# Patient Record
Sex: Male | Born: 1950 | Race: White | Hispanic: No | Marital: Married | Smoking: Former smoker
Health system: Southern US, Community
[De-identification: ages and names within clinical notes are randomized; demographics above are authoritative.]

## PROBLEM LIST (undated history)

## (undated) DIAGNOSIS — I739 Peripheral vascular disease, unspecified: Secondary | ICD-10-CM

## (undated) DIAGNOSIS — C4491 Basal cell carcinoma of skin, unspecified: Secondary | ICD-10-CM

## (undated) DIAGNOSIS — G629 Polyneuropathy, unspecified: Secondary | ICD-10-CM

## (undated) DIAGNOSIS — F32A Depression, unspecified: Secondary | ICD-10-CM

## (undated) DIAGNOSIS — I10 Essential (primary) hypertension: Secondary | ICD-10-CM

## (undated) DIAGNOSIS — F329 Major depressive disorder, single episode, unspecified: Secondary | ICD-10-CM

## (undated) DIAGNOSIS — A419 Sepsis, unspecified organism: Secondary | ICD-10-CM

## (undated) DIAGNOSIS — Z85828 Personal history of other malignant neoplasm of skin: Secondary | ICD-10-CM

## (undated) DIAGNOSIS — C439 Malignant melanoma of skin, unspecified: Secondary | ICD-10-CM

## (undated) DIAGNOSIS — C61 Malignant neoplasm of prostate: Secondary | ICD-10-CM

## (undated) DIAGNOSIS — C4490 Unspecified malignant neoplasm of skin, unspecified: Secondary | ICD-10-CM

## (undated) DIAGNOSIS — R51 Headache: Secondary | ICD-10-CM

## (undated) DIAGNOSIS — G2581 Restless legs syndrome: Secondary | ICD-10-CM

## (undated) DIAGNOSIS — K819 Cholecystitis, unspecified: Secondary | ICD-10-CM

## (undated) DIAGNOSIS — F419 Anxiety disorder, unspecified: Secondary | ICD-10-CM

## (undated) DIAGNOSIS — E785 Hyperlipidemia, unspecified: Secondary | ICD-10-CM

## (undated) DIAGNOSIS — E1151 Type 2 diabetes mellitus with diabetic peripheral angiopathy without gangrene: Secondary | ICD-10-CM

## (undated) DIAGNOSIS — I2510 Atherosclerotic heart disease of native coronary artery without angina pectoris: Secondary | ICD-10-CM

## (undated) DIAGNOSIS — N200 Calculus of kidney: Secondary | ICD-10-CM

## (undated) DIAGNOSIS — E039 Hypothyroidism, unspecified: Secondary | ICD-10-CM

## (undated) DIAGNOSIS — C801 Malignant (primary) neoplasm, unspecified: Secondary | ICD-10-CM

## (undated) HISTORY — DX: Personal history of other malignant neoplasm of skin: Z85.828

## (undated) HISTORY — DX: Hypothyroidism, unspecified: E03.9

## (undated) HISTORY — DX: Essential (primary) hypertension: I10

## (undated) HISTORY — DX: Major depressive disorder, single episode, unspecified: F32.9

## (undated) HISTORY — DX: Peripheral vascular disease, unspecified: I73.9

## (undated) HISTORY — DX: Basal cell carcinoma of skin, unspecified: C44.91

## (undated) HISTORY — DX: Sepsis, unspecified organism: A41.9

## (undated) HISTORY — DX: Depression, unspecified: F32.A

## (undated) HISTORY — DX: Malignant melanoma of skin, unspecified: C43.9

## (undated) HISTORY — DX: Type 2 diabetes mellitus with diabetic peripheral angiopathy without gangrene: E11.51

## (undated) HISTORY — DX: Calculus of kidney: N20.0

## (undated) HISTORY — DX: Hyperlipidemia, unspecified: E78.5

---

## 1995-05-02 HISTORY — PX: EXCISIONAL HEMORRHOIDECTOMY: SHX1541

## 1998-05-01 HISTORY — PX: HEMORROIDECTOMY: SUR656

## 1998-05-01 HISTORY — PX: BASAL CELL CARCINOMA EXCISION: SHX1214

## 2000-06-29 ENCOUNTER — Ambulatory Visit (HOSPITAL_BASED_OUTPATIENT_CLINIC_OR_DEPARTMENT_OTHER): Admission: RE | Admit: 2000-06-29 | Discharge: 2000-06-29 | Payer: Self-pay | Admitting: General Surgery

## 2003-05-05 LAB — HM COLONOSCOPY

## 2006-06-08 ENCOUNTER — Encounter: Admission: RE | Admit: 2006-06-08 | Discharge: 2006-06-08 | Payer: Self-pay | Admitting: *Deleted

## 2011-07-06 ENCOUNTER — Ambulatory Visit (INDEPENDENT_AMBULATORY_CARE_PROVIDER_SITE_OTHER): Payer: Medicare Other | Admitting: Physician Assistant

## 2011-07-06 ENCOUNTER — Encounter: Payer: Self-pay | Admitting: Physician Assistant

## 2011-07-06 ENCOUNTER — Encounter: Payer: Self-pay | Admitting: *Deleted

## 2011-07-06 DIAGNOSIS — R079 Chest pain, unspecified: Secondary | ICD-10-CM

## 2011-07-06 DIAGNOSIS — R9439 Abnormal result of other cardiovascular function study: Secondary | ICD-10-CM

## 2011-07-06 DIAGNOSIS — I10 Essential (primary) hypertension: Secondary | ICD-10-CM

## 2011-07-06 LAB — PROTIME-INR: INR: 0.9 ratio (ref 0.8–1.0)

## 2011-07-06 LAB — BASIC METABOLIC PANEL
BUN: 18 mg/dL (ref 6–23)
Calcium: 9.1 mg/dL (ref 8.4–10.5)
Chloride: 101 mEq/L (ref 96–112)
Creatinine, Ser: 1.7 mg/dL — ABNORMAL HIGH (ref 0.4–1.5)
Glucose, Bld: 111 mg/dL — ABNORMAL HIGH (ref 70–99)

## 2011-07-06 MED ORDER — NITROGLYCERIN 0.4 MG SL SUBL: 0.4000 mg | SUBLINGUAL_TABLET | SUBLINGUAL | Status: DC | PRN

## 2011-07-06 MED ORDER — METOPROLOL TARTRATE 25 MG PO TABS: 25.0000 mg | ORAL_TABLET | Freq: Two times a day (BID) | ORAL | Status: DC

## 2011-07-06 NOTE — Progress Notes (Addendum)
[address] [place], [place]  [address] Phone: [phone] Fax:  [phone]  Date:  07/06/2011   Name:  Dean Cole       DOB:  05/27/1950 MRN:  [id number]  PCP:  Dr. Arthur Green Primary Cardiologist:  New to Dr.  Thomas Stuckey  Primary Electrophysiologist:  None    History of Present Illness: Dean Cole is a 60 y.o. male who is referred for ETT by his PCP.  He has a h/o HTN, hypothyroidism, borderline diabetes.  He is an ex-smoker.  FHx notable for CAD with father having "stents" put in in his 60s.  Notes chest pain for years.  Has had chest pain and dyspnea with exertion for the last couple of months.  No syncope.  No orthopnea, PND or edema.     Past Medical History  Diagnosis Date  . HTN (hypertension)   . Hypothyroidism   . Borderline diabetes     Current Outpatient Prescriptions  Medication Sig Dispense Refill  . aspirin 81 MG tablet Take 81 mg by mouth daily.      . diazepam (VALIUM) 5 MG tablet Take 5 mg by mouth every 6 (six) hours as needed.      . imipramine (TOFRANIL) 25 MG tablet Take 75 mg by mouth at bedtime.      . levothyroxine (SYNTHROID, LEVOTHROID) 150 MCG tablet Take 150 mcg by mouth daily.      . pentazocine-naloxone (TALWIN NX) 50-0.5 MG per tablet Take 1 tablet by mouth every 6 (six) hours as needed.      . telmisartan-hydrochlorothiazide (MICARDIS HCT) 80-12.5 MG per tablet Take 1 tablet by mouth daily.      . terazosin (HYTRIN) 5 MG capsule Take 5 mg by mouth daily.         Allergies: Allergies no known allergies   History  Substance Use Topics  . Smoking status: Former Smoker  . Smokeless tobacco: Not on file  . Alcohol Use: Yes     "mixed drink every now and then"     Family History  Problem Relation Age of Onset  . Coronary artery disease Father   . Coronary artery disease Paternal Grandfather      ROS:  Please see the history of present illness.    All other systems reviewed and negative.    PHYSICAL EXAM: VS:  BP 102/67  Pulse 110  Resp 12 Well nourished, well developed, in no acute distress HEENT: normal Neck: no JVD at 90 degrees Vascular: no carotid bruits Cardiac:  normal S1, S2; RRR; no murmur Lungs:  clear to auscultation bilaterally, no wheezing, rhonchi or rales Abd: soft, nontender  Ext: no edema Skin: warm and dry Neuro:  CNs 2-12 intact, no focal abnormalities noted Psych: normal affect  EKG:  Sinus tach, HR 110, norma axis, no acute changes   Exercise Treadmill Test  Pre-Exercise Testing Evaluation Rhythm: sinus tachycardia  Rate:110 PR:  .15 QRS:  .09 QT:  .33 QTc: .45   Test  Exercise Tolerance Test Ordering MD: Arthur Green, MD  Interpreting MD:  Samyrah Bruster PA-C Unique Test No: 1  Treadmill:  1 Indication for ETT: chest pain - rule out ischemia  Contraindication to ETT: No  Stress Modality: exercise - treadmill  Cardiac Imaging Performed: non  Protocol: standard Bruce - maximal  Max BP:  134/48 Max MPHR (bpm):  160 85% MPR (bpm):  136 MPHR obtained (bpm):  146 % MPHR obtained:  91%   Reached 85% MPHR (min:sec):  3:50 Total Exercise Time (min-sec):  7:00 Workload in METS:  8.5 Borg Scale: 15 Reason ETT Terminated:  desired heart rate attained   ST Segment Analysis At Rest: normal ST segments - no evidence of significant ST depression With Exercise: significant ischemic ST depression  Other Information Arrhythmia:  No Angina during ETT:  present (1) Quality of ETT:  diagnostic  ETT Interpretation:  abnormal - evidence of ST depression consistent with ischemia  Comments: Good exercise tolerance. He did complain of chest pain. Normal BP response to exercise. Significant ST depression (3mm) consistent with ischemia.   Recommendations: Recommend cardiac catheterization.   ASSESSMENT AND PLAN:  1. Chest pain  He has significant ST segment depression indicative of ischemia.  Likelihood of obstructive CAD is high.  Symptoms  stable.  Discussed with Dr.  Thomas Stuckey (DOD).  We recommend cardiac catheterization.  The procedure was explained in detail to the patient.  Risks and benefits of cardiac catheterization have been discussed with the patient.  These include bleeding, infection, kidney damage, stroke, heart attack, death.  The patient understands these risks and is willing to proceed.  He will continue ASA.  Stop Hytrin.  Start Metoprolol 25 mg bid.  I will give him a Rx for NTG prn.  The procedure will be arranged for tomorrow.  He knows to call 911 if he has unstable symptoms.   2. Abnormal stress test  Arrange cardiac catheterization as noted above.   3. HTN (hypertension)  Stop Hytrin and add metoprolol as noted.     Signed, Hani Patnode, PA-C  12:43 PM 07/06/2011    

## 2011-07-06 NOTE — Patient Instructions (Addendum)
Your physician has requested that you have a cardiac catheterization LEFT HEART CATH 07/07/11 WITH DR. Clifton James @ 10:30 AM. Cardiac catheterization is used to diagnose and/or treat various heart conditions. Doctors may recommend this procedure for a number of different reasons. The most common reason is to evaluate chest pain. Chest pain can be a symptom of coronary artery disease (CAD), and cardiac catheterization can show whether plaque is narrowing or blocking your heart's arteries. This procedure is also used to evaluate the valves, as well as measure the blood flow and oxygen levels in different parts of your heart. For further information please visit [email or web address] Please follow instruction sheet, as given.  Your physician recommends that you return for lab work in: STAT PRE CATH LABS BMET, CB W/DIFF, PT/INR 786.50  Your physician has recommended you make the following change in your medication: STOP HYTRIN;  START METOPROLOL 25 MG 1 TABLET TWICE DAILY A PRESCRIPTION HAS BEEN SENT TO THE PHARMACY TODAY FOR YOU

## 2011-07-06 NOTE — H&P (Signed)
History and Physical   Date: 07/06/2011   Name: Dean Cole  DOB: [birth year]  MRN: [id number]   PCP: Dr. Murray Hodgkins  Primary Cardiologist: New to Dr. Shawnie Pons  Primary Electrophysiologist: None   History of Present Illness:  Dean Cole is a 61 y.o. male who is referred for ETT by his PCP. He has a h/o HTN, hypothyroidism, borderline diabetes. He is an ex-smoker. FHx notable for CAD with father having "stents" put in in his 21s. Notes chest pain for years. Has had chest pain and dyspnea with exertion for the last couple of months. No syncope. No orthopnea, PND or edema.    Past Medical History   Diagnosis  Date   .  HTN (hypertension)    .  Hypothyroidism    .  Borderline diabetes      Current Outpatient Prescriptions   Medication  Sig  Dispense  Refill   .  aspirin 81 MG tablet  Take 81 mg by mouth daily.     .  diazepam (VALIUM) 5 MG tablet  Take 5 mg by mouth every 6 (six) hours as needed.     Marland Kitchen  imipramine (TOFRANIL) 25 MG tablet  Take 75 mg by mouth at bedtime.     Marland Kitchen  levothyroxine (SYNTHROID, LEVOTHROID) 150 MCG tablet  Take 150 mcg by mouth daily.     .  pentazocine-naloxone (TALWIN NX) 50-0.5 MG per tablet  Take 1 tablet by mouth every 6 (six) hours as needed.     Marland Kitchen  telmisartan-hydrochlorothiazide (MICARDIS HCT) 80-12.5 MG per tablet  Take 1 tablet by mouth daily.     Marland Kitchen  terazosin (HYTRIN) 5 MG capsule  Take 5 mg by mouth daily.       Allergies:  Allergies no known allergies    History   Substance Use Topics   .  Smoking status:  Former Games developer   .  Smokeless tobacco:  Not on file   .  Alcohol Use:  Yes      "mixed drink every now and then"     Family History   Problem  Relation  Age of Onset   .  Coronary artery disease  Father    .  Coronary artery disease  Paternal Grandfather      ROS: Please see the history of present illness. All other systems reviewed and negative.   PHYSICAL EXAM:  VS: BP 102/67  Pulse 110  Resp 12  Well  nourished, well developed, in no acute distress  HEENT: normal  Neck: no JVD at 90 degrees  Vascular: no carotid bruits  Cardiac: normal S1, S2; RRR; no murmur  Lungs: clear to auscultation bilaterally, no wheezing, rhonchi or rales  Abd: soft, nontender  Ext: no edema  Skin: warm and dry  Neuro: CNs 2-12 intact, no focal abnormalities noted  Psych: normal affect   EKG: Sinus tach, HR 110, norma axis, no acute changes   Exercise Treadmill Test  Pre-Exercise Testing Evaluation  Rhythm: sinus tachycardia Rate:110  PR: .15 QRS: .09  QT: .33 QTc: .45   Test   Exercise Tolerance Test  Ordering MD: Murray Hodgkins, MD Interpreting MD: Tereso Newcomer PA-C  Unique Test No: 1 Treadmill: 1  Indication for ETT: chest pain - rule out ischemia Contraindication to ETT: No  Stress Modality: exercise - treadmill Cardiac Imaging Performed: non  Protocol: standard Bruce - maximal Max BP: 134/48  Max MPHR (bpm): 160 85% MPR (bpm): 136  MPHR  obtained (bpm): 146 % MPHR obtained: 91%  Reached 85% MPHR (min:sec): 3:50 Total Exercise Time (min-sec): 7:00  Workload in METS: 8.5 Borg Scale: 15  Reason ETT Terminated: desired heart rate attained   ST Segment Analysis  At Rest: normal ST segments - no evidence of significant ST depression  With Exercise: significant ischemic ST depression   Other Information  Arrhythmia: No Angina during ETT: present (1)   Quality of ETT: diagnostic   ETT Interpretation: abnormal - evidence of ST depression consistent with ischemia   Comments:  Good exercise tolerance.  He did complain of chest pain.  Normal BP response to exercise.  Significant ST depression consistent with ischemia.   Recommendations:  Recommend cardiac catheterization.    ASSESSMENT AND PLAN:  1.  Chest pain  He has significant ST segment depression indicative of ischemia. Likelihood of obstructive CAD is high. Symptoms stable. Discussed with Dr. Shawnie Pons (DOD). We recommend cardiac  catheterization. The procedure was explained in detail to the patient. Risks and benefits of cardiac catheterization have been discussed with the patient. These include bleeding, infection, kidney damage, stroke, heart attack, death. The patient understands these risks and is willing to proceed. He will continue ASA. Stop Hytrin. Start Metoprolol 25 mg bid. I will give him a Rx for NTG prn. The procedure will be arranged for tomorrow. He knows to call 911 if he has unstable symptoms.    2.  Abnormal stress test  Arrange cardiac catheterization as noted above.   3.  HTN (hypertension)  Stop Hytrin and add metoprolol as noted.     Signed,  Tereso Newcomer, PA-C 12:43 PM 07/06/2011   I have reviewed this H and P today. I have personally examined the patient here in the cath lab.  I agree with plans as outlined.   Neema Barreira 12:36 PM 07/07/2011

## 2011-07-07 ENCOUNTER — Inpatient Hospital Stay (HOSPITAL_COMMUNITY): Payer: Medicare Other

## 2011-07-07 ENCOUNTER — Other Ambulatory Visit: Payer: Self-pay

## 2011-07-07 ENCOUNTER — Encounter (HOSPITAL_COMMUNITY): Payer: Self-pay | Admitting: General Practice

## 2011-07-07 ENCOUNTER — Encounter (HOSPITAL_COMMUNITY)
Admission: RE | Disposition: A | Discharge status: Home / Self Care | Payer: Self-pay | Source: Ambulatory Visit | Attending: Cardiothoracic Surgery

## 2011-07-07 ENCOUNTER — Inpatient Hospital Stay (HOSPITAL_COMMUNITY)
Admission: RE | Admit: 2011-07-07 | Discharge: 2011-07-15 | DRG: 234 | Disposition: A | Discharge status: Home / Self Care | Payer: Medicare Other | Source: Ambulatory Visit | Attending: Cardiothoracic Surgery | Admitting: Cardiothoracic Surgery

## 2011-07-07 DIAGNOSIS — E669 Obesity, unspecified: Secondary | ICD-10-CM | POA: Diagnosis present

## 2011-07-07 DIAGNOSIS — Z79899 Other long term (current) drug therapy: Secondary | ICD-10-CM

## 2011-07-07 DIAGNOSIS — I2510 Atherosclerotic heart disease of native coronary artery without angina pectoris: Secondary | ICD-10-CM

## 2011-07-07 DIAGNOSIS — E039 Hypothyroidism, unspecified: Secondary | ICD-10-CM | POA: Diagnosis present

## 2011-07-07 DIAGNOSIS — N289 Disorder of kidney and ureter, unspecified: Secondary | ICD-10-CM | POA: Diagnosis not present

## 2011-07-07 DIAGNOSIS — Z8249 Family history of ischemic heart disease and other diseases of the circulatory system: Secondary | ICD-10-CM

## 2011-07-07 DIAGNOSIS — I471 Supraventricular tachycardia, unspecified: Secondary | ICD-10-CM | POA: Diagnosis not present

## 2011-07-07 DIAGNOSIS — E785 Hyperlipidemia, unspecified: Secondary | ICD-10-CM | POA: Diagnosis present

## 2011-07-07 DIAGNOSIS — I200 Unstable angina: Secondary | ICD-10-CM | POA: Diagnosis present

## 2011-07-07 DIAGNOSIS — Y832 Surgical operation with anastomosis, bypass or graft as the cause of abnormal reaction of the patient, or of later complication, without mention of misadventure at the time of the procedure: Secondary | ICD-10-CM | POA: Diagnosis not present

## 2011-07-07 DIAGNOSIS — G2581 Restless legs syndrome: Secondary | ICD-10-CM | POA: Diagnosis present

## 2011-07-07 DIAGNOSIS — Z7982 Long term (current) use of aspirin: Secondary | ICD-10-CM

## 2011-07-07 DIAGNOSIS — E119 Type 2 diabetes mellitus without complications: Secondary | ICD-10-CM | POA: Diagnosis present

## 2011-07-07 DIAGNOSIS — K929 Disease of digestive system, unspecified: Secondary | ICD-10-CM | POA: Diagnosis not present

## 2011-07-07 DIAGNOSIS — E8779 Other fluid overload: Secondary | ICD-10-CM | POA: Diagnosis not present

## 2011-07-07 DIAGNOSIS — K560 Paralytic ileus: Secondary | ICD-10-CM | POA: Diagnosis not present

## 2011-07-07 DIAGNOSIS — K219 Gastro-esophageal reflux disease without esophagitis: Secondary | ICD-10-CM | POA: Diagnosis present

## 2011-07-07 DIAGNOSIS — Z87891 Personal history of nicotine dependence: Secondary | ICD-10-CM

## 2011-07-07 DIAGNOSIS — D696 Thrombocytopenia, unspecified: Secondary | ICD-10-CM | POA: Diagnosis not present

## 2011-07-07 DIAGNOSIS — D62 Acute posthemorrhagic anemia: Secondary | ICD-10-CM | POA: Diagnosis not present

## 2011-07-07 DIAGNOSIS — I10 Essential (primary) hypertension: Secondary | ICD-10-CM | POA: Diagnosis present

## 2011-07-07 DIAGNOSIS — Y9210 Unspecified residential institution as the place of occurrence of the external cause: Secondary | ICD-10-CM | POA: Diagnosis not present

## 2011-07-07 DIAGNOSIS — K5900 Constipation, unspecified: Secondary | ICD-10-CM | POA: Diagnosis not present

## 2011-07-07 DIAGNOSIS — I119 Hypertensive heart disease without heart failure: Secondary | ICD-10-CM | POA: Insufficient documentation

## 2011-07-07 HISTORY — PX: LEFT HEART CATHETERIZATION WITH CORONARY ANGIOGRAM: SHX5451

## 2011-07-07 HISTORY — DX: Atherosclerotic heart disease of native coronary artery without angina pectoris: I25.10

## 2011-07-07 HISTORY — DX: Headache: R51

## 2011-07-07 HISTORY — DX: Malignant (primary) neoplasm, unspecified: C80.1

## 2011-07-07 HISTORY — PX: CARDIAC CATHETERIZATION: SHX172

## 2011-07-07 HISTORY — DX: Restless legs syndrome: G25.81

## 2011-07-07 LAB — PULMONARY FUNCTION TEST

## 2011-07-07 LAB — BASIC METABOLIC PANEL
CO2: 27 mEq/L (ref 19–32)
Calcium: 8 mg/dL — ABNORMAL LOW (ref 8.4–10.5)
Chloride: 108 mEq/L (ref 96–112)
Glucose, Bld: 105 mg/dL — ABNORMAL HIGH (ref 70–99)
Sodium: 141 mEq/L (ref 135–145)

## 2011-07-07 LAB — CBC
HCT: 35.6 % — ABNORMAL LOW (ref 39.0–52.0)
Hemoglobin: 12.1 g/dL — ABNORMAL LOW (ref 13.0–17.0)
MCH: 30.7 pg (ref 26.0–34.0)
MCV: 90.4 fL (ref 78.0–100.0)
RBC: 3.94 MIL/uL — ABNORMAL LOW (ref 4.22–5.81)

## 2011-07-07 LAB — GLUCOSE, CAPILLARY: Glucose-Capillary: 106 mg/dL — ABNORMAL HIGH (ref 70–99)

## 2011-07-07 LAB — POCT ACTIVATED CLOTTING TIME: Activated Clotting Time: 144 seconds

## 2011-07-07 SURGERY — LEFT HEART CATHETERIZATION WITH CORONARY ANGIOGRAM
Anesthesia: LOCAL

## 2011-07-07 MED ORDER — ASPIRIN 81 MG PO TABS: 81.0000 mg | ORAL_TABLET | Freq: Every day | ORAL | Status: DC

## 2011-07-07 MED ORDER — HEPARIN SODIUM (PORCINE) 1000 UNIT/ML IJ SOLN
INTRAMUSCULAR | Status: AC
  Filled 2011-07-07: qty 1

## 2011-07-07 MED ORDER — ACETAMINOPHEN 325 MG PO TABS
650.0000 mg | ORAL_TABLET | ORAL | Status: DC | PRN
  Administered 2011-07-08: 650 mg via ORAL
  Filled 2011-07-07: qty 2

## 2011-07-07 MED ORDER — MORPHINE SULFATE 2 MG/ML IJ SOLN: 1.0000 mg | INTRAMUSCULAR | Status: DC | PRN

## 2011-07-07 MED ORDER — METOPROLOL TARTRATE 25 MG PO TABS
25.0000 mg | ORAL_TABLET | Freq: Two times a day (BID) | ORAL | Status: DC
  Administered 2011-07-07 – 2011-07-09 (×5): 25 mg via ORAL
  Filled 2011-07-07 (×8): qty 1

## 2011-07-07 MED ORDER — ASPIRIN 81 MG PO CHEW
CHEWABLE_TABLET | ORAL | Status: AC
  Filled 2011-07-07: qty 4

## 2011-07-07 MED ORDER — SODIUM CHLORIDE 0.9 % IJ SOLN: 3.0000 mL | Freq: Two times a day (BID) | INTRAMUSCULAR | Status: DC

## 2011-07-07 MED ORDER — OXYCODONE-ACETAMINOPHEN 5-325 MG PO TABS: 1.0000 | ORAL_TABLET | ORAL | Status: DC | PRN

## 2011-07-07 MED ORDER — MIDAZOLAM HCL 2 MG/2ML IJ SOLN
INTRAMUSCULAR | Status: AC
  Filled 2011-07-07: qty 2

## 2011-07-07 MED ORDER — ASPIRIN 81 MG PO CHEW
324.0000 mg | CHEWABLE_TABLET | ORAL | Status: DC
  Administered 2011-07-07: 324 mg via ORAL

## 2011-07-07 MED ORDER — LEVOTHYROXINE SODIUM 150 MCG PO TABS
150.0000 ug | ORAL_TABLET | Freq: Every day | ORAL | Status: DC
  Administered 2011-07-08 – 2011-07-15 (×7): 150 ug via ORAL
  Filled 2011-07-07 (×10): qty 1

## 2011-07-07 MED ORDER — NITROGLYCERIN 0.2 MG/ML ON CALL CATH LAB
INTRAVENOUS | Status: AC
  Filled 2011-07-07: qty 1

## 2011-07-07 MED ORDER — ONDANSETRON HCL 4 MG/2ML IJ SOLN: 4.0000 mg | Freq: Four times a day (QID) | INTRAMUSCULAR | Status: DC | PRN

## 2011-07-07 MED ORDER — SODIUM CHLORIDE 0.9 % IV SOLN
INTRAVENOUS | Status: DC
  Administered 2011-07-07: 09:00:00 via INTRAVENOUS

## 2011-07-07 MED ORDER — IRBESARTAN 300 MG PO TABS
300.0000 mg | ORAL_TABLET | Freq: Every day | ORAL | Status: DC
  Administered 2011-07-08 – 2011-07-09 (×2): 300 mg via ORAL
  Filled 2011-07-07 (×3): qty 1

## 2011-07-07 MED ORDER — TELMISARTAN-HCTZ 80-12.5 MG PO TABS: 1.0000 | ORAL_TABLET | Freq: Every day | ORAL | Status: DC

## 2011-07-07 MED ORDER — NITROGLYCERIN 0.4 MG SL SUBL: 0.4000 mg | SUBLINGUAL_TABLET | SUBLINGUAL | Status: DC | PRN

## 2011-07-07 MED ORDER — POLYETHYLENE GLYCOL 3350 17 G PO PACK
17.0000 g | PACK | Freq: Every day | ORAL | Status: DC | PRN
  Administered 2011-07-09: 17 g via ORAL
  Filled 2011-07-07 (×4): qty 1

## 2011-07-07 MED ORDER — ATORVASTATIN CALCIUM 80 MG PO TABS
80.0000 mg | ORAL_TABLET | Freq: Every day | ORAL | Status: DC
  Administered 2011-07-07 – 2011-07-11 (×5): 80 mg via ORAL
  Filled 2011-07-07 (×7): qty 1

## 2011-07-07 MED ORDER — IMIPRAMINE HCL 50 MG PO TABS
75.0000 mg | ORAL_TABLET | Freq: Every day | ORAL | Status: DC
  Filled 2011-07-07: qty 1

## 2011-07-07 MED ORDER — PENTAZOCINE-NALOXONE 50-0.5 MG PO TABS: 1.0000 | ORAL_TABLET | Freq: Two times a day (BID) | ORAL | Status: DC

## 2011-07-07 MED ORDER — DIAZEPAM 5 MG PO TABS
5.0000 mg | ORAL_TABLET | Freq: Four times a day (QID) | ORAL | Status: DC
  Administered 2011-07-07 – 2011-07-09 (×6): 5 mg via ORAL
  Filled 2011-07-07 (×6): qty 1

## 2011-07-07 MED ORDER — FENTANYL CITRATE 0.05 MG/ML IJ SOLN
INTRAMUSCULAR | Status: AC
  Filled 2011-07-07: qty 2

## 2011-07-07 MED ORDER — SODIUM CHLORIDE 0.9 % IJ SOLN: 3.0000 mL | INTRAMUSCULAR | Status: DC | PRN

## 2011-07-07 MED ORDER — ASPIRIN EC 81 MG PO TBEC
81.0000 mg | DELAYED_RELEASE_TABLET | Freq: Every day | ORAL | Status: DC
  Administered 2011-07-08 – 2011-07-09 (×2): 81 mg via ORAL
  Filled 2011-07-07 (×3): qty 1

## 2011-07-07 MED ORDER — HEPARIN (PORCINE) IN NACL 2-0.9 UNIT/ML-% IJ SOLN
INTRAMUSCULAR | Status: AC
  Filled 2011-07-07: qty 2000

## 2011-07-07 MED ORDER — SODIUM CHLORIDE 0.9 % IV SOLN: INTRAVENOUS | Status: AC

## 2011-07-07 MED ORDER — PENTAZOCINE-NALOXONE HCL 50-0.5 MG PO TABS
1.0000 | ORAL_TABLET | Freq: Two times a day (BID) | ORAL | Status: DC
  Administered 2011-07-07 – 2011-07-09 (×5): 1 via ORAL
  Filled 2011-07-07 (×5): qty 1

## 2011-07-07 MED ORDER — HYDROCHLOROTHIAZIDE 12.5 MG PO CAPS
12.5000 mg | ORAL_CAPSULE | Freq: Every day | ORAL | Status: DC
  Administered 2011-07-08 – 2011-07-09 (×2): 12.5 mg via ORAL
  Filled 2011-07-07 (×3): qty 1

## 2011-07-07 MED ORDER — LIDOCAINE HCL (PF) 1 % IJ SOLN
INTRAMUSCULAR | Status: AC
  Filled 2011-07-07: qty 30

## 2011-07-07 MED ORDER — SODIUM CHLORIDE 0.9 % IV SOLN: 250.0000 mL | INTRAVENOUS | Status: DC | PRN

## 2011-07-07 NOTE — H&P (View-Only) (Signed)
[address] [place], [place]  [address] Phone: [phone] Fax:  [phone]  Date:  07/06/2011   Name:  Dean Cole       DOB:  [birth year] MRN:  [id number]  PCP:  Dr. Murray Hodgkins Primary Cardiologist:  New to Dr.  Shawnie Pons  Primary Electrophysiologist:  None    History of Present Illness: Dean Cole is a 61 y.o. male who is referred for ETT by his PCP.  He has a h/o HTN, hypothyroidism, borderline diabetes.  He is an ex-smoker.  FHx notable for CAD with father having "stents" put in in his 61s.  Notes chest pain for years.  Has had chest pain and dyspnea with exertion for the last couple of months.  No syncope.  No orthopnea, PND or edema.     Past Medical History  Diagnosis Date  . HTN (hypertension)   . Hypothyroidism   . Borderline diabetes     Current Outpatient Prescriptions  Medication Sig Dispense Refill  . aspirin 81 MG tablet Take 81 mg by mouth daily.      . diazepam (VALIUM) 5 MG tablet Take 5 mg by mouth every 6 (six) hours as needed.      Marland Kitchen imipramine (TOFRANIL) 25 MG tablet Take 75 mg by mouth at bedtime.      Marland Kitchen levothyroxine (SYNTHROID, LEVOTHROID) 150 MCG tablet Take 150 mcg by mouth daily.      . pentazocine-naloxone (TALWIN NX) 50-0.5 MG per tablet Take 1 tablet by mouth every 6 (six) hours as needed.      Marland Kitchen telmisartan-hydrochlorothiazide (MICARDIS HCT) 80-12.5 MG per tablet Take 1 tablet by mouth daily.      Marland Kitchen terazosin (HYTRIN) 5 MG capsule Take 5 mg by mouth daily.         Allergies: Allergies no known allergies   History  Substance Use Topics  . Smoking status: Former Games developer  . Smokeless tobacco: Not on file  . Alcohol Use: Yes     "mixed drink every now and then"     Family History  Problem Relation Age of Onset  . Coronary artery disease Father   . Coronary artery disease Paternal Grandfather      ROS:  Please see the history of present illness.    All other systems reviewed and negative.    PHYSICAL EXAM: VS:  BP 102/67  Pulse 110  Resp 12 Well nourished, well developed, in no acute distress HEENT: normal Neck: no JVD at 90 degrees Vascular: no carotid bruits Cardiac:  normal S1, S2; RRR; no murmur Lungs:  clear to auscultation bilaterally, no wheezing, rhonchi or rales Abd: soft, nontender  Ext: no edema Skin: warm and dry Neuro:  CNs 2-12 intact, no focal abnormalities noted Psych: normal affect  EKG:  Sinus tach, HR 110, norma axis, no acute changes   Exercise Treadmill Test  Pre-Exercise Testing Evaluation Rhythm: sinus tachycardia  Rate:110 PR:  .15 QRS:  .09 QT:  .33 QTc: .45   Test  Exercise Tolerance Test Ordering MD: Murray Hodgkins, MD  Interpreting MD:  Tereso Newcomer PA-C Unique Test No: 1  Treadmill:  1 Indication for ETT: chest pain - rule out ischemia  Contraindication to ETT: No  Stress Modality: exercise - treadmill  Cardiac Imaging Performed: non  Protocol: standard Bruce - maximal  Max BP:  134/48 Max MPHR (bpm):  160 85% MPR (bpm):  136 MPHR obtained (bpm):  146 % MPHR obtained:  91%  Reached 85% MPHR (min:sec):  3:50 Total Exercise Time (min-sec):  7:00 Workload in METS:  8.5 Borg Scale: 15 Reason ETT Terminated:  desired heart rate attained   ST Segment Analysis At Rest: normal ST segments - no evidence of significant ST depression With Exercise: significant ischemic ST depression  Other Information Arrhythmia:  No Angina during ETT:  present (1) Quality of ETT:  diagnostic  ETT Interpretation:  abnormal - evidence of ST depression consistent with ischemia  Comments: Good exercise tolerance. He did complain of chest pain. Normal BP response to exercise. Significant ST depression (3mm) consistent with ischemia.   Recommendations: Recommend cardiac catheterization.   ASSESSMENT AND PLAN:  1. Chest pain  He has significant ST segment depression indicative of ischemia.  Likelihood of obstructive CAD is high.  Symptoms  stable.  Discussed with Dr.  Shawnie Pons (DOD).  We recommend cardiac catheterization.  The procedure was explained in detail to the patient.  Risks and benefits of cardiac catheterization have been discussed with the patient.  These include bleeding, infection, kidney damage, stroke, heart attack, death.  The patient understands these risks and is willing to proceed.  He will continue ASA.  Stop Hytrin.  Start Metoprolol 25 mg bid.  I will give him a Rx for NTG prn.  The procedure will be arranged for tomorrow.  He knows to call 911 if he has unstable symptoms.   2. Abnormal stress test  Arrange cardiac catheterization as noted above.   3. HTN (hypertension)  Stop Hytrin and add metoprolol as noted.     [name], PA-C  12:43 PM 07/06/2011

## 2011-07-07 NOTE — CV Procedure (Signed)
Cardiac Catheterization Operative Report  RECO SHONK [id number] 3/8/201312:17 PM GREEN, Lenon Curt, MD, MD  Procedure Performed:  1. Left Heart Catheterization 2. Selective Coronary Angiography 3. Left ventricular angiogram  Operator: Verne Carrow, MD  Arterial access site:  Right radial artery and right femoral artery. Could not engage left main from the radial access so we had to switch to femoral access.   Indication:  Unstable angina.                                      Procedure Details: The risks, benefits, complications, treatment options, and expected outcomes were discussed with the patient. The patient and/or family concurred with the proposed plan, giving informed consent. The patient was brought to the cath lab after IV hydration was begun and oral premedication was given. The patient was further sedated with Versed and Fentanyl. The right wrist was assessed with an Allens test which was positive. The right wrist was prepped and draped in a sterile fashion. 1% lidocaine was used for local anesthesia. Using the modified Seldinger access technique, a 5 French sheath was placed in the right radial artery. 3 mg Verapamil was given through the sheath. 4500 units IV heparin was given. A JR-4 catheter was used to engage the RCA. I was unable to engage the left main artery from the radial approach. I used a JL-3.5, TIG 4.0, EBU 3.5, Jacky, E rad left with no success. I then elected to gain access from the right femoral artery. The right groin had been prepped. 1% lidocaine was used for local anesthesia. A 5 French sheath was placed in the right femoral artery. I then used a JL-4 catheter to engage the left main artery.  A pigtail catheter was used to perform a left ventricular angiogram. The sheath was removed from the right radial artery and a Terumo hemostasis band was applied at the arteriotomy site on the right wrist.    There were no immediate complications. The  patient was taken to the recovery area in stable condition.   Hemodynamic Findings: Central aortic pressure: 122/74 Left ventricular pressure: 137/16/19  Angiographic Findings:  Left main:  Distal left main has a hazy, eccentric 90% stenosis.   Left Anterior Descending Artery: Large vessel that courses to the apex. Mild non-obstructive plaque throughout the proximal and mid vessel. The diagonal is moderate sized with mild plaque disease.   Circumflex Artery: Small to moderate sized vessel with ostial 70% stenosis. This supplies a moderate sized OM branch that has mild plaque disease.   Right Coronary Artery: Large dominant vessel with 99% mid stenosis. There is a long, tubular distal 90% stenosis. Just before a smaller caliber PDA branch there is a 70% stenosis. The PLA supplies a territory and has mild plaque disease.   Left Ventricular Angiogram: LVEF 50-55%. The inferior wall has mild hypokinesis.   Impression: 1.  Severe distal left main stenosis. 2. Severe disease mid and distal RCA 3. Preserved LV systolic function 4. Exertional angina  Recommendations: Will admit pt and consult CT surgery for possible CABG. I think bypass surgery will be the best revascularization strategy given his anatomy with distal left main stenosis, ostial Circumflex disease and severe RCA disease in several segments. Will continue ASA/beta blocker. Will start statin.        Complications:  None. The patient tolerated the procedure well.

## 2011-07-07 NOTE — Interval H&P Note (Signed)
History and Physical Interval Note:  07/07/2011 10:43 AM  Dean Cole  has presented today for cardiac cath with the diagnosis of Abnormal Stress Test  The various methods of treatment have been discussed with the patient and family. After consideration of risks, benefits and other options for treatment, the patient has consented to  Procedure(s) (LRB): LEFT HEART CATHETERIZATION WITH CORONARY ANGIOGRAM (N/A) as a surgical intervention .  The patients' history has been reviewed, patient examined, no change in status, stable for surgery.  I have reviewed the patients' chart and labs.  Questions were answered to the patient's satisfaction.     Brunella Wileman

## 2011-07-07 NOTE — Progress Notes (Signed)
Patient with class III angina,L main and 3 vessel CAD Full consult dictated # [phone] CABG Mon am Procedure risks and benefits d/w patient,wife  P. Zenaida Niece Trigt,MD

## 2011-07-07 NOTE — Progress Notes (Signed)
Contacted Lucile Crater, [place] regarding pt medications.  He told me he takes Tofranil 75mg  q HS and miralax q weekly.  Orders were received.

## 2011-07-08 ENCOUNTER — Inpatient Hospital Stay (HOSPITAL_COMMUNITY): Payer: Medicare Other

## 2011-07-08 ENCOUNTER — Encounter (HOSPITAL_COMMUNITY): Payer: Self-pay | Admitting: Nurse Practitioner

## 2011-07-08 DIAGNOSIS — I200 Unstable angina: Secondary | ICD-10-CM

## 2011-07-08 DIAGNOSIS — E039 Hypothyroidism, unspecified: Secondary | ICD-10-CM | POA: Insufficient documentation

## 2011-07-08 DIAGNOSIS — I319 Disease of pericardium, unspecified: Secondary | ICD-10-CM

## 2011-07-08 DIAGNOSIS — I2510 Atherosclerotic heart disease of native coronary artery without angina pectoris: Secondary | ICD-10-CM | POA: Insufficient documentation

## 2011-07-08 DIAGNOSIS — C4491 Basal cell carcinoma of skin, unspecified: Secondary | ICD-10-CM | POA: Insufficient documentation

## 2011-07-08 DIAGNOSIS — R079 Chest pain, unspecified: Secondary | ICD-10-CM

## 2011-07-08 DIAGNOSIS — I119 Hypertensive heart disease without heart failure: Secondary | ICD-10-CM | POA: Insufficient documentation

## 2011-07-08 DIAGNOSIS — G2581 Restless legs syndrome: Secondary | ICD-10-CM | POA: Insufficient documentation

## 2011-07-08 LAB — URINALYSIS, ROUTINE W REFLEX MICROSCOPIC
Bilirubin Urine: NEGATIVE
Glucose, UA: NEGATIVE mg/dL
Hgb urine dipstick: NEGATIVE
Ketones, ur: NEGATIVE mg/dL
Leukocytes, UA: NEGATIVE
Nitrite: NEGATIVE
Protein, ur: NEGATIVE mg/dL
Specific Gravity, Urine: 1.023 (ref 1.005–1.030)
Urobilinogen, UA: 1 mg/dL (ref 0.0–1.0)
pH: 6.5 (ref 5.0–8.0)

## 2011-07-08 LAB — SURGICAL PCR SCREEN
MRSA, PCR: NEGATIVE
Staphylococcus aureus: NEGATIVE

## 2011-07-08 LAB — CBC
MCH: 31.2 pg (ref 26.0–34.0)
Platelets: 143 10*3/uL — ABNORMAL LOW (ref 150–400)
RBC: 4.46 MIL/uL (ref 4.22–5.81)
RDW: 13 % (ref 11.5–15.5)

## 2011-07-08 LAB — BASIC METABOLIC PANEL
CO2: 29 mEq/L (ref 19–32)
Calcium: 9.2 mg/dL (ref 8.4–10.5)
GFR calc Af Amer: 59 mL/min — ABNORMAL LOW (ref >90)
GFR calc non Af Amer: 51 mL/min — ABNORMAL LOW (ref >90)
Sodium: 135 mEq/L (ref 135–145)

## 2011-07-08 LAB — HEMOGLOBIN A1C
Hgb A1c MFr Bld: 5.6 % (ref <5.7)
Mean Plasma Glucose: 114 mg/dL (ref <117)

## 2011-07-08 MED ORDER — CHLORHEXIDINE GLUCONATE 4 % EX LIQD: 60.0000 mL | Freq: Once | CUTANEOUS | Status: DC

## 2011-07-08 MED ORDER — BISACODYL 5 MG PO TBEC
5.0000 mg | DELAYED_RELEASE_TABLET | Freq: Once | ORAL | Status: AC
  Administered 2011-07-09: 5 mg via ORAL
  Filled 2011-07-08: qty 1

## 2011-07-08 MED ORDER — CHLORHEXIDINE GLUCONATE 4 % EX LIQD
60.0000 mL | Freq: Once | CUTANEOUS | Status: AC
  Administered 2011-07-10: 4 via TOPICAL
  Filled 2011-07-08: qty 60

## 2011-07-08 MED ORDER — CHLORHEXIDINE GLUCONATE 4 % EX LIQD
60.0000 mL | Freq: Once | CUTANEOUS | Status: AC
  Administered 2011-07-09: 4 via TOPICAL
  Filled 2011-07-08: qty 60

## 2011-07-08 MED ORDER — MOVING RIGHT ALONG BOOK
Freq: Once | Status: AC
  Administered 2011-07-08: 03:00:00
  Filled 2011-07-08: qty 1

## 2011-07-08 MED ORDER — METOPROLOL TARTRATE 12.5 MG HALF TABLET
12.5000 mg | ORAL_TABLET | Freq: Once | ORAL | Status: AC
  Administered 2011-07-10: 12.5 mg via ORAL
  Filled 2011-07-08: qty 1

## 2011-07-08 MED ORDER — ~~LOC~~ CARDIAC SURGERY, PATIENT & FAMILY EDUCATION
Freq: Once | Status: AC
  Administered 2011-07-08: 03:00:00
  Filled 2011-07-08: qty 1

## 2011-07-08 MED ORDER — GUAIFENESIN-DM 100-10 MG/5ML PO SYRP
10.0000 mL | ORAL_SOLUTION | ORAL | Status: DC | PRN
  Administered 2011-07-08: 10 mL via ORAL
  Filled 2011-07-08: qty 10

## 2011-07-08 MED ORDER — TEMAZEPAM 15 MG PO CAPS: 15.0000 mg | ORAL_CAPSULE | Freq: Once | ORAL | Status: AC | PRN

## 2011-07-08 NOTE — Consult Note (Signed)
NAMEMarland Kitchen  Dean Cole, Dean Cole NO.:  [id number]  MEDICAL RECORD NO.:  [id number]  LOCATION:  2507                         FACILITY:  [facility]  PHYSICIAN:  Kerin Perna, M.D.  DATE OF BIRTH:  [birth year]  DATE OF CONSULTATION:  07/07/2011 DATE OF DISCHARGE:                                CONSULTATION   PHYSICIAN REQUESTING CONSULTATION:  Verne Carrow, MD  PRIMARY CARE PHYSICIAN:  Erskine Speed, MD  REASON FOR CONSULTATION:  Left main and severe three-vessel coronary artery disease.  CHIEF COMPLAINT:  External chest pain, exertional shortness of breath.  HISTORY OF PRESENT ILLNESS:  I was asked to evaluate this 61 year old Caucasian male for possible multivessel bypass grafting after being recently diagnosed with severe left main and three-vessel coronary artery disease.  The patient has a long history of exertional chest pain, which has worsened significantly in the last few weeks in the cold weather, and is usually when he is exertional especially outside, and the pain is relieved by rest.  There has been no nocturnal or resting symptoms.  He has had dyspnea on exertion as well.  He denies any history of orthopnea, PND, edema, or syncope.  A stress test was performed, which showed ST-segment depression and a cardiac catheterization today showed a 90% left main stenosis with high-grade stenosis of the right coronary as well and moderate disease of the circumflex.  EF was normal and there was no evidence of mitral valve or aortic valve disease.  LVEDP was 12 mmHg.  The patient was felt to be a candidate for surgical coronary revascularization.  PAST MEDICAL HISTORY: 1. Hypertension. 2. Borderline diabetes. 3. Hypothyroidism.  ALLERGIES:  None.  HOME MEDICATIONS:  Synthroid, aspirin, Valium, Tofranil, Micardis, Hytrin.  SOCIAL HISTORY:  The patient is a retired Personnel officer.  He is married. He enjoys working in his garden.  He has not smoked in 20 years.   He drinks alcohol occasionally.  FAMILY HISTORY:  Strongly positive for coronary artery disease, coronary artery stents, and bypass surgery.  Family history also positive for diabetes.  REVIEW OF SYSTEMS:  Constitutional review is negative for fever or weight loss.  HEENT review is negative for change in vision or difficulty swallowing.  He has positive upper and lower dental plates. Thorax is negative for history of trauma or recent symptoms of pneumonia.  Cardiac review is positive for angina and negative for history of murmur or arrhythmia.  GI review is negative for pain, blood per rectum, or jaundice.  Endocrine review is positive for thyroid disease and negative for significant diabetes and he only watches his diet.  Vascular review is negative for DVT, claudication, or TIA. Musculoskeletal review is negative for history of injury to his extremities or thorax.  Neurologic review is negative for stroke or seizure.  PHYSICAL EXAMINATION:  VITAL SIGNS:  The patient is 5 foot and 10 inches and weight is 208 pounds, blood pressure 110/70, pulse 94, sinus. GENERAL APPEARANCE:  That of a pleasant, middle-aged Caucasian male in his room accompanied by his wife, in no acute distress. HEENT:  Normocephalic.  Pupils are equal.  Dentition is good with full upper and lower plates. NECK:  Without  JVD, mass or carotid bruit. THORAX:  Without deformity, tenderness with clear breath sounds. CARDIAC:  Regular rhythm without S3, gallop, or murmur. ABDOMEN:  Soft, obese without pulsatile mass. EXTREMITIES:  Reveal no clubbing, cyanosis, or edema.  Peripheral pulses are intact in all extremities without venous insufficiency of the legs. NEUROLOGIC:  Alert and oriented without focal motor deficit.  LABORATORY DATA:  I reviewed the coronary arteriograms and agree with Dr. Gibson Ramp impression of severe multivessel disease with preserved LV function.  His PFT showed normal FEV1 and FVC, and  carotid Dopplers are pending.  Creatinine is 1.3 and his hematocrit is 35%.  PLAN:  The patient will be prepared for multivessel bypass grafting on Monday, July 10, 2010.  I have discussed the procedure, indications, benefits, and risks, and he understands and agrees to proceed.  Thank you for the consultation.     Kerin Perna, M.D.     PV/MEDQ  D:  07/07/2011  T:  07/08/2011  Job:  [id number]

## 2011-07-08 NOTE — Progress Notes (Signed)
Patient Name: Dean Cole Date of Encounter: 07/08/2011     Principal Problem:  *Unstable angina Active Problems:  HTN (hypertension)  Hypothyroidism  Coronary artery disease  Borderline diabetes  Restless leg syndrome    SUBJECTIVE  No chest pain, sob, r wrist, or r groin pain/complaints.  CURRENT MEDS    . aspirin      . aspirin EC  81 mg Oral Daily  . atorvastatin  80 mg Oral q1800  . diazepam  5 mg Oral QID  . fentaNYL      . going for heart surgery book   Does not apply Once  . heparin      . heparin      . hydrochlorothiazide  12.5 mg Oral Daily  . irbesartan  300 mg Oral Daily  . levothyroxine  150 mcg Oral Q breakfast  . lidocaine      . metoprolol tartrate  25 mg Oral BID  . midazolam      . midazolam      . moving right along book   Does not apply Once  . nitroGLYCERIN      . pentazocine-naloxone  1 tablet Oral BID    OBJECTIVE  Filed Vitals:   07/07/11 1950 07/07/11 2358 07/08/11 0436 07/08/11 0733  BP: 129/71 122/71 118/61 104/53  Pulse: 85 70 61 68  Temp: 98.1 F (36.7 C) 98.6 F (37 C) 97.5 F (36.4 C) 97.7 F (36.5 C)  TempSrc: Oral Oral Oral Oral  Resp: 20 16 18 18   Height:      Weight:   209 lb 7 oz (95 kg)   SpO2: 99% 99% 97% 95%    Intake/Output Summary (Last 24 hours) at 07/08/11 1610 Last data filed at 07/07/11 2100  Gross per 24 hour  Intake    960 ml  Output      0 ml  Net    960 ml   Filed Weights   07/07/11 0846 07/08/11 0436  Weight: 209 lb (94.802 kg) 209 lb 7 oz (95 kg)    PHYSICAL EXAM  General: Pleasant, NAD.  Multiple piercings and tattoos.  Neuro: Alert and oriented X 3. Moves all extremities spontaneously. Psych: Normal affect. HEENT:  Normal  Neck: Supple without bruits or JVD. Lungs:  Resp regular and unlabored, CTA. Heart: RRR no s3, s4, or murmurs. Abdomen: Soft, non-tender, non-distended, BS + x 4.  Extremities: No clubbing, cyanosis or edema. DP/PT/Radials 2+ and equal bilaterally.  Right  groin and Right radial cath sites are w/o bleeding, bruit, or hematoma.  Accessory Clinical Findings  CBC  Basename 07/07/11 0947  WBC 5.6  NEUTROABS --  HGB 12.1*  HCT 35.6*  MCV 90.4  PLT 128*   Basic Metabolic Panel  Basename 07/07/11 0942 07/06/11 1312  NA 141 138  K 4.2 4.5  CL 108 101  CO2 27 29  GLUCOSE 105* 111*  BUN 16 18  CREATININE 1.36* 1.7*  CALCIUM 8.0* 9.1  MG -- --  PHOS -- --    TELE  RSR, occas pvc's.  ASSESSMENT AND PLAN  1.  USA/CAD:  S/p cath yesterday revealing severe multivessel dzs.  Pending CABG on Monday 3/11.  No c/p overnight.  Cont asa, statin, bb, arb.  F/U labs this am.  2.  HTN: stable.  3.  Lipids: f/u fasting lipids if not already ordered.  Cont statin.  4.  Renal insuff:  F/u labs this am.  5.  Restless Leg Syndrome:  Takes imipramine @ home - d/c'd by Dr. Clifton James - caution advised in CAD.  Signed, Nicolasa Ducking NP Patient seen and examined. I agree with the assessment and plan as detailed above. See also my additional thoughts below.   Patient is stable today. He is scheduled for CABG.  Willa Rough, MD, [facility] 07/08/2011 8:30 AM

## 2011-07-08 NOTE — Progress Notes (Signed)
  Echocardiogram 2D Echocardiogram has been performed.  Dean Cole, Real Cons 07/08/2011, 9:19 AM

## 2011-07-08 NOTE — Consult Note (Signed)
NAMEMarland Kitchen  Dean Cole, Dean NO.:  [id number]  MEDICAL RECORD NO.:  [id number]  LOCATION:  2507                         FACILITY:  [facility]  PHYSICIAN:  Kerin Perna, M.D.  DATE OF BIRTH:  10-Jul-1950  DATE OF CONSULTATION:  07/07/2011 DATE OF DISCHARGE:                                CONSULTATION   PHYSICIAN REQUESTING CONSULTATION:  Verne Carrow, MD  PRIMARY CARE PHYSICIAN:  Lenon Curt. Green, MD  REASON FOR CONSULTATION:  Left main and three-vessel coronary artery disease.  CHIEF COMPLAINT:  Chest pain, class III angina.  PRESENT ILLNESS:  I was asked to evaluate this 61 year old Caucasian male, ex-smoker, for possible multivessel bypass grafting after being recently diagnosed with severe left main and three-vessel coronary artery disease.  The patient has had history of exertional chest pain, but this has become much worse this winter in the cold air doing almost an any type of activity.  It happens 2-3 times a day.  It has not happened at night or at rest.  It is relieved by rest and has had associated dyspnea on exertion.  No syncope.  No PND.  No orthopnea.  A stress test was positive with ST-segment depression.  A cardiac catheterization today demonstrated a significant left main stenosis with three-vessel disease, preserved LV function, and LVEDP 12 mmHg.  No evidence of AS or MR.  A 2-D echo was pending.  The patient was felt to be a good candidate for surgical coronary revascularization.  PAST MEDICAL HISTORY: 1. Hypertension. 2. Hypothyroidism. 3. Borderline diabetes.  HOME MEDICATIONS: 1. Aspirin. 2. Valium. 3. Tofranil. 4. Synthroid 150 mcg. 5. Talwin p.r.n. pain. 6. Micardis HCT. 7. Hytrin.  DRUG ALLERGIES:  None.  SOCIAL HISTORY:  The patient is retired.  He enjoys working in his garden.  He was Personnel officer.  He is married.  He stopped smoking 20 years ago.  He drinks alcohol occasionally.  He lives with his wife.  FAMILY  HISTORY:  Strongly positive for coronary artery disease several aunts, uncles, and parents have had stents and/or heart bypass surgery. Diabetes is also runs in the family.  REVIEW OF SYSTEMS:  Constitutional review is negative for fever or change in weight.  HEENT review is negative for difficulty swallowing or change in vision.  Thoracic review is negative for history of thoracic trauma or recent symptoms of upper respiratory infection.  Cardiac review is positive for coronary artery disease and negative for valvular disease or arrhythmia or murmur.  GI review is negative for hepatitis, jaundice, or blood per rectum.  No prior abdominal operations. Endocrine review is positive for hypothyroidism.  Vascular review is negative for DVT, claudication, TIA, or varicose veins.  Neurologic review is negative for stroke or seizure.  Musculoskeletal review is negative for significant fracture or injury.  PHYSICAL EXAMINATION:  VITAL SIGNS:  The patient is 5 foot and 10 inches, weight is 208 pounds, blood pressure 110/70, pulse 94, respirations 18. GENERAL APPEARANCE:  That of a middle-aged Caucasian male, in no acute distress. HEENT:  Normocephalic.  Pupils are equal.  Dentition is full, upper and lower dental plates. NECK:  Without JVD, mass, or carotid bruit. LYMPHATICS:  No palpable  adenopathy in the neck or supraclavicular fossa. THORAX:  Without deformity.  Breath sounds are clear and equal. CARDIAC:  Regular rhythm without murmur, rub, or gallop. ABDOMEN:  Soft, nontender without pulsatile mass. EXTREMITIES:  Reveal no clubbing, cyanosis, or edema.  Peripheral pulses are 2+, strong in all extremities.  No venous insufficiency of his lower extremities. NEUROLOGIC:  Alert and oriented without focal motor deficit.  Dictation ended at this point.     Kerin Perna, M.D.     PV/MEDQ  D:  07/07/2011  T:  07/08/2011  Job:  [id number]

## 2011-07-08 NOTE — Progress Notes (Signed)
REPORT CALLED TO TINA, RN ON UNIT [id number], ON SECOND ATTEMPT.  REPORT GIVEN AND PT WILL TRANSFER VIA WHEELCHAIR WITH WIFE AND BELONGINGS.

## 2011-07-09 DIAGNOSIS — Z01810 Encounter for preprocedural cardiovascular examination: Secondary | ICD-10-CM

## 2011-07-09 LAB — CBC
HCT: 40.9 % (ref 39.0–52.0)
Hemoglobin: 14.5 g/dL (ref 13.0–17.0)
MCHC: 35.5 g/dL (ref 30.0–36.0)
MCV: 89.3 fL (ref 78.0–100.0)
RDW: 13.1 % (ref 11.5–15.5)

## 2011-07-09 LAB — BASIC METABOLIC PANEL
BUN: 23 mg/dL (ref 6–23)
CO2: 31 mEq/L (ref 19–32)
Calcium: 9.5 mg/dL (ref 8.4–10.5)
Chloride: 99 mEq/L (ref 96–112)
Creatinine, Ser: 1.53 mg/dL — ABNORMAL HIGH (ref 0.50–1.35)
Glucose, Bld: 113 mg/dL — ABNORMAL HIGH (ref 70–99)

## 2011-07-09 LAB — LIPID PANEL
HDL: 31 mg/dL — ABNORMAL LOW (ref >39)
LDL Cholesterol: 107 mg/dL — ABNORMAL HIGH (ref 0–99)
VLDL: 62 mg/dL — ABNORMAL HIGH (ref 0–40)

## 2011-07-09 LAB — TSH: TSH: 4.326 u[IU]/mL (ref 0.350–4.500)

## 2011-07-09 NOTE — Progress Notes (Signed)
TCTS BRIEF PROGRESS NOTE   Clinically stable Labs okay For CABG tomorrow  Purcell Nails 07/09/2011 1:35 PM

## 2011-07-09 NOTE — Progress Notes (Addendum)
13Pre-op Cardiac Surgery  Carotid Findings:  No ICA stenosis.   Vertebral artery flow is antegrade.  Upper Extremity Right Left  Brachial Pressures 138 Triphasic 135 Triphasic  Radial Waveforms Triphasic Triphasic  Ulnar Waveforms Biphasic Triphasic  Palmar Arch (Allen's Test) Abnormal Normal   Findings:  Palmer arch evaluation - Right doppler waveforms remained normal with radial compression and obliterated with ulnar compression. Doppler waveforms on the left remained normal with both radial and ulnar compressions.    Lower  Extremity Right Left  Dorsalis Pedis    Anterior Tibial    Posterior Tibial    Ankle/Brachial Indices      Findings:  Palpable pedal pulses bilaterally.

## 2011-07-09 NOTE — Progress Notes (Signed)
Patient ID: Dean Cole, male   DOB: [birth year], 61 y.o.   MRN: [id number] Subjective:  No chest pain or sob  Objective:  Vital Signs in the last 24 hours: Temp:  [97.2 F (36.2 C)-98.5 F (36.9 C)] 97.7 F (36.5 C) (03/10 0459) Pulse Rate:  [65-88] 65  (03/10 0459) Resp:  [18-19] 18  (03/10 0459) BP: (111-133)/(72-76) 114/73 mmHg (03/10 0459) SpO2:  [96 %-100 %] 97 % (03/10 0459) Weight:  [91.3 kg (201 lb 4.5 oz)] 91.3 kg (201 lb 4.5 oz) (03/10 0459)  Intake/Output from previous day: 03/09 0701 - 03/10 0700 In: 960 [P.O.:960] Out: -  Intake/Output from this shift:    Physical Exam: Well appearing NAD HEENT: Unremarkable Neck:  No JVD, no thyromegally Lungs:  Clear with no wheezes. HEART:  Regular rate rhythm, no murmurs, no rubs, no clicks Abd:  soft, positive bowel sounds, no organomegally, no rebound, no guarding Ext:  2 plus pulses, no edema, no cyanosis, no clubbing Skin:  No rashes no nodules Neuro:  CN II through XII intact, motor grossly intact  Lab Results:  Basename 07/08/11 1006 07/07/11 0947  WBC 7.4 5.6  HGB 13.9 12.1*  PLT 143* 128*    Basename 07/08/11 1006 07/07/11 0942  NA 135 141  K 4.1 4.2  CL 97 108  CO2 29 27  GLUCOSE 138* 105*  BUN 21 16  CREATININE 1.45* 1.36*   No results found for this basename: TROPONINI:2,CK,MB:2 in the last 72 hours Hepatic Function Panel No results found for this basename: PROT,ALBUMIN,AST,ALT,ALKPHOS,BILITOT,BILIDIR,IBILI in the last 72 hours No results found for this basename: CHOL in the last 72 hours No results found for this basename: PROTIME in the last 72 hours  Imaging: Dg Chest 2 View  07/08/2011  *RADIOLOGY REPORT*  Clinical Data: Pre CABG evaluation.  Ex-smoker.  CHEST - 2 VIEW  Comparison: None.  Findings: Normal sized heart.  Linear density in both lower lung zones.  Minimal central peribronchial thickening.  Bilateral nipple studs.  Mild scoliosis.  IMPRESSION: Bibasilar linear scarring and  minimal chronic bronchitic changes. No acute abnormality.  Original Report Authenticated By: Darrol Angel, M.D.    Cardiac Studies: Tele - NSR Assessment/Plan:  1. CAD with [place] - for CABG in a.m. 2. HTN - stable  LOS: 2 days    Lewayne Bunting 07/09/2011, 8:52 AM

## 2011-07-10 ENCOUNTER — Inpatient Hospital Stay (HOSPITAL_COMMUNITY): Payer: Medicare Other | Admitting: Anesthesiology

## 2011-07-10 ENCOUNTER — Encounter (HOSPITAL_COMMUNITY): Payer: Self-pay | Admitting: Anesthesiology

## 2011-07-10 ENCOUNTER — Other Ambulatory Visit: Payer: Self-pay

## 2011-07-10 ENCOUNTER — Encounter (HOSPITAL_COMMUNITY)
Admission: RE | Disposition: A | Discharge status: Home / Self Care | Payer: Self-pay | Source: Ambulatory Visit | Attending: Cardiothoracic Surgery

## 2011-07-10 ENCOUNTER — Inpatient Hospital Stay (HOSPITAL_COMMUNITY): Payer: Medicare Other

## 2011-07-10 DIAGNOSIS — I2510 Atherosclerotic heart disease of native coronary artery without angina pectoris: Secondary | ICD-10-CM

## 2011-07-10 HISTORY — PX: CORONARY ARTERY BYPASS GRAFT: SHX141

## 2011-07-10 LAB — CBC
HCT: 26.4 % — ABNORMAL LOW (ref 39.0–52.0)
Hemoglobin: 9.3 g/dL — ABNORMAL LOW (ref 13.0–17.0)
MCH: 30.8 pg (ref 26.0–34.0)
MCH: 30.7 pg (ref 26.0–34.0)
MCHC: 35.2 g/dL (ref 30.0–36.0)
MCV: 87.1 fL (ref 78.0–100.0)
Platelets: 131 10*3/uL — ABNORMAL LOW (ref 150–400)
Platelets: 135 10*3/uL — ABNORMAL LOW (ref 150–400)
RBC: 3.08 MIL/uL — ABNORMAL LOW (ref 4.22–5.81)
RBC: 3.03 MIL/uL — ABNORMAL LOW (ref 4.22–5.81)
RDW: 13.1 % (ref 11.5–15.5)
RDW: 13.1 % (ref 11.5–15.5)
WBC: 12.7 10*3/uL — ABNORMAL HIGH (ref 4.0–10.5)
WBC: 9.5 10*3/uL (ref 4.0–10.5)

## 2011-07-10 LAB — POCT I-STAT 4, (NA,K, GLUC, HGB,HCT)
Glucose, Bld: 121 mg/dL — ABNORMAL HIGH (ref 70–99)
Glucose, Bld: 147 mg/dL — ABNORMAL HIGH (ref 70–99)
Glucose, Bld: 157 mg/dL — ABNORMAL HIGH (ref 70–99)
Glucose, Bld: 136 mg/dL — ABNORMAL HIGH (ref 70–99)
HCT: 23 % — ABNORMAL LOW (ref 39.0–52.0)
HCT: 21 % — ABNORMAL LOW (ref 39.0–52.0)
HCT: 25 % — ABNORMAL LOW (ref 39.0–52.0)
HCT: 26 % — ABNORMAL LOW (ref 39.0–52.0)
Hemoglobin: 12.6 g/dL — ABNORMAL LOW (ref 13.0–17.0)
Hemoglobin: 8.2 g/dL — ABNORMAL LOW (ref 13.0–17.0)
Hemoglobin: 7.1 g/dL — ABNORMAL LOW (ref 13.0–17.0)
Hemoglobin: 8.8 g/dL — ABNORMAL LOW (ref 13.0–17.0)
Potassium: 4.1 mEq/L (ref 3.5–5.1)
Potassium: 4 mEq/L (ref 3.5–5.1)
Potassium: 4.4 mEq/L (ref 3.5–5.1)
Potassium: 3.7 mEq/L (ref 3.5–5.1)
Potassium: 3.7 mEq/L (ref 3.5–5.1)
Sodium: 137 mEq/L (ref 135–145)
Sodium: 140 mEq/L (ref 135–145)
Sodium: 135 mEq/L (ref 135–145)

## 2011-07-10 LAB — POCT I-STAT 3, ART BLOOD GAS (G3+)
Acid-Base Excess: 2 mmol/L (ref 0.0–2.0)
Acid-base deficit: 3 mmol/L — ABNORMAL HIGH (ref 0.0–2.0)
Bicarbonate: 27.2 mEq/L — ABNORMAL HIGH (ref 20.0–24.0)
Bicarbonate: 26.4 mEq/L — ABNORMAL HIGH (ref 20.0–24.0)
O2 Saturation: 100 %
O2 Saturation: 100 %
O2 Saturation: 96 %
O2 Saturation: 98 %
Patient temperature: 36.1
TCO2: 29 mmol/L (ref 0–100)
TCO2: 24 mmol/L (ref 0–100)
TCO2: 25 mmol/L (ref 0–100)
pCO2 arterial: 48.4 mmHg — ABNORMAL HIGH (ref 35.0–45.0)
pCO2 arterial: 29.5 mmHg — ABNORMAL LOW (ref 35.0–45.0)
pCO2 arterial: 35.7 mmHg (ref 35.0–45.0)
pCO2 arterial: 44.3 mmHg (ref 35.0–45.0)
pCO2 arterial: 48.5 mmHg — ABNORMAL HIGH (ref 35.0–45.0)
pH, Arterial: 7.325 — ABNORMAL LOW (ref 7.350–7.450)
pH, Arterial: 7.318 — ABNORMAL LOW (ref 7.350–7.450)
pO2, Arterial: 274 mmHg — ABNORMAL HIGH (ref 80.0–100.0)
pO2, Arterial: 151 mmHg — ABNORMAL HIGH (ref 80.0–100.0)
pO2, Arterial: 74 mmHg — ABNORMAL LOW (ref 80.0–100.0)

## 2011-07-10 LAB — BLOOD GAS, ARTERIAL
Bicarbonate: 25.6 mEq/L — ABNORMAL HIGH (ref 20.0–24.0)
Patient temperature: 98.6
TCO2: 26.9 mmol/L (ref 0–100)
pH, Arterial: 7.407 (ref 7.350–7.450)

## 2011-07-10 LAB — CREATININE, SERUM
Creatinine, Ser: 1.33 mg/dL (ref 0.50–1.35)
GFR calc Af Amer: 66 mL/min — ABNORMAL LOW (ref >90)
GFR calc non Af Amer: 57 mL/min — ABNORMAL LOW (ref >90)

## 2011-07-10 LAB — PLATELET COUNT: Platelets: 137 10*3/uL — ABNORMAL LOW (ref 150–400)

## 2011-07-10 LAB — POCT I-STAT, CHEM 8
BUN: 25 mg/dL — ABNORMAL HIGH (ref 6–23)
Hemoglobin: 8.5 g/dL — ABNORMAL LOW (ref 13.0–17.0)
Sodium: 139 mEq/L (ref 135–145)
TCO2: 25 mmol/L (ref 0–100)

## 2011-07-10 LAB — POCT I-STAT GLUCOSE: Glucose, Bld: 129 mg/dL — ABNORMAL HIGH (ref 70–99)

## 2011-07-10 LAB — GLUCOSE, CAPILLARY
Glucose-Capillary: 124 mg/dL — ABNORMAL HIGH (ref 70–99)
Glucose-Capillary: 90 mg/dL (ref 70–99)
Glucose-Capillary: 100 mg/dL — ABNORMAL HIGH (ref 70–99)

## 2011-07-10 LAB — HEMOGLOBIN AND HEMATOCRIT, BLOOD
HCT: 20.3 % — ABNORMAL LOW (ref 39.0–52.0)
Hemoglobin: 7.2 g/dL — ABNORMAL LOW (ref 13.0–17.0)

## 2011-07-10 LAB — PROTIME-INR: Prothrombin Time: 20.2 seconds — ABNORMAL HIGH (ref 11.6–15.2)

## 2011-07-10 LAB — MAGNESIUM: Magnesium: 3.4 mg/dL — ABNORMAL HIGH (ref 1.5–2.5)

## 2011-07-10 SURGERY — CORONARY ARTERY BYPASS GRAFTING (CABG)
Anesthesia: General | Site: Chest | Wound class: Clean

## 2011-07-10 MED ORDER — VANCOMYCIN HCL 1000 MG IV SOLR
1000.0000 mg | Freq: Once | INTRAVENOUS | Status: AC
  Administered 2011-07-10: 1000 mg via INTRAVENOUS
  Filled 2011-07-10: qty 1000

## 2011-07-10 MED ORDER — LACTATED RINGERS IV SOLN: 500.0000 mL | Freq: Once | INTRAVENOUS | Status: AC | PRN

## 2011-07-10 MED ORDER — DOPAMINE-DEXTROSE 3.2-5 MG/ML-% IV SOLN
INTRAVENOUS | Status: DC | PRN
  Administered 2011-07-10: 3 ug/kg/min via INTRAVENOUS

## 2011-07-10 MED ORDER — DEXTROSE 5 % IV SOLN
1.5000 g | INTRAVENOUS | Status: DC
  Administered 2011-07-10: 1.5 g via INTRAVENOUS
  Administered 2011-07-10: .75 g via INTRAVENOUS
  Filled 2011-07-10: qty 1.5

## 2011-07-10 MED ORDER — PHENYLEPHRINE HCL 10 MG/ML IJ SOLN
30.0000 ug/min | INTRAVENOUS | Status: DC
  Filled 2011-07-10: qty 2

## 2011-07-10 MED ORDER — SODIUM CHLORIDE 0.9 % IV SOLN
1.0000 g/h | INTRAVENOUS | Status: DC
  Filled 2011-07-10: qty 20

## 2011-07-10 MED ORDER — DEXTROSE 5 % IV SOLN: 30.0000 ug/min | INTRAVENOUS | Status: DC

## 2011-07-10 MED ORDER — MAGNESIUM SULFATE 50 % IJ SOLN
40.0000 meq | INTRAMUSCULAR | Status: DC
  Filled 2011-07-10: qty 10

## 2011-07-10 MED ORDER — PHENYLEPHRINE HCL 10 MG/ML IJ SOLN
10.0000 mg | INTRAVENOUS | Status: DC | PRN
  Administered 2011-07-10: 10 ug/min via INTRAVENOUS

## 2011-07-10 MED ORDER — ACETAMINOPHEN 500 MG PO TABS
1000.0000 mg | ORAL_TABLET | Freq: Four times a day (QID) | ORAL | Status: DC
  Administered 2011-07-10 – 2011-07-15 (×18): 1000 mg via ORAL
  Filled 2011-07-10 (×23): qty 2

## 2011-07-10 MED ORDER — ASPIRIN EC 325 MG PO TBEC
325.0000 mg | DELAYED_RELEASE_TABLET | Freq: Every day | ORAL | Status: DC
  Administered 2011-07-11 – 2011-07-15 (×5): 325 mg via ORAL
  Filled 2011-07-10 (×5): qty 1

## 2011-07-10 MED ORDER — NITROGLYCERIN IN D5W 200-5 MCG/ML-% IV SOLN
2.0000 ug/min | INTRAVENOUS | Status: DC
  Administered 2011-07-10: 5 ug/min via INTRAVENOUS
  Filled 2011-07-10: qty 250

## 2011-07-10 MED ORDER — SODIUM CHLORIDE 0.9 % IV SOLN
0.1000 ug/kg/h | INTRAVENOUS | Status: DC
  Administered 2011-07-10: .2 ug/kg/h via INTRAVENOUS
  Filled 2011-07-10: qty 4

## 2011-07-10 MED ORDER — POTASSIUM CHLORIDE 2 MEQ/ML IV SOLN: 80.0000 meq | INTRAVENOUS | Status: DC

## 2011-07-10 MED ORDER — VECURONIUM BROMIDE 10 MG IV SOLR
INTRAVENOUS | Status: DC | PRN
  Administered 2011-07-10: 10 mg via INTRAVENOUS
  Administered 2011-07-10: 6 mg via INTRAVENOUS
  Administered 2011-07-10: 7 mg via INTRAVENOUS
  Administered 2011-07-10: 3 mg via INTRAVENOUS
  Administered 2011-07-10: 4 mg via INTRAVENOUS

## 2011-07-10 MED ORDER — LACTATED RINGERS IV SOLN
INTRAVENOUS | Status: DC | PRN
  Administered 2011-07-10 (×2): via INTRAVENOUS

## 2011-07-10 MED ORDER — HEPARIN SODIUM (PORCINE) 1000 UNIT/ML IJ SOLN
INTRAMUSCULAR | Status: DC | PRN
  Administered 2011-07-10: 7000 [IU] via INTRAVENOUS
  Administered 2011-07-10: 5000 [IU] via INTRAVENOUS
  Administered 2011-07-10: 2000 [IU] via INTRAVENOUS

## 2011-07-10 MED ORDER — BISACODYL 10 MG RE SUPP: 10.0000 mg | Freq: Every day | RECTAL | Status: DC

## 2011-07-10 MED ORDER — HEMOSTATIC AGENTS (NO CHARGE) OPTIME
TOPICAL | Status: DC | PRN
  Administered 2011-07-10: 3 via TOPICAL

## 2011-07-10 MED ORDER — SODIUM CHLORIDE 0.45 % IV SOLN
INTRAVENOUS | Status: DC
  Administered 2011-07-10: 20 mL/h via INTRAVENOUS

## 2011-07-10 MED ORDER — EPINEPHRINE HCL 1 MG/ML IJ SOLN
0.5000 ug/min | INTRAVENOUS | Status: DC
  Filled 2011-07-10: qty 4

## 2011-07-10 MED ORDER — PANTOPRAZOLE SODIUM 40 MG PO TBEC
40.0000 mg | DELAYED_RELEASE_TABLET | Freq: Every day | ORAL | Status: DC
  Administered 2011-07-12 – 2011-07-15 (×4): 40 mg via ORAL
  Filled 2011-07-10 (×3): qty 1

## 2011-07-10 MED ORDER — ONDANSETRON HCL 4 MG/2ML IJ SOLN: 4.0000 mg | Freq: Four times a day (QID) | INTRAMUSCULAR | Status: DC | PRN

## 2011-07-10 MED ORDER — INSULIN REGULAR BOLUS VIA INFUSION
0.0000 [IU] | Freq: Three times a day (TID) | INTRAVENOUS | Status: DC
  Filled 2011-07-10: qty 10

## 2011-07-10 MED ORDER — METOPROLOL TARTRATE 1 MG/ML IV SOLN: 2.5000 mg | INTRAVENOUS | Status: DC | PRN

## 2011-07-10 MED ORDER — DEXTROSE 5 % IV SOLN: 1.5000 g | INTRAVENOUS | Status: DC

## 2011-07-10 MED ORDER — SODIUM CHLORIDE 0.9 % IV SOLN
INTRAVENOUS | Status: DC
  Administered 2011-07-10: 14 mL/h via INTRAVENOUS
  Administered 2011-07-10: 69.8 mL/h via INTRAVENOUS
  Filled 2011-07-10: qty 40

## 2011-07-10 MED ORDER — POTASSIUM CHLORIDE 10 MEQ/50ML IV SOLN
10.0000 meq | INTRAVENOUS | Status: AC
  Administered 2011-07-10 (×3): 10 meq via INTRAVENOUS

## 2011-07-10 MED ORDER — DOPAMINE-DEXTROSE 3.2-5 MG/ML-% IV SOLN: 0.0000 ug/kg/min | INTRAVENOUS | Status: DC

## 2011-07-10 MED ORDER — ROCURONIUM BROMIDE 100 MG/10ML IV SOLN
INTRAVENOUS | Status: DC | PRN
  Administered 2011-07-10: 50 mg via INTRAVENOUS

## 2011-07-10 MED ORDER — SODIUM CHLORIDE 0.9 % IV SOLN
0.1000 ug/kg/h | INTRAVENOUS | Status: DC
  Filled 2011-07-10 (×2): qty 2

## 2011-07-10 MED ORDER — MIDAZOLAM HCL 2 MG/2ML IJ SOLN
INTRAMUSCULAR | Status: AC
  Filled 2011-07-10: qty 2

## 2011-07-10 MED ORDER — INSULIN ASPART 100 UNIT/ML ~~LOC~~ SOLN
0.0000 [IU] | SUBCUTANEOUS | Status: DC
  Administered 2011-07-11 (×3): 2 [IU] via SUBCUTANEOUS
  Administered 2011-07-11: 4 [IU] via SUBCUTANEOUS

## 2011-07-10 MED ORDER — LACTATED RINGERS IV SOLN: INTRAVENOUS | Status: DC

## 2011-07-10 MED ORDER — PROTAMINE SULFATE 10 MG/ML IV SOLN
INTRAVENOUS | Status: DC | PRN
  Administered 2011-07-10: 50 mg via INTRAVENOUS
  Administered 2011-07-10: 330 mg via INTRAVENOUS

## 2011-07-10 MED ORDER — NITROGLYCERIN IN D5W 200-5 MCG/ML-% IV SOLN: 2.0000 ug/min | INTRAVENOUS | Status: DC

## 2011-07-10 MED ORDER — VERAPAMIL HCL 2.5 MG/ML IV SOLN: INTRAVENOUS | Status: DC

## 2011-07-10 MED ORDER — LACTATED RINGERS IV SOLN
INTRAVENOUS | Status: DC | PRN
  Administered 2011-07-10: 06:00:00 via INTRAVENOUS

## 2011-07-10 MED ORDER — HEMOSTATIC AGENTS (NO CHARGE) OPTIME
TOPICAL | Status: DC | PRN
  Administered 2011-07-10: 1 via TOPICAL

## 2011-07-10 MED ORDER — VANCOMYCIN HCL 1000 MG IV SOLR
1500.0000 mg | INTRAVENOUS | Status: DC
  Administered 2011-07-10: 1500 mg via INTRAVENOUS
  Filled 2011-07-10: qty 1500

## 2011-07-10 MED ORDER — FENTANYL CITRATE 0.05 MG/ML IJ SOLN
INTRAMUSCULAR | Status: DC | PRN
  Administered 2011-07-10 (×2): 50 ug via INTRAVENOUS
  Administered 2011-07-10: 150 ug via INTRAVENOUS
  Administered 2011-07-10: 250 ug via INTRAVENOUS
  Administered 2011-07-10: 100 ug via INTRAVENOUS
  Administered 2011-07-10: 350 ug via INTRAVENOUS
  Administered 2011-07-10: 250 ug via INTRAVENOUS
  Administered 2011-07-10: 50 ug via INTRAVENOUS
  Administered 2011-07-10: 150 ug via INTRAVENOUS
  Administered 2011-07-10: 100 ug via INTRAVENOUS

## 2011-07-10 MED ORDER — BISACODYL 5 MG PO TBEC
10.0000 mg | DELAYED_RELEASE_TABLET | Freq: Every day | ORAL | Status: DC
  Administered 2011-07-11 – 2011-07-15 (×4): 10 mg via ORAL
  Filled 2011-07-10 (×4): qty 2

## 2011-07-10 MED ORDER — MAGNESIUM SULFATE 40 MG/ML IJ SOLN
4.0000 g | Freq: Once | INTRAMUSCULAR | Status: AC
  Administered 2011-07-10: 4 g via INTRAVENOUS
  Filled 2011-07-10: qty 100

## 2011-07-10 MED ORDER — DEXTROSE 5 % IV SOLN
1.5000 g | Freq: Two times a day (BID) | INTRAVENOUS | Status: DC
  Administered 2011-07-10 – 2011-07-11 (×3): 1.5 g via INTRAVENOUS
  Filled 2011-07-10 (×4): qty 1.5

## 2011-07-10 MED ORDER — PHENYLEPHRINE HCL 10 MG/ML IJ SOLN
0.0000 ug/min | INTRAVENOUS | Status: DC
  Filled 2011-07-10: qty 2

## 2011-07-10 MED ORDER — DOPAMINE-DEXTROSE 3.2-5 MG/ML-% IV SOLN
2.0000 ug/kg/min | INTRAVENOUS | Status: DC
  Filled 2011-07-10: qty 250

## 2011-07-10 MED ORDER — MAGNESIUM SULFATE 50 % IJ SOLN: 40.0000 meq | INTRAMUSCULAR | Status: DC

## 2011-07-10 MED ORDER — MIDAZOLAM HCL 5 MG/5ML IJ SOLN
INTRAMUSCULAR | Status: DC | PRN
  Administered 2011-07-10 (×2): 2 mg via INTRAVENOUS
  Administered 2011-07-10: 1 mg via INTRAVENOUS
  Administered 2011-07-10: 4 mg via INTRAVENOUS
  Administered 2011-07-10 (×3): 2 mg via INTRAVENOUS
  Administered 2011-07-10: 1 mg via INTRAVENOUS
  Administered 2011-07-10 (×2): 2 mg via INTRAVENOUS

## 2011-07-10 MED ORDER — ASPIRIN 81 MG PO CHEW: 324.0000 mg | CHEWABLE_TABLET | Freq: Every day | ORAL | Status: DC

## 2011-07-10 MED ORDER — ACETAMINOPHEN 160 MG/5ML PO SOLN
975.0000 mg | Freq: Four times a day (QID) | ORAL | Status: DC
  Filled 2011-07-10: qty 40.6

## 2011-07-10 MED ORDER — NITROGLYCERIN IN D5W 200-5 MCG/ML-% IV SOLN: 0.0000 ug/min | INTRAVENOUS | Status: DC

## 2011-07-10 MED ORDER — SODIUM CHLORIDE 0.9 % IJ SOLN
3.0000 mL | Freq: Two times a day (BID) | INTRAMUSCULAR | Status: DC
  Administered 2011-07-11 – 2011-07-12 (×3): 3 mL via INTRAVENOUS

## 2011-07-10 MED ORDER — DEXTROSE 5 % IV SOLN
750.0000 mg | INTRAVENOUS | Status: DC
  Filled 2011-07-10: qty 750

## 2011-07-10 MED ORDER — DOCUSATE SODIUM 100 MG PO CAPS
200.0000 mg | ORAL_CAPSULE | Freq: Every day | ORAL | Status: DC
  Administered 2011-07-11 – 2011-07-15 (×5): 200 mg via ORAL
  Filled 2011-07-10 (×5): qty 2

## 2011-07-10 MED ORDER — ACETAMINOPHEN 160 MG/5ML PO SOLN: 650.0000 mg | ORAL | Status: AC

## 2011-07-10 MED ORDER — METOPROLOL TARTRATE 25 MG/10 ML ORAL SUSPENSION
12.5000 mg | Freq: Two times a day (BID) | ORAL | Status: DC
  Filled 2011-07-10 (×5): qty 5

## 2011-07-10 MED ORDER — PROPOFOL 10 MG/ML IV EMUL
INTRAVENOUS | Status: DC | PRN
  Administered 2011-07-10: 200 mg via INTRAVENOUS

## 2011-07-10 MED ORDER — MORPHINE SULFATE 2 MG/ML IJ SOLN
1.0000 mg | INTRAMUSCULAR | Status: AC | PRN
  Administered 2011-07-10: 4 mg via INTRAVENOUS
  Administered 2011-07-10: 2 mg via INTRAVENOUS
  Filled 2011-07-10: qty 1
  Filled 2011-07-10: qty 2

## 2011-07-10 MED ORDER — DEXTROSE 5 % IV SOLN: 750.0000 mg | INTRAVENOUS | Status: DC

## 2011-07-10 MED ORDER — SODIUM CHLORIDE 0.9 % IV SOLN
250.0000 mL | INTRAVENOUS | Status: DC
  Administered 2011-07-11: 250 mL via INTRAVENOUS

## 2011-07-10 MED ORDER — 0.9 % SODIUM CHLORIDE (POUR BTL) OPTIME
TOPICAL | Status: DC | PRN
  Administered 2011-07-10: 6000 mL

## 2011-07-10 MED ORDER — SODIUM CHLORIDE 0.9 % IV SOLN
INTRAVENOUS | Status: DC
  Filled 2011-07-10: qty 1

## 2011-07-10 MED ORDER — SODIUM CHLORIDE 0.9 % IV SOLN: 0.1000 ug/kg/h | INTRAVENOUS | Status: DC

## 2011-07-10 MED ORDER — ACETAMINOPHEN 650 MG RE SUPP
650.0000 mg | RECTAL | Status: AC
  Administered 2011-07-10: 650 mg via RECTAL

## 2011-07-10 MED ORDER — DOPAMINE-DEXTROSE 3.2-5 MG/ML-% IV SOLN: 2.0000 ug/kg/min | INTRAVENOUS | Status: DC

## 2011-07-10 MED ORDER — SODIUM CHLORIDE 0.9 % IV SOLN
INTRAVENOUS | Status: DC
  Administered 2011-07-10: 1 [IU]/h via INTRAVENOUS
  Filled 2011-07-10: qty 1

## 2011-07-10 MED ORDER — ALBUMIN HUMAN 5 % IV SOLN
250.0000 mL | INTRAVENOUS | Status: AC | PRN
  Administered 2011-07-10 (×2): 250 mL via INTRAVENOUS

## 2011-07-10 MED ORDER — SODIUM CHLORIDE 0.9 % IV SOLN: INTRAVENOUS | Status: DC

## 2011-07-10 MED ORDER — MIDAZOLAM HCL 2 MG/2ML IJ SOLN
2.0000 mg | INTRAMUSCULAR | Status: DC | PRN
  Administered 2011-07-10: 2 mg via INTRAVENOUS

## 2011-07-10 MED ORDER — SODIUM CHLORIDE 0.9 % IJ SOLN: 3.0000 mL | INTRAMUSCULAR | Status: DC | PRN

## 2011-07-10 MED ORDER — VERAPAMIL HCL 2.5 MG/ML IV SOLN
INTRAVENOUS | Status: DC | PRN
  Administered 2011-07-10: 09:00:00

## 2011-07-10 MED ORDER — ACETAMINOPHEN 500 MG PO TABS: 1000.0000 mg | ORAL_TABLET | Freq: Four times a day (QID) | ORAL | Status: DC

## 2011-07-10 MED ORDER — FAMOTIDINE IN NACL 20-0.9 MG/50ML-% IV SOLN
20.0000 mg | Freq: Two times a day (BID) | INTRAVENOUS | Status: DC
  Administered 2011-07-10: 20 mg via INTRAVENOUS

## 2011-07-10 MED ORDER — ALBUMIN HUMAN 5 % IV SOLN
INTRAVENOUS | Status: DC | PRN
  Administered 2011-07-10 (×3): via INTRAVENOUS

## 2011-07-10 MED ORDER — VERAPAMIL HCL 2.5 MG/ML IV SOLN
INTRAVENOUS | Status: DC
  Filled 2011-07-10 (×2): qty 2.5

## 2011-07-10 MED ORDER — EPINEPHRINE HCL 1 MG/ML IJ SOLN: 0.5000 ug/min | INTRAVENOUS | Status: DC

## 2011-07-10 MED ORDER — POTASSIUM CHLORIDE 2 MEQ/ML IV SOLN
80.0000 meq | INTRAVENOUS | Status: DC
  Filled 2011-07-10: qty 40

## 2011-07-10 MED ORDER — ACETAMINOPHEN 160 MG/5ML PO SOLN: 975.0000 mg | Freq: Four times a day (QID) | ORAL | Status: DC

## 2011-07-10 MED ORDER — VANCOMYCIN HCL 1000 MG IV SOLR: 1500.0000 mg | INTRAVENOUS | Status: DC

## 2011-07-10 MED ORDER — MORPHINE SULFATE 4 MG/ML IJ SOLN
2.0000 mg | INTRAMUSCULAR | Status: DC | PRN
  Administered 2011-07-10 – 2011-07-11 (×11): 4 mg via INTRAVENOUS
  Filled 2011-07-10 (×11): qty 1

## 2011-07-10 MED ORDER — OXYCODONE HCL 5 MG PO TABS
5.0000 mg | ORAL_TABLET | ORAL | Status: DC | PRN
  Administered 2011-07-10 – 2011-07-12 (×10): 10 mg via ORAL
  Filled 2011-07-10 (×10): qty 2

## 2011-07-10 MED ORDER — METOPROLOL TARTRATE 12.5 MG HALF TABLET
12.5000 mg | ORAL_TABLET | Freq: Two times a day (BID) | ORAL | Status: DC
  Administered 2011-07-10 – 2011-07-14 (×8): 12.5 mg via ORAL
  Filled 2011-07-10 (×9): qty 1

## 2011-07-10 SURGICAL SUPPLY — 135 items
ADAPTER CARDIO PERF ANTE/RETRO (ADAPTER) ×3 IMPLANT
ADH SKN CLS APL DERMABOND .7 (GAUZE/BANDAGES/DRESSINGS) ×3
ADPR PRFSN 84XANTGRD RTRGD (ADAPTER) ×1
APPLIER CLIP 9.375 MED OPEN (MISCELLANEOUS)
APPLIER CLIP 9.375 SM OPEN (CLIP)
APR CLP MED 9.3 20 MLT OPN (MISCELLANEOUS)
APR CLP SM 9.3 20 MLT OPN (CLIP)
ATTRACTOMAT 16X20 MAGNETIC DRP (DRAPES) ×3 IMPLANT
BAG DECANTER FOR FLEXI CONT (MISCELLANEOUS) ×3 IMPLANT
BANDAGE ACE 4 STERILE (GAUZE/BANDAGES/DRESSINGS) ×2 IMPLANT
BANDAGE ELASTIC 4 VELCRO ST LF (GAUZE/BANDAGES/DRESSINGS) ×3 IMPLANT
BANDAGE ELASTIC 6 VELCRO ST LF (GAUZE/BANDAGES/DRESSINGS) ×3 IMPLANT
BANDAGE GAUZE ELAST BULKY 4 IN (GAUZE/BANDAGES/DRESSINGS) ×3 IMPLANT
BASKET HEART  (ORDER IN 25'S) (MISCELLANEOUS) ×1
BASKET HEART (ORDER IN 25'S) (MISCELLANEOUS) ×1
BASKET HEART (ORDER IN 25S) (MISCELLANEOUS) ×1 IMPLANT
BLADE SAW STERNAL (BLADE) ×3 IMPLANT
BLADE SURG 11 STRL SS (BLADE) ×2 IMPLANT
BLADE SURG 12 STRL SS (BLADE) ×3 IMPLANT
BLADE SURG ROTATE 9660 (MISCELLANEOUS) IMPLANT
CANISTER SUCTION 2500CC (MISCELLANEOUS) ×3 IMPLANT
CANNULA AORTIC HI-FLOW 6.5M20F (CANNULA) ×2 IMPLANT
CANNULA GUNDRY RCSP 15FR (MISCELLANEOUS) ×3 IMPLANT
CATH CPB KIT VANTRIGT (MISCELLANEOUS) ×3 IMPLANT
CATH ROBINSON RED A/P 18FR (CATHETERS) ×9 IMPLANT
CATH THORACIC 28FR (CATHETERS) IMPLANT
CATH THORACIC 28FR RT ANG (CATHETERS) IMPLANT
CATH THORACIC 36FR (CATHETERS) IMPLANT
CATH THORACIC 36FR RT ANG (CATHETERS) ×6 IMPLANT
CLIP APPLIE 9.375 MED OPEN (MISCELLANEOUS) IMPLANT
CLIP APPLIE 9.375 SM OPEN (CLIP) IMPLANT
CLIP FOGARTY SPRING 6M (CLIP) ×2 IMPLANT
CLIP TI MEDIUM 24 (CLIP) IMPLANT
CLIP TI WIDE RED SMALL 24 (CLIP) ×2 IMPLANT
CLOTH BEACON ORANGE TIMEOUT ST (SAFETY) ×3 IMPLANT
CONN Y 3/8X3/8X3/8  BEN (MISCELLANEOUS)
CONN Y 3/8X3/8X3/8 BEN (MISCELLANEOUS) IMPLANT
COVER SURGICAL LIGHT HANDLE (MISCELLANEOUS) ×6 IMPLANT
CRADLE DONUT ADULT HEAD (MISCELLANEOUS) ×3 IMPLANT
DERMABOND ADVANCED (GAUZE/BANDAGES/DRESSINGS) ×6
DERMABOND ADVANCED .7 DNX12 (GAUZE/BANDAGES/DRESSINGS) IMPLANT
DRAIN CHANNEL 32F RND 10.7 FF (WOUND CARE) ×3 IMPLANT
DRAPE CARDIOVASCULAR INCISE (DRAPES) ×3
DRAPE SLUSH MACHINE 52X66 (DRAPES) IMPLANT
DRAPE SLUSH/WARMER DISC (DRAPES) IMPLANT
DRAPE SRG 135X102X78XABS (DRAPES) ×1 IMPLANT
DRSG COVADERM 4X14 (GAUZE/BANDAGES/DRESSINGS) ×3 IMPLANT
ELECT BLADE 4.0 EZ CLEAN MEGAD (MISCELLANEOUS) ×3
ELECT BLADE 6.5 EXT (BLADE) ×3 IMPLANT
ELECT CAUTERY BLADE 6.4 (BLADE) ×3 IMPLANT
ELECT REM PT RETURN 9FT ADLT (ELECTROSURGICAL) ×6
ELECTRODE BLDE 4.0 EZ CLN MEGD (MISCELLANEOUS) ×1 IMPLANT
ELECTRODE REM PT RTRN 9FT ADLT (ELECTROSURGICAL) ×2 IMPLANT
GLOVE BIO SURGEON STRL SZ 6 (GLOVE) IMPLANT
GLOVE BIO SURGEON STRL SZ 6.5 (GLOVE) ×8 IMPLANT
GLOVE BIO SURGEON STRL SZ7 (GLOVE) IMPLANT
GLOVE BIO SURGEON STRL SZ7.5 (GLOVE) ×10 IMPLANT
GLOVE BIO SURGEONS STRL SZ 6.5 (GLOVE) ×8
GLOVE BIOGEL PI IND STRL 6 (GLOVE) IMPLANT
GLOVE BIOGEL PI IND STRL 6.5 (GLOVE) IMPLANT
GLOVE BIOGEL PI IND STRL 7.0 (GLOVE) IMPLANT
GLOVE BIOGEL PI INDICATOR 6 (GLOVE)
GLOVE BIOGEL PI INDICATOR 6.5 (GLOVE)
GLOVE BIOGEL PI INDICATOR 7.0 (GLOVE) ×12
GLOVE EUDERMIC 7 POWDERFREE (GLOVE) IMPLANT
GLOVE ORTHO TXT STRL SZ7.5 (GLOVE) IMPLANT
GOWN PREVENTION PLUS XLARGE (GOWN DISPOSABLE) ×6 IMPLANT
GOWN STRL NON-REIN LRG LVL3 (GOWN DISPOSABLE) ×14 IMPLANT
HEMOSTAT POWDER SURGIFOAM 1G (HEMOSTASIS) ×9 IMPLANT
HEMOSTAT SURGICEL 2X14 (HEMOSTASIS) ×3 IMPLANT
INSERT FOGARTY 61MM (MISCELLANEOUS) IMPLANT
INSERT FOGARTY XLG (MISCELLANEOUS) IMPLANT
KIT BASIN OR (CUSTOM PROCEDURE TRAY) ×3 IMPLANT
KIT ROOM TURNOVER OR (KITS) ×3 IMPLANT
KIT SUCTION CATH 14FR (SUCTIONS) ×3 IMPLANT
KIT VASOVIEW W/TROCAR VH 2000 (KITS) ×3 IMPLANT
LEAD PACING MYOCARDI (MISCELLANEOUS) ×3 IMPLANT
MARKER GRAFT CORONARY BYPASS (MISCELLANEOUS) ×9 IMPLANT
MARKER SKIN DUAL TIP RULER LAB (MISCELLANEOUS) ×4 IMPLANT
NS IRRIG 1000ML POUR BTL (IV SOLUTION) ×17 IMPLANT
PACK OPEN HEART (CUSTOM PROCEDURE TRAY) ×3 IMPLANT
PAD ARMBOARD 7.5X6 YLW CONV (MISCELLANEOUS) ×6 IMPLANT
PENCIL BUTTON HOLSTER BLD 10FT (ELECTRODE) ×3 IMPLANT
PUNCH AORTIC ROTATE 4.0MM (MISCELLANEOUS) IMPLANT
PUNCH AORTIC ROTATE 4.5MM 8IN (MISCELLANEOUS) ×2 IMPLANT
PUNCH AORTIC ROTATE 5MM 8IN (MISCELLANEOUS) IMPLANT
SET CARDIOPLEGIA MPS 5001102 (MISCELLANEOUS) ×2 IMPLANT
SOLUTION ANTI FOG 6CC (MISCELLANEOUS) IMPLANT
SPONGE GAUZE 4X4 12PLY (GAUZE/BANDAGES/DRESSINGS) ×6 IMPLANT
SPONGE INTESTINAL PEANUT (DISPOSABLE) IMPLANT
SPONGE LAP 18X18 X RAY DECT (DISPOSABLE) IMPLANT
SPONGE LAP 4X18 X RAY DECT (DISPOSABLE) ×2 IMPLANT
SUT BONE WAX W31G (SUTURE) ×3 IMPLANT
SUT MNCRL AB 4-0 PS2 18 (SUTURE) ×2 IMPLANT
SUT PROLENE 3 0 SH DA (SUTURE) ×4 IMPLANT
SUT PROLENE 3 0 SH1 36 (SUTURE) ×2 IMPLANT
SUT PROLENE 4 0 RB 1 (SUTURE) ×6
SUT PROLENE 4 0 SH DA (SUTURE) ×3 IMPLANT
SUT PROLENE 4-0 RB1 .5 CRCL 36 (SUTURE) ×1 IMPLANT
SUT PROLENE 5 0 C 1 36 (SUTURE) IMPLANT
SUT PROLENE 6 0 C 1 30 (SUTURE) ×4 IMPLANT
SUT PROLENE 6 0 CC (SUTURE) IMPLANT
SUT PROLENE 7 0 BV 1 (SUTURE) IMPLANT
SUT PROLENE 7 0 BV1 MDA (SUTURE) IMPLANT
SUT PROLENE 7 0 DA (SUTURE) IMPLANT
SUT PROLENE 7.0 RB 3 (SUTURE) ×15 IMPLANT
SUT PROLENE 8 0 BV175 6 (SUTURE) ×4 IMPLANT
SUT PROLENE BLUE 7 0 (SUTURE) ×9 IMPLANT
SUT PROLENE POLY MONO (SUTURE) IMPLANT
SUT SILK  1 MH (SUTURE)
SUT SILK 1 MH (SUTURE) IMPLANT
SUT SILK 2 0 SH CR/8 (SUTURE) ×2 IMPLANT
SUT SILK 3 0 SH CR/8 (SUTURE) ×2 IMPLANT
SUT STEEL 6MS V (SUTURE) ×6 IMPLANT
SUT STEEL STERNAL CCS#1 18IN (SUTURE) IMPLANT
SUT STEEL SZ 6 DBL 3X14 BALL (SUTURE) ×3 IMPLANT
SUT VIC AB 1 CTX 18 (SUTURE) IMPLANT
SUT VIC AB 1 CTX 36 (SUTURE) ×6
SUT VIC AB 1 CTX36XBRD ANBCTR (SUTURE) ×2 IMPLANT
SUT VIC AB 2-0 CT1 27 (SUTURE) ×3
SUT VIC AB 2-0 CT1 TAPERPNT 27 (SUTURE) IMPLANT
SUT VIC AB 2-0 CTX 27 (SUTURE) IMPLANT
SUT VIC AB 3-0 SH 27 (SUTURE)
SUT VIC AB 3-0 SH 27X BRD (SUTURE) IMPLANT
SUT VIC AB 3-0 X1 27 (SUTURE) IMPLANT
SUT VICRYL 4-0 PS2 18IN ABS (SUTURE) IMPLANT
SUTURE E-PAK OPEN HEART (SUTURE) ×3 IMPLANT
SYSTEM SAHARA CHEST DRAIN ATS (WOUND CARE) ×3 IMPLANT
TAPE CLOTH SURG 4X10 WHT LF (GAUZE/BANDAGES/DRESSINGS) ×2 IMPLANT
TOWEL OR 17X24 6PK STRL BLUE (TOWEL DISPOSABLE) ×6 IMPLANT
TOWEL OR 17X26 10 PK STRL BLUE (TOWEL DISPOSABLE) ×6 IMPLANT
TRAY FOLEY IC TEMP SENS 16FR (CATHETERS) ×3 IMPLANT
TUBING INSUFFLATION 10FT LAP (TUBING) ×3 IMPLANT
UNDERPAD 30X30 INCONTINENT (UNDERPADS AND DIAPERS) ×3 IMPLANT
WATER STERILE IRR 1000ML POUR (IV SOLUTION) ×6 IMPLANT

## 2011-07-10 NOTE — OR Nursing (Signed)
Made call to volunteer desk to inform family patient is off pump at 1258. Made first call to 2300 SICU at 1258. Made second call to 2300 SICU at 1328.

## 2011-07-10 NOTE — Anesthesia Preprocedure Evaluation (Signed)
Anesthesia Evaluation  Patient identified by MRN, date of birth, ID band Patient awake    Reviewed: Allergy & Precautions, H&P , NPO status , Patient's Chart, lab work & pertinent test results  Airway Mallampati: II  Neck ROM: full    Dental   Pulmonary shortness of breath, former smoker         Cardiovascular hypertension, + angina + CAD     Neuro/Psych  Headaches,    GI/Hepatic   Endo/Other  Hypothyroidism   Renal/GU      Musculoskeletal   Abdominal   Peds  Hematology   Anesthesia Other Findings   Reproductive/Obstetrics                           Anesthesia Physical Anesthesia Plan  ASA: III  Anesthesia Plan: General   Post-op Pain Management:    Induction: Intravenous  Airway Management Planned: Oral ETT  Additional Equipment: Arterial line, PA Cath and CVP  Intra-op Plan:   Post-operative Plan: Post-operative intubation/ventilation  Informed Consent: I have reviewed the patients History and Physical, chart, labs and discussed the procedure including the risks, benefits and alternatives for the proposed anesthesia with the patient or authorized representative who has indicated his/her understanding and acceptance.     Plan Discussed with: CRNA and Surgeon  Anesthesia Plan Comments:         Anesthesia Quick Evaluation

## 2011-07-10 NOTE — Progress Notes (Signed)
Patient ID: Dean Cole, male   DOB: 1950/09/22, 61 y.o.   MRN: [id number] S/p CABG x 4 Extubated, alert BP 100/56  Pulse 90  Temp(Src) 99.1 F (37.3 C) (Oral)  Resp 23  Ht 5\' 10"  (1.778 m)  Wt 198 lb 3.1 oz (89.9 kg)  BMI 28.44 kg/m2  SpO2 100%  Intake/Output Summary (Last 24 hours) at 07/10/11 2014 Last data filed at 07/10/11 1900  Gross per 24 hour  Intake 5220.7 ml  Output   3095 ml  Net 2125.7 ml   Hct 25  Doing well early postop

## 2011-07-10 NOTE — Progress Notes (Signed)
The patient was examined and preop studies reviewed. There has been no change from the prior exam and the patient is ready for surgery.  aPlan mutivessel CABG

## 2011-07-10 NOTE — Anesthesia Postprocedure Evaluation (Signed)
  Anesthesia Post-op Note  Patient: Dean Cole  Procedure(s) Performed: Procedure(s) (LRB): CORONARY ARTERY BYPASS GRAFTING (CABG) (N/A)  Patient Location: ICU  Anesthesia Type: General  Level of Consciousness: sedated  Airway and Oxygen Therapy: Patient remains intubated per anesthesia plan  Post-op Pain: none  Post-op Assessment: Post-op Vital signs reviewed, Patient's Cardiovascular Status Stable, Respiratory Function Stable and Patent Airway  Post-op Vital Signs: Reviewed and stable  Complications: No apparent anesthesia complications

## 2011-07-10 NOTE — Brief Op Note (Signed)
07/07/2011 - 07/10/2011  11:59 AM  PATIENT:  Dean Cole  61 y.o. male  PRE-OPERATIVE DIAGNOSIS:  1.S/p NSTEMI 2.Coronary Artery Disease  POST-OPERATIVE DIAGNOSIS:  1.S/p NSTEMI 2.Coronary Artery Disease  PROCEDURE:   CORONARY ARTERY BYPASS GRAFTING (CABG) x 4 (LIMA to LAD, SVG to Diagonal, SVG to Circumflex, SVG to PLB) with EVH from the right thigh and lower leg.  SURGEON:  Surgeon(s) and Role:    * Kerin Perna, MD - Primary  PHYSICIAN ASSISTANT: Doree Fudge PA-C  ANESTHESIA:   general  EBL:  Total I/O In: -  Out: 210 [Urine:210]  BLOOD ADMINISTERED:1 pack of  PLTS  DRAINS:  Chest Tube(s) in the mediastinal and pleural spaces   COUNTS CORRECT:  YES  DICTATION: .Dragon Dictation  PLAN OF CARE: Admit to inpatient   PATIENT DISPOSITION:  ICU - intubated and hemodynamically stable.   Delay start of Pharmacological VTE agent (>24hrs) due to surgical blood loss or risk of bleeding: yes  PRE OP WEIGHT: 90 kg

## 2011-07-10 NOTE — Procedures (Signed)
Extubation Procedure Note  Patient Details:   Name: ANDRIEL OMALLEY DOB: 1950/06/24 MRN: [id number]   Airway Documentation:     Evaluation  O2 sats: stable throughout and currently acceptable Complications: No apparent complications Patient did tolerate procedure well. Bilateral Breath Sounds: Clear   Yes Pt awake and alert, extubated per protocol, placed on 3L [place] sats 96%. Positive cuff leak, BBS cl, NIF -32, VC 900. Pt able to vocalize.  Arloa Koh 07/10/2011, 4:10 PM

## 2011-07-10 NOTE — Addendum Note (Signed)
Addendum  created 07/10/11 1435 by Masha Orbach R Demeco Ducksworth, CRNA   Modules edited:Anesthesia Flowsheet    

## 2011-07-10 NOTE — Transfer of Care (Signed)
Immediate Anesthesia Transfer of Care Note  Patient: Dean Cole  Procedure(s) Performed: Procedure(s) (LRB): CORONARY ARTERY BYPASS GRAFTING (CABG) (N/A)  Patient Location: ICU  Anesthesia Type: General  Level of Consciousness: awake  Airway & Oxygen Therapy: Patient Spontanous Breathing and Patient placed on Ventilator (see vital sign flow sheet for setting)  Post-op Assessment: Report given to PACU RN, Post -op Vital signs reviewed and stable and Patient moving all extremities  Post vital signs: Reviewed and stable  Complications: No apparent anesthesia complications

## 2011-07-10 NOTE — OR Nursing (Signed)
Patient had bilateral nipple piercing's, bilateral eyebrow piercing's, and a tongue ring that were removed by Elizbeth Squires, CRNA prior to surgery. The body piercing jewelry was placed in a bag with patient label on the bag, will attempt to place nipple rings postop and will send rest of jewelry with patient to SICU. Patient still has one upper lobe piercing in each ear that was kept in during surgery.

## 2011-07-10 NOTE — OR Nursing (Signed)
Performed first timeout with PA at 0836 prior to leg incision at 0837. Mechelle Gengler performed second timeout with surgeon at [phone] prior to chest incision at [phone].

## 2011-07-10 NOTE — Addendum Note (Signed)
Addendum  created 07/10/11 1435 by Luster Landsberg, CRNA   Modules edited:Anesthesia Flowsheet

## 2011-07-11 ENCOUNTER — Inpatient Hospital Stay (HOSPITAL_COMMUNITY): Payer: Medicare Other

## 2011-07-11 LAB — GLUCOSE, CAPILLARY
Glucose-Capillary: 100 mg/dL — ABNORMAL HIGH (ref 70–99)
Glucose-Capillary: 110 mg/dL — ABNORMAL HIGH (ref 70–99)
Glucose-Capillary: 189 mg/dL — ABNORMAL HIGH (ref 70–99)

## 2011-07-11 LAB — CBC
HCT: 24.6 % — ABNORMAL LOW (ref 39.0–52.0)
HCT: 27.2 % — ABNORMAL LOW (ref 39.0–52.0)
Hemoglobin: 8.7 g/dL — ABNORMAL LOW (ref 13.0–17.0)
Hemoglobin: 9.3 g/dL — ABNORMAL LOW (ref 13.0–17.0)
MCHC: 35.4 g/dL (ref 30.0–36.0)
RBC: 2.79 MIL/uL — ABNORMAL LOW (ref 4.22–5.81)
WBC: 9.1 10*3/uL (ref 4.0–10.5)
WBC: 17 10*3/uL — ABNORMAL HIGH (ref 4.0–10.5)

## 2011-07-11 LAB — MAGNESIUM
Magnesium: 2.6 mg/dL — ABNORMAL HIGH (ref 1.5–2.5)
Magnesium: 2.7 mg/dL — ABNORMAL HIGH (ref 1.5–2.5)

## 2011-07-11 LAB — POCT I-STAT, CHEM 8
BUN: 27 mg/dL — ABNORMAL HIGH (ref 6–23)
Creatinine, Ser: 1.6 mg/dL — ABNORMAL HIGH (ref 0.50–1.35)
Glucose, Bld: 134 mg/dL — ABNORMAL HIGH (ref 70–99)
Hemoglobin: 9.2 g/dL — ABNORMAL LOW (ref 13.0–17.0)
Potassium: 4.5 mEq/L (ref 3.5–5.1)

## 2011-07-11 LAB — BASIC METABOLIC PANEL
CO2: 23 mEq/L (ref 19–32)
Chloride: 101 mEq/L (ref 96–112)
Potassium: 4.3 mEq/L (ref 3.5–5.1)
Sodium: 133 mEq/L — ABNORMAL LOW (ref 135–145)

## 2011-07-11 LAB — PREPARE PLATELET PHERESIS

## 2011-07-11 LAB — CREATININE, SERUM: GFR calc Af Amer: 53 mL/min — ABNORMAL LOW (ref >90)

## 2011-07-11 MED ORDER — TRAMADOL HCL 50 MG PO TABS
50.0000 mg | ORAL_TABLET | Freq: Four times a day (QID) | ORAL | Status: DC | PRN
  Administered 2011-07-11 – 2011-07-12 (×4): 50 mg via ORAL
  Filled 2011-07-11 (×4): qty 1

## 2011-07-11 MED ORDER — LACTULOSE 10 GM/15ML PO SOLN
30.0000 g | Freq: Every day | ORAL | Status: DC | PRN
  Filled 2011-07-11: qty 45

## 2011-07-11 MED ORDER — POLYETHYLENE GLYCOL 3350 17 G PO PACK
17.0000 g | PACK | Freq: Every day | ORAL | Status: DC
  Administered 2011-07-12: 17 g via ORAL
  Filled 2011-07-11 (×2): qty 1

## 2011-07-11 MED ORDER — DIAZEPAM 5 MG PO TABS
5.0000 mg | ORAL_TABLET | Freq: Two times a day (BID) | ORAL | Status: DC | PRN
  Administered 2011-07-11 – 2011-07-14 (×3): 5 mg via ORAL
  Filled 2011-07-11 (×2): qty 1

## 2011-07-11 MED ORDER — DIAZEPAM 5 MG PO TABS
ORAL_TABLET | ORAL | Status: AC
  Administered 2011-07-11: 5 mg via ORAL
  Filled 2011-07-11: qty 1

## 2011-07-11 MED ORDER — FUROSEMIDE 10 MG/ML IJ SOLN
40.0000 mg | Freq: Two times a day (BID) | INTRAMUSCULAR | Status: DC
  Administered 2011-07-11 – 2011-07-12 (×3): 40 mg via INTRAVENOUS
  Filled 2011-07-11 (×5): qty 4

## 2011-07-11 MED ORDER — INSULIN ASPART 100 UNIT/ML ~~LOC~~ SOLN: 0.0000 [IU] | SUBCUTANEOUS | Status: DC

## 2011-07-11 MED FILL — Magnesium Sulfate Inj 50%: INTRAMUSCULAR | Qty: 10 | Status: AC

## 2011-07-11 MED FILL — Lactated Ringer's Solution: INTRAVENOUS | Qty: 500 | Status: AC

## 2011-07-11 MED FILL — Verapamil HCl IV Soln 2.5 MG/ML: INTRAVENOUS | Qty: 4 | Status: AC

## 2011-07-11 MED FILL — Potassium Chloride Inj 2 mEq/ML: INTRAVENOUS | Qty: 40 | Status: AC

## 2011-07-11 MED FILL — Nitroglycerin IV Soln 5 MG/ML: INTRAVENOUS | Qty: 10 | Status: AC

## 2011-07-11 MED FILL — Heparin Sodium (Porcine) Inj 1000 Unit/ML: INTRAMUSCULAR | Qty: 10 | Status: AC

## 2011-07-11 NOTE — Progress Notes (Signed)
Patient Name: Dean Cole Date of Encounter: 07/11/2011     Principal Problem:  *Unstable angina Active Problems:  HTN (hypertension)  Hypothyroidism  Coronary artery disease  Borderline diabetes  Restless leg syndrome    SUBJECTIVE: Chest soreness and tenderness at sternotomy and chest tube sites. States breathing is short due to pain. No lightheadedness.   OBJECTIVE  Filed Vitals:   07/11/11 0500 07/11/11 0600 07/11/11 0700 07/11/11 0822  BP: 109/54 99/60 109/59   Pulse: 84 86 86   Temp: 100 F (37.8 C) 100.2 F (37.9 C) 100 F (37.8 C) 100 F (37.8 C)  TempSrc:    Core (Comment)  Resp: 27 33 29   Height:      Weight: 100.2 kg (220 lb 14.4 oz)     SpO2: 99% 95% 95%     Intake/Output Summary (Last 24 hours) at 07/11/11 0915 Last data filed at 07/11/11 0800  Gross per 24 hour  Intake 6682.74 ml  Output   3935 ml  Net 2747.74 ml   Weight change: 0 kg (0 lb)  PHYSICAL EXAM  General: Well developed, well nourished, in no acute distress. Head: Normocephalic, atraumatic, sclera non-icteric, no xanthomas, bilateral eye brow piercings. Neck: Supple without bruits or JVD. Lungs:  Short, mildly tachypneic. Trace bilateral rales, no wheezing or rhonchi noted.  Heart: RRR no s3, s4, or murmurs, rubs, heaves, gallops or thrills Abdomen: Soft, non-tender, non-distended, BS + x 4.  Msk:  Strength and tone appears normal for age. Extremities: RLE incisions without erythema, edema or drainage. No clubbing, cyanosis or edema. DP/PT/Radials 2+ and equal bilaterally. Neuro: Alert and oriented X 3. Moves all extremities spontaneously. Psych: Normal affect.  LABS:  Recent Labs  [facility] 07/11/11 0350 07/10/11 2000   WBC 9.1 9.5   HGB 8.7* 9.3*   HCT 24.6* 26.4*   MCV 88.2 87.1   PLT 128* 135*    Lab 07/11/11 0350 07/10/11 2000 07/10/11 1950 07/10/11 1428 07/09/11 0900 07/08/11 1006  NA 133* -- 139 139 -- --  K 4.3 -- 4.7 3.7 -- --  CL 101 -- 107 -- 99 --    CO2 23 -- -- -- 31 29  BUN 22 -- 25* -- 23 --  CREATININE 1.19 1.33 1.60* -- -- --  CALCIUM 7.1* -- -- -- 9.5 9.2  PROT -- -- -- -- -- --  BILITOT -- -- -- -- -- --  ALKPHOS -- -- -- -- -- --  ALT -- -- -- -- -- --  AST -- -- -- -- -- --  AMYLASE -- -- -- -- -- --  LIPASE -- -- -- -- -- --  GLUCOSE 114* -- 116* 136* -- --   Recent Labs  Basename 07/09/11 0900   CHOL 200   HDL 31*   LDLCALC 107*   TRIG 310*   CHOLHDL 6.5   LDLDIRECT --    Basename 07/08/11 1742  TSH 4.326  T4TOTAL --  T3FREE --  THYROIDAB --   TELE: NSR 80-100 bpm, no ST-T wave changes; A-paced yesterday with ventricular triplets noted (during CABG)  ECG: NSR, 86 bpm, no ST-T wave changes  Radiology/Studies:  Dg Chest 2 View  07/08/2011  *RADIOLOGY REPORT*  Clinical Data: Pre CABG evaluation.  Ex-smoker.  CHEST - 2 VIEW  Comparison: None.  Findings: Normal sized heart.  Linear density in both lower lung zones.  Minimal central peribronchial thickening.  Bilateral nipple studs.  Mild scoliosis.  IMPRESSION: Bibasilar linear scarring  and minimal chronic bronchitic changes. No acute abnormality.  Original Report Authenticated By: Darrol Angel, M.D.   Dg Chest Portable 1 View In Am  07/11/2011  *RADIOLOGY REPORT*  Clinical Data: Postop for median sternotomy.  Hypertension. Coronary artery disease.  PORTABLE CHEST - 1 VIEW  Comparison: 07/10/2011  Findings: Extubation.  Removal of nasogastric tube.  Right-sided Swan-Ganz catheter with tip at proximal right pulmonary artery.  Prior median sternotomy.  A mediastinal drain and left chest tube remain in place.  Severe mediastinal soft tissue fullness persists.  This could be due to low lung volumes and AP portable technique.  Small left greater right pleural effusions are increased. No pneumothorax. Low lung volumes.  Persistent bibasilar airspace disease.  Mild pulmonary venous congestion.  IMPRESSION:  1.  Removal of endotracheal and nasogastric tubes. 2.  Left chest  tube remaining in place, without pneumothorax. 3.  Similar apparent soft tissue fullness of the superior mediastinum.  Possibly technique related. Recommend attention on follow-up. 4.  Persistent bibasilar atelectasis.  Original Report Authenticated By: Consuello Bossier, M.D.   Dg Chest Portable 1 View  07/10/2011  *RADIOLOGY REPORT*  Clinical Data: Postop from open heart surgery.  PORTABLE CHEST - 1 VIEW  Comparison: 07/08/2011  Findings: 1421 hours.  Endotracheal tube tip is 5.8 cm above the base of the carina.  NG tube tip overlies the mid stomach.  Left chest tube noted without evidence for pneumothorax.  Right IJ pulmonary artery catheter tip is in the right main pulmonary artery.  Cardiopericardial silhouette is enlarged and mediastinal contours are increased compared the preoperative film.  Attention on follow- up imaging recommended.  There is bibasilar atelectasis with vascular congestion.  Midline pericardial/mediastinal drain is poorly visualized.  IMPRESSION: Cardiomegaly with prominence of the mediastinum on this immediate postoperative study.  Attention on follow-up films recommended.  Bilateral atelectasis with vascular congestion.  Original Report Authenticated By: ERIC A. MANSELL, M.D.   Current Medications:     . acetaminophen (TYLENOL) oral liquid 160 mg/5 mL  650 mg Per Tube NOW   Or  . acetaminophen  650 mg Rectal NOW  . acetaminophen  1,000 mg Oral Q6H   Or  . acetaminophen (TYLENOL) oral liquid 160 mg/5 mL  975 mg Per Tube Q6H  . aspirin EC  325 mg Oral Daily   Or  . aspirin  324 mg Per Tube Daily  . atorvastatin  80 mg Oral q1800  . bisacodyl  10 mg Oral Daily   Or  . bisacodyl  10 mg Rectal Daily  . cefUROXime (ZINACEF)  IV  1.5 g Intravenous Q12H  . docusate sodium  200 mg Oral Daily  . furosemide  40 mg Intravenous BID  . insulin aspart  0-24 Units Subcutaneous Q4H  . insulin regular  0-10 Units Intravenous TID WC  . levothyroxine  150 mcg Oral Q breakfast  .  magnesium sulfate  4 g Intravenous Once  . metoprolol tartrate  12.5 mg Oral BID   Or  . metoprolol tartrate  12.5 mg Per Tube BID  . midazolam      . pantoprazole  40 mg Oral Q1200  . potassium chloride  10 mEq Intravenous Q1 Hr x 3  . sodium chloride  3 mL Intravenous Q12H  . vancomycin (VANCOCIN) IVPB 1000 mg/100 mL central line  1,000 mg Intravenous Once  . DISCONTD: acetaminophen (TYLENOL) oral liquid 160 mg/5 mL  975 mg Per Tube Q6H  . DISCONTD: acetaminophen  1,000  mg Oral Q6H  . DISCONTD: aminocaproic acid (AMICAR) for OHS   Intravenous To OR  . DISCONTD: aminocaproic acid (AMICAR) IVPB  1 g/hr Intravenous To OR  . DISCONTD: aspirin EC  81 mg Oral Daily  . DISCONTD: cefUROXime (ZINACEF)  IV  1.5 g Intravenous To OR  . DISCONTD: diazepam  5 mg Oral QID  . DISCONTD: famotidine (PEPCID) IV  20 mg Intravenous Q12H  . DISCONTD: hydrochlorothiazide  12.5 mg Oral Daily  . DISCONTD: insulin aspart  0-24 Units Subcutaneous Q4H  . DISCONTD: irbesartan  300 mg Oral Daily  . DISCONTD: metoprolol tartrate  25 mg Oral BID  . DISCONTD: nitroGLYCERIN  2-200 mcg/min Intravenous To OR  . DISCONTD: pentazocine-naloxone  1 tablet Oral BID    ASSESSMENT AND PLAN:  1. CAD/unstable angina- s/p CABG x 4 yesterday. He c/o expected chest tenderness. Mediastinal, pleural drains in place. Sternotomy and RLE vein harvesting incisions without evidence of infection. Bibasilar rales on exam, CXR this AM revealed pulmonary vascular congestion with bilateral pleural effusions. Pt has preserved LVEF at 55-60% without diastolic dysfunction on echo this admission. EKG this AM reveals NSR without evidence of ischemia. Stable on POD 1.   - Continue ASA/BB/statin  - Continue Lasix 40mg  IV BID, supplement KCl   - Will continue to monitor I/Os, daily weights  2. HTN- well-controlled  - Continue BB  3. Hyperlipidemia- LDL 107  - Continue Lipitor  4. GERD  - Continue PPI  5. Hypothyroidism  - Continue  Synthroid  Signed, R. Hurman Horn, PA-C 07/11/2011, 9:15 AM

## 2011-07-11 NOTE — Progress Notes (Signed)
The patient was examined on evening rounds. The patient remains hemostatically stable postop day 1 following CABG x4. The creatinine this evening is slightly increased to 1.6. His urine output is adequate. Stable day.

## 2011-07-11 NOTE — Progress Notes (Signed)
Patient seen with PA, agree with the above note.   Marca Ancona 07/11/2011 12:39 PM

## 2011-07-11 NOTE — Progress Notes (Signed)
UR Completed.  Athea Haley Jane [phone] 07/11/2011  

## 2011-07-11 NOTE — Progress Notes (Signed)
1 Day Post-Op Procedure(s) (LRB): CORONARY ARTERY BYPASS GRAFTING (CABG) (N/A) Subjective:                       [address]            [place] [address]          [phone]     POD ! CABGx4 Stable night  Objective: Vital signs in last 24 hours: Temp:  [95.5 F (35.3 C)-100.2 F (37.9 C)] 100 F (37.8 C) (03/12 0700) Pulse Rate:  [82-94] 86  (03/12 0700) Cardiac Rhythm:  [-] Atrial paced (03/12 0400) Resp:  [15-33] 29  (03/12 0700) BP: (94-125)/(48-71) 109/59 mmHg (03/12 0700) SpO2:  [71 %-100 %] 95 % (03/12 0700) Arterial Line BP: (83-145)/(42-63) 137/54 mmHg (03/12 0700) FiO2 (%):  [39.7 %-50.3 %] 40.2 % (03/11 1600) Weight:  [198 lb 3.1 oz (89.9 kg)-220 lb 14.4 oz (100.2 kg)] 220 lb 14.4 oz (100.2 kg) (03/12 0500)  Hemodynamic parameters for last 24 hours: PAP: (30-49)/(7-29) 47/19 mmHg CO:  [5.3 L/min-7.2 L/min] 5.3 L/min CI:  [2.6 L/min/m2-3.4 L/min/m2] 2.6 L/min/m2  Intake/Output from previous day: 03/11 0701 - 03/12 0700 In: 6542.7 [P.O.:180; I.V.:4218.7; Blood:944; IV Piggyback:1200] Out: 3840 [Urine:2615; Emesis/NG output:20; Blood:500; Chest Tube:705] Intake/Output this shift:    EXAM Neuro intact Patient comfortable Lungs clear Strong pulses  Lab Results:  Basename 07/11/11 0350 07/10/11 2000  WBC 9.1 9.5  HGB 8.7* 9.3*  HCT 24.6* 26.4*  PLT 128* 135*   BMET:  Basename 07/11/11 0350 07/10/11 2000 07/10/11 1950 07/09/11 0900  NA 133* -- 139 --  K 4.3 -- 4.7 --  CL 101 -- 107 --  CO2 23 -- -- 31  GLUCOSE 114* -- 116* --  BUN 22 -- 25* --  CREATININE 1.19 1.33 -- --  CALCIUM 7.1* -- -- 9.5    PT/INR:  Basename 07/10/11 1418  LABPROT 20.2*  INR 1.69*   ABG    Component Value Date/Time   PHART 7.318* 07/10/2011 1854   HCO3 24.8* 07/10/2011 1854   TCO2 25 07/10/2011 1950   ACIDBASEDEF 1.0 07/10/2011 1854   O2SAT 98.0 07/10/2011 1854   CBG (last 3)   Basename 07/10/11 2353 07/10/11 2300 07/10/11 2207  GLUCAP 100* 112*  96    Assessment/Plan: S/P Procedure(s) (LRB): CORONARY ARTERY BYPASS GRAFTING (CABG) (N/A) See progression orders IV lasix,mobilize OOB  LOS: 4 days    Dean Cole,Dean Cole 07/11/2011

## 2011-07-11 NOTE — Op Note (Signed)
NAMEMarland Cole  EARLEY, GROBE NO.:  [id number]  MEDICAL RECORD NO.:  [id number]  LOCATION:  2307                         FACILITY:  [facility]  PHYSICIAN:  Kerin Perna, M.D.  DATE OF BIRTH:  November 28, 1950  DATE OF PROCEDURE:  07/10/2011 DATE OF DISCHARGE:                              OPERATIVE REPORT   OPERATION: 1. Coronary artery bypass grafting x4 (left internal mammary artery to     left anterior descending artery, saphenous vein graft to diagonal,     saphenous vein graft to obtuse marginal, saphenous vein graft to     posterolateral branch of the right coronary artery). 2. Endoscopic harvest of right leg greater saphenous vein.  PREOPERATIVE DIAGNOSIS:  Severe left main and three-vessel coronary artery disease.  POSTOPERATIVE DIAGNOSIS:  Severe left main and three-vessel coronary artery disease.  SURGEON:  Kerin Perna, MD  ASSISTANT:  Doree Fudge, PA-C  ANESTHESIA:  General.  INDICATIONS:  The patient is a 61 year old gentleman who presents with unstable angina and was found by cardiac catheterization to have severe left main and three-vessel disease.  A 2D echo showed preserved LV systolic function without significant valvular disease.  He was recommended for surgical coronary revascularization, which I discussed with the patient and his wife in their hospital room following cardiac catheterization.  I discussed the major aspects of surgery including the use of general anesthesia, the use of cardiopulmonary bypass, the location of the surgical incisions, and the expected postoperative hospital recovery.  I discussed with the patient the risks to him of the operation including risks of MI, stroke, bleeding, blood transfusion requirement, infection, and death.  After reviewing these issues, he demonstrated his understanding and agreed to proceed with the surgery under what I felt was an informed consent.  FINDINGS: 1. Preserved LV systolic  function. 2. Adequate conduit. 3. Adequate targets, some diffuse disease of the LAD. 4. Intraoperative anemia with a hemoglobin of 6.8, requiring 1 unit of     packed cells.  PROCEDURE:  The patient was brought to the operating room and placed supine on the operating room table where general anesthesia was induced under invasive hemodynamic monitoring.  A transesophageal 2-D echo probe was placed by the anesthesiologist.  The patient was prepped and draped as a sterile field and a proper time-out was performed.  A sternal incision was made as the saphenous vein was harvested endoscopically from the right leg.  The left internal mammary artery was harvested as a pedicle graft from its origin at the subclavian vessels.  The sternal retractor was placed and the pericardium opened and suspended.  When the vein was harvested, the heparin was administered and the ACT was documented as being therapeutic.  Pursestrings were placed in the ascending aorta and right atrium and the patient was cannulated and placed on cardiopulmonary bypass.  The coronary arteries were identified for grafting and cardioplegia cannulas were placed for antegrade and retrograde cold blood cardioplegia.  The patient was cooled to 32 degrees and aortic crossclamp was applied.  An 850 mL of cold blood cardioplegia was delivered in split doses between the antegrade aortic and retrograde coronary sinus catheters.  There was good cardioplegic arrest  and septal temperature dropped less than 14 degrees. Cardioplegia was delivered every 20 minutes or less. The distal coronary anastomoses were then performed.  The first distal anastomosis was to the posterolateral branch of the right coronary artery.  This is a 1.5-mm vessel with proximal 70% stenosis.  A reverse saphenous vein was sewn end-to-side with running 7-0 Prolene with good flow through the graft.  The second distal anastomosis was the obtuse marginal branch of the  circumflex.  This had a proximal 80% left main stenosis.  A reverse saphenous vein was sewn end-to-side with running 7- 0 Prolene with excellent flow through the graft.  Cardioplegia was redosed.  The third distal anastomosis was to the first diagonal branch of the LAD.  This had a proximal 60% stenosis at its origin and a reverse saphenous vein was sewn end-to-side with running 7-0 Prolene with good flow through the graft.  Cardioplegia was redosed.  The fourth distal anastomosis was to the distal third to the LAD.  It had a proximal 80-90% left main stenosis.  The left IMA pedicle was brought through an opening in the left lateral pericardium, was brought down onto the LAD and sewn end-to-side with running 8-0 Prolene.  There was excellent flow through the anastomosis after briefly releasing the pedicle bulldog and the mammary artery, and the bulldog was replaced and the pericardial secured to the epicardium with 6-0 Prolene. Cardioplegia was redosed.  While the crossclamp was in place, three proximal vein anastomoses were performed using a running 7-0 Prolene and a 4.5-mm punch.  Air was vented from the coronary arteries with the dose of retrograde warm blood cardioplegia and the crossclamp was removed.  The heart resumed a spontaneous rhythm.  The grafts were de-aired and opened and each had good flow.  Hemostasis was documented at the proximal and distal anastomoses, and the patient was then rewarmed.  Temporary pacing wires were applied.  The lungs were expanded and the ventilator was resumed. The patient was weaned off bypass without difficulty with good hemodynamics and good LV function on TEE.  Protamine was administered without adverse reaction.  The cannula was removed.  The mediastinum was irrigated with warm saline.  The superior pericardial fat was closed. The anterior mediastinal and the left pleural chest tube were placed and brought out through separate incisions.  The  sternum was closed with interrupted steel wire.  The pectoralis fascia was closed in running Vicryl.  Subcutaneous and skin were closed in running Vicryl and sterile dressings were applied.  Total cardiopulmonary bypass time was 130 minutes.     Kerin Perna, M.D.     PV/MEDQ  D:  07/10/2011  T:  07/11/2011  Job:  [id number]  cc:   Verne Carrow, MD

## 2011-07-12 ENCOUNTER — Inpatient Hospital Stay (HOSPITAL_COMMUNITY): Payer: Medicare Other

## 2011-07-12 ENCOUNTER — Encounter (HOSPITAL_COMMUNITY): Payer: Self-pay | Admitting: Cardiothoracic Surgery

## 2011-07-12 LAB — BASIC METABOLIC PANEL
BUN: 28 mg/dL — ABNORMAL HIGH (ref 6–23)
CO2: 28 mEq/L (ref 19–32)
Calcium: 7.7 mg/dL — ABNORMAL LOW (ref 8.4–10.5)
Chloride: 96 mEq/L (ref 96–112)
Creatinine, Ser: 1.44 mg/dL — ABNORMAL HIGH (ref 0.50–1.35)
GFR calc Af Amer: 60 mL/min — ABNORMAL LOW (ref >90)
GFR calc non Af Amer: 51 mL/min — ABNORMAL LOW (ref >90)
Glucose, Bld: 126 mg/dL — ABNORMAL HIGH (ref 70–99)
Potassium: 3.8 mEq/L (ref 3.5–5.1)
Sodium: 131 mEq/L — ABNORMAL LOW (ref 135–145)

## 2011-07-12 LAB — TYPE AND SCREEN
ABO/RH(D): A POS
Antibody Screen: NEGATIVE
Unit division: 0
Unit division: 0

## 2011-07-12 LAB — GLUCOSE, CAPILLARY
Glucose-Capillary: 112 mg/dL — ABNORMAL HIGH (ref 70–99)
Glucose-Capillary: 134 mg/dL — ABNORMAL HIGH (ref 70–99)

## 2011-07-12 LAB — CBC
HCT: 23.7 % — ABNORMAL LOW (ref 39.0–52.0)
Hemoglobin: 8.4 g/dL — ABNORMAL LOW (ref 13.0–17.0)
MCH: 31.3 pg (ref 26.0–34.0)
MCHC: 35.4 g/dL (ref 30.0–36.0)
MCV: 88.4 fL (ref 78.0–100.0)
Platelets: 133 10*3/uL — ABNORMAL LOW (ref 150–400)
RBC: 2.68 MIL/uL — ABNORMAL LOW (ref 4.22–5.81)
RDW: 13.7 % (ref 11.5–15.5)
WBC: 13.3 10*3/uL — ABNORMAL HIGH (ref 4.0–10.5)

## 2011-07-12 MED ORDER — GUAIFENESIN ER 600 MG PO TB12
600.0000 mg | ORAL_TABLET | Freq: Two times a day (BID) | ORAL | Status: DC | PRN
  Administered 2011-07-13: 600 mg via ORAL
  Filled 2011-07-12: qty 1

## 2011-07-12 MED ORDER — POTASSIUM CHLORIDE CRYS ER 20 MEQ PO TBCR
20.0000 meq | EXTENDED_RELEASE_TABLET | Freq: Every day | ORAL | Status: DC
Start: 2011-07-12 — End: 2011-07-15
  Administered 2011-07-12 – 2011-07-15 (×4): 20 meq via ORAL
  Filled 2011-07-12 (×3): qty 1

## 2011-07-12 MED ORDER — LACTULOSE 10 GM/15ML PO SOLN
30.0000 g | Freq: Every day | ORAL | Status: DC
  Administered 2011-07-12: 30 g via ORAL
  Filled 2011-07-12 (×2): qty 45

## 2011-07-12 MED ORDER — PENTAZOCINE-NALOXONE HCL 50-0.5 MG PO TABS
1.0000 | ORAL_TABLET | ORAL | Status: DC | PRN
  Administered 2011-07-12 (×2): 1 via ORAL
  Filled 2011-07-12 (×2): qty 1

## 2011-07-12 MED ORDER — PHENOL 1.4 % MT LIQD
1.0000 | OROMUCOSAL | Status: DC | PRN
  Administered 2011-07-12: 1 via OROMUCOSAL
  Filled 2011-07-12 (×2): qty 177

## 2011-07-12 MED ORDER — INSULIN ASPART 100 UNIT/ML ~~LOC~~ SOLN
0.0000 [IU] | Freq: Three times a day (TID) | SUBCUTANEOUS | Status: DC
Start: 2011-07-12 — End: 2011-07-13
  Administered 2011-07-13: 2 [IU] via SUBCUTANEOUS

## 2011-07-12 MED ORDER — SIMVASTATIN 20 MG PO TABS
20.0000 mg | ORAL_TABLET | Freq: Every day | ORAL | Status: DC
  Administered 2011-07-12 – 2011-07-14 (×3): 20 mg via ORAL
  Filled 2011-07-12 (×7): qty 1

## 2011-07-12 MED ORDER — FUROSEMIDE 40 MG PO TABS
40.0000 mg | ORAL_TABLET | Freq: Every day | ORAL | Status: DC
  Administered 2011-07-13 – 2011-07-15 (×3): 40 mg via ORAL
  Filled 2011-07-12 (×3): qty 1

## 2011-07-12 MED ORDER — POLYETHYLENE GLYCOL 3350 17 G PO PACK
34.0000 g | PACK | Freq: Every day | ORAL | Status: DC
  Administered 2011-07-12: 17 g via ORAL
  Administered 2011-07-13 – 2011-07-14 (×2): 34 g via ORAL
  Filled 2011-07-12 (×4): qty 2

## 2011-07-12 MED ORDER — FUROSEMIDE 10 MG/ML IJ SOLN
40.0000 mg | Freq: Once | INTRAMUSCULAR | Status: AC
  Filled 2011-07-12: qty 4

## 2011-07-12 MED ORDER — MOVING RIGHT ALONG BOOK
Freq: Once | Status: AC
  Administered 2011-07-12: 18:00:00
  Filled 2011-07-12: qty 1

## 2011-07-12 MED ORDER — SODIUM CHLORIDE 0.9 % IJ SOLN
3.0000 mL | Freq: Two times a day (BID) | INTRAMUSCULAR | Status: DC
  Administered 2011-07-12 – 2011-07-15 (×6): 3 mL via INTRAVENOUS

## 2011-07-12 NOTE — Progress Notes (Signed)
2 Days Post-Op Procedure(s) (LRB): CORONARY ARTERY BYPASS GRAFTING (CABG) (N/A) Subjective: Out of bed to chair sinus rhythm-stable vital signs  Complains of constipation and gas Ambulating well in the hallway Complains that pain medication is not working and requesting TALWIN and which he takes at home  Objective: Vital signs in last 24 hours: Temp:  [98 F (36.7 C)-98.9 F (37.2 C)] 98 F (36.7 C) (03/13 0800) Pulse Rate:  [77-91] 91  (03/13 0900) Cardiac Rhythm:  [-] Normal sinus rhythm (03/13 0800) Resp:  [15-29] 24  (03/13 0900) BP: (88-129)/(40-72) 129/72 mmHg (03/13 0900) SpO2:  [90 %-99 %] 95 % (03/13 0900) Arterial Line BP: (104)/(58) 104/58 mmHg (03/12 1100) Weight:  [221 lb 9 oz (100.5 kg)] 221 lb 9 oz (100.5 kg) (03/13 0600)  Hemodynamic parameters for last 24 hours:  normal sinus rhythm, O2 sat 95 on 4 L Intake/Output from previous day: 03/12 0701 - 03/13 0700 In: 1198 [P.O.:580; I.V.:510; IV Piggyback:108] Out: 3060 [Urine:2650; Chest Tube:410] Intake/Output this shift: Total I/O In: 40 [I.V.:40] Out: 345 [Urine:345]  Exam   alert and comfortable Breath sounds clear Surgical incisions dry and clean Abdomen slightly distended, bowel sounds active Minimal pedal edema  Lab Results:  Basename 07/12/11 0400 07/11/11 1801 07/11/11 1745  WBC 13.3* -- 17.0*  HGB 8.4* 9.2* --  HCT 23.7* 27.0* --  PLT 133* -- 171   BMET:  Basename 07/12/11 0400 07/11/11 1801 07/11/11 0350  NA 131* 134* --  K 3.8 4.5 --  CL 96 98 --  CO2 28 -- 23  GLUCOSE 126* 134* --  BUN 28* 27* --  CREATININE 1.44* 1.60* --  CALCIUM 7.7* -- 7.1*    PT/INR:  Basename 07/10/11 1418  LABPROT 20.2*  INR 1.69*   ABG    Component Value Date/Time   PHART 7.318* 07/10/2011 1854   HCO3 24.8* 07/10/2011 1854   TCO2 26 07/11/2011 1801   ACIDBASEDEF 1.0 07/10/2011 1854   O2SAT 98.0 07/10/2011 1854   CBG (last 3)   Basename 07/12/11 0729 07/11/11 2355 07/11/11 1944  GLUCAP 112* 134*  145*    Assessment/Plan: S/P Procedure(s) (LRB): CORONARY ARTERY BYPASS GRAFTING (CABG) (N/A) Plan for transfer to step-down: see transfer orders  anemia postop- patient constipated we'll hold oral iron for now  LOS: 5 days  VAN TRIGT III,Kiylah Loyer 07/12/2011

## 2011-07-12 NOTE — Progress Notes (Signed)
Pt walked from 2300 with nursing around 1200. Declined walking now. Sts he is in pain and tired. Encouraged IS and sitting up in recliner and 3 walks qd. Will f/u am. Ethelda Chick CES, ACSM

## 2011-07-13 ENCOUNTER — Encounter: Payer: Self-pay | Admitting: Cardiothoracic Surgery

## 2011-07-13 ENCOUNTER — Inpatient Hospital Stay (HOSPITAL_COMMUNITY): Payer: Medicare Other

## 2011-07-13 LAB — URINALYSIS, ROUTINE W REFLEX MICROSCOPIC
Bilirubin Urine: NEGATIVE
Glucose, UA: NEGATIVE mg/dL
Hgb urine dipstick: NEGATIVE
Ketones, ur: NEGATIVE mg/dL
Leukocytes, UA: NEGATIVE
Nitrite: NEGATIVE
Protein, ur: NEGATIVE mg/dL
Specific Gravity, Urine: 1.017 (ref 1.005–1.030)
Urobilinogen, UA: 1 mg/dL (ref 0.0–1.0)
pH: 5.5 (ref 5.0–8.0)

## 2011-07-13 LAB — CBC
HCT: 26.7 % — ABNORMAL LOW (ref 39.0–52.0)
Hemoglobin: 9.3 g/dL — ABNORMAL LOW (ref 13.0–17.0)
MCH: 30.8 pg (ref 26.0–34.0)
MCHC: 34.8 g/dL (ref 30.0–36.0)
MCV: 88.4 fL (ref 78.0–100.0)
Platelets: 204 10*3/uL (ref 150–400)
RBC: 3.02 MIL/uL — ABNORMAL LOW (ref 4.22–5.81)
RDW: 13.6 % (ref 11.5–15.5)
WBC: 18.3 10*3/uL — ABNORMAL HIGH (ref 4.0–10.5)

## 2011-07-13 LAB — BASIC METABOLIC PANEL
BUN: 27 mg/dL — ABNORMAL HIGH (ref 6–23)
CO2: 29 mEq/L (ref 19–32)
Calcium: 8.4 mg/dL (ref 8.4–10.5)
Chloride: 96 mEq/L (ref 96–112)
Creatinine, Ser: 1.36 mg/dL — ABNORMAL HIGH (ref 0.50–1.35)
GFR calc Af Amer: 64 mL/min — ABNORMAL LOW (ref >90)
GFR calc non Af Amer: 55 mL/min — ABNORMAL LOW (ref >90)
Glucose, Bld: 131 mg/dL — ABNORMAL HIGH (ref 70–99)
Potassium: 3.8 mEq/L (ref 3.5–5.1)
Sodium: 134 mEq/L — ABNORMAL LOW (ref 135–145)

## 2011-07-13 LAB — GLUCOSE, CAPILLARY: Glucose-Capillary: 115 mg/dL — ABNORMAL HIGH (ref 70–99)

## 2011-07-13 MED ORDER — OXYCODONE HCL 5 MG PO TABS
10.0000 mg | ORAL_TABLET | ORAL | Status: DC | PRN
  Administered 2011-07-13 – 2011-07-15 (×12): 10 mg via ORAL
  Filled 2011-07-13 (×12): qty 2

## 2011-07-13 MED FILL — Sodium Chloride Irrigation Soln 0.9%: Qty: 3000 | Status: AC

## 2011-07-13 MED FILL — Mannitol IV Soln 20%: INTRAVENOUS | Qty: 500 | Status: AC

## 2011-07-13 MED FILL — Albumin, Human Inj 5%: INTRAVENOUS | Qty: 250 | Status: AC

## 2011-07-13 MED FILL — Electrolyte-R (PH 7.4) Solution: INTRAVENOUS | Qty: 8000 | Status: AC

## 2011-07-13 MED FILL — Heparin Sodium (Porcine) Inj 1000 Unit/ML: INTRAMUSCULAR | Qty: 30 | Status: AC

## 2011-07-13 MED FILL — Lidocaine HCl IV Inj 20 MG/ML: INTRAVENOUS | Qty: 5 | Status: AC

## 2011-07-13 MED FILL — Heparin Sodium (Porcine) Inj 1000 Unit/ML: INTRAMUSCULAR | Qty: 20 | Status: AC

## 2011-07-13 MED FILL — Sodium Bicarbonate IV Soln 8.4%: INTRAVENOUS | Qty: 50 | Status: AC

## 2011-07-13 MED FILL — Sodium Chloride IV Soln 0.9%: INTRAVENOUS | Qty: 1000 | Status: AC

## 2011-07-13 NOTE — Progress Notes (Addendum)
3 Days Post-Op Procedure(s) (LRB): CORONARY ARTERY BYPASS GRAFTING (CABG) (N/A)  Subjective: Patient with several small bowel movements last evening. He has some diffuse abdominal discomfort but nausea or emesis.  Objective: Vital signs in last 24 hours: Patient Vitals for the past 24 hrs:  BP Temp Temp src Pulse Resp SpO2 Weight  07/13/11 0510 118/70 mmHg 98.8 F (37.1 C) Oral 93  17  98 % -  07/13/11 0500 - - - - - - 215 lb 9.8 oz (97.8 kg)  07/12/11 2206 - 98.8 F (37.1 C) - - - - -  07/12/11 2003 117/71 mmHg 101 F (38.3 C) Oral 95  18  92 % -  07/12/11 1700 - - - - - 93 % -  07/12/11 1450 110/61 mmHg 99.1 F (37.3 C) Oral 89  18  92 % -  07/12/11 1145 127/71 mmHg 97.8 F (36.6 C) Oral 89  16  91 % -  07/12/11 1100 115/55 mmHg - - 87  23  95 % -  07/12/11 1000 129/58 mmHg - - 91  23  95 % -  07/12/11 0900 129/72 mmHg - - 91  24  95 % -  07/12/11 0800 121/50 mmHg 98 F (36.7 C) Oral 82  25  94 % -   Pre op weight  90 kg Current Weight  07/13/11 215 lb 9.8 oz (97.8 kg)      Intake/Output from previous day: 03/13 0701 - 03/14 0700 In: 280 [P.O.:240; I.V.:40] Out: 795 [Urine:795]   Physical Exam:  Cardiovascular: RRR, no murmurs, gallops, or rubs. Pulmonary: Diminished at bases; no rales, wheezes, or rhonchi. Abdomen: Soft,some diffuse tenderness, tympanic, hyperactive bowel sounds present. Extremities: Bilateral lower extremity edema. Wounds: Clean and dry.  No erythema or signs of infection.  Lab Results: CBC: Basename 07/13/11 0650 07/12/11 0400  WBC 18.3* 13.3*  HGB 9.3* 8.4*  HCT 26.7* 23.7*  PLT 204 133*   BMET:  Basename 07/12/11 0400 07/11/11 1801 07/11/11 0350  NA 131* 134* --  K 3.8 4.5 --  CL 96 98 --  CO2 28 -- 23  GLUCOSE 126* 134* --  BUN 28* 27* --  CREATININE 1.44* 1.60* --  CALCIUM 7.7* -- 7.1*    PT/INR:  Basename 07/10/11 1418  LABPROT 20.2*  INR 1.69*   ABG:  INR: Will add last result for INR, ABG once components are  confirmed Will add last 4 CBG results once components are confirmed  Assessment/Plan:  1. CV - SR.Continue Lopressor 12.5 bid. 2.  Pulmonary - Encourage incentive spirometer.CXR this am shows bilateral pleural effusions R>L, no pneumothorax, some pulmonary vascular congestion, and improving aeration. 3. Volume Overload - Continue with diuresis. 4.  Acute blood loss anemia - H/H slightly increased this am to 9.3/26.7. 5.Thrombocytopenia resolved as platelets increased to 204,000. 6.Leukocytosis-WBC increased from 13.3 to 18.3. Remains afebrile.Will check a UA. 7.GI-Probable post op ileus.KUB shows marked dilatation of bowel.Continue clear liquids for now.  8.Pre op HGA1C 5.6.CBGs 128/112/125.Will stop SS and glucose checks.  9.Mild ARI-Cr decreased to 1.36.  ZIMMERMAN,DONIELLE MPA-C 07/13/2011   patient examined and medical record reviewed,agree with above note. VAN TRIGT III,Lelon Ikard 07/13/2011

## 2011-07-13 NOTE — Progress Notes (Signed)
CARDIAC REHAB PHASE I   PRE:  Rate/Rhythm: 92 Sr    BP: sitting 16109    SaO2: 85-92 RA  MODE:  Ambulation: 300 ft   POST:  Rate/Rhythm: 112    BP: sitting 140/64     SaO2: 98 RA  Pt used RW assist x1. No O2 needed, SaO2 95-98 RA walking. Might need O2 in bed, did not check lying down. C/o weakness but overall doing well. To recliner.  [id number]  Dean Cole CES, ACSM

## 2011-07-14 ENCOUNTER — Other Ambulatory Visit: Payer: Self-pay

## 2011-07-14 DIAGNOSIS — I471 Supraventricular tachycardia, unspecified: Secondary | ICD-10-CM

## 2011-07-14 LAB — BASIC METABOLIC PANEL
Chloride: 93 mEq/L — ABNORMAL LOW (ref 96–112)
Creatinine, Ser: 1.29 mg/dL (ref 0.50–1.35)
GFR calc Af Amer: 68 mL/min — ABNORMAL LOW (ref >90)
Potassium: 3.7 mEq/L (ref 3.5–5.1)

## 2011-07-14 MED ORDER — ASPIRIN 325 MG PO TBEC: 325.0000 mg | DELAYED_RELEASE_TABLET | Freq: Every day | ORAL | Status: DC

## 2011-07-14 MED ORDER — DIAZEPAM 5 MG PO TABS
5.0000 mg | ORAL_TABLET | Freq: Four times a day (QID) | ORAL | Status: DC
  Administered 2011-07-14 – 2011-07-15 (×5): 5 mg via ORAL
  Filled 2011-07-14 (×5): qty 1

## 2011-07-14 MED ORDER — GUAIFENESIN ER 600 MG PO TB12: 600.0000 mg | ORAL_TABLET | Freq: Two times a day (BID) | ORAL | Status: DC | PRN

## 2011-07-14 MED ORDER — OXYCODONE HCL 10 MG PO TABS: 10.0000 mg | ORAL_TABLET | ORAL | Status: AC | PRN

## 2011-07-14 MED ORDER — POTASSIUM CHLORIDE CRYS ER 20 MEQ PO TBCR: 20.0000 meq | EXTENDED_RELEASE_TABLET | Freq: Every day | ORAL | Status: DC

## 2011-07-14 MED ORDER — FUROSEMIDE 40 MG PO TABS: 40.0000 mg | ORAL_TABLET | Freq: Every day | ORAL | Status: DC

## 2011-07-14 MED ORDER — SIMVASTATIN 20 MG PO TABS: 20.0000 mg | ORAL_TABLET | Freq: Every day | ORAL | Status: DC

## 2011-07-14 MED ORDER — DIAZEPAM 5 MG PO TABS: 5.0000 mg | ORAL_TABLET | Freq: Four times a day (QID) | ORAL | Status: DC | PRN

## 2011-07-14 MED ORDER — METOPROLOL TARTRATE 25 MG PO TABS
25.0000 mg | ORAL_TABLET | Freq: Two times a day (BID) | ORAL | Status: DC
  Administered 2011-07-14 – 2011-07-15 (×2): 25 mg via ORAL
  Filled 2011-07-14 (×3): qty 1

## 2011-07-14 NOTE — Progress Notes (Addendum)
4 Days Post-Op Procedure(s) (LRB): CORONARY ARTERY BYPASS GRAFTING (CABG) (N/A)  Subjective: Patient without nausea, abdominal pain, or emesis.  Objective: Vital signs in last 24 hours: Patient Vitals for the past 24 hrs:  BP Temp Temp src Pulse Resp SpO2 Weight  07/14/11 0543 130/78 mmHg - - 179  18  - -  07/14/11 0528 100/63 mmHg 98.5 F (36.9 C) Oral 111  18  94 % 213 lb (96.616 kg)  07/13/11 1900 110/61 mmHg 99.3 F (37.4 C) Oral 96  18  91 % -  07/13/11 1402 112/66 mmHg 98.3 F (36.8 C) Oral 87  18  90 % -   Pre op weight  90 kg Current Weight  07/14/11 213 lb (96.616 kg)      Intake/Output from previous day: 03/14 0701 - 03/15 0700 In: 1773 [P.O.:1770; I.V.:3] Out: 200 [Urine:200]   Physical Exam:  Cardiovascular: RRR, no murmurs, gallops, or rubs. Pulmonary: Diminished at bases; no rales, wheezes, or rhonchi. Abdomen: Soft,non tender, tympanic, hyperactive bowel sounds present. Extremities: Bilateral lower extremity edema. Wounds: Clean and dry.  No erythema or signs of infection.  Lab Results: CBC:  Basename 07/13/11 0650 07/12/11 0400  WBC 18.3* 13.3*  HGB 9.3* 8.4*  HCT 26.7* 23.7*  PLT 204 133*   BMET:   Basename 07/14/11 0630 07/13/11 0650  NA 132* 134*  K 3.7 3.8  CL 93* 96  CO2 29 29  GLUCOSE 128* 131*  BUN 27* 27*  CREATININE 1.29 1.36*  CALCIUM 8.6 8.4    PT/INR: No results found for this basename: LABPROT,INR in the last 72 hours ABG:  INR: Will add last result for INR, ABG once components are confirmed Will add last 4 CBG results once components are confirmed  Assessment/Plan:  1. CV - HR increased to 170's earlier and appeared to be SVT.SR this am.Will increase Lopressor to 25 bid. 2.  Pulmonary - Encourage incentive spirometer.CXR this am shows bilateral pleural effusions R>L, no pneumothorax, some pulmonary vascular congestion, and improving aeration. 3. Volume Overload - Continue with diuresis. 4.  Acute blood loss anemia  -Last  H/H  to 9.3/26.7. 5.Thrombocytopenia resolved as platelets increased to 204,000. 6.Leukocytosis-WBC increased from 13.3 to 18.3. Remains afebrile.UA is negative. 7.GI-Probable post op ileus.KUB showed marked dilatation of bowel.Advance diet as toelrates.  9.Mild ARI now resolved.Creatnine down to 1.29. 7.Patient has taken valium 5 mg po qid for years.Will resume. 8.Miralax for constipation.  ZIMMERMAN,DONIELLE MPA-C 07/14/2011    I have seen and examined Dean Cole and agree with the above assessment  and plan.  Delight Ovens MD Beeper [phone] Office [phone] 07/14/2011 1:27 PM

## 2011-07-14 NOTE — Discharge Summary (Signed)
Physician Discharge Summary  Patient ID: Dean Cole MRN: [id number] DOB/AGE: 1950-08-03 61 y.o.  Admit date: 07/07/2011 Discharge date: 07/15/2011  Admission Diagnoses: 1.Multivessel CAD 2.History of hypertension 3.History of pre diabetes 4.History of hypothyroidism 5.History of remote tobacco abuse  Discharge Diagnoses:  1.Multivessel CAD 2.History of hypertension 3.History of pre diabetes 4.History of hypothyroidism 5.History of remote tobacco abuse 6.Thrombocytopenia (resolved prior to discharge) 7.Post op ileus 8.Mild acute renal insufficiency (resolved prior to discharge) 9.ABL anemia  Procedure (s): 1.Cardiac Catheterization done by Dr. Sanjuana Kava on 07/07/2011. Angiographic Findings:  Left main: Distal left main has a hazy, eccentric 90% stenosis.  Left Anterior Descending Artery: Large vessel that courses to the apex. Mild non-obstructive plaque throughout the proximal and mid vessel. The diagonal is moderate sized with mild plaque disease.  Circumflex Artery: Small to moderate sized vessel with ostial 70% stenosis. This supplies a moderate sized OM branch that has mild plaque disease.  Right Coronary Artery: Large dominant vessel with 99% mid stenosis. There is a long, tubular distal 90% stenosis. Just before a smaller caliber PDA branch there is a 70% stenosis. The PLA supplies a territory and has mild plaque disease.  Left Ventricular Angiogram: LVEF 50-55%. The inferior wall has mild hypokinesis.  Impression:  1. Severe distal left main stenosis.  2. Severe disease mid and distal RCA  3. Preserved LV systolic function  4. Exertional angina  2.Coronary artery bypass grafting x4 (left internal mammary artery to  left anterior descending artery, saphenous vein graft to diagonal,  saphenous vein graft to obtuse marginal, saphenous vein graft to  posterolateral branch of the right coronary artery) with endoscopic harvest of right leg greater saphenous vein by Dr. Donata Clay on 07/10/2011.   History of Presenting Illness: This is a 61 year old Caucasian male who has a long history of exertional chest  pain, which has worsened significantly in the last few weeks in the cold  weather and the pain is relieved by rest. There has been no nocturnal or resting  symptoms. He has had dyspnea on exertion as well. He denies any  history of orthopnea, PND, edema, or syncope. A stress test was  performed, which showed ST-segment depression. A cardiac  catheterization done on 07/07/2011 showed a 90% left main stenosis ,with high-grade  stenosis of the right coronary, as well and moderate disease of the  circumflex. EF was normal and there was no evidence of mitral valve or  aortic valve disease. LVEDP was 12 mmHg. The patient was felt to be a  candidate for surgical coronary revascularization. A cardiothoracic consultation was obtained with Dr. Donata Clay for consideration of coronary bypass grafting surgery. Potential risks, complications, benefits of the surgery discussed with the patient and he agreed to proceed. He underwent CABG x4 on 07/10/2011.   Brief Hospital Course:  He was extubated successfully the afternoon of surgery. He remained afebrile and was hemodynamically  stable. His Swan-Ganz, A-line, Foley, chest tubes were all removed early in his postoperative course. He was started on low-dose Lopressor. He was found to be volume overloaded and diuresed accordingly. He was found have acute blood loss anemia. His H&H was low 8.5/25. He did not require postoperative transfusion. His last H&H was up to 9.3 and 26.4. He was also found to have thrombocytopenia. His platelets went as low as  120,000; however,this resolved and his last platelet count was up to 204,000. He was felt surgically stable for transfer from the intensive care unit to [facility]  for further convalescence on 07/12/2011. He did have some complaints of nausea but no emesis. He did have some abdominal distention.  KUB showed a probable ileus. He was kept on clear liquids for couple of days his diet was then advanced as tolerated. He did have several bowel movements. He then had a brief run of SVT with heart rates to 180. He returned to sinus rhythm and remained in sinus rhythm until his discharge. Epicardial pacing wires and chest tube sutures will be removed prior discharge. Provided he remains afebrile, hemodynamically stable, and pending morning round evaluation, he will be surgically stable for discharge on 07/15/2011.  Filed Vitals:   07/14/11 0543  BP: 130/78  Pulse: 179  Temp:   Resp: 18     Latest Vital Signs: Blood pressure 130/78, pulse 179, temperature 98.5 F (36.9 C), temperature source Oral, resp. rate 18, height 5\' 10"  (1.778 m), weight 213 lb (96.616 kg), SpO2 94.00%.  Physical Exam: Cardiovascular: RRR, no murmurs, gallops, or rubs.  Pulmonary: Diminished at bases; no rales, wheezes, or rhonchi.  Abdomen: Soft,non tender, tympanic, hyperactive bowel sounds present.  Extremities: Bilateral lower extremity edema.  Wounds: Clean and dry. No erythema or signs of infection.  Discharge Condition:Stable  Recent laboratory studies:  Lab Results  Component Value Date   WBC 18.3* 07/13/2011   HGB 9.3* 07/13/2011   HCT 26.7* 07/13/2011   MCV 88.4 07/13/2011   PLT 204 07/13/2011   Lab Results  Component Value Date   NA 132* 07/14/2011   K 3.7 07/14/2011   CL 93* 07/14/2011   CO2 29 07/14/2011   CREATININE 1.29 07/14/2011   GLUCOSE 128* 07/14/2011      Diagnostic Studies: Dg Chest 2 View  07/13/2011  *RADIOLOGY REPORT*  Clinical Data: History of coronary artery bypass grafting.  CHEST - 2 VIEW  Comparison: 07/12/2011  Findings: The right jugular central venous catheter and left chest tube have been removed.  No evidence for a pneumothorax.  Stable appearance of the heart and mediastinum.  Epicardial wires are still present.  There are persistent basilar densities most compatible with  atelectasis and probably pleural fluid.  No evidence for pulmonary edema.  IMPRESSION: Removal of support apparatuses as described.  Negative for a pneumothorax.  Bibasilar densities are suggestive for atelectasis and probably a small amount of pleural fluid.  Original Report Authenticated By: Richarda Overlie, M.D.   Discharge Orders    Future Appointments: Provider: Department: Dept Phone: Center:   08/09/2011 9:30 AM Kerin Perna, MD [facility] [phone] [facility]      Discharge Medications: Medication List  As of 07/14/2011 12:51 PM   STOP taking these medications         aspirin 81 MG tablet      nitroGLYCERIN 0.4 MG SL tablet      pentazocine-naloxone 50-0.5 MG per tablet      telmisartan-hydrochlorothiazide 80-12.5 MG per tablet         TAKE these medications         aspirin 325 MG EC tablet   Take 1 tablet (325 mg total) by mouth daily.      diazepam 5 MG tablet   Commonly known as: VALIUM   Take 5 mg by mouth 4 (four) times daily. For nerves      furosemide 40 MG tablet   Commonly known as: LASIX   Take 1 tablet (40 mg total) by mouth daily.      guaiFENesin 600 MG 12 hr tablet  Commonly known as: MUCINEX   Take 1 tablet (600 mg total) by mouth every 12 (twelve) hours as needed.      imipramine 25 MG tablet   Commonly known as: TOFRANIL   Take 75 mg by mouth at bedtime.      levothyroxine 150 MCG tablet   Commonly known as: SYNTHROID, LEVOTHROID   Take 150 mcg by mouth daily.      metoprolol tartrate 25 MG tablet   Commonly known as: LOPRESSOR   Take 25 mg by mouth 2 (two) times daily.      Oxycodone HCl 10 MG Tabs   Take 1 tablet (10 mg total) by mouth every 4 (four) hours as needed.      potassium chloride SA 20 MEQ tablet   Commonly known as: K-DUR,KLOR-CON   Take 1 tablet (20 mEq total) by mouth daily.      simvastatin 20 MG tablet   Commonly known as: ZOCOR   Take 1 tablet (20 mg total) by mouth daily at 6 PM.            Follow Up  Appointments: Follow-up Information    Follow up with VAN Dinah Beers, MD. (08/09/2011 at 9:30 AM; please obtain a chest x-ray at [facility] imaging at 8:30 AM in the same office complex)    Contact information:   [address] [place] [address] [phone]       Follow up with Verne Carrow, MD. (2 weeks-please call office to arrange)    Contact information:   [facility] [address] [place] [address] [phone]          Signed: Doree Fudge MPA-C 07/14/2011, 12:51 PM

## 2011-07-14 NOTE — Progress Notes (Signed)
CARDIAC REHAB PHASE I   PRE:  Rate/Rhythm: 93SR  BP:  Supine:   Sitting: 110/70  Standing:    SaO2: 97%RA  MODE:  Ambulation: 460 ft   POST:  Rate/Rhythem: 105  BP:  Supine:   Sitting: 140/80  Standing:    SaO2: 96-98%RA hall 1425-1450 Pt walked 460 ft on RA with rolling walker and asst x 1. Tolerated well on RA. Left off of oxygen and notified RN. To chair with call bell.  Duanne Limerick

## 2011-07-14 NOTE — Progress Notes (Signed)
Patient sleeping lying on back had a short run at.fib. Rate 160 then back down to s.r. 90 r.n. Aware cont to monitor patient and rhythm.Marland Kitchen

## 2011-07-14 NOTE — Progress Notes (Signed)
Patient Name: Dean Cole Date of Encounter: 07/14/2011     Principal Problem:  *Unstable angina Active Problems:  HTN (hypertension)  Hypothyroidism  Coronary artery disease  Borderline diabetes  Restless leg syndrome    SUBJECTIVE: Pt reports experiencing palpitations upon voiding this morning. He reports improved chest soreness at sternotomy site. Ambulating well. He denies shortness of breath, diaphoresis, lightheadedness, n/v or abdominal pain.    OBJECTIVE  Filed Vitals:   07/13/11 1402 07/13/11 1900 07/14/11 0528 07/14/11 0543  BP: 112/66 110/61 100/63 130/78  Pulse: 87 96 111 179  Temp: 98.3 F (36.8 C) 99.3 F (37.4 C) 98.5 F (36.9 C)   TempSrc: Oral Oral Oral   Resp: 18 18 18 18   Height:      Weight:   96.616 kg (213 lb)   SpO2: 90% 91% 94%     Intake/Output Summary (Last 24 hours) at 07/14/11 0815 Last data filed at 07/14/11 0545  Gross per 24 hour  Intake   1773 ml  Output    200 ml  Net   1573 ml   Weight change: -1.184 kg (-2 lb 9.8 oz)  PHYSICAL EXAM  General: Well developed, well nourished, in no acute distress. Head: Normocephalic, atraumatic, sclera non-icteric, no xanthomas  Neck: Supple without bruits or JVD. Lungs:  Resp regular and unlabored, CTAB without wheezes, rales or rhonchi Heart: RRR no s3, s4, or murmurs, heaves, rubs, gallops or thrills Abdomen: Distended, distant bowel sounds, non-tender  Msk:  Strength and tone appears normal for age. Extremities: Trace pedal edema noted. No clubbing or cyanosis. DP/PT/Radials 2+ and equal bilaterally. Neuro: Alert and oriented X 3. Moves all extremities spontaneously. Psych: Normal affect.  LABS:  Recent Labs  Basename 07/13/11 0650 07/12/11 0400   WBC 18.3* 13.3*   HGB 9.3* 8.4*   HCT 26.7* 23.7*   MCV 88.4 88.4   PLT 204 133*    Lab 07/14/11 0630 07/13/11 0650 07/12/11 0400  NA 132* 134* 131*  K 3.7 3.8 3.8  CL 93* 96 96  CO2 29 29 28   BUN 27* 27* 28*  CREATININE  1.29 1.36* 1.44*  CALCIUM 8.6 8.4 7.7*  PROT -- -- --  BILITOT -- -- --  ALKPHOS -- -- --  ALT -- -- --  AST -- -- --  AMYLASE -- -- --  LIPASE -- -- --  GLUCOSE 128* 131* 126*   TELE: 0540-0600 03/15: sinus tachycardia to PSVT at 110-180 bpm; spontaneously converted to NSR with frequent PACs, trace PVCs  ECG: no new tracings  Radiology/Studies:  Dg Chest 2 View  07/13/2011  *RADIOLOGY REPORT*  Clinical Data: History of coronary artery bypass grafting.  CHEST - 2 VIEW  Comparison: 07/12/2011  Findings: The right jugular central venous catheter and left chest tube have been removed.  No evidence for a pneumothorax.  Stable appearance of the heart and mediastinum.  Epicardial wires are still present.  There are persistent basilar densities most compatible with atelectasis and probably pleural fluid.  No evidence for pulmonary edema.  IMPRESSION: Removal of support apparatuses as described.  Negative for a pneumothorax.  Bibasilar densities are suggestive for atelectasis and probably a small amount of pleural fluid.  Original Report Authenticated By: Richarda Overlie, M.D.   Dg Chest 2 View  07/08/2011  *RADIOLOGY REPORT*  Clinical Data: Pre CABG evaluation.  Ex-smoker.  CHEST - 2 VIEW  Comparison: None.  Findings: Normal sized heart.  Linear density in both lower lung zones.  Minimal central peribronchial thickening.  Bilateral nipple studs.  Mild scoliosis.  IMPRESSION: Bibasilar linear scarring and minimal chronic bronchitic changes. No acute abnormality.  Original Report Authenticated By: Darrol Angel, M.D.   Dg Abd 1 View - Kub  07/13/2011  *RADIOLOGY REPORT*  Clinical Data: Diffuse abdominal pain, evaluate for ileus  ABDOMEN - 1 VIEW  Comparison: PA and lateral chest radiograph - 07/08/2011  Findings: There is marked dilatation of the colon, and particular, the likely high-riding cecum and ascending colon, measuring approximately 12 cm in greatest diameter.  There is nonspecific mild dilatation  of a solitary loop of small bowel within the left mid/lower hemiabdomen measuring 3.2 cm in diameter.  Gas is seen within the descending colon. Evaluation for pneumoperitoneum is limited secondary to exclusion of the lower thorax.  Epicardial pacer wires overlie the upper abdomen.  No acute osseous abnormalities.  IMPRESSION: Gaseous distension of predominantly the colon most suggestive of a developing ileus.  Continued attention on follow-up is recommended.  Original Report Authenticated By: Waynard Reeds, M.D.   Dg Chest Portable 1 View In Am  07/12/2011  *RADIOLOGY REPORT*  Clinical Data: Postop CABG.  PORTABLE CHEST - 1 VIEW 13/2013 0637 hours:  Comparison: Portable chest x-rays yesterday and 07/10/2011.  Findings: Interval Swan-Ganz catheter removal.  Right jugular introducer sheath tip remains in the upper SVC.  Left chest tube remains in place with no pneumothorax.  Cardiac silhouette enlarged but stable.  Bilateral pleural effusions, right greater than left, and atelectasis in the lung bases, unchanged.  No new pulmonary parenchymal abnormalities.  IMPRESSION: No pneumothorax.  Stable bilateral pleural effusions, right greater than left, and associated bibasilar atelectasis.  No new abnormalities.  Original Report Authenticated By: Arnell Sieving, M.D.   Dg Chest Portable 1 View In Am  07/11/2011  *RADIOLOGY REPORT*  Clinical Data: Postop for median sternotomy.  Hypertension. Coronary artery disease.  PORTABLE CHEST - 1 VIEW  Comparison: 07/10/2011  Findings: Extubation.  Removal of nasogastric tube.  Right-sided Swan-Ganz catheter with tip at proximal right pulmonary artery.  Prior median sternotomy.  A mediastinal drain and left chest tube remain in place.  Severe mediastinal soft tissue fullness persists.  This could be due to low lung volumes and AP portable technique.  Small left greater right pleural effusions are increased. No pneumothorax. Low lung volumes.  Persistent bibasilar airspace  disease.  Mild pulmonary venous congestion.  IMPRESSION:  1.  Removal of endotracheal and nasogastric tubes. 2.  Left chest tube remaining in place, without pneumothorax. 3.  Similar apparent soft tissue fullness of the superior mediastinum.  Possibly technique related. Recommend attention on follow-up. 4.  Persistent bibasilar atelectasis.  Original Report Authenticated By: Consuello Bossier, M.D.   Dg Chest Portable 1 View  07/10/2011  *RADIOLOGY REPORT*  Clinical Data: Postop from open heart surgery.  PORTABLE CHEST - 1 VIEW  Comparison: 07/08/2011  Findings: 1421 hours.  Endotracheal tube tip is 5.8 cm above the base of the carina.  NG tube tip overlies the mid stomach.  Left chest tube noted without evidence for pneumothorax.  Right IJ pulmonary artery catheter tip is in the right main pulmonary artery.  Cardiopericardial silhouette is enlarged and mediastinal contours are increased compared the preoperative film.  Attention on follow- up imaging recommended.  There is bibasilar atelectasis with vascular congestion.  Midline pericardial/mediastinal drain is poorly visualized.  IMPRESSION: Cardiomegaly with prominence of the mediastinum on this immediate postoperative study.  Attention on follow-up  films recommended.  Bilateral atelectasis with vascular congestion.  Original Report Authenticated By: ERIC A. MANSELL, M.D.    Current Medications:     . acetaminophen  1,000 mg Oral Q6H  . aspirin EC  325 mg Oral Daily  . bisacodyl  10 mg Oral Daily   Or  . bisacodyl  10 mg Rectal Daily  . diazepam  5 mg Oral QID  . docusate sodium  200 mg Oral Daily  . furosemide  40 mg Oral Daily  . levothyroxine  150 mcg Oral Q breakfast  . metoprolol tartrate  12.5 mg Oral BID  . pantoprazole  40 mg Oral Q1200  . polyethylene glycol  34 g Oral Daily  . potassium chloride  20 mEq Oral Daily  . simvastatin  20 mg Oral q1800  . sodium chloride  3 mL Intravenous Q12H    ASSESSMENT AND PLAN:  1. PSVT-  0540-0600 today with rate as high as 179 bpm, spontaneous conversion to NSR noted on telemetry. Patient endorsed palpitations. He denies history of prior arrhythmias, but does note occasional palpitations prior to this admission. This is likely in the setting of a developing ileus noted on KUB, resultant leukocytosis noted on CBC yesterday (u/a clean, CXR without evidence of PNA) and his post-op status. Patient also has anxiety and has been taking Valium at an increased frequency outpatient for >20 years. Spoke with [facility] PA.  - Agree with Lopressor up-titration to 25mg  BID, also consistent with listed home regimen  - Will continue to monitor  - Agree with re-initiation of home Valium dosing to avoid withdrawal symptoms  - Ileus monitoring/management per primary team  2. CAD/unstable angina- POD 4 s/p CABG x 4; ambulating well, notes improved sternotomy site tenderness. Mediastinal/pleural drains removed. Incision sites healing well. Euvolemic on exam, no rales, rhonchi, wheezes, edema or JVD appreciated. Cr from 1.36 --> 1.29 this AM.   - Continue ASA/BB/statin  - Continue Lasix 40mg  PO daily with KCl supplementation  3. HTN- very well-controlled  - Continue antihypertensives  4. Hyperlipidemia  - Continue statin  5. Hypothyroidism  - Continue Synthroid  Signed, R. Hurman Horn, PA-C 07/14/2011, 8:15 AM  Stable and in good spirits.  Bowels now moving  No recurrent arrthymia during the day.  Agree with increase of metoprolol to 25mg  twice daily, his home dose.  I agree with problem list, recommendations, and plans.    07/14/2011 Shawnie Pons 3:17 PM

## 2011-07-14 NOTE — Discharge Instructions (Signed)
Endoscopic Saphenous Vein Harvesting A procedure called saphenous vein harvesting is done as part of some heart surgeries. Blood vessels that carry blood to the heart can become blocked. When that happens, a surgeon can create a detour (bypass) around the blocked area. To do this, the surgeon uses a healthy blood vessel from someplace else in your body. The vein that runs along the inside of your leg (greater saphenous vein) is often used. It goes from your ankle to your groin. Saphenous vein harvesting is the procedure that is done to take part of this vein from your leg. For an endoscopic procedure, only very small cuts (incisions) are needed. The surgeon uses a tiny tool (endoscope) to find and take out part of the vein.  LET YOUR CAREGIVER KNOW ABOUT:  Allergies to food or medicine.   Medicines taken, including vitamins, herbs, eyedrops, over-the-counter medicines, and creams.   Use of steroids (by mouth or creams).   Previous problems with anesthetics or numbing medicines.   History of bleeding problems or blood clots.   Previous surgery.   Problems with your leg veins, such as painful or enlarged (varicose) veins.   Other health problems, including diabetes, history of wound problems, and kidney problems.   Possibility of pregnancy, if this applies.  RISKS AND COMPLICATIONS Saphenous vein harvesting is done as a part of heart surgery.The heart surgery may have risks. However, there are usually few problems with the vein harvesting part of surgery. This is especially true with an endoscopic procedure. However, problems can occur, such as:  Bleeding in the leg.   Bleeding under the skin.   Infection.   Leg pain.   Leg swelling.   Nerve damage.  Some people are more likely than others to have problems with saphenous vein harvesting. They include:  Women.   People with diabetes.   People who are overweight.   People who smoke.   People who have had a blood vessel  disease in their legs.   People who have varicose leg veins.  BEFORE THE PROCEDURE  A medical evaluation will be done. This may include an ultrasound exam to make sure your saphenous vein is healthy.   If you smoke, quit a few weeks before your surgery.   A week before the procedure, stop taking drugs that can cause bleeding during and after your surgery. This includes aspirin, nonsteroidal anti-inflammatory drugs, ibuprofen, and naproxen. Also stop taking vitamin E.   If you take blood thinners, ask your surgeon when you should stop taking them before surgery.   The day before the surgery, eat only a light dinner. Do not eat or drink anything after midnight. Ask your caregiver if it is okay to take any needed medicines with a sip of water.   Arrive at least 1 hour before the surgery, or as directed by your surgeon.  PROCEDURE Saphenous vein harvesting will be done at the very beginning of your heart surgery. It is usually done while your heart surgery is being started.  Small monitors will be put on your body. They are used to check your heart, blood pressure, and oxygen level.   You will be given an intravenous line (IV). A needle will be put in your arm or hand. It is hooked to a plastic tube. Medicine will flow directly into your body through the IV.   You will be given medicine that makes you sleep (general anesthetic).   A tube will be put in your throat to help   you breathe during the surgery. It will also be used to give you anesthetic medicine during the surgery.   A soft tube (catheter) may be put in your bladder to drain urine during and after the surgery.   Your leg will be cleaned with a germ-killing (antiseptic) solution.   When you are asleep, 1 to 3 incisions will be made on the inside of your leg. The endoscope will be put into these incisions. The endoscope is a long, thin tube with a tiny camera on the end. The surgeon will use this tool to find a segment of vein that  will work for the bypass. This segment is cut out.   Clips, ties, or electric current (cautery) may be used to seal off the vein. These methods stop the bleeding. Other veins will take over for the one that is removed to keep your leg healthy.   The surgeon will close the incisions. Clips or small stitches (sutures) may be used.   A drain may be placed under the skin of your leg. It looks like a long, thin tube, and it allows fluids to drain out of your leg after the surgery.   Your leg will probably be wrapped in an elastic bandage or stocking.   The vein that was harvested will be used in your heart surgery.  AFTER THE PROCEDURE  When your heart surgery is over, you will probably be taken directly to the intensive care unit (ICU). You will continue to use a breathing machine (ventilator) and get fluids through the IV for a while.   When no more fluid drains from your leg, the drain in your leg will be taken out.   You will be able to go home once you are recovered from your heart surgery. Most people stay in the hospital for at least 4 days. You may spend 1 to several days in an intensive care area. Then you may spend several days in a regular hospital room.  Document Released: 12/28/2010 Document Revised: 04/06/2011 Document Reviewed: 12/28/2010 [employer] Patient Information 2012 [employer].Coronary Artery Bypass Grafting Care After Refer to this sheet in the next few weeks. These instructions provide you with information on caring for yourself after your procedure. Your caregiver may also give you more specific instructions. Your treatment has been planned according to current medical practices, but problems sometimes occur. Call your caregiver if you have any problems or questions after your procedure.  Recovery from open heart surgery will be different for everyone. Some people feel well after 3 or 4 weeks, while for others it takes longer. After heart surgery, it may be normal  to:  Not have an appetite, feel nauseated by the smell of food, or only want to eat a small amount.   Be constipated because of changes in your diet, activity, and medicines. Eat foods high in fiber. Add fresh fruits and vegetables to your diet. Stool softeners may be helpful.   Feel sad or unhappy. You may be frustrated or cranky. You may have good days and bad days. Do not give up. Talk to your caregiver if you do not feel better.   Feel weakness and fatigue. You many need physical therapy or cardiac rehabilitation to get your strength back.   Develop an irregular heartbeat called atrial fibrillation. Symptoms of atrial fibrillation are a fast, irregular heartbeat or feelings of fluttery heartbeats, shortness of breath, low blood pressure, and dizziness. If these symptoms develop, see your caregiver right away.  MEDICATION    Have a list of all the medicines you will be taking when you leave the hospital. For every medicine, know the following:   Name.   Exact dose.   Time of day to be taken.   How often it should be taken.   Why you are taking it.   Ask which medicines should or should not be taken together. If you take more than one heart medicine, ask if it is okay to take them together. Some heart medicines should not be taken at the same time because they may lower your blood pressure too much.   Narcotic pain medicine can cause constipation. Eat fresh fruits and vegetables. Add fiber to your diet. Stool softener medicine may help relieve constipation.   Keep a copy of your medicines with you at all times.   Do not add or stop taking any medicine until you check with your caregiver.   Medicines can have side effects. Call your caregiver who prescribed the medicine if you:   Start throwing up, have diarrhea, or have stomach pain.   Feel dizzy or lightheaded when you stand up.   Feel your heart is skipping beats or is beating too fast or too slow.   Develop a rash.    Notice unusual bruising or bleeding.  HOME CARE INSTRUCTIONS  After heart surgery, it is important to learn how to take your pulse. Have your caregiver show you how to take your pulse.   Use your incentive spirometer. Ask your caregiver how long after surgery you need to use it.  Care of your chest incision  Tell your caregiver right away if you notice clicking in your chest (sternum).   Support your chest with a pillow or your arms when you take deep breaths and cough.   Follow your caregiver's instructions about when you can bathe or swim.   Protect your incision from sunlight during the first year to keep the scar from getting dark.   Tell your caregiver if you notice:   Increased tenderness of your incision.   Increased redness or swelling around your incision.   Drainage or pus from your incision.  Care of your leg incision(s)  Avoid crossing your legs.   Avoid sitting for long periods of time. Change positions every half hour.   Elevate your leg(s) when you are sitting.   Check your leg(s) daily for swelling. Check the incisions for redness or drainage.   Wear your elastic stockings as told by your caregiver. Take them off at bedtime.  Diet  Diet is very important to heart health.   Eat plenty of fresh fruits and vegetables. Meats should be lean cut. Avoid canned, processed, and fried foods.   Talk to a dietician. They can teach you how to make healthy food and drink choices.  Weight  Weigh yourself every day. This is important because it helps to know if you are retaining fluid that may make your heart and lungs work harder.   Use the same scale each time.   Weigh yourself every morning at the same time. You should do this after you go to the bathroom, but before you eat breakfast.   Your weight will be more accurate if you do not wear any clothes.   Record your weight.   Tell your caregiver if you have gained 2 pounds or more overnight.   Activity Stop any activity at once if you have chest pain, shortness of breath, irregular heartbeats, or dizziness. Get help right   away if you have any of these symptoms.  Bathing.  Avoid soaking in a bath or hot tub until your incisions are healed.   Rest. You need a balance of rest and activity.   Exercise. Exercise per your caregiver's advice. You may need physical therapy or cardiac rehabilitation to help strengthen your muscles and build your endurance.   Climbing stairs. Unless your caregiver tells you not to climb stairs, go up stairs slowly and rest if you tire. Do not pull yourself up by the handrail.   Driving a car. Follow your caregiver's advice on when you may drive. You may ride as a passenger at any time. When traveling for long periods of time in a car, get out of the car and walk around for a few minutes every 2 hours.   Lifting. Avoid lifting, pushing, or pulling anything heavier than 10 pounds for 6 weeks after surgery or as told by your caregiver.   Returning to work. Check with your caregiver. People heal at different rates. Most people will be able to go back to work 6 to 12 weeks after surgery.   Sexual activity. You may resume sexual relations as told by your caregiver.  SEEK MEDICAL CARE IF:  Any of your incisions are red, painful, or have any type of drainage coming from them.   You have an oral temperature above 102 F (38.9 C).   You have ankle or leg swelling.   You have pain in your legs.   You have weight gain of 2 or more pounds a day.   You feel dizzy or lightheaded when you stand up.  SEEK IMMEDIATE MEDICAL CARE IF:  You have angina or chest pain that goes to your jaw or arms. Call your local emergency services right away.   You have shortness of breath at rest or with activity.   You have a fast or irregular heartbeat (arrhythmia).   There is a "clicking" in your sternum when you move.   You have numbness or weakness in your arms or legs.   MAKE SURE YOU:  Understand these instructions.   Will watch your condition.   Will get help right away if you are not doing well or get worse.  Document Released: 11/04/2004 Document Revised: 04/06/2011 Document Reviewed: 06/22/2010 [employer] Patient Information 2012 [employer]. 

## 2011-07-14 NOTE — Progress Notes (Signed)
PATIENT AMB. IN HALL WITH WIFE AND O2 150 FEET TOL WELL.

## 2011-07-14 NOTE — Progress Notes (Signed)
PATIENT IN BATHROOM TRYING TO VOID . H.R. UP 140-190 . PATIENT FELT SOME PALPATIONS . ASSIST.BACK TO BED SKIN WARM. DRY HS-RAPID NOT IRREG.  V.S. 130/78 T 98.5 02 2L/M N.C. 93% R.20 R. N. AWARE EKG DONE SHOWING S.T. W/ SHORT PR INTERVALS. RAPID RSP. KIM R.N. CALLED TO COME LOOK AT EKG. H.R. SLOWING COMING DOWN TO S.R. 90'S. PATIENT WIFE STATES PT. TAKES VALIUM 5 MG PO 4 X DAY AT HOME TO HELP WITH HIS H.R. AND ANX. VALIUM 5 MG PO GIVEN DR. Tyrone Sage PAGED AT 0600 AND 06:15 AND 06:20 CALL RETURNED AND HE IS AWARE OF PATIENT RHYTHM AND HX. NO NEW ORDERS GIVEN. WIFE TO DISCUSS MEDS WITH DR VANTRIGT TODAY.

## 2011-07-15 NOTE — Progress Notes (Signed)
5 Days Post-Op Procedure(s) (LRB): CORONARY ARTERY BYPASS GRAFTING (CABG) (N/A)  Subjective: Patient with bowel movement. Less anxious as was give his valium as taken prior to surgery.  Objective: Vital signs in last 24 hours: Patient Vitals for the past 24 hrs:  BP Temp Temp src Pulse Resp SpO2 Weight  07/15/11 0445 121/82 mmHg 97.7 F (36.5 C) Oral 87  18  98 % 213 lb 1.6 oz (96.662 kg)  07/14/11 1633 - 99.2 F (37.3 C) - - - - -  07/14/11 1339 126/63 mmHg 100.2 F (37.9 C) - 91  18  95 % -   Pre op weight  90 kg Current Weight  07/15/11 213 lb 1.6 oz (96.662 kg)      Intake/Output from previous day: 03/15 0701 - 03/16 0700 In: 720 [P.O.:720] Out: 501 [Urine:500; Stool:1]   Physical Exam:  Cardiovascular: RRR, no murmurs, gallops, or rubs. Pulmonary: Slightly diminished at bases; no rales, wheezes, or rhonchi. Abdomen: Soft,non tender, tympanic, hyperactive bowel sounds present. Extremities: Mild bilateral lower extremity edema. Wounds: Clean and dry.  No erythema or signs of infection.  Lab Results: CBC:  Basename 07/13/11 0650  WBC 18.3*  HGB 9.3*  HCT 26.7*  PLT 204   BMET:   Basename 07/14/11 0630 07/13/11 0650  NA 132* 134*  K 3.7 3.8  CL 93* 96  CO2 29 29  GLUCOSE 128* 131*  BUN 27* 27*  CREATININE 1.29 1.36*  CALCIUM 8.6 8.4    PT/INR: No results found for this basename: LABPROT,INR in the last 72 hours ABG:  INR: Will add last result for INR, ABG once components are confirmed Will add last 4 CBG results once components are confirmed  Assessment/Plan:  1. CV - HR increased to 170's earlier and appeared to be SVT on 07/14/2011.SR since then.Continue Lopressor  25 bid. 2.  Pulmonary - Encourage incentive spirometer. 3. Volume Overload - Continue with diuresis. 4.  Acute blood loss anemia -Last  H/H  to 9.3/26.7. 5.Thrombocytopenia resolved as platelets increased to 204,000. 6.Leukocytosis-WBC increased from 13.3 to 18.3. Remains afebrile.UA  is negative. 7.GI-Probable post op ileus, now resolved. 9.Mild ARI now resolved.Creatnine down to 1.29. 10.Remove EPW and CT sutures. 11.DIscharge later today.   Rmoni Keplinger MPA-C 07/15/2011    07/15/2011 9:03 AM

## 2011-07-15 NOTE — Progress Notes (Signed)
DC'd pt home with girlfriend. RN reviewed dc instructions and med rec with patient and girlfriend.  RN removed IV with catheter intact.  Pt and g/f verbalized understanding of follow up appointments with MD.

## 2011-07-15 NOTE — Progress Notes (Signed)
Dc'd EPW's and CT sutures per hospital policy.  Wires intact and pt tolerated well.  Instructed patient to stay in bed for 1 hour or until 1200. Will continue to monitor patient.

## 2011-07-15 NOTE — Progress Notes (Signed)
CARDIAC REHAB PHASE I   PRE:  Rate/Rhythm:94 sinus rhythm   BP:  Supine:   Sitting: 120/64  Standing:    SaO2: 97% RA   MODE:  Ambulation: 550 ft   POST:  Rate/Rhythem: 109 sinus tach  BP:  Supine:   Sitting: 140/60  Standing:    SaO2: 99% RA  1000-1030  Pt ambulated in hallway without difficulty, denies cp, dyspnea or dizziness.  Pt c/o incisional discomfort.  RN aware.   Pt education completed.  Pt has watched CABG video.  Pt oriented to CRPII, at pt request referral will be sent to [facility].  Understanding verbalized  Dan Europe

## 2011-07-15 NOTE — Progress Notes (Signed)
PATIENT AMB. IN HALL WITH WIFE 150 FT TOL WELL.

## 2011-07-24 NOTE — Discharge Summary (Signed)
patient examined and medical record reviewed,agree with above note. VAN TRIGT III,Reita Shindler 07/24/2011    

## 2011-07-26 ENCOUNTER — Ambulatory Visit (INDEPENDENT_AMBULATORY_CARE_PROVIDER_SITE_OTHER): Payer: Medicare Other | Admitting: Cardiology

## 2011-07-26 ENCOUNTER — Encounter: Payer: Self-pay | Admitting: Cardiology

## 2011-07-26 VITALS — BP 110/70 | HR 94 | Ht 70.0 in | Wt 186.0 lb

## 2011-07-26 DIAGNOSIS — I2510 Atherosclerotic heart disease of native coronary artery without angina pectoris: Secondary | ICD-10-CM

## 2011-07-26 DIAGNOSIS — Z951 Presence of aortocoronary bypass graft: Secondary | ICD-10-CM

## 2011-07-26 DIAGNOSIS — E039 Hypothyroidism, unspecified: Secondary | ICD-10-CM

## 2011-07-26 DIAGNOSIS — R7309 Other abnormal glucose: Secondary | ICD-10-CM

## 2011-07-26 DIAGNOSIS — R7303 Prediabetes: Secondary | ICD-10-CM

## 2011-07-26 MED ORDER — SIMVASTATIN 20 MG PO TABS: 20.0000 mg | ORAL_TABLET | Freq: Every day | ORAL | Status: DC

## 2011-07-26 NOTE — Assessment & Plan Note (Signed)
The patient is clinically euthyroid on Synthroid

## 2011-07-26 NOTE — Assessment & Plan Note (Signed)
The patient has a history of hypercholesterolemia and borderline diabetes mellitus and is followed for these by his medical doctor Dr. Chilton Si.

## 2011-07-26 NOTE — Patient Instructions (Signed)
Your physician recommends that you continue on your current medications as directed. Please refer to the Current Medication list given to you today.  Your physician recommends that you schedule a follow-up appointment in: 3 months with Dr. Brackbill.   

## 2011-07-26 NOTE — Assessment & Plan Note (Signed)
The patient has been feeling well since surgery.  He has had only minimal post operative chest wall pain.  He has not been aware of any arrhythmias.  Appetite has been good.

## 2011-07-26 NOTE — Progress Notes (Signed)
Dean Cole Date of Birth:  1951/01/22 [facility] [address] [place], [place]  [address] [phone]         Fax   [phone]  History of Present Illness: This pleasant 61 year old gentleman is seen for the first time by me today.  His wife is my patient.  Dean Cole was recently discharged from [facility] after undergoing coronary artery bypass grafting x4 on 07/10/11 by Dr. Donata Clay for severe multivessel coronary artery disease.  The patient also has a history of high blood pressure, prediabetes, hypothyroidism, and has a history of mild renal insufficiency which had resolved prior to discharge.  He also had acute blood loss anemia and he had thrombocytopenia which resolved prior to discharge.  The patient quit smoking 20 years ago.  He does have a history of elevated cholesterol and is on simvastatin.  Current Outpatient Prescriptions  Medication Sig Dispense Refill  . aspirin EC 325 MG EC tablet Take 1 tablet (325 mg total) by mouth daily.  30 tablet    . diazepam (VALIUM) 5 MG tablet Take 5 mg by mouth 4 (four) times daily. For nerves      . guaiFENesin (MUCINEX) 600 MG 12 hr tablet Take 1 tablet (600 mg total) by mouth every 12 (twelve) hours as needed.  30 tablet  0  . imipramine (TOFRANIL) 25 MG tablet Take 75 mg by mouth at bedtime.      Marland Kitchen levothyroxine (SYNTHROID, LEVOTHROID) 150 MCG tablet Take 150 mcg by mouth daily.      . metoprolol tartrate (LOPRESSOR) 25 MG tablet Take 25 mg by mouth 2 (two) times daily.      . polyethylene glycol (MIRALAX / GLYCOLAX) packet Take 17 g by mouth daily. As needed      . simvastatin (ZOCOR) 20 MG tablet Take 1 tablet (20 mg total) by mouth daily at 6 PM.  30 tablet  5    No Known Allergies  Patient Active Problem List  Diagnoses  . HTN (hypertension)  . Hypothyroidism  . Coronary artery disease  . Borderline diabetes  . Cancer  . Headache  . Unstable angina  . Restless leg syndrome  . PSVT  (paroxysmal supraventricular tachycardia)    History  Smoking status  . Former Smoker  Smokeless tobacco  . Former Neurosurgeon    History  Alcohol Use  . Yes    "mixed drink every now and then"    Family History  Problem Relation Age of Onset  . Coronary artery disease Father   . Coronary artery disease Paternal Grandfather     Review of Systems: Constitutional: no fever chills diaphoresis or fatigue or change in weight.  Head and neck: no hearing loss, no epistaxis, no photophobia or visual disturbance. Respiratory: No cough, shortness of breath or wheezing. Cardiovascular: No chest pain peripheral edema, palpitations. Gastrointestinal: No abdominal distention, no abdominal pain, no change in bowel habits hematochezia or melena. Genitourinary: No dysuria, no frequency, no urgency, no nocturia. Musculoskeletal:No arthralgias, no back pain, no gait disturbance or myalgias. Neurological: No dizziness, no headaches, no numbness, no seizures, no syncope, no weakness, no tremors. Hematologic: No lymphadenopathy, no easy bruising. Psychiatric: No confusion, no hallucinations, no sleep disturbance.    Physical Exam: Filed Vitals:   07/26/11 1502  BP: 110/70  Pulse: 94   the general appearance reveals a well-developed well-nourished gentleman in no distress.Pupils equal and reactive.   Extraocular Movements are full.  There is no scleral  icterus.  The mouth and pharynx are normal.  The neck is supple.  The carotids reveal no bruits.  The jugular venous pressure is normal.  The thyroid is not enlarged.  There is no lymphadenopathy.  The chest is clear to percussion and auscultation. There are no rales or rhonchi. Expansion of the chest is symmetrical.  Incision is well-healedThe abdomen is soft and nontender. Bowel sounds are normal. The liver and spleen are not enlarged. There Are no abdominal masses. There are no bruits.  The precordium is quiet.  The first heart sound is normal.   The second heart sound is physiologically split.  There is no murmur gallop rub or click.  There is no abnormal lift or heave.  The pedal pulses are good.  There is no phlebitis or edema.  There is no cyanosis or clubbing. Neurologic is unremarkable.  EKG shows normal sinus rhythm and nonspecific T-wave changes   Assessment / Plan: The patient is doing well following recent coronary artery bypass graft surgery.  He has not had any fluid retention since stopping the Lasix and potassium last week.  He is requesting to be able to use salt substitute and this will be okay.  The patient is awaiting word from the cardiac rehabilitation program to start rehabilitation after he sees his surgeon next week.  He plans to go to either [place] or [place] for the cardiac rehabilitation program.  He anticipates starting to drive his car after he sees Careers adviser next week. The patient will continue present cardiac medication and be rechecked here in 3 months for followup office visit and EKG.  He will see Dr. Chilton Si in 2 months for followup on his cholesterol and diabetes.

## 2011-08-03 ENCOUNTER — Other Ambulatory Visit: Payer: Self-pay | Admitting: Cardiothoracic Surgery

## 2011-08-03 DIAGNOSIS — I2510 Atherosclerotic heart disease of native coronary artery without angina pectoris: Secondary | ICD-10-CM

## 2011-08-09 ENCOUNTER — Ambulatory Visit: Payer: Medicare Other | Admitting: Cardiothoracic Surgery

## 2011-08-09 ENCOUNTER — Ambulatory Visit
Admission: RE | Admit: 2011-08-09 | Discharge: 2011-08-09 | Disposition: A | Discharge status: Home / Self Care | Payer: Medicare Other | Source: Ambulatory Visit | Attending: Cardiothoracic Surgery | Admitting: Cardiothoracic Surgery

## 2011-08-09 ENCOUNTER — Encounter: Payer: Self-pay | Admitting: Cardiothoracic Surgery

## 2011-08-09 ENCOUNTER — Ambulatory Visit (INDEPENDENT_AMBULATORY_CARE_PROVIDER_SITE_OTHER): Payer: Self-pay | Admitting: Cardiothoracic Surgery

## 2011-08-09 VITALS — BP 116/81 | HR 92 | Resp 16 | Ht 70.0 in | Wt 187.0 lb

## 2011-08-09 DIAGNOSIS — Z09 Encounter for follow-up examination after completed treatment for conditions other than malignant neoplasm: Secondary | ICD-10-CM

## 2011-08-09 DIAGNOSIS — I2510 Atherosclerotic heart disease of native coronary artery without angina pectoris: Secondary | ICD-10-CM

## 2011-08-09 NOTE — Progress Notes (Signed)
PCP is GREEN, Lenon Curt, MD, MD Referring Provider is Verne Carrow D*                       [address] [facility].[address]            [place] [address]          [phone]       Chief Complaint  Patient presents with  . Routine Post Op    3 wk s/p CABG with cxr...has seen Dr. Patty Sermons    HPI: The patient is a 61 year old Caucasian male nondiabetic returns for his first postoperative visit after undergoing 4 vessel coronary artery bypass grafting one month ago. He had four-vessel bypass grafts and presented with unstable angina The patient is done well at home without recurrent angina and is walking 20 minutes daily. The surgical incisions are healing well. He has lost 10-12 pounds and has no signs or symptoms of CHF.  Past Medical History  Diagnosis Date  . HTN (hypertension)   . Hypothyroidism   . Borderline diabetes   . Coronary artery disease   . Angina   . Shortness of breath   . Cancer     skin cancer  basel cell carcinoma  . Headache   . Restless leg syndrome     Past Surgical History  Procedure Date  . Cardiac catheterization 07/07/2011  . Coronary artery bypass graft 07/10/2011    Procedure: CORONARY ARTERY BYPASS GRAFTING (CABG);  Surgeon: Kerin Perna, MD;  Location: [facility] OR;  Service: Open Heart Surgery;  Laterality: N/A;    Family History  Problem Relation Age of Onset  . Coronary artery disease Father   . Coronary artery disease Paternal Grandfather     Social History History  Substance Use Topics  . Smoking status: Former Smoker    Quit date: 08/08/1981  . Smokeless tobacco: Former Neurosurgeon  . Alcohol Use: No     "mixed drink every now and then"    Current Outpatient Prescriptions  Medication Sig Dispense Refill  . aspirin EC 325 MG EC tablet Take 1 tablet (325 mg total) by mouth daily.  30 tablet    . diazepam (VALIUM) 5 MG tablet Take 5 mg by mouth 4 (four) times daily. For nerves      . imipramine (TOFRANIL) 25 MG tablet Take 75 mg by  mouth at bedtime.      Marland Kitchen levothyroxine (SYNTHROID, LEVOTHROID) 150 MCG tablet Take 150 mcg by mouth daily.      . metoprolol tartrate (LOPRESSOR) 25 MG tablet Take 25 mg by mouth 2 (two) times daily.      . polyethylene glycol (MIRALAX / GLYCOLAX) packet Take 17 g by mouth daily. As needed      . simvastatin (ZOCOR) 20 MG tablet Take 1 tablet (20 mg total) by mouth daily at 6 PM.  30 tablet  5    No Known Allergies  Review of Systems appetite, sleeping habits, general energy all improving. No fever.  BP 116/81  Pulse 92  Resp 16  Ht 5\' 10"  (1.778 m)  Wt 187 lb (84.823 kg)  BMI 26.83 kg/m2  SpO2 96% Physical Exam Gen. alert oriented and comfortable Lungs clear Sternal incision well-healed Cardiac rhythm regular without murmur or gallop abdomen soft Extremities without edema vein harvest site healing adequately   Diagnostic Tests: Chest x-ray clear sternal wires intact no pleural effusion  Impression: Stable course one month after multivessel bypass grafting for  unstable angina.  Plan:The patient did resume driving ready to start outpatient rehabilitation at [facility]. He knows he should not lift more than 20 pounds for 3 months after surgery. He'll continue current medications.

## 2011-08-10 ENCOUNTER — Telehealth: Payer: Self-pay | Admitting: Cardiology

## 2011-08-10 NOTE — Telephone Encounter (Signed)
Spoke with Dr Zenaida Niece Tright's office and they will take care of getting cardiac rehab for patient

## 2011-08-10 NOTE — Telephone Encounter (Signed)
Advised wife  

## 2011-08-10 NOTE — Telephone Encounter (Signed)
New Problem:     Patient's wife called in wondering how or when her husband was supposed to begin his cardiac rehab.  Please call back.

## 2011-08-28 ENCOUNTER — Telehealth: Payer: Self-pay | Admitting: Cardiology

## 2011-08-28 NOTE — Telephone Encounter (Signed)
Please return call to patient wife Phyliss [phone]  Patient assigned to cardiac rehab, but unable to pay the $40 per visit co-pay.  Please return call to advise of possible alternate options.  She can be reached at [phone].

## 2011-08-28 NOTE — Telephone Encounter (Signed)
Advised wife and they will try to go some and then to the [facility]

## 2011-08-28 NOTE — Telephone Encounter (Signed)
The patient will just need to exercise on his own.

## 2011-08-28 NOTE — Telephone Encounter (Signed)
Will forward to  Dr. Brackbill for review 

## 2011-09-11 ENCOUNTER — Inpatient Hospital Stay (HOSPITAL_COMMUNITY)
Admission: EM | Admit: 2011-09-11 | Discharge: 2011-09-17 | DRG: 419 | Disposition: A | Discharge status: Home / Self Care | Payer: Medicare Other | Attending: General Surgery | Admitting: General Surgery

## 2011-09-11 ENCOUNTER — Encounter (HOSPITAL_COMMUNITY): Payer: Self-pay

## 2011-09-11 ENCOUNTER — Emergency Department (HOSPITAL_COMMUNITY): Payer: Medicare Other

## 2011-09-11 DIAGNOSIS — Z01810 Encounter for preprocedural cardiovascular examination: Secondary | ICD-10-CM

## 2011-09-11 DIAGNOSIS — E119 Type 2 diabetes mellitus without complications: Secondary | ICD-10-CM | POA: Diagnosis present

## 2011-09-11 DIAGNOSIS — K8000 Calculus of gallbladder with acute cholecystitis without obstruction: Secondary | ICD-10-CM

## 2011-09-11 DIAGNOSIS — I10 Essential (primary) hypertension: Secondary | ICD-10-CM | POA: Diagnosis present

## 2011-09-11 DIAGNOSIS — K810 Acute cholecystitis: Secondary | ICD-10-CM

## 2011-09-11 DIAGNOSIS — Z951 Presence of aortocoronary bypass graft: Secondary | ICD-10-CM

## 2011-09-11 DIAGNOSIS — Z87891 Personal history of nicotine dependence: Secondary | ICD-10-CM

## 2011-09-11 DIAGNOSIS — G2581 Restless legs syndrome: Secondary | ICD-10-CM | POA: Diagnosis present

## 2011-09-11 DIAGNOSIS — E039 Hypothyroidism, unspecified: Secondary | ICD-10-CM | POA: Diagnosis present

## 2011-09-11 DIAGNOSIS — I2510 Atherosclerotic heart disease of native coronary artery without angina pectoris: Secondary | ICD-10-CM

## 2011-09-11 HISTORY — DX: Anxiety disorder, unspecified: F41.9

## 2011-09-11 HISTORY — DX: Cholecystitis, unspecified: K81.9

## 2011-09-11 LAB — DIFFERENTIAL
Eosinophils Absolute: 0 10*3/uL (ref 0.0–0.7)
Lymphs Abs: 1 10*3/uL (ref 0.7–4.0)
Monocytes Relative: 8 % (ref 3–12)
Neutrophils Relative %: 85 % — ABNORMAL HIGH (ref 43–77)

## 2011-09-11 LAB — LIPASE, BLOOD: Lipase: 19 U/L (ref 11–59)

## 2011-09-11 LAB — COMPREHENSIVE METABOLIC PANEL
Alkaline Phosphatase: 143 U/L — ABNORMAL HIGH (ref 39–117)
BUN: 14 mg/dL (ref 6–23)
CO2: 26 mEq/L (ref 19–32)
GFR calc Af Amer: >90 mL/min (ref >90)
GFR calc non Af Amer: 89 mL/min — ABNORMAL LOW (ref >90)
Glucose, Bld: 162 mg/dL — ABNORMAL HIGH (ref 70–99)
Potassium: 3.5 mEq/L (ref 3.5–5.1)
Total Bilirubin: 0.6 mg/dL (ref 0.3–1.2)
Total Protein: 8.1 g/dL (ref 6.0–8.3)

## 2011-09-11 LAB — URINALYSIS, ROUTINE W REFLEX MICROSCOPIC
Glucose, UA: 100 mg/dL — AB
Hgb urine dipstick: NEGATIVE
Specific Gravity, Urine: 1.028 (ref 1.005–1.030)
pH: 6 (ref 5.0–8.0)

## 2011-09-11 LAB — CARDIAC PANEL(CRET KIN+CKTOT+MB+TROPI): Troponin I: <0.3 ng/mL (ref <0.30)

## 2011-09-11 LAB — URINE MICROSCOPIC-ADD ON

## 2011-09-11 LAB — CBC
Hemoglobin: 14 g/dL (ref 13.0–17.0)
MCH: 28.1 pg (ref 26.0–34.0)
MCV: 84.2 fL (ref 78.0–100.0)
RBC: 4.99 MIL/uL (ref 4.22–5.81)

## 2011-09-11 MED ORDER — DIPHENHYDRAMINE HCL 12.5 MG/5ML PO ELIX
12.5000 mg | ORAL_SOLUTION | Freq: Four times a day (QID) | ORAL | Status: DC | PRN
  Filled 2011-09-11: qty 10

## 2011-09-11 MED ORDER — ONDANSETRON HCL 4 MG/2ML IJ SOLN: 4.0000 mg | Freq: Four times a day (QID) | INTRAMUSCULAR | Status: DC | PRN

## 2011-09-11 MED ORDER — METOPROLOL TARTRATE 25 MG PO TABS
25.0000 mg | ORAL_TABLET | Freq: Two times a day (BID) | ORAL | Status: DC
  Administered 2011-09-11: 25 mg via ORAL
  Filled 2011-09-11 (×3): qty 1

## 2011-09-11 MED ORDER — SODIUM CHLORIDE 0.9 % IV BOLUS (SEPSIS)
500.0000 mL | Freq: Once | INTRAVENOUS | Status: AC
  Administered 2011-09-11: 500 mL via INTRAVENOUS

## 2011-09-11 MED ORDER — ACETAMINOPHEN 650 MG RE SUPP: 650.0000 mg | Freq: Four times a day (QID) | RECTAL | Status: DC | PRN

## 2011-09-11 MED ORDER — MORPHINE SULFATE 4 MG/ML IJ SOLN
4.0000 mg | Freq: Once | INTRAMUSCULAR | Status: AC
  Administered 2011-09-11: 4 mg via INTRAVENOUS
  Filled 2011-09-11: qty 2

## 2011-09-11 MED ORDER — ACETAMINOPHEN 325 MG PO TABS: 650.0000 mg | ORAL_TABLET | Freq: Four times a day (QID) | ORAL | Status: DC | PRN

## 2011-09-11 MED ORDER — IOHEXOL 300 MG/ML  SOLN
20.0000 mL | INTRAMUSCULAR | Status: AC
  Administered 2011-09-11 (×2): 20 mL via ORAL

## 2011-09-11 MED ORDER — PIPERACILLIN-TAZOBACTAM 3.375 G IVPB
3.3750 g | Freq: Three times a day (TID) | INTRAVENOUS | Status: DC
  Administered 2011-09-11 – 2011-09-17 (×17): 3.375 g via INTRAVENOUS
  Filled 2011-09-11 (×20): qty 50

## 2011-09-11 MED ORDER — LEVOTHYROXINE SODIUM 150 MCG PO TABS
150.0000 ug | ORAL_TABLET | Freq: Every day | ORAL | Status: DC
  Administered 2011-09-12 – 2011-09-17 (×6): 150 ug via ORAL
  Filled 2011-09-11 (×6): qty 1

## 2011-09-11 MED ORDER — DIPHENHYDRAMINE HCL 50 MG/ML IJ SOLN: 12.5000 mg | Freq: Four times a day (QID) | INTRAMUSCULAR | Status: DC | PRN

## 2011-09-11 MED ORDER — ASPIRIN EC 325 MG PO TBEC: 325.0000 mg | DELAYED_RELEASE_TABLET | Freq: Every day | ORAL | Status: DC

## 2011-09-11 MED ORDER — IMIPRAMINE HCL 50 MG PO TABS
75.0000 mg | ORAL_TABLET | Freq: Every day | ORAL | Status: DC
  Administered 2011-09-11 – 2011-09-16 (×6): 75 mg via ORAL
  Filled 2011-09-11 (×8): qty 1

## 2011-09-11 MED ORDER — MORPHINE SULFATE 4 MG/ML IJ SOLN
4.0000 mg | Freq: Once | INTRAMUSCULAR | Status: AC
  Administered 2011-09-11: 4 mg via INTRAVENOUS
  Filled 2011-09-11: qty 1

## 2011-09-11 MED ORDER — GI COCKTAIL ~~LOC~~
30.0000 mL | Freq: Once | ORAL | Status: AC
  Administered 2011-09-11: 30 mL via ORAL
  Filled 2011-09-11: qty 30

## 2011-09-11 MED ORDER — CHLORHEXIDINE GLUCONATE CLOTH 2 % EX PADS
6.0000 | MEDICATED_PAD | Freq: Once | CUTANEOUS | Status: AC
  Administered 2011-09-12: 6 via TOPICAL

## 2011-09-11 MED ORDER — KCL IN DEXTROSE-NACL 20-5-0.45 MEQ/L-%-% IV SOLN
INTRAVENOUS | Status: DC
  Administered 2011-09-11 – 2011-09-14 (×4): via INTRAVENOUS
  Filled 2011-09-11 (×12): qty 1000

## 2011-09-11 MED ORDER — IOHEXOL 300 MG/ML  SOLN
100.0000 mL | Freq: Once | INTRAMUSCULAR | Status: AC | PRN
  Administered 2011-09-11: 100 mL via INTRAVENOUS

## 2011-09-11 MED ORDER — PANTOPRAZOLE SODIUM 40 MG IV SOLR
40.0000 mg | Freq: Once | INTRAVENOUS | Status: AC
  Administered 2011-09-11: 40 mg via INTRAVENOUS
  Filled 2011-09-11: qty 40

## 2011-09-11 MED ORDER — DIAZEPAM 5 MG PO TABS
5.0000 mg | ORAL_TABLET | Freq: Four times a day (QID) | ORAL | Status: DC
  Administered 2011-09-11 – 2011-09-17 (×17): 5 mg via ORAL
  Filled 2011-09-11 (×21): qty 1

## 2011-09-11 MED ORDER — PIPERACILLIN-TAZOBACTAM 3.375 G IVPB 30 MIN: 3.3750 g | INTRAVENOUS | Status: DC

## 2011-09-11 MED ORDER — PANTOPRAZOLE SODIUM 40 MG IV SOLR
40.0000 mg | Freq: Every day | INTRAVENOUS | Status: DC
  Administered 2011-09-11 – 2011-09-14 (×4): 40 mg via INTRAVENOUS
  Filled 2011-09-11 (×5): qty 40

## 2011-09-11 MED ORDER — HYDROMORPHONE HCL PF 1 MG/ML IJ SOLN
0.5000 mg | INTRAMUSCULAR | Status: DC | PRN
  Administered 2011-09-11 (×3): 2 mg via INTRAVENOUS
  Administered 2011-09-11: 1 mg via INTRAVENOUS
  Administered 2011-09-12: 2 mg via INTRAVENOUS
  Administered 2011-09-12 (×2): 1 mg via INTRAVENOUS
  Administered 2011-09-12 (×2): 2 mg via INTRAVENOUS
  Administered 2011-09-12 – 2011-09-13 (×3): 1 mg via INTRAVENOUS
  Administered 2011-09-13 (×2): 2 mg via INTRAVENOUS
  Administered 2011-09-13 (×2): 1 mg via INTRAVENOUS
  Administered 2011-09-13: 2 mg via INTRAVENOUS
  Administered 2011-09-13: 1 mg via INTRAVENOUS
  Administered 2011-09-13: 2 mg via INTRAVENOUS
  Administered 2011-09-13 – 2011-09-14 (×4): 1 mg via INTRAVENOUS
  Administered 2011-09-14: 2 mg via INTRAVENOUS
  Administered 2011-09-15: 1 mg via INTRAVENOUS
  Filled 2011-09-11 (×2): qty 2
  Filled 2011-09-11: qty 1
  Filled 2011-09-11: qty 2
  Filled 2011-09-11: qty 1
  Filled 2011-09-11 (×2): qty 2
  Filled 2011-09-11 (×3): qty 1
  Filled 2011-09-11: qty 2
  Filled 2011-09-11 (×3): qty 1
  Filled 2011-09-11: qty 2
  Filled 2011-09-11 (×3): qty 1
  Filled 2011-09-11: qty 2
  Filled 2011-09-11 (×2): qty 1
  Filled 2011-09-11 (×3): qty 2
  Filled 2011-09-11 (×2): qty 1

## 2011-09-11 NOTE — ED Notes (Signed)
General surgery consulting phy at bedside.

## 2011-09-11 NOTE — ED Notes (Signed)
Patient returned from CT. Patient remains on monitor and stats of 98%  RA. Patient c/o abdominal pain. Family at bedside.

## 2011-09-11 NOTE — ED Notes (Signed)
Patient remain on monitor and sats of 97% RA. Patient states his chest soreness is gone bu still has abdominal pain.

## 2011-09-11 NOTE — H&P (Addendum)
Not having much pain currently.  Will probably be a very difficult gallbladder.  The patient has an ileus.  Dean Cole. Gae Bon, MD, FACS [phone] [facility]

## 2011-09-11 NOTE — ED Notes (Signed)
[id number] Ready 

## 2011-09-11 NOTE — ED Notes (Signed)
First meeting with patient. 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 Patient denies chest pain and denies SOB. Patient states he had open heart surgery in March 2013. Patient denies  N/V/D but states she has been having chills x 2 days. Patient placed on monitor and sats 96% on RA. EKG captured on arrival to [facility].

## 2011-09-11 NOTE — ED Notes (Signed)
First attempt to call report to [phone]. Nurse did not pick up phone.

## 2011-09-11 NOTE — Consult Note (Signed)
Referring Physician: Lindie Spruce Primary Cardiologist: Patty Sermons Reason for Consultation: Pre-op evaluation for cholecystectomy  HPI:  Dean Cole is a 61 yo male with h/o HTN, HL and DM2 and CAD. Recently found to have severe CAD with critical LM disease and normal EF and underwent a 4 vessel CABG on July 10, 2011. Now admitted with acute cholecystitis.   He has done well since CABG. Going to [facility] cardiac rehab on a weekly basis. He denies chest pain or dyspnea. No HF.   Two days ago he developed some RUQ abdominal pain. He denies any nausea or vomiting. He comes in today to the [facility] where he had a CT scan which reveals an adynamic ileus with findings of acute cholecystitis. The patient admits to SOB secondary to pain. It makes his abdomen hurt to take in deep breaths. His LFTs are essentially normal and his WBC are around 15K. He is pending cholecystectomy tomorrow. We have been asked to see him for pre-op eval.   Review of Systems:     Cardiac Review of Systems: {Y] = yes [ ]  = no  Chest Pain [    ]  Resting SOB [   ] Exertional SOB  [  ]  Orthopnea [  ]   Pedal Edema [   ]    Palpitations [  ] Syncope  [  ]   Presyncope [   ]  General Review of Systems: [Y] = yes [  ]=no Constitional: recent weight change [  ]; anorexia [  ]; fatigue [  ]; nausea [  ]; night sweats Cove.Etienne  ]; fever Cove.Etienne  ]; or chills [ y ];                                                                                                                                          Dental: poor dentition[  ];   Eye : blurred vision [  ]; diplopia [   ]; vision changes [  ];  Amaurosis fugax[  ]; Resp: cough [  ];  wheezing[  ];  hemoptysis[  ]; shortness of breath[  ]; paroxysmal nocturnal dyspnea[  ]; dyspnea on exertion[  ]; or orthopnea[  ];  GI:  gallstones[  ], vomiting[  ];  dysphagia[  ]; melena[  ];  hematochezia [  ]; heartburn[  ];  Ab pain [y] GU: kidney stones [  ]; hematuria[  ];   dysuria [  ];  nocturia[  ];  history  of     obstruction [  ];                 Skin: rash, swelling[  ];, hair loss[  ];  peripheral edema[  ];  or itching[  ]; Musculosketetal: myalgias[  ];  joint swelling[  ];  joint erythema[  ];  joint pain[  ];  back pain[  ];  Heme/Lymph: bruising[  ];  bleeding[  ];  anemia[  ];  Neuro: TIA[  ];  headaches[  ];  stroke[  ];  vertigo[  ];  seizures[  ];   paresthesias[  ];  difficulty walking[  ];  Psych:depression[  ]; anxiety[  ];  Endocrine: diabetes[  y];  thyroid dysfunction[  ];  Other:  Past Medical History  Diagnosis Date  . HTN (hypertension)   . Hypothyroidism   . Borderline diabetes   . Coronary artery disease   . Angina   . Shortness of breath   . Cancer     skin cancer  basel cell carcinoma  . Headache   . Restless leg syndrome     Medications Prior to Admission  Medication Sig Dispense Refill  . aspirin EC 325 MG EC tablet Take 1 tablet (325 mg total) by mouth daily.  30 tablet    . diazepam (VALIUM) 5 MG tablet Take 5 mg by mouth 4 (four) times daily. For nerves      . imipramine (TOFRANIL) 25 MG tablet Take 75 mg by mouth at bedtime.      Marland Kitchen levothyroxine (SYNTHROID, LEVOTHROID) 150 MCG tablet Take 150 mcg by mouth daily.      . metoprolol tartrate (LOPRESSOR) 25 MG tablet Take 25 mg by mouth 2 (two) times daily.      . polyethylene glycol (MIRALAX / GLYCOLAX) packet Take 17 g by mouth daily. As needed      . simvastatin (ZOCOR) 20 MG tablet Take 1 tablet (20 mg total) by mouth daily at 6 PM.  30 tablet  5        . aspirin EC  325 mg Oral Daily  . diazepam  5 mg Oral QID  . gi cocktail  30 mL Oral Once  . imipramine  75 mg Oral QHS  . iohexol  20 mL Oral Q1 Hr x 2  . levothyroxine  150 mcg Oral Daily  . metoprolol tartrate  25 mg Oral BID  .  morphine injection  4 mg Intravenous Once  .  morphine injection  4 mg Intravenous Once  . pantoprazole (PROTONIX) IV  40 mg Intravenous Once  . pantoprazole (PROTONIX) IV  40 mg Intravenous QHS  .  piperacillin-tazobactam (ZOSYN)  IV  3.375 g Intravenous Q8H  . sodium chloride  500 mL Intravenous Once  . DISCONTD: piperacillin-tazobactam  3.375 g Intravenous To Major    Infusions:    . dextrose 5 % and 0.45 % NaCl with KCl 20 mEq/L 125 mL/hr at 09/11/11 1754    No Known Allergies  History   Social History  . Marital Status: Married    Spouse Name: N/A    Number of Children: N/A  . Years of Education: N/A   Occupational History  . Not on file.   Social History Main Topics  . Smoking status: Former Smoker    Quit date: 08/08/1981  . Smokeless tobacco: Former Neurosurgeon  . Alcohol Use: No     "mixed drink every now and then"  . Drug Use: No  . Sexually Active: Yes   Other Topics Concern  . Not on file   Social History Narrative  . No narrative on file    Family History  Problem Relation Age of Onset  . Coronary artery disease Father   . Coronary artery disease Paternal Grandfather     PHYSICAL EXAM: Filed Vitals:   09/11/11 1500  BP: 122/80  Pulse: 118  Temp:   Resp:     No intake or output data in the 24 hours ending 09/11/11 1801  General:  Diaphoretic No respiratory difficulty HEENT: normal  Multiple tattoos and piercings  Neck: supple. no JVD. Carotids 2+ bilat; no bruits. No lymphadenopathy or thryomegaly appreciated. Cor: PMI nondisplaced. Tachy. regular rate & rhythm. No rubs, gallops or murmurs. Lungs: clear Abdomen: soft, tender to palpation in RUQ, nondistended.No bruits or masses. Hypoactive bowel sounds. + guarding Extremities: no cyanosis, clubbing, rash, edema. Multiple tattos Neuro: alert & oriented x 3, cranial nerves grossly intact. moves all 4 extremities w/o difficulty. Affect pleasant.  ECG: Sinus tach 101 Non-specific ST-T wave abnormalities.    Results for orders placed during the hospital encounter of 09/11/11 (from the past 24 hour(s))  CBC     Status: Abnormal   Collection Time   09/11/11  8:36 AM      Component Value Range    WBC 15.3 (*) 4.0 - 10.5 (K/uL)   RBC 4.99  4.22 - 5.81 (MIL/uL)   Hemoglobin 14.0  13.0 - 17.0 (g/dL)   HCT 11.9  14.7 - 82.9 (%)   MCV 84.2  78.0 - 100.0 (fL)   MCH 28.1  26.0 - 34.0 (pg)   MCHC 33.3  30.0 - 36.0 (g/dL)   RDW 56.2  13.0 - 86.5 (%)   Platelets 163  150 - 400 (K/uL)  DIFFERENTIAL     Status: Abnormal   Collection Time   09/11/11  8:36 AM      Component Value Range   Neutrophils Relative 85 (*) 43 - 77 (%)   Neutro Abs 13.0 (*) 1.7 - 7.7 (K/uL)   Lymphocytes Relative 7 (*) 12 - 46 (%)   Lymphs Abs 1.0  0.7 - 4.0 (K/uL)   Monocytes Relative 8  3 - 12 (%)   Monocytes Absolute 1.2 (*) 0.1 - 1.0 (K/uL)   Eosinophils Relative 0  0 - 5 (%)   Eosinophils Absolute 0.0  0.0 - 0.7 (K/uL)   Basophils Relative 0  0 - 1 (%)   Basophils Absolute 0.0  0.0 - 0.1 (K/uL)  COMPREHENSIVE METABOLIC PANEL     Status: Abnormal   Collection Time   09/11/11  8:36 AM      Component Value Range   Sodium 131 (*) 135 - 145 (mEq/L)   Potassium 3.5  3.5 - 5.1 (mEq/L)   Chloride 92 (*) 96 - 112 (mEq/L)   CO2 26  19 - 32 (mEq/L)   Glucose, Bld 162 (*) 70 - 99 (mg/dL)   BUN 14  6 - 23 (mg/dL)   Creatinine, Ser 7.84  0.50 - 1.35 (mg/dL)   Calcium 9.9  8.4 - 69.6 (mg/dL)   Total Protein 8.1  6.0 - 8.3 (g/dL)   Albumin 3.9  3.5 - 5.2 (g/dL)   AST 21  0 - 37 (U/L)   ALT 14  0 - 53 (U/L)   Alkaline Phosphatase 143 (*) 39 - 117 (U/L)   Total Bilirubin 0.6  0.3 - 1.2 (mg/dL)   GFR calc non Af Amer 89 (*) >90 (mL/min)   GFR calc Af Amer >90  >90 (mL/min)  LIPASE, BLOOD     Status: Normal   Collection Time   09/11/11  8:36 AM      Component Value Range   Lipase 19  11 - 59 (U/L)  CARDIAC PANEL(CRET KIN+CKTOT+MB+TROPI)     Status: Normal   Collection Time   09/11/11  8:36 AM      Component Value Range   Total CK 106  7 - 232 (U/L)   CK, MB 1.6  0.3 - 4.0 (ng/mL)   Troponin I <0.30  <0.30 (ng/mL)   Relative Index 1.5  0.0 - 2.5   URINALYSIS, ROUTINE W REFLEX MICROSCOPIC     Status: Abnormal     Collection Time   09/11/11 11:06 AM      Component Value Range   Color, Urine AMBER (*) YELLOW    APPearance CLOUDY (*) CLEAR    Specific Gravity, Urine 1.028  1.005 - 1.030    pH 6.0  5.0 - 8.0    Glucose, UA 100 (*) NEGATIVE (mg/dL)   Hgb urine dipstick NEGATIVE  NEGATIVE    Bilirubin Urine SMALL (*) NEGATIVE    Ketones, ur 15 (*) NEGATIVE (mg/dL)   Protein, ur NEGATIVE  NEGATIVE (mg/dL)   Urobilinogen, UA 1.0  0.0 - 1.0 (mg/dL)   Nitrite NEGATIVE  NEGATIVE    Leukocytes, UA TRACE (*) NEGATIVE   URINE MICROSCOPIC-ADD ON     Status: Abnormal   Collection Time   09/11/11 11:06 AM      Component Value Range   Squamous Epithelial / LPF RARE  RARE    WBC, UA 0-2  <3 (WBC/hpf)   RBC / HPF 0-2  <3 (RBC/hpf)   Bacteria, UA RARE  RARE    Casts HYALINE CASTS (*) NEGATIVE    Urine-Other AMORPHOUS URATES/PHOSPHATES     Ct Abdomen Pelvis W Contrast  09/11/2011  *RADIOLOGY REPORT*  Clinical Data: Abdominal pain.  CT ABDOMEN AND PELVIS WITH CONTRAST  Technique:  Multidetector CT imaging of the abdomen and pelvis was performed following the standard protocol during bolus administration of intravenous contrast.  Contrast: OMNIPAQUE IOHEXOL 300 MG/ML  SOLN  Comparison: Plain films 09/11/2011  Findings: There is a small pleural effusion and pericardial effusion.  Atelectasis noted in both lower lungs, left greater than right.  Changes of prior CABG.  Heart is borderline in size.  There is significant inflammation around the gallbladder with distention of the gallbladder.  Findings compatible with acute cholecystitis.  The fluid noted adjacent to the descending duodenum and right side of the colon, secondary from cholecystitis.  No visible stones by CT.  Small low-density areas within the liver, likely small cysts. Spleen, pancreas, adrenals and kidneys are unremarkable.  Mild gaseous distention throughout the colon, likely mild ileus. Small bowel is decompressed.  Incidentally noted is a short  segment small bowel intussusception in the left upper quadrant, likely a transit and and incidental finding.  Doubt of clinical significance.  IMPRESSION: Marked inflammatory change around the gallbladder which is distended.  Findings compatible with acute cholecystitis.  Suspect mild associated adynamic ileus of the colon.  Original Report Authenticated By: Cyndie Chime, M.D.   Dg Abd Acute W/chest  09/11/2011  *RADIOLOGY REPORT*  Clinical Data: Abdominal pain and distention.  ACUTE ABDOMEN SERIES (ABDOMEN 2 VIEW & CHEST 1 VIEW)  Comparison: 03/14/2013abdominal radiography.  08/09/2011 chest radiography.  Findings: There is a moderate amount of intestinal gas, primarily colonic.  There are air-fluid levels in the right colon.  Maximal diameter of the right colon is approximately 14 cm.  No dilated small bowel is seen.  There is less gaseous distention than was seen previously.  No free air.  No abnormal calcifications or bony findings.  One-view chest shows chronic linear scars in the lung bases.  There is slightly  more atelectasis at the lung bases than was seen on 08/09/2011.  IMPRESSION: Gaseous distention of the intestine, primarily the colon, though less pronounced than was seen in 08/08/22.  The pattern could relate to chronic pseudo-obstruction or ileus.  No free air.  Linear pulmonary scarring with slightly more atelectasis than seen previously.  Original Report Authenticated By: Thomasenia Sales, M.D.     ASSESSMENT: 1. Acute cholecystitis 2. CAD s/p CABG 3/13    --normal EF 3. HTN 4. HL 5. DM2  PLAN/DISCUSSION:  He has done well since CABG. I feel he is relatively low-risk for surgery from a cardiac perspective and can got the OR without further cardiac testing. He is tachycardiac and diaphoretic so would hydrate him judiciously. Would continue metoprolol through the peri-operative window as BP tolerates. We will follow as needed.  Dean Cole 6:19 PM

## 2011-09-11 NOTE — ED Provider Notes (Signed)
History     CSN: [id number]  Arrival date & time 09/11/11  4098   First MD Initiated Contact with Patient 09/11/11 [phone]      CC: Abdominal pain  (Consider location/radiation/quality/duration/timing/severity/associated sxs/prior treatment) HPI Pt p/w LUQ/epigastirc pain radiating to back x 2 days. No worse with palpation or movement. Pt specifically denies chest pain, nausea, vomiting. + constipation. Pt had 4 vessel CABG 3/13 and has been in rehab since. No fever, SOB, cough, leg swelling/pain Past Medical History  Diagnosis Date  . HTN (hypertension)   . Hypothyroidism   . Borderline diabetes   . Coronary artery disease   . Angina   . Shortness of breath   . Cancer     skin cancer  basel cell carcinoma  . Headache   . Restless leg syndrome   . Cholecystitis   . Blood transfusion     "no reaction to transfusions"  . Anxiety     Past Surgical History  Procedure Date  . Cardiac catheterization 07/07/2011  . Coronary artery bypass graft 07/10/2011    Procedure: CORONARY ARTERY BYPASS GRAFTING (CABG);  Surgeon: Kerin Perna, MD;  Location: [facility] OR;  Service: Open Heart Surgery;  Laterality: N/A;  . Excisional hemorrhoidectomy     Family History  Problem Relation Age of Onset  . Coronary artery disease Father   . Coronary artery disease Paternal Grandfather     History  Substance Use Topics  . Smoking status: Former Smoker    Quit date: 08/08/1981  . Smokeless tobacco: Former Neurosurgeon  . Alcohol Use: No     "mixed drink every now and then"      Review of Systems  Constitutional: Positive for chills. Negative for fever.  HENT: Negative for neck pain.   Respiratory: Negative for cough and shortness of breath.   Cardiovascular: Negative for chest pain, palpitations and leg swelling.  Gastrointestinal: Positive for abdominal pain and constipation. Negative for nausea, vomiting, diarrhea and blood in stool.  Genitourinary: Negative for dysuria, hematuria and flank  pain.  Musculoskeletal: Positive for back pain.  Skin: Negative for rash and wound.  Neurological: Negative for dizziness, weakness and numbness.    Allergies  Review of patient's allergies indicates no known allergies.  Home Medications   No current outpatient prescriptions on file.  BP 118/74  Pulse 90  Temp(Src) 97.9 F (36.6 C) (Oral)  Resp 19  Ht 5\' 10"  (1.778 m)  Wt 195 lb 4.8 oz (88.587 kg)  BMI 28.02 kg/m2  SpO2 98%  Physical Exam  Nursing note and vitals reviewed. Constitutional: He is oriented to person, place, and time. He appears well-developed and well-nourished. No distress.  HENT:  Head: Normocephalic and atraumatic.  Mouth/Throat: Oropharynx is clear and moist.  Eyes: EOM are normal. Pupils are equal, round, and reactive to light.  Neck: Normal range of motion. Neck supple.  Cardiovascular: Normal rate and regular rhythm.   Pulmonary/Chest: Effort normal and breath sounds normal. No respiratory distress. He has no wheezes. He has no rales. He exhibits no tenderness.       Well healing surgical chest scar  Abdominal: Soft. Bowel sounds are normal. He exhibits no mass. There is tenderness (mild LUQ/epigastric TTP, No rebound or guarding). There is no rebound and no guarding.  Musculoskeletal: Normal range of motion. He exhibits no edema and no tenderness.  Neurological: He is alert and oriented to person, place, and time.  Skin: Skin is warm and dry. No rash noted. No erythema.  Psychiatric: He has a normal mood and affect. His behavior is normal.    ED Course  Procedures (including critical care time)  Labs Reviewed  CBC - Abnormal; Notable for the following:    WBC 15.3 (*)    All other components within normal limits  DIFFERENTIAL - Abnormal; Notable for the following:    Neutrophils Relative 85 (*)    Neutro Abs 13.0 (*)    Lymphocytes Relative 7 (*)    Monocytes Absolute 1.2 (*)    All other components within normal limits  COMPREHENSIVE  METABOLIC PANEL - Abnormal; Notable for the following:    Sodium 131 (*)    Chloride 92 (*)    Glucose, Bld 162 (*)    Alkaline Phosphatase 143 (*)    GFR calc non Af Amer 89 (*)    All other components within normal limits  URINALYSIS, ROUTINE W REFLEX MICROSCOPIC - Abnormal; Notable for the following:    Color, Urine AMBER (*) BIOCHEMICALS MAY BE AFFECTED BY COLOR   APPearance CLOUDY (*)    Glucose, UA 100 (*)    Bilirubin Urine SMALL (*)    Ketones, ur 15 (*)    Leukocytes, UA TRACE (*)    All other components within normal limits  URINE MICROSCOPIC-ADD ON - Abnormal; Notable for the following:    Casts HYALINE CASTS (*)    All other components within normal limits  COMPREHENSIVE METABOLIC PANEL - Abnormal; Notable for the following:    Chloride 95 (*)    Glucose, Bld 144 (*)    BUN 28 (*)    Creatinine, Ser 2.09 (*) DELTA CHECK NOTED   Albumin 3.4 (*)    Alkaline Phosphatase 171 (*)    GFR calc non Af Amer 33 (*)    GFR calc Af Amer 38 (*)    All other components within normal limits  CBC - Abnormal; Notable for the following:    WBC 22.6 (*)    All other components within normal limits  PROTIME-INR - Abnormal; Notable for the following:    Prothrombin Time 18.0 (*)    All other components within normal limits  URINALYSIS, ROUTINE W REFLEX MICROSCOPIC - Abnormal; Notable for the following:    Color, Urine AMBER (*) BIOCHEMICALS MAY BE AFFECTED BY COLOR   Bilirubin Urine MODERATE (*)    Ketones, ur 15 (*)    Protein, ur 100 (*)    Nitrite POSITIVE (*)    Leukocytes, UA SMALL (*)    All other components within normal limits  URINE MICROSCOPIC-ADD ON - Abnormal; Notable for the following:    Bacteria, UA MANY (*)    All other components within normal limits  CBC - Abnormal; Notable for the following:    WBC 14.2 (*)    RBC 3.86 (*)    Hemoglobin 10.7 (*)    HCT 32.9 (*)    All other components within normal limits  BASIC METABOLIC PANEL - Abnormal; Notable for the  following:    Sodium 132 (*)    Chloride 95 (*)    BUN 36 (*)    Creatinine, Ser 1.53 (*)    GFR calc non Af Amer 48 (*)    GFR calc Af Amer 55 (*)    All other components within normal limits  COMPREHENSIVE METABOLIC PANEL - Abnormal; Notable for the following:    Glucose, Bld 121 (*)    Albumin 2.7 (*)    Alkaline Phosphatase 133 (*)  GFR calc non Af Amer 70 (*)    GFR calc Af Amer 82 (*)    All other components within normal limits  CBC - Abnormal; Notable for the following:    RBC 3.79 (*)    Hemoglobin 10.2 (*)    HCT 32.5 (*)    All other components within normal limits  LIPASE, BLOOD  CARDIAC PANEL(CRET KIN+CKTOT+MB+TROPI)  SURGICAL PCR SCREEN  BODY FLUID CULTURE  DIFFERENTIAL  SURGICAL PCR SCREEN  CBC  DIFFERENTIAL  COMPREHENSIVE METABOLIC PANEL   No results found.   1. Acute cholecystitis   2. Coronary artery disease       Date: 09/11/2011  Rate: 101  Rhythm: sinus tachycardia  QRS Axis: normal  Intervals: normal  ST/T Wave abnormalities: nonspecific ST changes  Conduction Disutrbances:none  Narrative Interpretation:   Old EKG Reviewed: unchanged   MDM          Loren Racer, MD 09/14/11 2349

## 2011-09-11 NOTE — H&P (Signed)
[name] 09-16-1950  [id number].   Primary Care MD: Dr. Patty Sermons Requesting MD: Dr. Ranae Palms Chief Complaint/Reason for Consult: abdominal pain HPI: This is a 61 yo male who just underwent a 4 vessel CABG on July 10, 2011.  He has done well since then.  He denies chest pain.  2 days ago he developed some RUQ abdominal pain.  He denies any nausea or vomiting.  He admits to constipation, but no diarrhea.  He has drank anything in about 2 days, and has eaten minimal as this causes worsening pain.  He denies any fevers at home.  He comes in today to the [facility] where he had a CT scan which reveals an adynamic ileus with findings of acute cholecystitis.  The patient admits to SOB secondary to pain.  It makes his abdomen hurt to take in deep breaths.    His LFTs are essentially normal and his WBC are around 15K.  We have been asked to see him for admission.  Review of Systems: Please see HPI, otherwise all other systems have been reviewed and are negative.  Family History  Problem Relation Age of Onset  . Coronary artery disease Father   . Coronary artery disease Paternal Grandfather     Past Medical History  Diagnosis Date  . HTN (hypertension)   . Hypothyroidism   . Borderline diabetes   . Coronary artery disease   . Angina   . Shortness of breath   . Cancer     skin cancer  basel cell carcinoma  . Headache   . Restless leg syndrome     Past Surgical History  Procedure Date  . Cardiac catheterization 07/07/2011  . Coronary artery bypass graft 07/10/2011    Procedure: CORONARY ARTERY BYPASS GRAFTING (CABG);  Surgeon: Kerin Perna, MD;  Location: [facility] OR;  Service: Open Heart Surgery;  Laterality: N/A;  . Excisional hemorrhoidectomy     Social History:  reports that he quit smoking about 30 years ago. He has quit using smokeless tobacco. He reports that he does not drink alcohol or use illicit drugs.  Allergies: No Known Allergies   (Not in a hospital admission)  Blood  pressure 146/81, pulse 118, temperature 98 F (36.7 C), temperature source Oral, resp. rate 20, SpO2 98.00%. Physical Exam: General: pleasant, WD, WN white male who is sitting up but in some acute distress secondary to pain. HEENT: head is normocephalic, atraumatic.  Sclera are noninjected.  PERRL.  Ears and nose without any masses or lesions.  Mouth is pink and dry. Heart: regular rhythm, but tachycardic.  Normal s1,s2. No obvious murmurs, gallops, or rubs noted.  Palpable radial and pedal pulses bilaterally Lungs: CTAB, no wheezes, rhonchi, or rales noted.  Respiratory effort is mildly labored secondary to pain with secondary tachypnea.  His O2 sats decrease to high 80s when he lays down and becomes more tachypnic secondary to pain. Abd: diffuse voluntary guarding.  Tender in the RUQ with a + Murphy's sign.  The patient is somewhat difficult to examine as he will not relax well enough to fully palpate his abdomen.  Absent BS, minimal bloating.  No masses, hernias, or organomegaly are able to be palpated. MS: all 4 extremities are symmetrical with no cyanosis, clubbing, or edema. Skin: warm and dry with no masses, lesions, or rashes.  He has multiple tattoos with multiple piercings including his nipples, eyebrow ridges, and ears. Psych: A&Ox3 with some flight of ideas occasionally.      Results  for orders placed during the hospital encounter of 09/11/11 (from the past 48 hour(s))  CBC     Status: Abnormal   Collection Time   09/11/11  8:36 AM      Component Value Range Comment   WBC 15.3 (*) 4.0 - 10.5 (K/uL)    RBC 4.99  4.22 - 5.81 (MIL/uL)    Hemoglobin 14.0  13.0 - 17.0 (g/dL)    HCT 40.9  81.1 - 91.4 (%)    MCV 84.2  78.0 - 100.0 (fL)    MCH 28.1  26.0 - 34.0 (pg)    MCHC 33.3  30.0 - 36.0 (g/dL)    RDW 78.2  95.6 - 21.3 (%)    Platelets 163  150 - 400 (K/uL)   DIFFERENTIAL     Status: Abnormal   Collection Time   09/11/11  8:36 AM      Component Value Range Comment   Neutrophils  Relative 85 (*) 43 - 77 (%)    Neutro Abs 13.0 (*) 1.7 - 7.7 (K/uL)    Lymphocytes Relative 7 (*) 12 - 46 (%)    Lymphs Abs 1.0  0.7 - 4.0 (K/uL)    Monocytes Relative 8  3 - 12 (%)    Monocytes Absolute 1.2 (*) 0.1 - 1.0 (K/uL)    Eosinophils Relative 0  0 - 5 (%)    Eosinophils Absolute 0.0  0.0 - 0.7 (K/uL)    Basophils Relative 0  0 - 1 (%)    Basophils Absolute 0.0  0.0 - 0.1 (K/uL)   COMPREHENSIVE METABOLIC PANEL     Status: Abnormal   Collection Time   09/11/11  8:36 AM      Component Value Range Comment   Sodium 131 (*) 135 - 145 (mEq/L)    Potassium 3.5  3.5 - 5.1 (mEq/L)    Chloride 92 (*) 96 - 112 (mEq/L)    CO2 26  19 - 32 (mEq/L)    Glucose, Bld 162 (*) 70 - 99 (mg/dL)    BUN 14  6 - 23 (mg/dL)    Creatinine, Ser 0.86  0.50 - 1.35 (mg/dL)    Calcium 9.9  8.4 - 10.5 (mg/dL)    Total Protein 8.1  6.0 - 8.3 (g/dL)    Albumin 3.9  3.5 - 5.2 (g/dL)    AST 21  0 - 37 (U/L)    ALT 14  0 - 53 (U/L)    Alkaline Phosphatase 143 (*) 39 - 117 (U/L)    Total Bilirubin 0.6  0.3 - 1.2 (mg/dL)    GFR calc non Af Amer 89 (*) >90 (mL/min)    GFR calc Af Amer >90  >90 (mL/min)   LIPASE, BLOOD     Status: Normal   Collection Time   09/11/11  8:36 AM      Component Value Range Comment   Lipase 19  11 - 59 (U/L)   CARDIAC PANEL(CRET KIN+CKTOT+MB+TROPI)     Status: Normal   Collection Time   09/11/11  8:36 AM      Component Value Range Comment   Total CK 106  7 - 232 (U/L)    CK, MB 1.6  0.3 - 4.0 (ng/mL)    Troponin I <0.30  <0.30 (ng/mL)    Relative Index 1.5  0.0 - 2.5    URINALYSIS, ROUTINE W REFLEX MICROSCOPIC     Status: Abnormal   Collection Time   09/11/11 11:06 AM  Component Value Range Comment   Color, Urine AMBER (*) YELLOW  BIOCHEMICALS MAY BE AFFECTED BY COLOR   APPearance CLOUDY (*) CLEAR     Specific Gravity, Urine 1.028  1.005 - 1.030     pH 6.0  5.0 - 8.0     Glucose, UA 100 (*) NEGATIVE (mg/dL)    Hgb urine dipstick NEGATIVE  NEGATIVE     Bilirubin Urine  SMALL (*) NEGATIVE     Ketones, ur 15 (*) NEGATIVE (mg/dL)    Protein, ur NEGATIVE  NEGATIVE (mg/dL)    Urobilinogen, UA 1.0  0.0 - 1.0 (mg/dL)    Nitrite NEGATIVE  NEGATIVE     Leukocytes, UA TRACE (*) NEGATIVE    URINE MICROSCOPIC-ADD ON     Status: Abnormal   Collection Time   09/11/11 11:06 AM      Component Value Range Comment   Squamous Epithelial / LPF RARE  RARE     WBC, UA 0-2  <3 (WBC/hpf)    RBC / HPF 0-2  <3 (RBC/hpf)    Bacteria, UA RARE  RARE     Casts HYALINE CASTS (*) NEGATIVE     Urine-Other AMORPHOUS URATES/PHOSPHATES      Ct Abdomen Pelvis W Contrast  09/11/2011  *RADIOLOGY REPORT*  Clinical Data: Abdominal pain.  CT ABDOMEN AND PELVIS WITH CONTRAST  Technique:  Multidetector CT imaging of the abdomen and pelvis was performed following the standard protocol during bolus administration of intravenous contrast.  Contrast: OMNIPAQUE IOHEXOL 300 MG/ML  SOLN  Comparison: Plain films 09/11/2011  Findings: There is a small pleural effusion and pericardial effusion.  Atelectasis noted in both lower lungs, left greater than right.  Changes of prior CABG.  Heart is borderline in size.  There is significant inflammation around the gallbladder with distention of the gallbladder.  Findings compatible with acute cholecystitis.  The fluid noted adjacent to the descending duodenum and right side of the colon, secondary from cholecystitis.  No visible stones by CT.  Small low-density areas within the liver, likely small cysts. Spleen, pancreas, adrenals and kidneys are unremarkable.  Mild gaseous distention throughout the colon, likely mild ileus. Small bowel is decompressed.  Incidentally noted is a short segment small bowel intussusception in the left upper quadrant, likely a transit and and incidental finding.  Doubt of clinical significance.  IMPRESSION: Marked inflammatory change around the gallbladder which is distended.  Findings compatible with acute cholecystitis.  Suspect mild  associated adynamic ileus of the colon.  Original Report Authenticated By: Cyndie Chime, M.D.   Dg Abd Acute W/chest  09/11/2011  *RADIOLOGY REPORT*  Clinical Data: Abdominal pain and distention.  ACUTE ABDOMEN SERIES (ABDOMEN 2 VIEW & CHEST 1 VIEW)  Comparison: 03/14/2013abdominal radiography.  08/09/2011 chest radiography.  Findings: There is a moderate amount of intestinal gas, primarily colonic.  There are air-fluid levels in the right colon.  Maximal diameter of the right colon is approximately 14 cm.  No dilated small bowel is seen.  There is less gaseous distention than was seen previously.  No free air.  No abnormal calcifications or bony findings.  One-view chest shows chronic linear scars in the lung bases.  There is slightly more atelectasis at the lung bases than was seen on 08/09/2011.  IMPRESSION: Gaseous distention of the intestine, primarily the colon, though less pronounced than was seen in Jul 30, 2022.  The pattern could relate to chronic pseudo-obstruction or ileus.  No free air.  Linear pulmonary scarring with slightly more  atelectasis than seen previously.  Original Report Authenticated By: Thomasenia Sales, M.D.       Assessment/Plan 1. Acute cholecystitis 2. S/p recent CABG 3. CAD 4. Hypothyroidism 5. Borderline DM  Plan: 1. Will get the patient admitted to the surgical service.  I have already called Dr. Yevonne Pax office to see if he or one of his partners can evaluate the patient for surgical clearance given his recent heart surgery.  The patient will be placed on IVFs, IV, abx, and prn IV medications.  He definitely has signs of cholecystitis and will need a cholecystectomy.  I have d/w the patient.  His urine is very dark and I suspect the patient is quite dehydrated.  We will place him on quite a bit of IVFs initially as I suspect this will help his tachycardia as well.  He will remain NPO.  I have d/w Dr. Lindie Spruce. Syretta Kochel E 09/11/2011, 1:50 PM

## 2011-09-11 NOTE — Progress Notes (Signed)
ANTIBIOTIC CONSULT NOTE - INITIAL  Pharmacy Consult for Zosyn Indication: empiric coverage for gallbladder infection  No Known Allergies  Patient Measurements:    Vital Signs: Temp: 98 F (36.7 C) (05/13 0819) Temp src: Oral (05/13 0819) BP: 111/84 mmHg (05/13 1418) Pulse Rate: 107  (05/13 1418) Intake/Output from previous day:   Intake/Output from this shift:    Labs:  Basename 09/11/11 0836  WBC 15.3*  HGB 14.0  PLT 163  LABCREA --  CREATININE 0.94   Estimated CrCl ~ 95   Microbiology: No results found for this or any previous visit (from the past 720 hour(s)).  Medical History: Past Medical History  Diagnosis Date  . HTN (hypertension)   . Hypothyroidism   . Borderline diabetes   . Coronary artery disease   . Angina   . Shortness of breath   . Cancer     skin cancer  basel cell carcinoma  . Headache   . Restless leg syndrome     Medications:   (Not in a hospital admission)  Assessment: 61 yo M with 2 day hx of RUQ abdominal pain.  Pt has had little oral intake in last 2 days because it worsens his pain.  CT scan reveals adynamic ileus with acute cholecystitis.  PMH: CAD s/p CABG 07/10/11, HTN, Hypothyroidism, DM, hx skin cancer, restless leg syndrome  Goal of Therapy:  Renal dose adjustment of antibiotics  Plan:  Zosyn 3.375 gm IV q8h (4 hour infusion). Follow-up renal function, culture data, and clinical progress.  [employer], Pharm.D., BCPS Clinical Pharmacist Pager [phone] 09/11/2011 2:43 PM

## 2011-09-11 NOTE — ED Notes (Signed)
Second attempt to call report to 3700, nurse/charge nurse does not answer phone. Call placed at [phone] ended at 1536.

## 2011-09-11 NOTE — ED Notes (Signed)
Attempted to give the floor nurse report on this patient  At bedside before and was refused and refused hard copy of SBAR.

## 2011-09-11 NOTE — ED Notes (Signed)
Patient transported to X-ray 

## 2011-09-11 NOTE — ED Notes (Signed)
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 Hx of open heart surgery this year in march

## 2011-09-11 NOTE — ED Notes (Addendum)
Patient returned from xray. Patient c/o of soreness in chest and abdominal pain. Patient states soreness in his chest is from his surgery in March this year.

## 2011-09-11 NOTE — ED Notes (Addendum)
Patient transferred to 3700, room 3712. Patient placed on rooms monitor. Patient ssats at 100% on 3L.

## 2011-09-12 ENCOUNTER — Encounter (HOSPITAL_COMMUNITY): Admission: EM | Disposition: A | Discharge status: Home / Self Care | Payer: Self-pay | Source: Home / Self Care

## 2011-09-12 ENCOUNTER — Inpatient Hospital Stay (HOSPITAL_COMMUNITY): Payer: Medicare Other

## 2011-09-12 ENCOUNTER — Encounter (HOSPITAL_COMMUNITY): Payer: Self-pay | Admitting: General Practice

## 2011-09-12 LAB — COMPREHENSIVE METABOLIC PANEL
Albumin: 3.4 g/dL — ABNORMAL LOW (ref 3.5–5.2)
Alkaline Phosphatase: 171 U/L — ABNORMAL HIGH (ref 39–117)
BUN: 28 mg/dL — ABNORMAL HIGH (ref 6–23)
Calcium: 9.2 mg/dL (ref 8.4–10.5)
Creatinine, Ser: 2.09 mg/dL — ABNORMAL HIGH (ref 0.50–1.35)
GFR calc Af Amer: 38 mL/min — ABNORMAL LOW (ref >90)
Glucose, Bld: 144 mg/dL — ABNORMAL HIGH (ref 70–99)
Potassium: 4.7 mEq/L (ref 3.5–5.1)
Total Protein: 7.6 g/dL (ref 6.0–8.3)

## 2011-09-12 LAB — PROTIME-INR: INR: 1.46 (ref 0.00–1.49)

## 2011-09-12 LAB — CBC
HCT: 40.8 % (ref 39.0–52.0)
MCV: 85.2 fL (ref 78.0–100.0)
Platelets: 204 10*3/uL (ref 150–400)
RBC: 4.79 MIL/uL (ref 4.22–5.81)
RDW: 13.5 % (ref 11.5–15.5)
WBC: 22.6 10*3/uL — ABNORMAL HIGH (ref 4.0–10.5)

## 2011-09-12 LAB — URINE MICROSCOPIC-ADD ON

## 2011-09-12 LAB — URINALYSIS, ROUTINE W REFLEX MICROSCOPIC
Ketones, ur: 15 mg/dL — AB
Nitrite: POSITIVE — AB
Protein, ur: 100 mg/dL — AB
pH: 6 (ref 5.0–8.0)

## 2011-09-12 LAB — SURGICAL PCR SCREEN: MRSA, PCR: NEGATIVE

## 2011-09-12 SURGERY — LAPAROSCOPIC CHOLECYSTECTOMY WITH INTRAOPERATIVE CHOLANGIOGRAM
Anesthesia: General

## 2011-09-12 MED ORDER — MIDAZOLAM HCL 2 MG/2ML IJ SOLN
INTRAMUSCULAR | Status: AC
  Filled 2011-09-12: qty 6

## 2011-09-12 MED ORDER — FENTANYL CITRATE 0.05 MG/ML IJ SOLN
INTRAMUSCULAR | Status: AC
  Filled 2011-09-12: qty 6

## 2011-09-12 MED ORDER — IOHEXOL 300 MG/ML  SOLN: 50.0000 mL | Freq: Once | INTRAMUSCULAR | Status: AC | PRN

## 2011-09-12 MED ORDER — SODIUM CHLORIDE 0.9 % IV BOLUS (SEPSIS)
500.0000 mL | Freq: Once | INTRAVENOUS | Status: AC
  Administered 2011-09-12: 500 mL via INTRAVENOUS

## 2011-09-12 MED ORDER — METOPROLOL TARTRATE 1 MG/ML IV SOLN
5.0000 mg | Freq: Four times a day (QID) | INTRAVENOUS | Status: DC
  Administered 2011-09-12 – 2011-09-15 (×10): 5 mg via INTRAVENOUS
  Filled 2011-09-12 (×15): qty 5

## 2011-09-12 NOTE — Progress Notes (Signed)
Subjective:  Has rightsided chest pain with deep breathing related to his cholecystitis.  No angina  Objective:  Vital Signs in the last 24 hours: Temp:  [98 F (36.7 C)-100 F (37.8 C)] 98.6 F (37 C) (05/14 0746) Pulse Rate:  [90-132] 113  (05/14 0746) Resp:  [16-29] 18  (05/14 0746) BP: (100-172)/(68-98) 102/68 mmHg (05/14 0746) SpO2:  [83 %-100 %] 97 % (05/14 0746)  Intake/Output from previous day: 05/13 0701 - 05/14 0700 In: -  Out: 100 [Urine:100] Intake/Output from this shift:       . aspirin EC  325 mg Oral Daily  . Chlorhexidine Gluconate Cloth  6 each Topical Once  . diazepam  5 mg Oral QID  . gi cocktail  30 mL Oral Once  . imipramine  75 mg Oral QHS  . iohexol  20 mL Oral Q1 Hr x 2  . levothyroxine  150 mcg Oral Daily  . metoprolol  5 mg Intravenous Q6H  .  morphine injection  4 mg Intravenous Once  .  morphine injection  4 mg Intravenous Once  . pantoprazole (PROTONIX) IV  40 mg Intravenous Once  . pantoprazole (PROTONIX) IV  40 mg Intravenous QHS  . piperacillin-tazobactam (ZOSYN)  IV  3.375 g Intravenous Q8H  . sodium chloride  500 mL Intravenous Once  . DISCONTD: metoprolol tartrate  25 mg Oral BID  . DISCONTD: piperacillin-tazobactam  3.375 g Intravenous To Major      . dextrose 5 % and 0.45 % NaCl with KCl 20 mEq/L 125 mL/hr at 09/12/11 1610    Physical Exam: The patient appears to be in no distress.  Head and neck exam reveals that the pupils are equal and reactive.  The extraocular movements are full.  There is no scleral icterus.  Mouth and pharynx are benign.  No lymphadenopathy.  No carotid bruits.  The jugular venous pressure is normal.  Thyroid is not enlarged or tender.  Chest is clear to percussion and auscultation.  No rales or rhonchi.  Expansion of the chest is symmetrical.  Heart reveals no abnormal lift or heave.  First and second heart sounds are normal.  There is no murmur gallop rub or click.  The abdomen is tender in RUQ.   Bowel sounds are soft.  There is no hepatosplenomegaly or mass.  There are no abdominal bruits.  Extremities reveal no phlebitis or edema.  Pedal pulses are good.  There is no cyanosis or clubbing.  Neurologic exam is normal strength and no lateralizing weakness.  No sensory deficits.  Integument reveals no rash  Lab Results:  Basename 09/12/11 0500 09/11/11 0836  WBC 22.6* 15.3*  HGB 13.2 14.0  PLT 204 163    Basename 09/12/11 0500 09/11/11 0836  NA 135 131*  K 4.7 3.5  CL 95* 92*  CO2 26 26  GLUCOSE 144* 162*  BUN 28* 14  CREATININE 2.09* 0.94    Basename 09/11/11 0836  TROPONINI <0.30   Hepatic Function Panel  Basename 09/12/11 0500  PROT 7.6  ALBUMIN 3.4*  AST 31  ALT 23  ALKPHOS 171*  BILITOT 1.1  BILIDIR --  IBILI --   No results found for this basename: CHOL in the last 72 hours No results found for this basename: PROTIME in the last 72 hours  Imaging: Ct Abdomen Pelvis W Contrast  09/11/2011  *RADIOLOGY REPORT*  Clinical Data: Abdominal pain.  CT ABDOMEN AND PELVIS WITH CONTRAST  Technique:  Multidetector CT imaging of  the abdomen and pelvis was performed following the standard protocol during bolus administration of intravenous contrast.  Contrast: OMNIPAQUE IOHEXOL 300 MG/ML  SOLN  Comparison: Plain films 09/11/2011  Findings: There is a small pleural effusion and pericardial effusion.  Atelectasis noted in both lower lungs, left greater than right.  Changes of prior CABG.  Heart is borderline in size.  There is significant inflammation around the gallbladder with distention of the gallbladder.  Findings compatible with acute cholecystitis.  The fluid noted adjacent to the descending duodenum and right side of the colon, secondary from cholecystitis.  No visible stones by CT.  Small low-density areas within the liver, likely small cysts. Spleen, pancreas, adrenals and kidneys are unremarkable.  Mild gaseous distention throughout the colon, likely mild  ileus. Small bowel is decompressed.  Incidentally noted is a short segment small bowel intussusception in the left upper quadrant, likely a transit and and incidental finding.  Doubt of clinical significance.  IMPRESSION: Marked inflammatory change around the gallbladder which is distended.  Findings compatible with acute cholecystitis.  Suspect mild associated adynamic ileus of the colon.  Original Report Authenticated By: Cyndie Chime, M.D.   Dg Abd Acute W/chest  09/11/2011  *RADIOLOGY REPORT*  Clinical Data: Abdominal pain and distention.  ACUTE ABDOMEN SERIES (ABDOMEN 2 VIEW & CHEST 1 VIEW)  Comparison: 03/14/2013abdominal radiography.  08/09/2011 chest radiography.  Findings: There is a moderate amount of intestinal gas, primarily colonic.  There are air-fluid levels in the right colon.  Maximal diameter of the right colon is approximately 14 cm.  No dilated small bowel is seen.  There is less gaseous distention than was seen previously.  No free air.  No abnormal calcifications or bony findings.  One-view chest shows chronic linear scars in the lung bases.  There is slightly more atelectasis at the lung bases than was seen on 08/09/2011.  IMPRESSION: Gaseous distention of the intestine, primarily the colon, though less pronounced than was seen in Aug 05, 2022.  The pattern could relate to chronic pseudo-obstruction or ileus.  No free air.  Linear pulmonary scarring with slightly more atelectasis than seen previously.  Original Report Authenticated By: Thomasenia Sales, M.D.    Cardiac Studies:  Assessment/Plan:  Patient Active Hospital Problem List: Acute cholecystitis (09/11/2011)   Assessment: WBC higher this am   Plan: Surgery today CAD   Assesssment: Stable for surgery   Plan: have switched beta blocker to IV route.   LOS: 1 day    Cassell Clement 09/12/2011, 8:09 AM

## 2011-09-12 NOTE — Procedures (Signed)
Procedure:  Percutaneous cholecystostomy Findings:  Distended GB.  "Crank case oil" dark bile in lumen.  Sample sent for culture.  10 Fr pigtail drain placed to gravity bag drainage.

## 2011-09-12 NOTE — ED Notes (Signed)
MD informed of pt's BP.  Said we will attempt drain placement with local vs moderate sedation

## 2011-09-12 NOTE — H&P (Signed)
Dean Cole is an 61 y.o. male.   Chief Complaint: RLQ pain; cholecystitis per CT; was scheduled for poss OR today- unable to get in; all labs high; wbc 20K; request made for IR to place percutaneous cholecystostomy drain for now and plan OR later in week Scheduled for perc chole drain in IR Pt on Zosyn  HPI: abd pain; htn; CAD  Past Medical History  Diagnosis Date  . HTN (hypertension)   . Hypothyroidism   . Borderline diabetes   . Coronary artery disease   . Angina   . Shortness of breath   . Cancer     skin cancer  basel cell carcinoma  . Headache   . Restless leg syndrome     Past Surgical History  Procedure Date  . Cardiac catheterization 07/07/2011  . Coronary artery bypass graft 07/10/2011    Procedure: CORONARY ARTERY BYPASS GRAFTING (CABG);  Surgeon: Kerin Perna, MD;  Location: [facility] OR;  Service: Open Heart Surgery;  Laterality: N/A;  . Excisional hemorrhoidectomy     Family History  Problem Relation Age of Onset  . Coronary artery disease Father   . Coronary artery disease Paternal Grandfather    Social History:  reports that he quit smoking about 30 years ago. He has quit using smokeless tobacco. He reports that he does not drink alcohol or use illicit drugs.  Allergies: No Known Allergies  Medications Prior to Admission  Medication Sig Dispense Refill  . aspirin EC 325 MG EC tablet Take 1 tablet (325 mg total) by mouth daily.  30 tablet    . diazepam (VALIUM) 5 MG tablet Take 5 mg by mouth 4 (four) times daily. For nerves      . imipramine (TOFRANIL) 25 MG tablet Take 75 mg by mouth at bedtime.      Marland Kitchen levothyroxine (SYNTHROID, LEVOTHROID) 150 MCG tablet Take 150 mcg by mouth daily.      . metoprolol tartrate (LOPRESSOR) 25 MG tablet Take 25 mg by mouth 2 (two) times daily.      . polyethylene glycol (MIRALAX / GLYCOLAX) packet Take 17 g by mouth daily. As needed      . simvastatin (ZOCOR) 20 MG tablet Take 1 tablet (20 mg total) by mouth daily at 6 PM.   30 tablet  5    Results for orders placed during the hospital encounter of 09/11/11 (from the past 48 hour(s))  CBC     Status: Abnormal   Collection Time   09/11/11  8:36 AM      Component Value Range Comment   WBC 15.3 (*) 4.0 - 10.5 (K/uL)    RBC 4.99  4.22 - 5.81 (MIL/uL)    Hemoglobin 14.0  13.0 - 17.0 (g/dL)    HCT 40.9  81.1 - 91.4 (%)    MCV 84.2  78.0 - 100.0 (fL)    MCH 28.1  26.0 - 34.0 (pg)    MCHC 33.3  30.0 - 36.0 (g/dL)    RDW 78.2  95.6 - 21.3 (%)    Platelets 163  150 - 400 (K/uL)   DIFFERENTIAL     Status: Abnormal   Collection Time   09/11/11  8:36 AM      Component Value Range Comment   Neutrophils Relative 85 (*) 43 - 77 (%)    Neutro Abs 13.0 (*) 1.7 - 7.7 (K/uL)    Lymphocytes Relative 7 (*) 12 - 46 (%)    Lymphs Abs 1.0  0.7 -  4.0 (K/uL)    Monocytes Relative 8  3 - 12 (%)    Monocytes Absolute 1.2 (*) 0.1 - 1.0 (K/uL)    Eosinophils Relative 0  0 - 5 (%)    Eosinophils Absolute 0.0  0.0 - 0.7 (K/uL)    Basophils Relative 0  0 - 1 (%)    Basophils Absolute 0.0  0.0 - 0.1 (K/uL)   COMPREHENSIVE METABOLIC PANEL     Status: Abnormal   Collection Time   09/11/11  8:36 AM      Component Value Range Comment   Sodium 131 (*) 135 - 145 (mEq/L)    Potassium 3.5  3.5 - 5.1 (mEq/L)    Chloride 92 (*) 96 - 112 (mEq/L)    CO2 26  19 - 32 (mEq/L)    Glucose, Bld 162 (*) 70 - 99 (mg/dL)    BUN 14  6 - 23 (mg/dL)    Creatinine, Ser 4.78  0.50 - 1.35 (mg/dL)    Calcium 9.9  8.4 - 10.5 (mg/dL)    Total Protein 8.1  6.0 - 8.3 (g/dL)    Albumin 3.9  3.5 - 5.2 (g/dL)    AST 21  0 - 37 (U/L)    ALT 14  0 - 53 (U/L)    Alkaline Phosphatase 143 (*) 39 - 117 (U/L)    Total Bilirubin 0.6  0.3 - 1.2 (mg/dL)    GFR calc non Af Amer 89 (*) >90 (mL/min)    GFR calc Af Amer >90  >90 (mL/min)   LIPASE, BLOOD     Status: Normal   Collection Time   09/11/11  8:36 AM      Component Value Range Comment   Lipase 19  11 - 59 (U/L)   CARDIAC PANEL(CRET KIN+CKTOT+MB+TROPI)      Status: Normal   Collection Time   09/11/11  8:36 AM      Component Value Range Comment   Total CK 106  7 - 232 (U/L)    CK, MB 1.6  0.3 - 4.0 (ng/mL)    Troponin I <0.30  <0.30 (ng/mL)    Relative Index 1.5  0.0 - 2.5    URINALYSIS, ROUTINE W REFLEX MICROSCOPIC     Status: Abnormal   Collection Time   09/11/11 11:06 AM      Component Value Range Comment   Color, Urine AMBER (*) YELLOW  BIOCHEMICALS MAY BE AFFECTED BY COLOR   APPearance CLOUDY (*) CLEAR     Specific Gravity, Urine 1.028  1.005 - 1.030     pH 6.0  5.0 - 8.0     Glucose, UA 100 (*) NEGATIVE (mg/dL)    Hgb urine dipstick NEGATIVE  NEGATIVE     Bilirubin Urine SMALL (*) NEGATIVE     Ketones, ur 15 (*) NEGATIVE (mg/dL)    Protein, ur NEGATIVE  NEGATIVE (mg/dL)    Urobilinogen, UA 1.0  0.0 - 1.0 (mg/dL)    Nitrite NEGATIVE  NEGATIVE     Leukocytes, UA TRACE (*) NEGATIVE    URINE MICROSCOPIC-ADD ON     Status: Abnormal   Collection Time   09/11/11 11:06 AM      Component Value Range Comment   Squamous Epithelial / LPF RARE  RARE     WBC, UA 0-2  <3 (WBC/hpf)    RBC / HPF 0-2  <3 (RBC/hpf)    Bacteria, UA RARE  RARE     Casts HYALINE CASTS (*) NEGATIVE  Urine-Other AMORPHOUS URATES/PHOSPHATES     URINALYSIS, ROUTINE W REFLEX MICROSCOPIC     Status: Abnormal   Collection Time   09/11/11 11:35 PM      Component Value Range Comment   Color, Urine AMBER (*) YELLOW  BIOCHEMICALS MAY BE AFFECTED BY COLOR   APPearance CLEAR  CLEAR     Specific Gravity, Urine 1.010  1.005 - 1.030     pH 6.0  5.0 - 8.0     Glucose, UA NEGATIVE  NEGATIVE (mg/dL)    Hgb urine dipstick NEGATIVE  NEGATIVE     Bilirubin Urine MODERATE (*) NEGATIVE     Ketones, ur 15 (*) NEGATIVE (mg/dL)    Protein, ur 308 (*) NEGATIVE (mg/dL)    Urobilinogen, UA 1.0  0.0 - 1.0 (mg/dL)    Nitrite POSITIVE (*) NEGATIVE     Leukocytes, UA SMALL (*) NEGATIVE    URINE MICROSCOPIC-ADD ON     Status: Abnormal   Collection Time   09/11/11 11:35 PM      Component  Value Range Comment   Squamous Epithelial / LPF RARE  RARE     WBC, UA 0-2  <3 (WBC/hpf)    RBC / HPF 0-2  <3 (RBC/hpf)    Bacteria, UA MANY (*) RARE     Sperm, UA PRESENT     COMPREHENSIVE METABOLIC PANEL     Status: Abnormal   Collection Time   09/12/11  5:00 AM      Component Value Range Comment   Sodium 135  135 - 145 (mEq/L)    Potassium 4.7  3.5 - 5.1 (mEq/L)    Chloride 95 (*) 96 - 112 (mEq/L)    CO2 26  19 - 32 (mEq/L)    Glucose, Bld 144 (*) 70 - 99 (mg/dL)    BUN 28 (*) 6 - 23 (mg/dL)    Creatinine, Ser 6.57 (*) 0.50 - 1.35 (mg/dL) DELTA CHECK NOTED   Calcium 9.2  8.4 - 10.5 (mg/dL)    Total Protein 7.6  6.0 - 8.3 (g/dL)    Albumin 3.4 (*) 3.5 - 5.2 (g/dL)    AST 31  0 - 37 (U/L)    ALT 23  0 - 53 (U/L)    Alkaline Phosphatase 171 (*) 39 - 117 (U/L)    Total Bilirubin 1.1  0.3 - 1.2 (mg/dL)    GFR calc non Af Amer 33 (*) >90 (mL/min)    GFR calc Af Amer 38 (*) >90 (mL/min)   CBC     Status: Abnormal   Collection Time   09/12/11  5:00 AM      Component Value Range Comment   WBC 22.6 (*) 4.0 - 10.5 (K/uL)    RBC 4.79  4.22 - 5.81 (MIL/uL)    Hemoglobin 13.2  13.0 - 17.0 (g/dL)    HCT 84.6  96.2 - 95.2 (%)    MCV 85.2  78.0 - 100.0 (fL)    MCH 27.6  26.0 - 34.0 (pg)    MCHC 32.4  30.0 - 36.0 (g/dL)    RDW 84.1  32.4 - 40.1 (%)    Platelets 204  150 - 400 (K/uL)   PROTIME-INR     Status: Abnormal   Collection Time   09/12/11  5:00 AM      Component Value Range Comment   Prothrombin Time 18.0 (*) 11.6 - 15.2 (seconds)    INR 1.46  0.00 - 1.49     Ct Abdomen Pelvis W  Contrast  09/11/2011  *RADIOLOGY REPORT*  Clinical Data: Abdominal pain.  CT ABDOMEN AND PELVIS WITH CONTRAST  Technique:  Multidetector CT imaging of the abdomen and pelvis was performed following the standard protocol during bolus administration of intravenous contrast.  Contrast: OMNIPAQUE IOHEXOL 300 MG/ML  SOLN  Comparison: Plain films 09/11/2011  Findings: There is a small pleural effusion and  pericardial effusion.  Atelectasis noted in both lower lungs, left greater than right.  Changes of prior CABG.  Heart is borderline in size.  There is significant inflammation around the gallbladder with distention of the gallbladder.  Findings compatible with acute cholecystitis.  The fluid noted adjacent to the descending duodenum and right side of the colon, secondary from cholecystitis.  No visible stones by CT.  Small low-density areas within the liver, likely small cysts. Spleen, pancreas, adrenals and kidneys are unremarkable.  Mild gaseous distention throughout the colon, likely mild ileus. Small bowel is decompressed.  Incidentally noted is a short segment small bowel intussusception in the left upper quadrant, likely a transit and and incidental finding.  Doubt of clinical significance.  IMPRESSION: Marked inflammatory change around the gallbladder which is distended.  Findings compatible with acute cholecystitis.  Suspect mild associated adynamic ileus of the colon.  Original Report Authenticated By: Cyndie Chime, M.D.   Dg Abd Acute W/chest  09/11/2011  *RADIOLOGY REPORT*  Clinical Data: Abdominal pain and distention.  ACUTE ABDOMEN SERIES (ABDOMEN 2 VIEW & CHEST 1 VIEW)  Comparison: 03/14/2013abdominal radiography.  08/09/2011 chest radiography.  Findings: There is a moderate amount of intestinal gas, primarily colonic.  There are air-fluid levels in the right colon.  Maximal diameter of the right colon is approximately 14 cm.  No dilated small bowel is seen.  There is less gaseous distention than was seen previously.  No free air.  No abnormal calcifications or bony findings.  One-view chest shows chronic linear scars in the lung bases.  There is slightly more atelectasis at the lung bases than was seen on 08/09/2011.  IMPRESSION: Gaseous distention of the intestine, primarily the colon, though less pronounced than was seen in Aug 13, 2022.  The pattern could relate to chronic pseudo-obstruction or  ileus.  No free air.  Linear pulmonary scarring with slightly more atelectasis than seen previously.  Original Report Authenticated By: Thomasenia Sales, M.D.    Review of Systems  Constitutional: Negative for fever.  Respiratory: Positive for shortness of breath.   Cardiovascular: Negative for chest pain.  Gastrointestinal: Positive for nausea and abdominal pain. Negative for vomiting.  Neurological: Positive for headaches.    Blood pressure 102/68, pulse 113, temperature 98.6 F (37 C), temperature source Oral, resp. rate 18, SpO2 97.00%. Physical Exam  Constitutional: He is oriented to person, place, and time. He appears well-developed and well-nourished.  Cardiovascular: Normal rate, regular rhythm and normal heart sounds.   No murmur heard. Respiratory: Effort normal and breath sounds normal. He has no wheezes.  GI: Soft. Bowel sounds are normal. There is tenderness.       Deep palpation RLQ  Musculoskeletal: Normal range of motion.  Neurological: He is alert and oriented to person, place, and time.  Skin: Skin is warm.  Psychiatric: He has a normal mood and affect. His behavior is normal. Judgment and thought content normal.     Assessment/Plan Lower abd pain; RLQ CT shows cholecystitis Wbc 20K Scheduled now for percutaneous cholecystostomy in IR for prob OR later in week Pt on Zosyn Pt and family aware  of procedure benefits and risks and agreeable to proceed. Consent in chart  Yeudiel Mateo A 09/12/2011, 9:32 AM

## 2011-09-12 NOTE — Care Management Note (Unsigned)
    Page 1 of 1   09/12/2011     9:45:52 AM   CARE MANAGEMENT NOTE 09/12/2011  Patient:  Dean Cole, Dean Cole   Account Number:  [id number]  Date Initiated:  09/12/2011  Documentation initiated by:  GRAVES-BIGELOW,Avree Szczygiel  Subjective/Objective Assessment:   Pt admitted with abdomianal pain. CT revealed  an adynamic ileus with findings of acute cholecystitis. Plan for surgery today.     Action/Plan:   Anticipated DC Date:  09/17/2011   Anticipated DC Plan:  HOME/SELF CARE      DC Planning Services  CM consult      Choice offered to / List presented to:             Status of service:  In process, will continue to follow Medicare Important Message given?   (If response is "NO", the following Medicare IM given date fields will be blank) Date Medicare IM given:   Date Additional Medicare IM given:    Discharge Disposition:    Per UR Regulation:    If discussed at Long Length of Stay Meetings, dates discussed:    Comments:  09-12-11 0944 Tomi Bamberger, RN,BSN [phone] CM will continue to monitor for disposition needs.

## 2011-09-12 NOTE — Progress Notes (Signed)
UR Completed Jakelin Taussig Graves-Bigelow, RN,BSN [phone]  

## 2011-09-12 NOTE — H&P (Signed)
Agree.  For perc choly tube placement later today at request of General Surgery for acute cholecystitis.

## 2011-09-12 NOTE — Progress Notes (Signed)
Paged MD on-call Donell Beers) for pre-op orders. No order for consent yet. MD said they would be by this morning to obtain consent.  Harless Litten, RN 09/11/11

## 2011-09-12 NOTE — Progress Notes (Addendum)
Patient is to have surgery today.  BUN and creatinine have doubled, and WBC > 20K.  Will bolus with saline. Will possible need open cholecystectomy today.  Marta Lamas. Gae Bon, MD, FACS [phone] [facility]  ADDENDUM: Due to an emergency that will bump him from OR schedule today and worsening of his labs, we will see if IR can do a perc drain of the gallbladder today.  We will then plan on surgery for lap chole later this week.  Hopefully this will make the patient feel better and also allow his gb to cool down and make his surgery a little less difficult.  Sheli Dorin E 9:05 AM 09/12/2011

## 2011-09-12 NOTE — Clinical Documentation Improvement (Signed)
GENERIC DOCUMENTATION CLARIFICATION QUERY  THIS DOCUMENT IS NOT A PERMANENT PART OF THE MEDICAL RECORD  TO RESPOND TO THE THIS QUERY, FOLLOW THE INSTRUCTIONS BELOW:  1. If needed, update documentation for the patient's encounter via the notes activity.  2. Access this query again and click edit on the In [employer].  3. After updating, or not, click F2 to complete all highlighted (required) fields concerning your review. Select "additional documentation in the medical record" OR "no additional documentation provided".  4. Click Sign note button.  5. The deficiency will fall out of your In Basket *Please let us know if you are not able to complete this workflow by phone or e-mail (listed below).  Please update your documentation within the medical record to reflect your response to this query.                                                                                        09/12/11   Dear [facility],  In a better effort to capture your patient's severity of illness, reflect appropriate length of stay and utilization of resources, a review of the patient medical record has revealed the following indicators.  PLEASE CLARIFY IN NOTES AND DC SUMMARY: ABNORMAL XRAY FINDINGS. THANK YOU FOR YOUR RESPONSE.  Possible Clinical Conditions? - Ileus - colonic ilues - Other Condition (please specify) - Cannot Clinically Determine  Supporting Information: - Risk Factors: Acute cholecystitis - Signs & Symptoms: - Diagnostics: XR: "Mild gaseous distention throughout the colon, likely mild ileus. Suspect mild associated adynamic ileus of the colon."  - Treatment: NS IV bolus then D5 1/2NS w/KCL @ 127ml/hr,, NPO except chips and sips w/meds, 5/14 IR placed perc choly tube, Gen Surg consult    You may use possible, probable, or suspect with inpatient documentation. possible, probable, suspected diagnoses MUST be documented at the time of discharge  Reviewed:  additional documentation in the medical record  Thank You,  Beverley Fiedler RN Clinical Documentation Specialist: [phone] Health Information Management [place]

## 2011-09-13 LAB — CBC
HCT: 32.9 % — ABNORMAL LOW (ref 39.0–52.0)
Hemoglobin: 10.7 g/dL — ABNORMAL LOW (ref 13.0–17.0)
MCV: 85.2 fL (ref 78.0–100.0)
RDW: 13.6 % (ref 11.5–15.5)
WBC: 14.2 10*3/uL — ABNORMAL HIGH (ref 4.0–10.5)

## 2011-09-13 LAB — BASIC METABOLIC PANEL
BUN: 36 mg/dL — ABNORMAL HIGH (ref 6–23)
CO2: 26 mEq/L (ref 19–32)
Chloride: 95 mEq/L — ABNORMAL LOW (ref 96–112)
Creatinine, Ser: 1.53 mg/dL — ABNORMAL HIGH (ref 0.50–1.35)
GFR calc Af Amer: 55 mL/min — ABNORMAL LOW (ref >90)
Glucose, Bld: 95 mg/dL (ref 70–99)

## 2011-09-13 NOTE — Progress Notes (Signed)
Subjective:  Has rightsided chest pain with deep breathing related to his cholecystitis.  No angina.  RRhythm is sinus tachy.  Objective:  Vital Signs in the last 24 hours: Temp:  [97.9 F (36.6 C)-98.9 F (37.2 C)] 97.9 F (36.6 C) (05/15 0800) Pulse Rate:  [99-115] 102  (05/15 0800) Resp:  [15-26] 18  (05/15 0800) BP: (95-116)/(56-78) 116/78 mmHg (05/15 0800) SpO2:  [94 %-99 %] 98 % (05/15 0800)  Intake/Output from previous day: 05/14 0701 - 05/15 0700 In: 1600 [I.V.:1500; IV Piggyback:100] Out: 900 [Urine:450; Drains:450] Intake/Output from this shift: Total I/O In: -  Out: 250 [Urine:250]     . diazepam  5 mg Oral QID  . imipramine  75 mg Oral QHS  . levothyroxine  150 mcg Oral Daily  . metoprolol  5 mg Intravenous Q6H  . pantoprazole (PROTONIX) IV  40 mg Intravenous QHS  . piperacillin-tazobactam (ZOSYN)  IV  3.375 g Intravenous Q8H  . sodium chloride  500 mL Intravenous Once  . DISCONTD: aspirin EC  325 mg Oral Daily      . dextrose 5 % and 0.45 % NaCl with KCl 20 mEq/L 125 mL/hr at 09/12/11 0981    Physical Exam: The patient appears to be in no distress.  Head and neck exam reveals that the pupils are equal and reactive.  The extraocular movements are full.  There is no scleral icterus.  Mouth and pharynx are benign.  No lymphadenopathy.  No carotid bruits.  The jugular venous pressure is normal.  Thyroid is not enlarged or tender.  Chest is clear to percussion and auscultation. Decreased breath sounds right base.  No rales or rhonchi.  Expansion of the chest is symmetrical.  Heart reveals no abnormal lift or heave.  First and second heart sounds are normal.  There is no murmur gallop rub or click.  The abdomen is tender in RUQ.  Bowel sounds are soft.  There is no hepatosplenomegaly or mass.  There are no abdominal bruits.  Extremities reveal no phlebitis or edema.  Pedal pulses are good.  There is no cyanosis or clubbing.  Neurologic exam is normal  strength and no lateralizing weakness.  No sensory deficits.  Integument reveals no rash  Lab Results:  Basename 09/12/11 0500 09/11/11 0836  WBC 22.6* 15.3*  HGB 13.2 14.0  PLT 204 163    Basename 09/12/11 0500 09/11/11 0836  NA 135 131*  K 4.7 3.5  CL 95* 92*  CO2 26 26  GLUCOSE 144* 162*  BUN 28* 14  CREATININE 2.09* 0.94    Basename 09/11/11 0836  TROPONINI <0.30   Hepatic Function Panel  Basename 09/12/11 0500  PROT 7.6  ALBUMIN 3.4*  AST 31  ALT 23  ALKPHOS 171*  BILITOT 1.1  BILIDIR --  IBILI --   No results found for this basename: CHOL in the last 72 hours No results found for this basename: PROTIME in the last 72 hours  Imaging: Ct Abdomen Pelvis W Contrast  09/11/2011  *RADIOLOGY REPORT*  Clinical Data: Abdominal pain.  CT ABDOMEN AND PELVIS WITH CONTRAST  Technique:  Multidetector CT imaging of the abdomen and pelvis was performed following the standard protocol during bolus administration of intravenous contrast.  Contrast: OMNIPAQUE IOHEXOL 300 MG/ML  SOLN  Comparison: Plain films 09/11/2011  Findings: There is a small pleural effusion and pericardial effusion.  Atelectasis noted in both lower lungs, left greater than right.  Changes of prior CABG.  Heart is  borderline in size.  There is significant inflammation around the gallbladder with distention of the gallbladder.  Findings compatible with acute cholecystitis.  The fluid noted adjacent to the descending duodenum and right side of the colon, secondary from cholecystitis.  No visible stones by CT.  Small low-density areas within the liver, likely small cysts. Spleen, pancreas, adrenals and kidneys are unremarkable.  Mild gaseous distention throughout the colon, likely mild ileus. Small bowel is decompressed.  Incidentally noted is a short segment small bowel intussusception in the left upper quadrant, likely a transit and and incidental finding.  Doubt of clinical significance.  IMPRESSION: Marked  inflammatory change around the gallbladder which is distended.  Findings compatible with acute cholecystitis.  Suspect mild associated adynamic ileus of the colon.  Original Report Authenticated By: Cyndie Chime, M.D.   Ir Perc Cholecystostomy  09/12/2011  *RADIOLOGY REPORT*  Clinical Data:  Acute cholecystitis.  The patient requires percutaneous cholecystostomy prior to planned cholecystectomy.  PERCUTANEOUS CHOLECYSTOSTOMY  Comparison:  CT of the abdomen dated 09/11/2011  Contrast:  10 ml Omnipaque 300  Medications:  The patient received a scheduled dose of 3.375 grams of IV Zosyn as the procedure was begun and prior to skin puncture.  Fluoroscopy Time: 0.2 minutes.  Procedure:  The procedure, risks, benefits, and alternatives were explained to the patient.  Questions regarding the procedure were encouraged and answered.  The patient understands and consents to the procedure.  The right abdominal wall was prepped with Betadine in a sterile fashion, and a sterile drape was applied covering the operative field.  A sterile gown and sterile gloves were used for the procedure. Local anesthesia was provided with 1% Lidocaine. Ultrasound image documentation was performed.  Fluoroscopy during the procedure and fluoro spot radiograph confirms appropriate catheter position.  Ultrasound was utilized to localize the gallbladder.  Under direct ultrasound guidance, a 19 gauge needle was advanced via a transhepatic approach into the gallbladder lumen.  Aspiration was performed and a bile sample sent for culture studies.  A small amount of diluted contrast material was injected.  A guide wire was then advanced into the gallbladder.  Percutaneous tract dilatation was then performed over a guide wire to 10-French.  A 10-French pigtail drainage catheter was then advanced into the gallbladder lumen under fluoroscopy.  Catheter was formed and injected with contrast material to confirm position. The catheter was flushed and  connected to a gravity drainage bag. It was secured at the skin with a Prolene retention suture and Stat- Lock device.  Complications:  None  Findings:  After needle puncture of the gallbladder, a bile sample was aspirated and sent for culture.  Bile sample was very dark in color and grossly non purulent.  The cholecystostomy tube was advanced into the gallbladder lumen and formed.  It is now draining bile.  This tube will be left to gravity drainage.  IMPRESSION: Percutaneous cholecystostomy with placement of 10-French drainage catheter into the gallbladder lumen.  This was left to gravity drainage.  Original Report Authenticated By: Reola Calkins, M.D.   Dg Abd Acute W/chest  09/11/2011  *RADIOLOGY REPORT*  Clinical Data: Abdominal pain and distention.  ACUTE ABDOMEN SERIES (ABDOMEN 2 VIEW & CHEST 1 VIEW)  Comparison: 03/14/2013abdominal radiography.  08/09/2011 chest radiography.  Findings: There is a moderate amount of intestinal gas, primarily colonic.  There are air-fluid levels in the right colon.  Maximal diameter of the right colon is approximately 14 cm.  No dilated small bowel is seen.  There is less gaseous distention than was seen previously.  No free air.  No abnormal calcifications or bony findings.  One-view chest shows chronic linear scars in the lung bases.  There is slightly more atelectasis at the lung bases than was seen on 08/09/2011.  IMPRESSION: Gaseous distention of the intestine, primarily the colon, though less pronounced than was seen in 2022-08-09.  The pattern could relate to chronic pseudo-obstruction or ileus.  No free air.  Linear pulmonary scarring with slightly more atelectasis than seen previously.  Original Report Authenticated By: Thomasenia Sales, M.D.    Cardiac Studies:  Assessment/Plan:  Patient Active Hospital Problem List: Acute cholecystitis (09/11/2011)   Assessment: Cholecystostomy tube draining.   Plan: Surgery soon. CAD   Assesssment: Stable for surgery    Plan: have switched beta blocker to IV route. Renal insufficiency   Assessment: Creatinine higher yesterday.   Plan: Recheck BMET and CBC this am.   LOS: 2 days    Cassell Clement 09/13/2011, 8:21 AM

## 2011-09-13 NOTE — Progress Notes (Signed)
Subjective: Percutaneous cholecystostomy drain placed 5/14 Pt feels better; less painful Surgery soon?   Objective: Vital signs in last 24 hours: Temp:  [98.3 F (36.8 C)-98.9 F (37.2 C)] 98.3 F (36.8 C) (05/15 0500) Pulse Rate:  [99-115] 103  (05/15 0500) Resp:  [15-26] 20  (05/15 0500) BP: (95-115)/(56-72) 108/72 mmHg (05/15 0500) SpO2:  [94 %-99 %] 95 % (05/15 0500) Last BM Date: 09/13/11  Intake/Output from previous day: 05/14 0701 - 05/15 0700 In: 1600 [I.V.:1500; IV Piggyback:100] Out: 900 [Urine:450; Drains:450] Intake/Output this shift: Total I/O In: -  Out: 250 [Urine:250]  PE:  Afeb; vss Output 450cc 5/14; 50 cc in bag now- bile Site clean and dry NT No new labs today  Lab Results:   Basename 09/12/11 0500 09/11/11 0836  WBC 22.6* 15.3*  HGB 13.2 14.0  HCT 40.8 42.0  PLT 204 163   BMET  Basename 09/12/11 0500 09/11/11 0836  NA 135 131*  K 4.7 3.5  CL 95* 92*  CO2 26 26  GLUCOSE 144* 162*  BUN 28* 14  CREATININE 2.09* 0.94  CALCIUM 9.2 9.9   PT/INR  Basename 09/12/11 0500  LABPROT 18.0*  INR 1.46   ABG No results found for this basename: PHART:2,PCO2:2,PO2:2,HCO3:2 in the last 72 hours  Studies/Results: Ct Abdomen Pelvis W Contrast  09/11/2011  *RADIOLOGY REPORT*  Clinical Data: Abdominal pain.  CT ABDOMEN AND PELVIS WITH CONTRAST  Technique:  Multidetector CT imaging of the abdomen and pelvis was performed following the standard protocol during bolus administration of intravenous contrast.  Contrast: OMNIPAQUE IOHEXOL 300 MG/ML  SOLN  Comparison: Plain films 09/11/2011  Findings: There is a small pleural effusion and pericardial effusion.  Atelectasis noted in both lower lungs, left greater than right.  Changes of prior CABG.  Heart is borderline in size.  There is significant inflammation around the gallbladder with distention of the gallbladder.  Findings compatible with acute cholecystitis.  The fluid noted adjacent to the  descending duodenum and right side of the colon, secondary from cholecystitis.  No visible stones by CT.  Small low-density areas within the liver, likely small cysts. Spleen, pancreas, adrenals and kidneys are unremarkable.  Mild gaseous distention throughout the colon, likely mild ileus. Small bowel is decompressed.  Incidentally noted is a short segment small bowel intussusception in the left upper quadrant, likely a transit and and incidental finding.  Doubt of clinical significance.  IMPRESSION: Marked inflammatory change around the gallbladder which is distended.  Findings compatible with acute cholecystitis.  Suspect mild associated adynamic ileus of the colon.  Original Report Authenticated By: Cyndie Chime, M.D.   Ir Perc Cholecystostomy  09/12/2011  *RADIOLOGY REPORT*  Clinical Data:  Acute cholecystitis.  The patient requires percutaneous cholecystostomy prior to planned cholecystectomy.  PERCUTANEOUS CHOLECYSTOSTOMY  Comparison:  CT of the abdomen dated 09/11/2011  Contrast:  10 ml Omnipaque 300  Medications:  The patient received a scheduled dose of 3.375 grams of IV Zosyn as the procedure was begun and prior to skin puncture.  Fluoroscopy Time: 0.2 minutes.  Procedure:  The procedure, risks, benefits, and alternatives were explained to the patient.  Questions regarding the procedure were encouraged and answered.  The patient understands and consents to the procedure.  The right abdominal wall was prepped with Betadine in a sterile fashion, and a sterile drape was applied covering the operative field.  A sterile gown and sterile gloves were used for the procedure. Local anesthesia was provided with 1% Lidocaine. Ultrasound  image documentation was performed.  Fluoroscopy during the procedure and fluoro spot radiograph confirms appropriate catheter position.  Ultrasound was utilized to localize the gallbladder.  Under direct ultrasound guidance, a 19 gauge needle was advanced via a transhepatic  approach into the gallbladder lumen.  Aspiration was performed and a bile sample sent for culture studies.  A small amount of diluted contrast material was injected.  A guide wire was then advanced into the gallbladder.  Percutaneous tract dilatation was then performed over a guide wire to 10-French.  A 10-French pigtail drainage catheter was then advanced into the gallbladder lumen under fluoroscopy.  Catheter was formed and injected with contrast material to confirm position. The catheter was flushed and connected to a gravity drainage bag. It was secured at the skin with a Prolene retention suture and Stat- Lock device.  Complications:  None  Findings:  After needle puncture of the gallbladder, a bile sample was aspirated and sent for culture.  Bile sample was very dark in color and grossly non purulent.  The cholecystostomy tube was advanced into the gallbladder lumen and formed.  It is now draining bile.  This tube will be left to gravity drainage.  IMPRESSION: Percutaneous cholecystostomy with placement of 10-French drainage catheter into the gallbladder lumen.  This was left to gravity drainage.  Original Report Authenticated By: Reola Calkins, M.D.   Dg Abd Acute W/chest  09/11/2011  *RADIOLOGY REPORT*  Clinical Data: Abdominal pain and distention.  ACUTE ABDOMEN SERIES (ABDOMEN 2 VIEW & CHEST 1 VIEW)  Comparison: 03/14/2013abdominal radiography.  08/09/2011 chest radiography.  Findings: There is a moderate amount of intestinal gas, primarily colonic.  There are air-fluid levels in the right colon.  Maximal diameter of the right colon is approximately 14 cm.  No dilated small bowel is seen.  There is less gaseous distention than was seen previously.  No free air.  No abnormal calcifications or bony findings.  One-view chest shows chronic linear scars in the lung bases.  There is slightly more atelectasis at the lung bases than was seen on 08/09/2011.  IMPRESSION: Gaseous distention of the intestine,  primarily the colon, though less pronounced than was seen in 08-13-22.  The pattern could relate to chronic pseudo-obstruction or ileus.  No free air.  Linear pulmonary scarring with slightly more atelectasis than seen previously.  Original Report Authenticated By: Thomasenia Sales, M.D.    Anti-infectives: Anti-infectives     Start     Dose/Rate Route Frequency Ordered Stop   09/11/11 1800  piperacillin-tazobactam (ZOSYN) IVPB 3.375 g       3.375 g 12.5 mL/hr over 240 Minutes Intravenous 3 times per day 09/11/11 1629     09/11/11 1500   piperacillin-tazobactam (ZOSYN) IVPB 3.375 g  Status:  Discontinued        3.375 g 100 mL/hr over 30 Minutes Intra[facility]ous [facility] 09/11/11 1445 09/11/11 1639          Assessment/Plan: s/p Procedure(s) (LRB): LAPAROSCOPIC CHOLECYSTECTOMY WITH INTRAOPERATIVE CHOLANGIOGRAM (N/A)  Percutaneous chole drain placed 5/14 Draining well Feels better OR soon; plan per [facility]   Luken Shadowens A 09/13/2011

## 2011-09-13 NOTE — Progress Notes (Signed)
[facility] Progress Note    Subjective: Patient is doing better, but still has a lot of pain  Objective: Vital signs in last 24 hours: Temp:  [97.9 F (36.6 C)-98.9 F (37.2 C)] 97.9 F (36.6 C) (05/15 0800) Pulse Rate:  [99-115] 102  (05/15 0800) Resp:  [15-26] 18  (05/15 0800) BP: (95-116)/(56-78) 116/78 mmHg (05/15 0800) SpO2:  [94 %-99 %] 98 % (05/15 0800) Last BM Date: 09/13/11  Intake/Output from previous day: 05/14 0701 - 05/15 0700 In: 1600 [I.V.:1500; IV Piggyback:100] Out: 900 [Urine:450; Drains:450] Intake/Output this shift: Total I/O In: -  Out: 250 [Urine:250]  General: No acute distress.  Blood being drawn  Lungs: Clear to auscultation  Abd: Distended, good bowel sounds  Extremities: No DVT signs or symptoms  Neuro: Intact  Lab Results:   BMET  Basename 09/12/11 0500 09/11/11 0836  NA 135 131*  K 4.7 3.5  CL 95* 92*  CO2 26 26  GLUCOSE 144* 162*  BUN 28* 14  CREATININE 2.09* 0.94  CALCIUM 9.2 9.9   PT/INR  Basename 09/12/11 0500  LABPROT 18.0*  INR 1.46   ABG No results found for this basename: PHART:2,PCO2:2,PO2:2,HCO3:2 in the last 72 hours  Studies/Results: Ct Abdomen Pelvis W Contrast  09/11/2011  *RADIOLOGY REPORT*  Clinical Data: Abdominal pain.  CT ABDOMEN AND PELVIS WITH CONTRAST  Technique:  Multidetector CT imaging of the abdomen and pelvis was performed following the standard protocol during bolus administration of intravenous contrast.  Contrast: OMNIPAQUE IOHEXOL 300 MG/ML  SOLN  Comparison: Plain films 09/11/2011  Findings: There is a small pleural effusion and pericardial effusion.  Atelectasis noted in both lower lungs, left greater than right.  Changes of prior CABG.  Heart is borderline in size.  There is significant inflammation around the gallbladder with distention of the gallbladder.  Findings compatible with acute cholecystitis.  The fluid noted adjacent to the descending duodenum and right side of the colon,  secondary from cholecystitis.  No visible stones by CT.  Small low-density areas within the liver, likely small cysts. Spleen, pancreas, adrenals and kidneys are unremarkable.  Mild gaseous distention throughout the colon, likely mild ileus. Small bowel is decompressed.  Incidentally noted is a short segment small bowel intussusception in the left upper quadrant, likely a transit and and incidental finding.  Doubt of clinical significance.  IMPRESSION: Marked inflammatory change around the gallbladder which is distended.  Findings compatible with acute cholecystitis.  Suspect mild associated adynamic ileus of the colon.  Original Report Authenticated By: Cyndie Chime, M.D.   Ir Perc Cholecystostomy  09/12/2011  *RADIOLOGY REPORT*  Clinical Data:  Acute cholecystitis.  The patient requires percutaneous cholecystostomy prior to planned cholecystectomy.  PERCUTANEOUS CHOLECYSTOSTOMY  Comparison:  CT of the abdomen dated 09/11/2011  Contrast:  10 ml Omnipaque 300  Medications:  The patient received a scheduled dose of 3.375 grams of IV Zosyn as the procedure was begun and prior to skin puncture.  Fluoroscopy Time: 0.2 minutes.  Procedure:  The procedure, risks, benefits, and alternatives were explained to the patient.  Questions regarding the procedure were encouraged and answered.  The patient understands and consents to the procedure.  The right abdominal wall was prepped with Betadine in a sterile fashion, and a sterile drape was applied covering the operative field.  A sterile gown and sterile gloves were used for the procedure. Local anesthesia was provided with 1% Lidocaine. Ultrasound image documentation was performed.  Fluoroscopy during the procedure and fluoro spot  radiograph confirms appropriate catheter position.  Ultrasound was utilized to localize the gallbladder.  Under direct ultrasound guidance, a 19 gauge needle was advanced via a transhepatic approach into the gallbladder lumen.  Aspiration was  performed and a bile sample sent for culture studies.  A small amount of diluted contrast material was injected.  A guide wire was then advanced into the gallbladder.  Percutaneous tract dilatation was then performed over a guide wire to 10-French.  A 10-French pigtail drainage catheter was then advanced into the gallbladder lumen under fluoroscopy.  Catheter was formed and injected with contrast material to confirm position. The catheter was flushed and connected to a gravity drainage bag. It was secured at the skin with a Prolene retention suture and Stat- Lock device.  Complications:  None  Findings:  After needle puncture of the gallbladder, a bile sample was aspirated and sent for culture.  Bile sample was very dark in color and grossly non purulent.  The cholecystostomy tube was advanced into the gallbladder lumen and formed.  It is now draining bile.  This tube will be left to gravity drainage.  IMPRESSION: Percutaneous cholecystostomy with placement of 10-French drainage catheter into the gallbladder lumen.  This was left to gravity drainage.  Original Report Authenticated By: Reola Calkins, M.D.   Dg Abd Acute W/chest  09/11/2011  *RADIOLOGY REPORT*  Clinical Data: Abdominal pain and distention.  ACUTE ABDOMEN SERIES (ABDOMEN 2 VIEW & CHEST 1 VIEW)  Comparison: 03/14/2013abdominal radiography.  08/09/2011 chest radiography.  Findings: There is a moderate amount of intestinal gas, primarily colonic.  There are air-fluid levels in the right colon.  Maximal diameter of the right colon is approximately 14 cm.  No dilated small bowel is seen.  There is less gaseous distention than was seen previously.  No free air.  No abnormal calcifications or bony findings.  One-view chest shows chronic linear scars in the lung bases.  There is slightly more atelectasis at the lung bases than was seen on 08/09/2011.  IMPRESSION: Gaseous distention of the intestine, primarily the colon, though less pronounced than was  seen in 08-17-2022.  The pattern could relate to chronic pseudo-obstruction or ileus.  No free air.  Linear pulmonary scarring with slightly more atelectasis than seen previously.  Original Report Authenticated By: Thomasenia Sales, M.D.    Anti-infectives: Anti-infectives     Start     Dose/Rate Route Frequency Ordered Stop   09/11/11 1800  piperacillin-tazobactam (ZOSYN) IVPB 3.375 g       3.375 g 12.5 mL/hr over 240 Minutes Intravenous 3 times per day 09/11/11 1629     09/11/11 1500   piperacillin-tazobactam (ZOSYN) IVPB 3.375 g  Status:  Discontinued        3.375 g 100 mL/hr over 30 Minutes [facility] 09/11/11 1445 09/11/11 1639          Assessment/Plan: s/p Procedure(s): LAPAROSCOPIC CHOLECYSTECTOMY WITH INTRAOPERATIVE CHOLANGIOGRAM planned for tomorrow, possible open procedure  Will allow clear liquids until MN.  LOS: 2 days   Marta Lamas. Gae Bon, MD, FACS [phone] [facility] Surgery 09/13/2011

## 2011-09-14 ENCOUNTER — Encounter (HOSPITAL_COMMUNITY): Payer: Self-pay | Admitting: Anesthesiology

## 2011-09-14 ENCOUNTER — Inpatient Hospital Stay (HOSPITAL_COMMUNITY): Payer: Medicare Other | Admitting: Anesthesiology

## 2011-09-14 ENCOUNTER — Encounter (HOSPITAL_COMMUNITY): Admission: EM | Disposition: A | Discharge status: Home / Self Care | Payer: Self-pay | Source: Home / Self Care

## 2011-09-14 HISTORY — PX: CHOLECYSTECTOMY: SHX55

## 2011-09-14 LAB — COMPREHENSIVE METABOLIC PANEL
ALT: 13 U/L (ref 0–53)
Alkaline Phosphatase: 133 U/L — ABNORMAL HIGH (ref 39–117)
BUN: 18 mg/dL (ref 6–23)
CO2: 29 mEq/L (ref 19–32)
Chloride: 96 mEq/L (ref 96–112)
GFR calc Af Amer: 82 mL/min — ABNORMAL LOW (ref >90)
GFR calc non Af Amer: 70 mL/min — ABNORMAL LOW (ref >90)
Glucose, Bld: 121 mg/dL — ABNORMAL HIGH (ref 70–99)
Potassium: 4 mEq/L (ref 3.5–5.1)
Sodium: 135 mEq/L (ref 135–145)
Total Bilirubin: 0.4 mg/dL (ref 0.3–1.2)

## 2011-09-14 LAB — DIFFERENTIAL
Basophils Absolute: 0 10*3/uL (ref 0.0–0.1)
Basophils Relative: 0 % (ref 0–1)
Lymphocytes Relative: 17 % (ref 12–46)
Monocytes Absolute: 0.4 10*3/uL (ref 0.1–1.0)
Neutro Abs: 5.9 10*3/uL (ref 1.7–7.7)
Neutrophils Relative %: 73 % (ref 43–77)

## 2011-09-14 LAB — CBC
HCT: 32.5 % — ABNORMAL LOW (ref 39.0–52.0)
MCHC: 31.4 g/dL (ref 30.0–36.0)
Platelets: 179 10*3/uL (ref 150–400)
RDW: 13.5 % (ref 11.5–15.5)
WBC: 8 10*3/uL (ref 4.0–10.5)

## 2011-09-14 SURGERY — LAPAROSCOPIC CHOLECYSTECTOMY WITH INTRAOPERATIVE CHOLANGIOGRAM
Anesthesia: General | Site: Abdomen | Wound class: Dirty or Infected

## 2011-09-14 MED ORDER — HEMOSTATIC AGENTS (NO CHARGE) OPTIME
TOPICAL | Status: DC | PRN
  Administered 2011-09-14: 1 via TOPICAL

## 2011-09-14 MED ORDER — NEOSTIGMINE METHYLSULFATE 1 MG/ML IJ SOLN
INTRAMUSCULAR | Status: DC | PRN
  Administered 2011-09-14: 5 mg via INTRAVENOUS

## 2011-09-14 MED ORDER — LACTATED RINGERS IV SOLN
INTRAVENOUS | Status: DC
  Administered 2011-09-14: 11:00:00 via INTRAVENOUS

## 2011-09-14 MED ORDER — HYDROMORPHONE HCL PF 1 MG/ML IJ SOLN
0.2500 mg | INTRAMUSCULAR | Status: DC | PRN
  Administered 2011-09-14 (×2): 0.5 mg via INTRAVENOUS

## 2011-09-14 MED ORDER — LIDOCAINE HCL (CARDIAC) 20 MG/ML IV SOLN
INTRAVENOUS | Status: DC | PRN
  Administered 2011-09-14: 60 mg via INTRAVENOUS

## 2011-09-14 MED ORDER — MIDAZOLAM HCL 5 MG/5ML IJ SOLN
INTRAMUSCULAR | Status: DC | PRN
  Administered 2011-09-14: 2 mg via INTRAVENOUS

## 2011-09-14 MED ORDER — GLYCOPYRROLATE 0.2 MG/ML IJ SOLN
INTRAMUSCULAR | Status: DC | PRN
  Administered 2011-09-14: .8 mg via INTRAVENOUS

## 2011-09-14 MED ORDER — SODIUM CHLORIDE 0.9 % IV SOLN
INTRAVENOUS | Status: DC | PRN
  Administered 2011-09-14: 13:00:00

## 2011-09-14 MED ORDER — VECURONIUM BROMIDE 10 MG IV SOLR
INTRAVENOUS | Status: DC | PRN
  Administered 2011-09-14: 2 mg via INTRAVENOUS

## 2011-09-14 MED ORDER — PROMETHAZINE HCL 25 MG/ML IJ SOLN: 6.2500 mg | INTRAMUSCULAR | Status: DC | PRN

## 2011-09-14 MED ORDER — LIDOCAINE HCL 4 % MT SOLN
OROMUCOSAL | Status: DC | PRN
  Administered 2011-09-14: 4 mL via TOPICAL

## 2011-09-14 MED ORDER — KETOROLAC TROMETHAMINE 30 MG/ML IJ SOLN
15.0000 mg | Freq: Once | INTRAMUSCULAR | Status: AC | PRN
  Administered 2011-09-14: 15 mg via INTRAVENOUS

## 2011-09-14 MED ORDER — FENTANYL CITRATE 0.05 MG/ML IJ SOLN
INTRAMUSCULAR | Status: DC | PRN
  Administered 2011-09-14 (×3): 50 ug via INTRAVENOUS
  Administered 2011-09-14: 100 ug via INTRAVENOUS
  Administered 2011-09-14: 150 ug via INTRAVENOUS

## 2011-09-14 MED ORDER — METOPROLOL TARTRATE 1 MG/ML IV SOLN
INTRAVENOUS | Status: DC | PRN
  Administered 2011-09-14 (×2): 2.5 mg via INTRAVENOUS

## 2011-09-14 MED ORDER — BUPIVACAINE-EPINEPHRINE 0.25% -1:200000 IJ SOLN
INTRAMUSCULAR | Status: DC | PRN
  Administered 2011-09-14: 14 mL

## 2011-09-14 MED ORDER — LACTATED RINGERS IV SOLN
INTRAVENOUS | Status: DC | PRN
  Administered 2011-09-14 (×2): via INTRAVENOUS

## 2011-09-14 MED ORDER — SODIUM CHLORIDE 0.9 % IR SOLN
Status: DC | PRN
  Administered 2011-09-14 (×5): 1000 mL

## 2011-09-14 MED ORDER — 0.9 % SODIUM CHLORIDE (POUR BTL) OPTIME
TOPICAL | Status: DC | PRN
  Administered 2011-09-14: 1000 mL

## 2011-09-14 MED ORDER — PROPOFOL 10 MG/ML IV EMUL
INTRAVENOUS | Status: DC | PRN
  Administered 2011-09-14: 200 mg via INTRAVENOUS
  Administered 2011-09-14: 30 mg via INTRAVENOUS

## 2011-09-14 MED ORDER — OXYCODONE-ACETAMINOPHEN 5-325 MG PO TABS
1.0000 | ORAL_TABLET | ORAL | Status: DC | PRN
  Administered 2011-09-14: 2 via ORAL
  Administered 2011-09-15: 1 via ORAL
  Administered 2011-09-15 – 2011-09-16 (×3): 2 via ORAL
  Administered 2011-09-16 (×3): 1 via ORAL
  Administered 2011-09-16 – 2011-09-17 (×2): 2 via ORAL
  Filled 2011-09-14 (×5): qty 2
  Filled 2011-09-14: qty 1
  Filled 2011-09-14: qty 2
  Filled 2011-09-14 (×3): qty 1

## 2011-09-14 MED ORDER — ROCURONIUM BROMIDE 100 MG/10ML IV SOLN
INTRAVENOUS | Status: DC | PRN
  Administered 2011-09-14: 50 mg via INTRAVENOUS

## 2011-09-14 MED ORDER — HYDROMORPHONE HCL PF 1 MG/ML IJ SOLN
INTRAMUSCULAR | Status: AC
  Filled 2011-09-14: qty 1

## 2011-09-14 MED ORDER — ONDANSETRON HCL 4 MG/2ML IJ SOLN
INTRAMUSCULAR | Status: DC | PRN
  Administered 2011-09-14: 4 mg via INTRAVENOUS

## 2011-09-14 SURGICAL SUPPLY — 59 items
ADH SKN CLS APL DERMABOND .7 (GAUZE/BANDAGES/DRESSINGS) ×1
APPLIER CLIP 5 13 M/L LIGAMAX5 (MISCELLANEOUS) ×2
APPLIER CLIP ROT 10 11.4 M/L (STAPLE)
APR CLP MED LRG 11.4X10 (STAPLE)
APR CLP MED LRG 5 ANG JAW (MISCELLANEOUS) ×1
BAG SPEC RTRVL LRG 6X4 10 (ENDOMECHANICALS) ×1
BLADE SURG ROTATE 9660 (MISCELLANEOUS) ×1 IMPLANT
CANISTER SUCTION 2500CC (MISCELLANEOUS) ×2 IMPLANT
CHLORAPREP W/TINT 26ML (MISCELLANEOUS) ×2 IMPLANT
CLIP APPLIE 5 13 M/L LIGAMAX5 (MISCELLANEOUS) ×1 IMPLANT
CLIP APPLIE ROT 10 11.4 M/L (STAPLE) IMPLANT
CLOTH BEACON ORANGE TIMEOUT ST (SAFETY) ×2 IMPLANT
COVER MAYO STAND STRL (DRAPES) ×2 IMPLANT
COVER SURGICAL LIGHT HANDLE (MISCELLANEOUS) ×2 IMPLANT
DECANTER SPIKE VIAL GLASS SM (MISCELLANEOUS) ×2 IMPLANT
DERMABOND ADVANCED (GAUZE/BANDAGES/DRESSINGS) ×1
DERMABOND ADVANCED .7 DNX12 (GAUZE/BANDAGES/DRESSINGS) ×1 IMPLANT
DRAIN CHANNEL 19F RND (DRAIN) ×1 IMPLANT
DRAPE C-ARM 42X72 X-RAY (DRAPES) ×2 IMPLANT
DRAPE UTILITY 15X26 W/TAPE STR (DRAPE) ×4 IMPLANT
ELECT REM PT RETURN 9FT ADLT (ELECTROSURGICAL) ×2
ELECTRODE REM PT RTRN 9FT ADLT (ELECTROSURGICAL) ×1 IMPLANT
EVACUATOR SILICONE 100CC (DRAIN) ×1 IMPLANT
GLOVE BIOGEL PI IND STRL 6 (GLOVE) IMPLANT
GLOVE BIOGEL PI IND STRL 7.0 (GLOVE) IMPLANT
GLOVE BIOGEL PI IND STRL 8 (GLOVE) ×1 IMPLANT
GLOVE BIOGEL PI INDICATOR 6 (GLOVE) ×1
GLOVE BIOGEL PI INDICATOR 7.0 (GLOVE) ×2
GLOVE BIOGEL PI INDICATOR 8 (GLOVE) ×2
GLOVE ECLIPSE 7.0 STRL STRAW (GLOVE) ×1 IMPLANT
GLOVE ECLIPSE 7.5 STRL STRAW (GLOVE) ×2 IMPLANT
GLOVE SS BIOGEL STRL SZ 6.5 (GLOVE) IMPLANT
GLOVE SS BIOGEL STRL SZ 7 (GLOVE) IMPLANT
GLOVE SUPERSENSE BIOGEL SZ 6.5 (GLOVE) ×1
GLOVE SUPERSENSE BIOGEL SZ 7 (GLOVE) ×1
GLOVE SURG SS PI 6.0 STRL IVOR (GLOVE) ×1 IMPLANT
GLOVE SURG SS PI 7.0 STRL IVOR (GLOVE) ×2 IMPLANT
GOWN STRL NON-REIN LRG LVL3 (GOWN DISPOSABLE) ×7 IMPLANT
HEMOSTAT SURGICEL 2X14 (HEMOSTASIS) ×1 IMPLANT
KIT BASIN OR (CUSTOM PROCEDURE TRAY) ×2 IMPLANT
KIT ROOM TURNOVER OR (KITS) ×2 IMPLANT
NS IRRIG 1000ML POUR BTL (IV SOLUTION) ×2 IMPLANT
PAD ARMBOARD 7.5X6 YLW CONV (MISCELLANEOUS) ×4 IMPLANT
POUCH SPECIMEN RETRIEVAL 10MM (ENDOMECHANICALS) ×1 IMPLANT
SCISSORS LAP 5X35 DISP (ENDOMECHANICALS) IMPLANT
SET CHOLANGIOGRAPH 5 50 .035 (SET/KITS/TRAYS/PACK) ×2 IMPLANT
SET IRRIG TUBING LAPAROSCOPIC (IRRIGATION / IRRIGATOR) ×2 IMPLANT
SLEEVE ENDOPATH XCEL 5M (ENDOMECHANICALS) ×4 IMPLANT
SPECIMEN JAR SMALL (MISCELLANEOUS) ×2 IMPLANT
SUT ETHILON 2 0 FS 18 (SUTURE) ×1 IMPLANT
SUT MNCRL AB 4-0 PS2 18 (SUTURE) ×2 IMPLANT
SUT VICRYL 0 UR6 27IN ABS (SUTURE) ×1 IMPLANT
TOWEL OR 17X24 6PK STRL BLUE (TOWEL DISPOSABLE) ×2 IMPLANT
TOWEL OR 17X26 10 PK STRL BLUE (TOWEL DISPOSABLE) ×2 IMPLANT
TRAY LAPAROSCOPIC (CUSTOM PROCEDURE TRAY) ×2 IMPLANT
TROCAR XCEL BLUNT TIP 100MML (ENDOMECHANICALS) ×2 IMPLANT
TROCAR XCEL NON-BLD 11X100MML (ENDOMECHANICALS) ×1 IMPLANT
TROCAR XCEL NON-BLD 5MMX100MML (ENDOMECHANICALS) ×2 IMPLANT
WATER STERILE IRR 1000ML POUR (IV SOLUTION) IMPLANT

## 2011-09-14 NOTE — Op Note (Signed)
OPERATIVE REPORT  DATE OF OPERATION: 09/11/2011 - 09/14/2011  PATIENT:  Dean Cole  61 y.o. male  PRE-OPERATIVE DIAGNOSIS:  ACUTE CHOLECYSTITIS  POST-OPERATIVE DIAGNOSIS:  ACUTE CHOLECYSTITIS  PROCEDURE:  Procedure(s): LAPAROSCOPIC CHOLECYSTECTOMY WITH INTRAOPERATIVE CHOLANGIOGRAM  SURGEON:  Surgeon(s): Cherylynn Ridges, MD  ASSISTANT: Earl Gala  ANESTHESIA:   general  EBL: 200 ml  BLOOD ADMINISTERED: none  DRAINS: (19 Fr.) Blake drain(s) in the RUQ   SPECIMEN:  Source of Specimen:  Gallbladder and stones  COUNTS CORRECT:  YES  PROCEDURE DETAILS: The patient was taken to the operating room and placed on the table in the supine position. After an adequate general endotracheal anesthetic was administered a proper time out was performed identifying the patient and the procedure to be performed a percutaneous gallbladder tube was disconnected and prepped into the field as she was prepped and draped in usual sterile manner.  An infraumbilical midline incision was made using a #15 blade and taken down to the midline fascia. We incised the fascia using 15 blade and grabbed the edges were Kocher clamps. Once this was done bluntly dissected down into the peritoneal cavity using a Kelly clamp. With this being done in 0 Vicryl pursestring suture was passed around the fascial opening was secured and the [facility] cannula. Once all is in place we are able to pass the camera with attached light source into the peritoneal cavity noted that the patient had a significant adynamic ileus which allowed a large amount of large and small bowel to be dilated and obscuring our field. We placed a subxiphoid  11 mm cannula under direct vision. We then  bluntly dissect away some of the omental attachments to the markedly inflamed gallbladder at the edge of the liver. We then cut the percutaneous tube which was  brought the other end into the peritoneal cavity. We are able to get into right upper quadrant 5 mm  cannulas. With that being done the patient was placed in reverse Trendelenburg and the left side was tilted down.  There were large amount of adhesions of the omentum to the dome and body of the gallbladder which we were able to dissect away using a Ambulance person. We eventually had to change to a 10 mm 30 laparoscope in order  to get adequate visualization of the infundibulum. As we retracted the gallbladder up towards the anterior abdominal wall we were able to dissect out the cystic duct and the cystic artery both that were caught up in an inflammatory mass near the base of the gallbladder. We were able to get around the cystic duct adequately and 360 degree and a circumferential around it where we clipped it proximally and distally and transected. The cystic artery was dissected free in a similar manner.  The bulk of the case was involved in trying to remove the gallbladder from its bed and also from the abdominal wall because he has significant thickness of the gallbladder and large stones. We did and dissected from the bed with a significant difficulty obtaining hemostasis with electrocautery. We subsequently had to place some Surgicel in the bed and cauterize it at high frequency. We used an Endo Catch bag to remove the gallbladder. We did place a 56 [place] Blake drain in Morrison's pouch in order to get adequate coverage of that area in case he should have some leakage from the duct of L[place]chka and the hub of the cystic duct was adequately clip.  We had to enlarge the fascial  opening to remove the gallbladder because of the thickness and the stone. Once this was done we did place a second pursestring suture of 0 Vicryl around the fascial opening and secured down that site. He site was injected with quarter percent Marcaine with epinephrine. All counts were correct we aspirated all fluid and gas and then brought out the [place] drain out of the lateralmost trocar site. This secured  in place with a 2-0 nylon are the skin was closed after injecting the site with Marcaine using running subcuticular stitch of 4-0 Monocryl Dermabond Steri-Strips and Tegaderms use complete all dressings all counts were correct.  PATIENT DISPOSITION:  PACU - hemodynamically stable.   Zakhai Meisinger III,Rondle Lohse O 5/16/20132:34 PM

## 2011-09-14 NOTE — Anesthesia Procedure Notes (Signed)
Performed by: Margaree Mackintosh

## 2011-09-14 NOTE — Anesthesia Preprocedure Evaluation (Signed)
Anesthesia Evaluation  Patient identified by MRN, date of birth, ID band Patient awake    Reviewed: Allergy & Precautions, H&P , NPO status , Patient's Chart, lab work & pertinent test results  Airway Mallampati: II TM Distance: >3 FB Neck ROM: Full    Dental No notable dental hx.    Pulmonary neg pulmonary ROS,  breath sounds clear to auscultation  Pulmonary exam normal       Cardiovascular hypertension, + CAD and + CABG + dysrhythmias Supra Ventricular Tachycardia Rhythm:Regular Rate:Normal     Neuro/Psych negative neurological ROS  negative psych ROS   GI/Hepatic negative GI ROS, Neg liver ROS,   Endo/Other  Hypothyroidism   Renal/GU negative Renal ROS  negative genitourinary   Musculoskeletal negative musculoskeletal ROS (+)   Abdominal   Peds negative pediatric ROS (+)  Hematology negative hematology ROS (+)   Anesthesia Other Findings   Reproductive/Obstetrics negative OB ROS                           Anesthesia Physical Anesthesia Plan  ASA: III  Anesthesia Plan: General   Post-op Pain Management:    Induction: Intravenous  Airway Management Planned: Oral ETT  Additional Equipment:   Intra-op Plan:   Post-operative Plan: Extubation in OR  Informed Consent: I have reviewed the patients History and Physical, chart, labs and discussed the procedure including the risks, benefits and alternatives for the proposed anesthesia with the patient or authorized representative who has indicated his/her understanding and acceptance.   Dental advisory given  Plan Discussed with: CRNA  Anesthesia Plan Comments:         Anesthesia Quick Evaluation

## 2011-09-14 NOTE — Progress Notes (Signed)
For Lap and possible open cholecystectomy today.  Delayed because of an emergency.  Dean Cole. Gae Bon, MD, FACS [phone] [facility] Surgery

## 2011-09-14 NOTE — Anesthesia Postprocedure Evaluation (Signed)
  Anesthesia Post-op Note  Patient: Dean Cole  Procedure(s) Performed: Procedure(s) (LRB): LAPAROSCOPIC CHOLECYSTECTOMY WITH INTRAOPERATIVE CHOLANGIOGRAM (N/A)  Patient Location: PACU  Anesthesia Type: General  Level of Consciousness: awake, alert  and oriented  Airway and Oxygen Therapy: Patient Spontanous Breathing and Patient connected to nasal cannula oxygen  Post-op Pain: mild  Post-op Assessment: Post-op Vital signs reviewed and Patient's Cardiovascular Status Stable  Post-op Vital Signs: stable  Complications: No apparent anesthesia complications

## 2011-09-14 NOTE — Progress Notes (Signed)
SUBJECTIVE:  No chest pain.  No SOB   PHYSICAL EXAM Filed Vitals:   09/13/11 1800 09/13/11 2000 09/13/11 2309 09/14/11 0400  BP: 131/76 133/72 114/65 117/65  Pulse: 105 112 76 112  Temp:  98.8 F (37.1 C) 98.5 F (36.9 C) 98.3 F (36.8 C)  TempSrc:  Oral Oral Oral  Resp:  20 20 22   Height:    5\' 10"  (1.778 m)  Weight:    195 lb 4.8 oz (88.587 kg)  SpO2:  95% 100% 95%   General:  No distress Lungs:  [place] Heart:  RRR Abdomen:  Decreased bowel sounds.  Drain in plce Extremities:  No edema  LABS: Lab Results  Component Value Date   CKTOTAL 106 09/11/2011   CKMB 1.6 09/11/2011   TROPONINI <0.30 09/11/2011   Results for orders placed during the hospital encounter of 09/11/11 (from the past 24 hour(s))  CBC     Status: Abnormal   Collection Time   09/13/11  9:10 AM      Component Value Range   WBC 14.2 (*) 4.0 - 10.5 (K/uL)   RBC 3.86 (*) 4.22 - 5.81 (MIL/uL)   Hemoglobin 10.7 (*) 13.0 - 17.0 (g/dL)   HCT 19.1 (*) 47.8 - 52.0 (%)   MCV 85.2  78.0 - 100.0 (fL)   MCH 27.7  26.0 - 34.0 (pg)   MCHC 32.5  30.0 - 36.0 (g/dL)   RDW 29.5  62.1 - 30.8 (%)   Platelets 159  150 - 400 (K/uL)  BASIC METABOLIC PANEL     Status: Abnormal   Collection Time   09/13/11  9:10 AM      Component Value Range   Sodium 132 (*) 135 - 145 (mEq/L)   Potassium 4.3  3.5 - 5.1 (mEq/L)   Chloride 95 (*) 96 - 112 (mEq/L)   CO2 26  19 - 32 (mEq/L)   Glucose, Bld 95  70 - 99 (mg/dL)   BUN 36 (*) 6 - 23 (mg/dL)   Creatinine, Ser 6.57 (*) 0.50 - 1.35 (mg/dL)   Calcium 8.7  8.4 - 84.6 (mg/dL)   GFR calc non Af Amer 48 (*) >90 (mL/min)   GFR calc Af Amer 55 (*) >90 (mL/min)  COMPREHENSIVE METABOLIC PANEL     Status: Abnormal   Collection Time   09/14/11  5:10 AM      Component Value Range   Sodium 135  135 - 145 (mEq/L)   Potassium 4.0  3.5 - 5.1 (mEq/L)   Chloride 96  96 - 112 (mEq/L)   CO2 29  19 - 32 (mEq/L)   Glucose, Bld 121 (*) 70 - 99 (mg/dL)   BUN 18  6 - 23 (mg/dL)   Creatinine, Ser  9.62  0.50 - 1.35 (mg/dL)   Calcium 8.9  8.4 - 95.2 (mg/dL)   Total Protein 6.6  6.0 - 8.3 (g/dL)   Albumin 2.7 (*) 3.5 - 5.2 (g/dL)   AST 14  0 - 37 (U/L)   ALT 13  0 - 53 (U/L)   Alkaline Phosphatase 133 (*) 39 - 117 (U/L)   Total Bilirubin 0.4  0.3 - 1.2 (mg/dL)   GFR calc non Af Amer 70 (*) >90 (mL/min)   GFR calc Af Amer 82 (*) >90 (mL/min)  CBC     Status: Abnormal   Collection Time   09/14/11  5:10 AM      Component Value Range   WBC 8.0  4.0 - 10.5 (  K/uL)   RBC 3.79 (*) 4.22 - 5.81 (MIL/uL)   Hemoglobin 10.2 (*) 13.0 - 17.0 (g/dL)   HCT 40.9 (*) 81.1 - 52.0 (%)   MCV 85.8  78.0 - 100.0 (fL)   MCH 26.9  26.0 - 34.0 (pg)   MCHC 31.4  30.0 - 36.0 (g/dL)   RDW 91.4  78.2 - 95.6 (%)   Platelets 179  150 - 400 (K/uL)  DIFFERENTIAL     Status: Normal   Collection Time   09/14/11  5:10 AM      Component Value Range   Neutrophils Relative 73  43 - 77 (%)   Neutro Abs 5.9  1.7 - 7.7 (K/uL)   Lymphocytes Relative 17  12 - 46 (%)   Lymphs Abs 1.4  0.7 - 4.0 (K/uL)   Monocytes Relative 5  3 - 12 (%)   Monocytes Absolute 0.4  0.1 - 1.0 (K/uL)   Eosinophils Relative 4  0 - 5 (%)   Eosinophils Absolute 0.3  0.0 - 0.7 (K/uL)   Basophils Relative 0  0 - 1 (%)   Basophils Absolute 0.0  0.0 - 0.1 (K/uL)    Intake/Output Summary (Last 24 hours) at 09/14/11 0801 Last data filed at 09/14/11 0500  Gross per 24 hour  Intake 2163.75 ml  Output   2425 ml  Net -261.25 ml    ASSESSMENT AND PLAN:  CABG:  No cardiac contraindications to surgery.  No symptoms.  We will follow after surgery.  Continue current therapy.  RI:  Creat is back down.  No further evaluation.  Fayrene Fearing [facility] 09/14/2011 8:01 AM

## 2011-09-14 NOTE — Transfer of Care (Signed)
Immediate Anesthesia Transfer of Care Note  Patient: Dean Cole  Procedure(s) Performed: Procedure(s) (LRB): LAPAROSCOPIC CHOLECYSTECTOMY WITH INTRAOPERATIVE CHOLANGIOGRAM (N/A)  Patient Location: PACU  Anesthesia Type: General  Level of Consciousness: awake, alert  and oriented  Airway & Oxygen Therapy: Patient Spontanous Breathing and Patient connected to nasal cannula oxygen  Post-op Assessment: Report given to PACU RN, Post -op Vital signs reviewed and stable and Patient moving all extremities  Post vital signs: Reviewed and stable  Complications: No apparent anesthesia complications

## 2011-09-14 NOTE — Progress Notes (Signed)
ANTIBIOTIC CONSULT NOTE - FOLLOW UP  Pharmacy Consult for Zosyn Indication: cholecystitis  No Known Allergies  Patient Measurements: Height: 5\' 10"  (177.8 cm) Weight: 195 lb 4.8 oz (88.587 kg) IBW/kg (Calculated) : 73   Vital Signs: Temp: 97.5 F (36.4 C) (05/16 0800) Temp src: Oral (05/16 0800) BP: 120/73 mmHg (05/16 0800) Pulse Rate: 95  (05/16 0800) Intake/Output from previous day: 05/15 0701 - 05/16 0700 In: 2523.8 [P.O.:1080; I.V.:1443.8] Out: 2675 [Urine:2400; Drains:275] Intake/Output from this shift:    Labs:  Basename 09/14/11 0510 09/13/11 0910 09/12/11 0500  WBC 8.0 14.2* 22.6*  HGB 10.2* 10.7* 13.2  PLT 179 159 204  LABCREA -- -- --  CREATININE 1.11 1.53* 2.09*   Estimated Creatinine Clearance: 79.3 ml/min (by C-G formula based on Cr of 1.11). No results found for this basename: VANCOTROUGH:2,VANCOPEAK:2,VANCORANDOM:2,GENTTROUGH:2,GENTPEAK:2,GENTRANDOM:2,TOBRATROUGH:2,TOBRAPEAK:2,TOBRARND:2,AMIKACINPEAK:2,AMIKACINTROU:2,AMIKACIN:2, in the last 72 hours   Microbiology: Recent Results (from the past 720 hour(s))  SURGICAL PCR SCREEN     Status: Normal   Collection Time   09/12/11  8:14 AM      Component Value Range Status Comment   MRSA, PCR NEGATIVE  NEGATIVE  Final    Staphylococcus aureus NEGATIVE  NEGATIVE  Final   BODY FLUID CULTURE     Status: Normal (Preliminary result)   Collection Time   09/12/11 11:54 AM      Component Value Range Status Comment   Specimen Description BILE GALL BLADDER   Final    Special Requests NONE   Final    Gram Stain     Final    Value: NO WBC SEEN     NO ORGANISMS SEEN   Culture NO GROWTH 2 DAYS   Final    Report Status PENDING   Incomplete     Anti-infectives     Start     Dose/Rate Route Frequency Ordered Stop   09/11/11 1800  piperacillin-tazobactam (ZOSYN) IVPB 3.375 g       3.375 g 12.5 mL/hr over 240 Minutes Intravenous 3 times per day 09/11/11 1629     09/11/11 1500   piperacillin-tazobactam (ZOSYN) IVPB  3.375 g  Status:  Discontinued        3.375 g 100 mL/hr over 30 Minutes Intravenous [facility] 09/11/11 1445 09/11/11 1639          Assessment: Cholecystitis:  On Day #4 of antibiotic therapy with Zosyn.  His renal function has returned to normal.  Goal of Therapy:  Appropriate antimicrobial therapy  Plan:  Continue Zosyn 3.375gm IV q8h extended infusion As no dosage adjustments are anticipated Pharmacy will sign off.  Thank you for the consult.  Estella Husk, Pharm.D., BCPS Clinical Pharmacist  Phone [phone] Pager [phone] 09/14/2011, 11:14 AM

## 2011-09-14 NOTE — Preoperative (Signed)
Beta Blockers   Reason not to administer Beta Blockers:Not Applicable 

## 2011-09-15 ENCOUNTER — Encounter (HOSPITAL_COMMUNITY): Payer: Self-pay | Admitting: General Surgery

## 2011-09-15 LAB — DIFFERENTIAL
Basophils Absolute: 0 10*3/uL (ref 0.0–0.1)
Basophils Relative: 0 % (ref 0–1)
Eosinophils Relative: 4 % (ref 0–5)
Lymphocytes Relative: 26 % (ref 12–46)

## 2011-09-15 LAB — COMPREHENSIVE METABOLIC PANEL
ALT: 18 U/L (ref 0–53)
AST: 26 U/L (ref 0–37)
Albumin: 2.3 g/dL — ABNORMAL LOW (ref 3.5–5.2)
CO2: 30 mEq/L (ref 19–32)
Calcium: 8.3 mg/dL — ABNORMAL LOW (ref 8.4–10.5)
Creatinine, Ser: 1.13 mg/dL (ref 0.50–1.35)
Sodium: 136 mEq/L (ref 135–145)
Total Protein: 5.8 g/dL — ABNORMAL LOW (ref 6.0–8.3)

## 2011-09-15 LAB — CBC
MCHC: 31.7 g/dL (ref 30.0–36.0)
MCV: 85.3 fL (ref 78.0–100.0)
Platelets: 170 10*3/uL (ref 150–400)
RDW: 13.2 % (ref 11.5–15.5)
WBC: 6 10*3/uL (ref 4.0–10.5)

## 2011-09-15 LAB — BODY FLUID CULTURE

## 2011-09-15 MED ORDER — SODIUM CHLORIDE 0.9 % IV SOLN
INTRAVENOUS | Status: DC
  Administered 2011-09-15: 02:00:00 via INTRAVENOUS

## 2011-09-15 MED ORDER — METOPROLOL TARTRATE 25 MG PO TABS
25.0000 mg | ORAL_TABLET | Freq: Every day | ORAL | Status: DC
  Administered 2011-09-15 – 2011-09-17 (×3): 25 mg via ORAL
  Filled 2011-09-15 (×3): qty 1

## 2011-09-15 MED ORDER — PANTOPRAZOLE SODIUM 40 MG PO TBEC
40.0000 mg | DELAYED_RELEASE_TABLET | Freq: Every day | ORAL | Status: DC
  Administered 2011-09-15 – 2011-09-16 (×2): 40 mg via ORAL
  Filled 2011-09-15 (×2): qty 1

## 2011-09-15 NOTE — Progress Notes (Signed)
Utilization review completed.  

## 2011-09-15 NOTE — Progress Notes (Signed)
PROGRESS NOTE  Subjective:   Dean Cole is a 61 yo who is s/p CABG 2 months ago.  Admitted with cholecystitis.  Had Lap cholecystectomy yesterday.  He is doing well.  Mild dyspnea.  No chest pain.  VS are stable.  He had some tachycardia before surgery but this has resolved.     Objective:    Vital Signs:   Temp:  [97.5 F (36.4 C)-98.3 F (36.8 C)] 97.7 F (36.5 C) (05/17 0400) Pulse Rate:  [67-123] 82  (05/17 0535) Resp:  [17-27] 17  (05/17 0400) BP: (111-156)/(64-86) 111/64 mmHg (05/17 0535) SpO2:  [86 %-100 %] 96 % (05/17 0400)  Last BM Date: 09/13/11   24-hour weight change: Weight change:   Weight trends: Filed Weights   09/14/11 0400  Weight: 195 lb 4.8 oz (88.587 kg)    Intake/Output:  05/16 0701 - 05/17 0700 In: 1200 [I.V.:1200] Out: 780 [Urine:300; Drains:330; Blood:150]     Physical Exam: BP 111/64  Pulse 82  Temp(Src) 97.7 F (36.5 C) (Oral)  Resp 17  Ht 5\' 10"  (1.778 m)  Wt 195 lb 4.8 oz (88.587 kg)  BMI 28.02 kg/m2  SpO2 96%  General: Vital signs reviewed and noted. Well-developed, well-nourished, in no acute distress; alert, appropriate and cooperative throughout examination. Multiple tattoos.   Head: Normocephalic, atraumatic.  Eyes: conjunctivae/corneas clear. PERRL, EOM's intact. Fundi benign.  Throat: normal  Neck: Supple. Normal carotids. No JVD  Lungs:  Clear bilaterally to auscultation without wheezes, rales, or rhonchi. Breathing is unlabored.  Heart: Regular rate,  With normal  S1 S2. No murmurs, gallops or rubs  Abdomen:  Mildly distended.  Few BS.    Extremities: Distal pedal pulses are 2+ .  No edema.    Neurologic: A&O X3, CN II - XII are grossly intact. Motor strength is 5/5 in the all 4 extremities.  Psych: Responds to questions appropriately with normal affect.    Labs: BMET:  Basename 09/15/11 0542 09/14/11 0510  NA 136 135  K 4.1 4.0  CL 99 96  CO2 30 29  GLUCOSE 93 121*  BUN 15 18  CREATININE 1.13 1.11    CALCIUM 8.3* 8.9  MG -- --  PHOS -- --    Liver function tests:  Basename 09/15/11 0542 09/14/11 0510  AST 26 14  ALT 18 13  ALKPHOS 78 133*  BILITOT 0.4 0.4  PROT 5.8* 6.6  ALBUMIN 2.3* 2.7*   No results found for this basename: LIPASE:2,AMYLASE:2 in the last 72 hours  CBC:  Basename 09/15/11 0542 09/14/11 0510  WBC 6.0 8.0  NEUTROABS 3.8 5.9  HGB 9.2* 10.2*  HCT 29.0* 32.5*  MCV 85.3 85.8  PLT 170 179    Cardiac Enzymes: No results found for this basename: CKTOTAL:4,CKMB:4,TROPONINI:4 in the last 72 hours  Coagulation Studies: No results found for this basename: LABPROT:5,INR:5 in the last 72 hours  Other:   Tele:  NSR  Medications:    Infusions:    . sodium chloride 50 mL/hr at 09/15/11 0130  . DISCONTD: dextrose 5 % and 0.45 % NaCl with KCl 20 mEq/L 125 mL/hr at 09/14/11 0420  . DISCONTD: lactated ringers 20 mL/hr at 09/14/11 1038    Scheduled Medications:    . diazepam  5 mg Oral QID  . HYDROmorphone      . imipramine  75 mg Oral QHS  . levothyroxine  150 mcg Oral Daily  . metoprolol  5 mg Intravenous Q6H  . pantoprazole (PROTONIX)  IV  40 mg Intravenous QHS  . piperacillin-tazobactam (ZOSYN)  IV  3.375 g Intravenous Q8H    Assessment/ Plan:    1. CAD- s/p CABG 2 months ago.  Stable.  Would start home meds when he is eating ( supposed to get clear liquids this am).  Getting IV metoprolol at present.  Will change IV metoprolol to PO.  He can probably go home soon.     Disposition:  Length of Stay: 4  Vesta Mixer, Montez Hageman., MD, [facility] 09/15/2011, 7:32 AM Office [phone] Pager [phone]

## 2011-09-15 NOTE — Progress Notes (Addendum)
[facility] Progress Note 1 Day Post-Op  Subjective: The patient seems to be doing a lot better.  Objective: Vital signs in last 24 hours: Temp:  [97.7 F (36.5 C)-98.3 F (36.8 C)] 97.7 F (36.5 C) (05/17 0400) Pulse Rate:  [67-123] 82  (05/17 0535) Resp:  [17-27] 17  (05/17 0400) BP: (111-156)/(64-86) 111/64 mmHg (05/17 0535) SpO2:  [86 %-100 %] 96 % (05/17 0400) Last BM Date: 09/13/11  Intake/Output from previous day: 05/16 0701 - 05/17 0700 In: 1200 [I.V.:1200] Out: 780 [Urine:300; Drains:330; Blood:150] Intake/Output this shift: Total I/O In: 360 [P.O.:360] Out: -   General: No acute distress.  Seems a lot better  Lungs: Clear.  Much easier to breath.  Abd: Soft, distended, hypoactive bowel sounds.  Blake drainage not much, Seems sero-sanguinaous.  Extremities: No DVT signs or symptoms  Neuro: Intact  Lab Results:  @LABLAST2 (wbc:2,hgb:2,hct:2,plt:2) BMET  Basename 09/15/11 0542 09/14/11 0510  NA 136 135  K 4.1 4.0  CL 99 96  CO2 30 29  GLUCOSE 93 121*  BUN 15 18  CREATININE 1.13 1.11  CALCIUM 8.3* 8.9   PT/INR No results found for this basename: LABPROT:2,INR:2 in the last 72 hours ABG No results found for this basename: PHART:2,PCO2:2,PO2:2,HCO3:2 in the last 72 hours  Studies/Results: No results found.  Anti-infectives: Anti-infectives     Start     Dose/Rate Route Frequency Ordered Stop   09/11/11 1800  piperacillin-tazobactam (ZOSYN) IVPB 3.375 g       3.375 g 12.5 mL/hr over 240 Minutes Intravenous 3 times per day 09/11/11 1629     09/11/11 1500   piperacillin-tazobactam (ZOSYN) IVPB 3.375 g  Status:  Discontinued        3.375 g 100 mL/hr over 30 Minutes Intra[facility]ous [facility] 09/11/11 1445 09/11/11 1639          Assessment/Plan: s/p Procedure(s): LAPAROSCOPIC CHOLECYSTECTOMY WITH INTRAOPERATIVE CHOLANGIOGRAM Advance diet TKO IVFs Full liquids today, advance to regular tomorrow, likely home on Sunday.  LOS: 4 days    Marta Lamas. Gae Bon, MD, FACS [phone] [facility] 09/15/2011

## 2011-09-16 NOTE — Progress Notes (Signed)
Patient taught wound care dressing.Emptying and recording drainage as well as charging JP drain done with more than adequate return demonstration. Ancil Linsey RN

## 2011-09-16 NOTE — Progress Notes (Signed)
2 Days Post-Op  Subjective: Feels better today, tolerating diet, not much pain  Objective: Vital signs in last 24 hours: Temp:  [97.6 F (36.4 C)-98.3 F (36.8 C)] 98.3 F (36.8 C) (05/18 0610) Pulse Rate:  [79-103] 92  (05/18 1104) Resp:  [18] 18  (05/18 0610) BP: (116-147)/(74-79) 116/79 mmHg (05/18 1104) SpO2:  [96 %-97 %] 97 % (05/18 0610)   Intake/Output from previous day: 05/17 0701 - 05/18 0700 In: 720 [P.O.:720] Out: 100 [Drains:100] Intake/Output this shift: Total I/O In: 240 [P.O.:240] Out: -    General appearance: alert, cooperative and no distress GI: Abd soft not tender, drain sero-sanguinous  Incision: healing well  Lab Results:   Basename 09/15/11 0542 09/14/11 0510  WBC 6.0 8.0  HGB 9.2* 10.2*  HCT 29.0* 32.5*  PLT 170 179   BMET  Basename 09/15/11 0542 09/14/11 0510  NA 136 135  K 4.1 4.0  CL 99 96  CO2 30 29  GLUCOSE 93 121*  BUN 15 18  CREATININE 1.13 1.11  CALCIUM 8.3* 8.9   PT/INR No results found for this basename: LABPROT:2,INR:2 in the last 72 hours ABG No results found for this basename: PHART:2,PCO2:2,PO2:2,HCO3:2 in the last 72 hours  MEDS, Scheduled    . diazepam  5 mg Oral QID  . imipramine  75 mg Oral QHS  . levothyroxine  150 mcg Oral Daily  . metoprolol tartrate  25 mg Oral Daily  . pantoprazole  40 mg Oral QHS  . piperacillin-tazobactam (ZOSYN)  IV  3.375 g Intravenous Q8H    Studies/Results: No results found.  Assessment: s/p Procedure(s): LAPAROSCOPIC CHOLECYSTECTOMY WITH INTRAOPERATIVE CHOLANGIOGRAM Improving  Plan: Continue IV ab today, likely dc tomorrow   LOS: 5 days     Currie Paris, MD, [facility], [place] [phone]   09/16/2011 11:19 AM

## 2011-09-17 MED ORDER — OXYCODONE-ACETAMINOPHEN 5-325 MG PO TABS: 1.0000 | ORAL_TABLET | ORAL | Status: AC | PRN

## 2011-09-17 NOTE — Progress Notes (Signed)
3 Days Post-Op  Subjective: No complaints. Pt and family feel comfortable with the drain  Objective: Vital signs in last 24 hours: Temp:  [97.4 F (36.3 C)-98.3 F (36.8 C)] 97.9 F (36.6 C) (05/19 0500) Pulse Rate:  [74-92] 74  (05/19 0500) Resp:  [18-20] 18  (05/19 0500) BP: (116-134)/(73-82) 131/82 mmHg (05/19 0500) SpO2:  [96 %-99 %] 97 % (05/19 0500) Last BM Date: 09/16/11  Intake/Output from previous day: 05/18 0701 - 05/19 0700 In: 840 [P.O.:840] Out: 67 [Urine:4; Drains:60; Stool:3] Intake/Output this shift: Total I/O In: 120 [P.O.:120] Out: -   GI: soft, nontender. drain output serosang  Lab Results:   Basename 09/15/11 0542  WBC 6.0  HGB 9.2*  HCT 29.0*  PLT 170   BMET  Basename 09/15/11 0542  NA 136  K 4.1  CL 99  CO2 30  GLUCOSE 93  BUN 15  CREATININE 1.13  CALCIUM 8.3*   PT/INR No results found for this basename: LABPROT:2,INR:2 in the last 72 hours ABG No results found for this basename: PHART:2,PCO2:2,PO2:2,HCO3:2 in the last 72 hours  Studies/Results: No results found.  Anti-infectives: Anti-infectives     Start     Dose/Rate Route Frequency Ordered Stop   09/11/11 1800   piperacillin-tazobactam (ZOSYN) IVPB 3.375 g        3.375 g 12.5 mL/hr over 240 Minutes Intravenous 3 times per day 09/11/11 1629     09/11/11 1500   piperacillin-tazobactam (ZOSYN) IVPB 3.375 g  Status:  Discontinued        3.375 g 100 mL/hr over 30 Minutes Intra[facility]ous [facility] 09/11/11 1445 09/11/11 1639          Assessment/Plan: s/p Procedure(s) (LRB): LAPAROSCOPIC CHOLECYSTECTOMY WITH INTRAOPERATIVE CHOLANGIOGRAM (N/A) Discharge Pt will care for drain at home  LOS: 6 days    TOTH III,Caid Radin S 09/17/2011

## 2011-09-17 NOTE — Discharge Instructions (Signed)
Sponge bathe while drain is in. No heavy lifting

## 2011-09-18 ENCOUNTER — Telehealth (INDEPENDENT_AMBULATORY_CARE_PROVIDER_SITE_OTHER): Payer: Self-pay | Admitting: General Surgery

## 2011-09-18 NOTE — Telephone Encounter (Signed)
Michelle's out of the office today, I will forward this message to her so she can select the patient appointment date.  [employer]

## 2011-09-18 NOTE — Telephone Encounter (Signed)
Pt of Dr. Lindie Spruce discharged home with drain and instructions to be seen in the office in one week.  Drainage is slightly bloody; wife knows how to milk the tubing.  She needs a work-in appt, please.

## 2011-09-22 ENCOUNTER — Encounter (INDEPENDENT_AMBULATORY_CARE_PROVIDER_SITE_OTHER): Payer: Self-pay | Admitting: General Surgery

## 2011-09-22 ENCOUNTER — Ambulatory Visit (INDEPENDENT_AMBULATORY_CARE_PROVIDER_SITE_OTHER): Payer: Medicare Other | Admitting: General Surgery

## 2011-09-22 VITALS — BP 138/72 | HR 80 | Temp 97.8°F | Resp 16 | Ht 70.0 in | Wt 182.6 lb

## 2011-09-22 DIAGNOSIS — Z09 Encounter for follow-up examination after completed treatment for conditions other than malignant neoplasm: Secondary | ICD-10-CM

## 2011-09-22 NOTE — Progress Notes (Signed)
HPI The patient comes in status post difficult laparoscopic cholecystectomy for acute cholecystitis and cholelithiasis. He has a Blake drain still in place in the right upper quadrant.  PE His wounds have healed well with no evidence of infection. He is eating well. He has no abdominal tenderness and no palpable masses. The Blake drain is draining minimal amounts of serosanguineous fluid. There is no bile drainage.  Studiy review None.  Assessment Status post laparoscopic cholecystectomy for acute cholecystitis and cholelithiasis.  Plan Return to see me on a p.r.n. basis. Both his diet and activity level up to normal.

## 2011-09-22 NOTE — Discharge Summary (Signed)
Patient ID: Dean Cole MRN: [id number] DOB/AGE: May 25, 1950 61 y.o.  Admit date: 09/11/2011 Discharge date: 09/17/2011  Procedures: lap chole with IOC  Consults: cardiology  Reason for Admission: this is a 61 yo male who was admitted with acute cholecystitis.  Admission Diagnoses:  1. Acute cholecystitis Patient Active Problem List  Diagnoses  . HTN (hypertension)  . Hypothyroidism  . Coronary artery disease  . Borderline diabetes  . Cancer  . Headache  . Unstable angina  . Restless leg syndrome  . PSVT (paroxysmal supraventricular tachycardia)  . Acute cholecystitis  . Postop check    Hospital Course: the patient was admitted and placed on IV abx therapy.  Due to a complicated OR schedule and multiple emergencies, we were not able to immediately take the patient to the operating room.  Therefore, we had IR place a perc drain in his gallbladder to help with inflammation.  Eventually, he was taken to the operating room where he underwent a lap chole and had a JP drain placed.  Due to the amount of infection, the patient remained on abx therapy for several days afterwards.  Finally by, POD# 3, the patient was able to tolerated a regular diet and po pain medications.  His JP drain was left in place at time of discharge.  Discharge Diagnoses:  Principal Problem:  *Acute cholecystitis s/p lap chole  Discharge Medications: Medication List  As of 09/22/2011  3:30 PM   TAKE these medications         aspirin 325 MG EC tablet   Take 1 tablet (325 mg total) by mouth daily.      diazepam 5 MG tablet   Commonly known as: VALIUM   Take 5 mg by mouth 4 (four) times daily. For nerves      imipramine 25 MG tablet   Commonly known as: TOFRANIL   Take 75 mg by mouth at bedtime.      levothyroxine 150 MCG tablet   Commonly known as: SYNTHROID, LEVOTHROID   Take 150 mcg by mouth daily.      metoprolol tartrate 25 MG tablet   Commonly known as: LOPRESSOR   Take 25 mg by mouth 2  (two) times daily.      oxyCODONE-acetaminophen 5-325 MG per tablet   Commonly known as: PERCOCET   Take 1 tablet by mouth every 4 (four) hours as needed for pain.      polyethylene glycol packet   Commonly known as: MIRALAX / GLYCOLAX   Take 17 g by mouth daily. As needed      simvastatin 20 MG tablet   Commonly known as: ZOCOR   Take 1 tablet (20 mg total) by mouth daily at 6 PM.            Discharge Instructions: Follow-up Information    Follow up with WYATT Rene Kocher, MD in 1 week.   Contact information:   [facility] [address] [place] [address] [phone]          Signed: Letha Cape 09/22/2011, 3:30 PM

## 2011-10-27 ENCOUNTER — Encounter: Payer: Self-pay | Admitting: Cardiology

## 2011-10-27 ENCOUNTER — Ambulatory Visit (INDEPENDENT_AMBULATORY_CARE_PROVIDER_SITE_OTHER): Payer: Medicare Other | Admitting: Cardiology

## 2011-10-27 VITALS — BP 128/80 | HR 64 | Ht 70.0 in | Wt 188.1 lb

## 2011-10-27 DIAGNOSIS — R609 Edema, unspecified: Secondary | ICD-10-CM

## 2011-10-27 DIAGNOSIS — I2510 Atherosclerotic heart disease of native coronary artery without angina pectoris: Secondary | ICD-10-CM

## 2011-10-27 DIAGNOSIS — I10 Essential (primary) hypertension: Secondary | ICD-10-CM

## 2011-10-27 DIAGNOSIS — E039 Hypothyroidism, unspecified: Secondary | ICD-10-CM

## 2011-10-27 MED ORDER — FUROSEMIDE 40 MG PO TABS: 40.0000 mg | ORAL_TABLET | Freq: Every day | ORAL | Status: DC | PRN

## 2011-10-27 MED ORDER — SIMVASTATIN 20 MG PO TABS: 20.0000 mg | ORAL_TABLET | Freq: Every day | ORAL | Status: DC

## 2011-10-27 NOTE — Patient Instructions (Addendum)
Restart the Lasix (furosemide) 40 mg daily as needed for fluid/swelling  Your physician wants you to follow-up in: 4 months You will receive a reminder letter in the mail two months in advance. If you don't receive a letter, please call our office to schedule the follow-up appointment.

## 2011-10-27 NOTE — Assessment & Plan Note (Signed)
The patient has not been experiencing any recurrent angina pectoris. 

## 2011-10-27 NOTE — Assessment & Plan Note (Signed)
The blood pressure is remaining stable on current therapy.  Patient is not having a chest pain or shortness of breath or dizziness or palpitations.  His electrocardiogram today is normal

## 2011-10-27 NOTE — Assessment & Plan Note (Signed)
Patient has a history of hypothyroidism.  He is on Synthroid.  He is also on statins.  He takes simvastatin 20 mg daily.  His lipids and thyroid functions are followed by Dr. Frederik Pear, his primary care provider

## 2011-10-27 NOTE — Progress Notes (Signed)
Dean Cole Date of Birth:  1951/03/03 [facility] [address] [place], [place]  [address] [phone]         Fax   [phone]  History of Present Illness: This pleasant 61 year old gentleman is seen back for a scheduled followup office visit.  He has a history of ischemic heart disease and underwent coronary artery bypass grafting x4 on 07/10/11 by Dr. Zenaida Niece trichomoniasis.  He had severe multivessel coronary artery disease.  Patient also has a history of high blood pressure, diabetes, hypothyroidism, and mild renal insufficiency.  Since his heart surgery in March the patient was readmitted to the hospital 6 weeks later with acute cholecystitis and underwent cholecystectomy by Dr. Lindie Spruce.  Since then the patient has been feeling well except for problems with fluid retention.  He has noted increased ankle edema and his fingers have been swollen and his readings have been tight.  His weight is up about 10 pounds  Current Outpatient Prescriptions  Medication Sig Dispense Refill  . aspirin EC 325 MG EC tablet Take 1 tablet (325 mg total) by mouth daily.  30 tablet    . diazepam (VALIUM) 5 MG tablet Take 5 mg by mouth 4 (four) times daily. For nerves      . imipramine (TOFRANIL) 25 MG tablet Take 75 mg by mouth at bedtime.      Marland Kitchen levothyroxine (SYNTHROID, LEVOTHROID) 150 MCG tablet Take 150 mcg by mouth daily.      . metoprolol tartrate (LOPRESSOR) 25 MG tablet Take 25 mg by mouth 2 (two) times daily.      . polyethylene glycol (MIRALAX / GLYCOLAX) packet Take 17 g by mouth daily. As needed      . simvastatin (ZOCOR) 20 MG tablet Take 1 tablet (20 mg total) by mouth daily at 6 PM.  30 tablet  5  . DISCONTD: simvastatin (ZOCOR) 20 MG tablet Take 1 tablet (20 mg total) by mouth daily at 6 PM.  30 tablet  5  . furosemide (LASIX) 40 MG tablet Take 1 tablet (40 mg total) by mouth daily as needed.  30 tablet  1  . NITROSTAT 0.4 MG SL tablet       . Oxycodone HCl 10 MG TABS        . DISCONTD: furosemide (LASIX) 40 MG tablet         No Known Allergies  Patient Active Problem List  Diagnosis  . HTN (hypertension)  . Hypothyroidism  . Coronary artery disease  . Borderline diabetes  . Cancer  . Headache  . Unstable angina  . Restless leg syndrome  . PSVT (paroxysmal supraventricular tachycardia)  . Acute cholecystitis  . Postop check    History  Smoking status  . Former Smoker  . Quit date: 08/08/1981  Smokeless tobacco  . Former Neurosurgeon    History  Alcohol Use No    "mixed drink every now and then"    Family History  Problem Relation Age of Onset  . Coronary artery disease Father   . Coronary artery disease Paternal Grandfather     Review of Systems: Constitutional: no fever chills diaphoresis or fatigue or change in weight.  Head and neck: no hearing loss, no epistaxis, no photophobia or visual disturbance. Respiratory: No cough, shortness of breath or wheezing. Cardiovascular: No chest pain peripheral edema, palpitations. Gastrointestinal: No abdominal distention, no abdominal pain, no change in bowel habits hematochezia or melena. Genitourinary: No dysuria, no frequency, no urgency, no nocturia. Musculoskeletal:No  arthralgias, no back pain, no gait disturbance or myalgias. Neurological: No dizziness, no headaches, no numbness, no seizures, no syncope, no weakness, no tremors. Hematologic: No lymphadenopathy, no easy bruising. Psychiatric: No confusion, no hallucinations, no sleep disturbance.    Physical Exam: Filed Vitals:   10/27/11 1008  BP: 128/80  Pulse: 64   the general appearance reveals a well-developed well-nourished gentleman in no distress.The head and neck exam reveals pupils equal and reactive.  Extraocular movements are full.  There is no scleral icterus.  The mouth and pharynx are normal.  The neck is supple.  The carotids reveal no bruits.  The jugular venous pressure is normal.  The  thyroid is not enlarged.  There  is no lymphadenopathy.  The chest is clear to percussion and auscultation.  There are no rales or rhonchi.  Expansion of the chest is symmetrical.  The precordium is quiet.  The first heart sound is normal.  The second heart sound is physiologically split.  There is no murmur gallop rub or click.  There is no abnormal lift or heave.  The abdomen is soft and nontender.  The bowel sounds are normal.  The liver and spleen are not enlarged.  There are no abdominal masses.  There are no abdominal bruits.  Extremities reveal good pedal pulses.  There is no phlebitis.  There is mild edema of the hands and feet.  There is no cyanosis or clubbing.  Strength is normal and symmetrical in all extremities.  There is no lateralizing weakness.  There are no sensory deficits.  The skin is warm and dry.  There is no rash.   EKG shows normal sinus rhythm and is within normal limits  Assessment / Plan:  The patient had some fluid retention problems in the hospital and was on furosemide prior to discharge but not since discharge.  He is starting to retain fluid and we will prescribe Lasix 40 mg that he'll take 1 daily when necessary for edema.  Once he is back to dry weight he will stop the Lasix and just use it when necessary.

## 2011-12-03 ENCOUNTER — Encounter (HOSPITAL_BASED_OUTPATIENT_CLINIC_OR_DEPARTMENT_OTHER): Payer: Self-pay | Admitting: *Deleted

## 2011-12-03 ENCOUNTER — Emergency Department (HOSPITAL_BASED_OUTPATIENT_CLINIC_OR_DEPARTMENT_OTHER)
Admission: EM | Admit: 2011-12-03 | Discharge: 2011-12-03 | Disposition: A | Discharge status: Home / Self Care | Payer: Medicare Other | Attending: Emergency Medicine | Admitting: Emergency Medicine

## 2011-12-03 DIAGNOSIS — L237 Allergic contact dermatitis due to plants, except food: Secondary | ICD-10-CM

## 2011-12-03 DIAGNOSIS — I10 Essential (primary) hypertension: Secondary | ICD-10-CM | POA: Insufficient documentation

## 2011-12-03 DIAGNOSIS — E039 Hypothyroidism, unspecified: Secondary | ICD-10-CM | POA: Insufficient documentation

## 2011-12-03 DIAGNOSIS — G2581 Restless legs syndrome: Secondary | ICD-10-CM | POA: Insufficient documentation

## 2011-12-03 DIAGNOSIS — Z87891 Personal history of nicotine dependence: Secondary | ICD-10-CM | POA: Insufficient documentation

## 2011-12-03 DIAGNOSIS — I2510 Atherosclerotic heart disease of native coronary artery without angina pectoris: Secondary | ICD-10-CM | POA: Insufficient documentation

## 2011-12-03 DIAGNOSIS — Z85828 Personal history of other malignant neoplasm of skin: Secondary | ICD-10-CM | POA: Insufficient documentation

## 2011-12-03 DIAGNOSIS — L255 Unspecified contact dermatitis due to plants, except food: Secondary | ICD-10-CM | POA: Insufficient documentation

## 2011-12-03 MED ORDER — PREDNISONE (PAK) 10 MG PO TABS: 10.0000 mg | ORAL_TABLET | Freq: Every day | ORAL | Status: AC

## 2011-12-03 NOTE — Discharge Instructions (Signed)
Poison Ivy Poison ivy is a rash caused by touching the leaves of the poison ivy plant. The rash often shows up 48 hours later. You might just have bumps, redness, and itching. Sometimes, blisters appear and break open. Your eyes may get puffy (swollen). Poison ivy often heals in 2 to 3 weeks without treatment. HOME CARE  If you touch poison ivy:   Wash your skin with soap and water right away. Wash under your fingernails. Do not rub the skin very hard.   Wash any clothes you were wearing.   Avoid poison ivy in the future. Poison ivy has 3 leaves on a stem.   Use medicine to help with itching as told by your doctor. Do not drive when you take this medicine.   Keep open sores dry, clean, and covered with a bandage and medicated cream, if needed.   Ask your doctor about medicine for children.  GET HELP RIGHT AWAY IF:  You have open sores.   Redness spreads beyond the area of the rash.   There is yellowish white fluid (pus) coming from the rash.   Pain gets worse.   You have a temperature by mouth above 102 F (38.9 C), not controlled by medicine.  MAKE SURE YOU:  Understand these instructions.   Will watch your condition.   Will get help right away if you are not doing well or get worse.  Document Released: 05/20/2010 Document Revised: 04/06/2011 Document Reviewed: 05/20/2010 [employer] Patient Information 2012 [employer]. 

## 2011-12-03 NOTE — ED Provider Notes (Signed)
History     CSN: [id number]  Arrival date & time 12/03/11  2007   First MD Initiated Contact with Patient 12/03/11 2027      Chief Complaint  Patient presents with  . Rash    (Consider location/radiation/quality/duration/timing/severity/associated sxs/prior treatment) Patient is a 61 y.o. male presenting with rash. The history is provided by the patient. No language interpreter was used.  Rash  This is a new problem. The current episode started more than 1 week ago. The problem has been gradually worsening. The problem is associated with plant contact. There has been no fever. Affected Location: all 4 extremities. The patient is experiencing no pain. The pain has been constant since onset. Associated symptoms include itching.    Past Medical History  Diagnosis Date  . HTN (hypertension)   . Hypothyroidism   . Borderline diabetes   . Coronary artery disease   . Angina   . Shortness of breath   . Cancer     skin cancer  basel cell carcinoma  . Headache   . Restless leg syndrome   . Cholecystitis   . Blood transfusion     "no reaction to transfusions"  . Anxiety     Past Surgical History  Procedure Date  . Cardiac catheterization 07/07/2011  . Coronary artery bypass graft 07/10/2011    Procedure: CORONARY ARTERY BYPASS GRAFTING (CABG);  Surgeon: Kerin Perna, MD;  Location: [facility] OR;  Service: Open Heart Surgery;  Laterality: N/A;  . Excisional hemorrhoidectomy   . Cholecystectomy 09/14/2011    Procedure: LAPAROSCOPIC CHOLECYSTECTOMY WITH INTRAOPERATIVE CHOLANGIOGRAM;  Surgeon: Cherylynn Ridges, MD;  Location: [facility] OR;  Service: General;  Laterality: N/A;    Family History  Problem Relation Age of Onset  . Coronary artery disease Father   . Coronary artery disease Paternal Grandfather     History  Substance Use Topics  . Smoking status: Former Smoker    Quit date: 08/08/1981  . Smokeless tobacco: Former Neurosurgeon  . Alcohol Use: No     former"      Review of Systems    Constitutional: Negative.   Respiratory: Negative.   Cardiovascular: Negative.   Skin: Positive for itching and rash.    Allergies  Review of patient's allergies indicates no known allergies.  Home Medications   Current Outpatient Rx  Name Route Sig Dispense Refill  . ASPIRIN 325 MG PO TBEC Oral Take 1 tablet (325 mg total) by mouth daily. 30 tablet   . DIAZEPAM 5 MG PO TABS Oral Take 5 mg by mouth 4 (four) times daily. For nerves    . FUROSEMIDE 40 MG PO TABS Oral Take 1 tablet (40 mg total) by mouth daily as needed. 30 tablet 1  . IMIPRAMINE HCL 25 MG PO TABS Oral Take 75 mg by mouth at bedtime.    Marland Kitchen LEVOTHYROXINE SODIUM 150 MCG PO TABS Oral Take 150 mcg by mouth daily.    Marland Kitchen METOPROLOL TARTRATE 25 MG PO TABS Oral Take 25 mg by mouth 2 (two) times daily.    Marland Kitchen NITROSTAT 0.4 MG SL SUBL      . OXYCODONE HCL 10 MG PO TABS      . POLYETHYLENE GLYCOL 3350 PO PACK Oral Take 17 g by mouth daily. As needed    . SIMVASTATIN 20 MG PO TABS Oral Take 1 tablet (20 mg total) by mouth daily at 6 PM. 30 tablet 5    BP 130/77  Pulse 69  Temp 98.1 F (  36.7 C) (Oral)  Resp 16  Ht 5\' 10"  (1.778 m)  Wt 175 lb (79.379 kg)  BMI 25.11 kg/m2  SpO2 99%  Physical Exam  Constitutional: He appears well-developed and well-nourished.  Cardiovascular: Normal rate and regular rhythm.   Pulmonary/Chest: Effort normal and breath sounds normal.  Skin:       Pt has red raised papules to all extremities:no drainage noted    ED Course  Procedures (including critical care time)  Labs Reviewed - No data to display No results found.   1. Poison ivy       MDM  Pt given a steroid pack        Teressa Lower, NP 12/03/11 2048

## 2011-12-03 NOTE — ED Notes (Signed)
Pt has rash to extremities x 4 x 1 week, getting worse

## 2011-12-03 NOTE — ED Provider Notes (Signed)
History/physical exam/procedure(s) were performed by non-physician practitioner and as supervising physician I was immediately available for consultation/collaboration. I have reviewed all notes and am in agreement with care and plan.   Edna Rede S Euriah Matlack, MD 12/03/11 2342 

## 2011-12-04 NOTE — ED Notes (Signed)
Pts wife called with several questions re: pts rx.  She had taken rx to [facility] [address] and they had indicated need to clarify rx as written.  Rx was changed by Dr Ranae Palms to prednisone 40mg  4 tablets x5days total of 20 tablets. Pts wife is extremely irrate about rx change and stated "I feel like someone has made a big mistake"  I attempted to reassure her that new prescription is a valid prescription and should work as well.  Pts wife became increasingly more irrate and is very concerned about the fact that "a doctor who did not see her husband is the one prescribing the medication"  I again attempted to reassure her about the prescription and she remained upset.  I explained to her that the pt needs to take the prescription as directed and to follow up with their PMD if sx remain.  I further gave her the name and number for the Dept Director if she felt the need for additional follow-up.

## 2012-02-29 ENCOUNTER — Ambulatory Visit (INDEPENDENT_AMBULATORY_CARE_PROVIDER_SITE_OTHER): Payer: Medicare Other | Admitting: Cardiology

## 2012-02-29 ENCOUNTER — Encounter: Payer: Self-pay | Admitting: Cardiology

## 2012-02-29 VITALS — BP 140/80 | HR 80 | Resp 18 | Ht 70.0 in | Wt 199.8 lb

## 2012-02-29 DIAGNOSIS — I2510 Atherosclerotic heart disease of native coronary artery without angina pectoris: Secondary | ICD-10-CM

## 2012-02-29 DIAGNOSIS — E785 Hyperlipidemia, unspecified: Secondary | ICD-10-CM | POA: Insufficient documentation

## 2012-02-29 DIAGNOSIS — Z951 Presence of aortocoronary bypass graft: Secondary | ICD-10-CM

## 2012-02-29 DIAGNOSIS — I10 Essential (primary) hypertension: Secondary | ICD-10-CM

## 2012-02-29 DIAGNOSIS — E7800 Pure hypercholesterolemia, unspecified: Secondary | ICD-10-CM

## 2012-02-29 NOTE — Patient Instructions (Addendum)
Your physician recommends that you continue on your current medications as directed. Please refer to the Current Medication list given to you today.  Your physician wants you to follow-up in: 6 months ov/ekg You will receive a reminder letter in the mail two months in advance. If you don't receive a letter, please call our office to schedule the follow-up appointment.   Work harder on weight loss and diet

## 2012-02-29 NOTE — Assessment & Plan Note (Signed)
The patient has been working full-time in Nurse, mental health work.  He has not been expressing any chest pain or shortness of breath.

## 2012-02-29 NOTE — Assessment & Plan Note (Signed)
Blood pressure is remaining stable on current therapy.  He does complain of feeling cold.  His thyroid function is followed by Dr. Chilton Si and he is on Synthroid 150 mcg daily.  His beta blockers may also be contributing to feeling cold.

## 2012-02-29 NOTE — Assessment & Plan Note (Signed)
The patient is on simvastatin 20 mg daily.  His lipids are followed by Dr. Chilton Si.  He states that his triglycerides have been high.  We have encouraged him to be on a low carbohydrate diet and gave him diet instructions concerning that

## 2012-02-29 NOTE — Progress Notes (Signed)
Dean Cole Date of Birth:  [birth year] [facility] [address] [place], [place]  [address] [phone]         Fax   [phone]  History of Present Illness: This pleasant 61 year old gentleman is seen back for a scheduled followup office visit. He has a history of ischemic heart disease and underwent coronary artery bypass grafting x4 on 07/10/11 by Dr. Donata Clay.  He had severe multivessel coronary artery disease. Patient also has a history of high blood pressure, diabetes, hypothyroidism, and mild renal insufficiency. Since his heart surgery in March the patient was readmitted to the hospital 6 weeks later with acute cholecystitis and underwent cholecystectomy by Dr. Lindie Spruce. Since then the patient has been feeling well except for problems with mild fluid retention. He has noted occasional ankle edema.  Since last visit he has gained 11 more pounds.  Previously he had been chewing tobacco but he has not used tobacco since his bypass surgery.  He does not drink any alcohol.   Current Outpatient Prescriptions  Medication Sig Dispense Refill  . aspirin EC 325 MG EC tablet Take 1 tablet (325 mg total) by mouth daily.  30 tablet    . diazepam (VALIUM) 5 MG tablet Take 5 mg by mouth 4 (four) times daily. For nerves      . furosemide (LASIX) 40 MG tablet Take 1 tablet (40 mg total) by mouth daily as needed.  30 tablet  1  . imipramine (TOFRANIL) 25 MG tablet Take 75 mg by mouth at bedtime.      Marland Kitchen levothyroxine (SYNTHROID, LEVOTHROID) 150 MCG tablet Take 150 mcg by mouth daily.      . metoprolol tartrate (LOPRESSOR) 25 MG tablet Take 25 mg by mouth 2 (two) times daily.      Marland Kitchen NITROSTAT 0.4 MG SL tablet       . Oxycodone HCl 10 MG TABS       . polyethylene glycol (MIRALAX / GLYCOLAX) packet Take 17 g by mouth daily. As needed      . simvastatin (ZOCOR) 20 MG tablet Take 1 tablet (20 mg total) by mouth daily at 6 PM.  30 tablet  5    No Known Allergies  Patient  Active Problem List  Diagnosis  . HTN (hypertension)  . Hypothyroidism  . Coronary artery disease  . Borderline diabetes  . Cancer  . Headache  . Unstable angina  . Restless leg syndrome  . PSVT (paroxysmal supraventricular tachycardia)  . Acute cholecystitis  . Postop check    History  Smoking status  . Former Smoker  . Quit date: 08/08/1981  Smokeless tobacco  . Former Neurosurgeon    History  Alcohol Use No    former"    Family History  Problem Relation Age of Onset  . Coronary artery disease Father   . Coronary artery disease Paternal Grandfather     Review of Systems: Constitutional: no fever chills diaphoresis or fatigue or change in weight.  Head and neck: no hearing loss, no epistaxis, no photophobia or visual disturbance. Respiratory: No cough, shortness of breath or wheezing. Cardiovascular: No chest pain peripheral edema, palpitations. Gastrointestinal: No abdominal distention, no abdominal pain, no change in bowel habits hematochezia or melena. Genitourinary: No dysuria, no frequency, no urgency, no nocturia. Musculoskeletal:No arthralgias, no back pain, no gait disturbance or myalgias. Neurological: No dizziness, no headaches, no numbness, no seizures, no syncope, no weakness, no tremors. Hematologic: No lymphadenopathy, no easy bruising. Psychiatric: No  confusion, no hallucinations, no sleep disturbance.    Physical Exam: Filed Vitals:   02/29/12 1050  BP: 140/80  Pulse: 80  Resp: 18   the general appearance reveals a well-developed well-nourished gentleman in no distress.The head and neck exam reveals pupils equal and reactive.  Extraocular movements are full.  There is no scleral icterus.  The mouth and pharynx are normal.  The neck is supple.  The carotids reveal no bruits.  The jugular venous pressure is normal.  The  thyroid is not enlarged.  There is no lymphadenopathy.  The chest is clear to percussion and auscultation.  There are no rales or  rhonchi.  Expansion of the chest is symmetrical.  The precordium is quiet.  The first heart sound is normal.  The second heart sound is physiologically split.  There is no murmur gallop rub or click.  There is no abnormal lift or heave.  The abdomen is soft and nontender.  The bowel sounds are normal.  The liver and spleen are not enlarged.  There are no abdominal masses.  There are no abdominal bruits.  Extremities reveal good pedal pulses.  There is no phlebitis or edema.  There is no cyanosis or clubbing.  Strength is normal and symmetrical in all extremities.  There is no lateralizing weakness.  There are no sensory deficits.  The skin is warm and dry.  There is no rash.     Assessment / Plan: Continue on same medication.  Start low carbohydrate diet to lose weight and help control triglycerides.  Recheck 6 months for followup office visit and EKG

## 2012-04-01 ENCOUNTER — Observation Stay (HOSPITAL_COMMUNITY)
Admission: EM | Admit: 2012-04-01 | Discharge: 2012-04-01 | Disposition: A | Discharge status: Home / Self Care | Payer: Medicare Other | Attending: Emergency Medicine | Admitting: Emergency Medicine

## 2012-04-01 ENCOUNTER — Encounter (HOSPITAL_COMMUNITY): Payer: Self-pay | Admitting: Nurse Practitioner

## 2012-04-01 DIAGNOSIS — I25810 Atherosclerosis of coronary artery bypass graft(s) without angina pectoris: Secondary | ICD-10-CM | POA: Insufficient documentation

## 2012-04-01 DIAGNOSIS — Z87891 Personal history of nicotine dependence: Secondary | ICD-10-CM | POA: Insufficient documentation

## 2012-04-01 DIAGNOSIS — I10 Essential (primary) hypertension: Secondary | ICD-10-CM | POA: Insufficient documentation

## 2012-04-01 DIAGNOSIS — L03119 Cellulitis of unspecified part of limb: Secondary | ICD-10-CM | POA: Insufficient documentation

## 2012-04-01 DIAGNOSIS — Z79899 Other long term (current) drug therapy: Secondary | ICD-10-CM | POA: Insufficient documentation

## 2012-04-01 DIAGNOSIS — L03115 Cellulitis of right lower limb: Secondary | ICD-10-CM

## 2012-04-01 DIAGNOSIS — F411 Generalized anxiety disorder: Secondary | ICD-10-CM | POA: Insufficient documentation

## 2012-04-01 DIAGNOSIS — E039 Hypothyroidism, unspecified: Secondary | ICD-10-CM | POA: Insufficient documentation

## 2012-04-01 DIAGNOSIS — M79609 Pain in unspecified limb: Secondary | ICD-10-CM

## 2012-04-01 DIAGNOSIS — M7989 Other specified soft tissue disorders: Secondary | ICD-10-CM

## 2012-04-01 DIAGNOSIS — L02619 Cutaneous abscess of unspecified foot: Principal | ICD-10-CM | POA: Insufficient documentation

## 2012-04-01 LAB — BASIC METABOLIC PANEL
CO2: 30 mEq/L (ref 19–32)
Calcium: 9.1 mg/dL (ref 8.4–10.5)
Creatinine, Ser: 1.17 mg/dL (ref 0.50–1.35)
Glucose, Bld: 119 mg/dL — ABNORMAL HIGH (ref 70–99)

## 2012-04-01 LAB — CBC
Hemoglobin: 14.3 g/dL (ref 13.0–17.0)
MCH: 31.8 pg (ref 26.0–34.0)
MCV: 90 fL (ref 78.0–100.0)
RBC: 4.5 MIL/uL (ref 4.22–5.81)

## 2012-04-01 MED ORDER — NON FORMULARY: 10.0000 mg | Freq: Every evening | Status: DC | PRN

## 2012-04-01 MED ORDER — ZOLPIDEM TARTRATE 5 MG PO TABS
10.0000 mg | ORAL_TABLET | Freq: Every evening | ORAL | Status: DC | PRN
Start: 2012-04-01 — End: 2012-04-02

## 2012-04-01 MED ORDER — VANCOMYCIN HCL IN DEXTROSE 1-5 GM/200ML-% IV SOLN
1000.0000 mg | Freq: Two times a day (BID) | INTRAVENOUS | Status: DC
  Administered 2012-04-01: 1000 mg via INTRAVENOUS
  Filled 2012-04-01: qty 200

## 2012-04-01 MED ORDER — CEFAZOLIN SODIUM 1-5 GM-% IV SOLN
1.0000 g | Freq: Four times a day (QID) | INTRAVENOUS | Status: DC
  Administered 2012-04-01: 1 g via INTRAVENOUS
  Filled 2012-04-01: qty 50

## 2012-04-01 MED ORDER — OXYCODONE-ACETAMINOPHEN 5-325 MG PO TABS
1.0000 | ORAL_TABLET | ORAL | Status: DC | PRN
  Administered 2012-04-01: 2 via ORAL
  Filled 2012-04-01: qty 2

## 2012-04-01 MED ORDER — OXYCODONE-ACETAMINOPHEN 5-325 MG PO TABS: 1.0000 | ORAL_TABLET | ORAL | Status: DC | PRN

## 2012-04-01 MED ORDER — CEFAZOLIN SODIUM 1-5 GM-% IV SOLN
1.0000 g | Freq: Three times a day (TID) | INTRAVENOUS | Status: DC
  Administered 2012-04-01: 1 g via INTRAVENOUS
  Filled 2012-04-01: qty 50

## 2012-04-01 MED ORDER — ACETAMINOPHEN 325 MG PO TABS: 650.0000 mg | ORAL_TABLET | ORAL | Status: DC | PRN

## 2012-04-01 MED ORDER — DOXYCYCLINE HYCLATE 100 MG PO CAPS: 100.0000 mg | ORAL_CAPSULE | Freq: Two times a day (BID) | ORAL | Status: DC

## 2012-04-01 NOTE — ED Notes (Signed)
Right foot swollen, red, hot and painful x 2 days.

## 2012-04-01 NOTE — ED Notes (Signed)
Report called to CDU. Nurse informed lab at bedside will be transporting pt over to CDU after lab finished.

## 2012-04-01 NOTE — ED Notes (Signed)
Pt states pain started Sunday followed by swelling then redness; pt states pain, swelling and redness on right foot has gradually gotten worse since starting; pt denies injury to area. pt mentating appropriately.

## 2012-04-01 NOTE — ED Notes (Signed)
Dr Ray at bedside. 

## 2012-04-01 NOTE — ED Notes (Signed)
Idalia Needle, EMT transporting pt to CDU6

## 2012-04-01 NOTE — ED Provider Notes (Signed)
Dean Cole is a 61 y.o. male transferred to CDU from fast track. Signout from Dr. Rosalia Hammers as follows: Patient has had right foot pain and swelling for 2 days. Transferred to CDU on cellulitis protocol.  Plan is to discharge patient after IV antibiotics and Doppler results.  Signout given to PA west at shift change.   Wynetta Emery, PA-C 04/01/12 1557

## 2012-04-01 NOTE — Progress Notes (Signed)
Utilization review completed.  P.J. Kaydon Creedon,RN,BSN Case Manager [phone]  

## 2012-04-01 NOTE — ED Notes (Signed)
Pt reports he woke up 2 days ago and his R foot was hurting. Reports redness and swelling to the foot. Denies any injuries.

## 2012-04-01 NOTE — ED Provider Notes (Signed)
3:51 PM Pt is in CDU under observation, cellulitis protocol.  Sign out received from [facility], PA-C.  Pt sent from Triage by Dr Rosalia Hammers.  DVT study is negative.  Pt has received IV vancomycin and is currently receiving IV ancef.  States pain is 7/10.  Pt reports it feels and looks better than when he came in.  Erythema is within lines, some clearing over inferior aspect.  Discussed pt with Dr Rosalia Hammers.  She would like patient to receive one more dose of ancef prior to discharge.  To be d/c home on doxycycline or bactrim.    8:46 PM Continued improvement of right foot.  Plan is for d/c home with doxycycline and percocet.  Pt to return in 48 hours for recheck (may also go to PCP Murray Hodgkins). Pt given return precautions.  Pt verbalizes understanding and agrees with plan.     Results for orders placed during the hospital encounter of 04/01/12  CBC      Component Value Range   WBC 9.6  4.0 - 10.5 K/uL   RBC 4.50  4.22 - 5.81 MIL/uL   Hemoglobin 14.3  13.0 - 17.0 g/dL   HCT 78.2  95.6 - 21.3 %   MCV 90.0  78.0 - 100.0 fL   MCH 31.8  26.0 - 34.0 pg   MCHC 35.3  30.0 - 36.0 g/dL   RDW 08.6  57.8 - 46.9 %   Platelets 130 (*) 150 - 400 K/uL  BASIC METABOLIC PANEL      Component Value Range   Sodium 138  135 - 145 mEq/L   Potassium 3.5  3.5 - 5.1 mEq/L   Chloride 98  96 - 112 mEq/L   CO2 30  19 - 32 mEq/L   Glucose, Bld 119 (*) 70 - 99 mg/dL   BUN 13  6 - 23 mg/dL   Creatinine, Ser 6.29  0.50 - 1.35 mg/dL   Calcium 9.1  8.4 - 52.8 mg/dL   GFR calc non Af Amer 66 (*) >90 mL/min   GFR calc Af Amer 76 (*) >90 mL/min   No results found.    [place], [place] 04/01/12 2047

## 2012-04-01 NOTE — ED Provider Notes (Signed)
History    This chart was scribed for Dean Quarry, MD, MD by Smitty Pluck, ED Scribe. The patient was seen in room TR10C and the patient's care was started at 1:04PM.   CSN: [id number]  Arrival date & time 04/01/12  1153      Chief Complaint  Patient presents with  . Foot Pain    (Consider location/radiation/quality/duration/timing/severity/associated sxs/prior treatment) Patient is a 61 y.o. male presenting with lower extremity pain. The history is provided by the patient. No language interpreter was used.  Foot Pain Pertinent negatives include no shortness of breath.   ROBB SIBAL is a 61 y.o. male who presents to the Emergency Department complaining of constant, moderate right foot pain onset 2 days ago. Pt reports that he has swelling in right foot. Redness started 1 day ago. He had fever and chills. Pt has hx CAD, DM, coronary artery bypass and cholecystectomy. Pt reports taking asa. He denies hx of blood clots.    Past Medical History  Diagnosis Date  . HTN (hypertension)   . Hypothyroidism   . Borderline diabetes   . Coronary artery disease   . Angina   . Shortness of breath   . Cancer     skin cancer  basel cell carcinoma  . Headache   . Restless leg syndrome   . Cholecystitis   . Blood transfusion     "no reaction to transfusions"  . Anxiety     Past Surgical History  Procedure Date  . Cardiac catheterization 07/07/2011  . Coronary artery bypass graft 07/10/2011    Procedure: CORONARY ARTERY BYPASS GRAFTING (CABG);  Surgeon: Kerin Perna, MD;  Location: [facility] OR;  Service: Open Heart Surgery;  Laterality: N/A;  . Excisional hemorrhoidectomy   . Cholecystectomy 09/14/2011    Procedure: LAPAROSCOPIC CHOLECYSTECTOMY WITH INTRAOPERATIVE CHOLANGIOGRAM;  Surgeon: Cherylynn Ridges, MD;  Location: [facility] OR;  Service: General;  Laterality: N/A;    Family History  Problem Relation Age of Onset  . Coronary artery disease Father   . Coronary artery disease Paternal  Grandfather     History  Substance Use Topics  . Smoking status: Former Smoker    Quit date: 08/08/1981  . Smokeless tobacco: Former Neurosurgeon  . Alcohol Use: No     Comment: former"      Review of Systems  Constitutional: Negative for fever and chills.  Respiratory: Negative for shortness of breath.   Gastrointestinal: Negative for nausea and vomiting.  Neurological: Negative for weakness.  All other systems reviewed and are negative.    Allergies  Adhesive  Home Medications   Current Outpatient Rx  Name  Route  Sig  Dispense  Refill  . ASPIRIN 325 MG PO TBEC   Oral   Take 1 tablet (325 mg total) by mouth daily.   30 tablet      . DIAZEPAM 5 MG PO TABS   Oral   Take 5 mg by mouth 4 (four) times daily. For nerves         . FUROSEMIDE 40 MG PO TABS   Oral   Take 1 tablet (40 mg total) by mouth daily as needed.   30 tablet   1   . IMIPRAMINE HCL 25 MG PO TABS   Oral   Take 75 mg by mouth at bedtime.         Marland Kitchen LEVOTHYROXINE SODIUM 150 MCG PO TABS   Oral   Take 150 mcg by mouth daily.         Marland Kitchen  METOPROLOL TARTRATE 25 MG PO TABS   Oral   Take 25 mg by mouth 2 (two) times daily.         Marland Kitchen NITROSTAT 0.4 MG SL SUBL   Sublingual   Place 0.4 mg under the tongue every 5 (five) minutes as needed. For chest pain         . POLYETHYLENE GLYCOL 3350 PO PACK   Oral   Take 17 g by mouth daily. As needed         . SIMVASTATIN 20 MG PO TABS   Oral   Take 1 tablet (20 mg total) by mouth daily at 6 PM.   30 tablet   5     BP 128/85  Pulse 92  Temp 97.4 F (36.3 C) (Oral)  Resp 16  SpO2 99%  Physical Exam  Nursing note and vitals reviewed. Constitutional: He is oriented to person, place, and time. He appears well-developed and well-nourished. No distress.  HENT:  Head: Normocephalic and atraumatic.  Eyes: EOM are normal.  Neck: Neck supple. No tracheal deviation present.  Cardiovascular: Normal rate.   Pulmonary/Chest: Effort normal. No  respiratory distress.  Abdominal: Soft. Bowel sounds are normal.  Genitourinary:       Tenderness right foot; dp intact, toes pink cap refill <2 secs  Musculoskeletal: Normal range of motion.       Swelling of right foot  Redness of right foot   Lymphadenopathy:       Right: Inguinal adenopathy present.  Neurological: He is alert and oriented to person, place, and time.  Skin: Skin is warm and dry.  Psychiatric: He has a normal mood and affect. His behavior is normal.    ED Course  Procedures (including critical care time) DIAGNOSTIC STUDIES: Oxygen Saturation is 99% on room air, normal by my interpretation.    COORDINATION OF CARE: 1:07 PM Discussed ED treatment with pt (Pt will be moved to CDU for further evaluation under PA Joni Reining Pisciotta's care) 1:33 PM Ordered:    . vancomycin  1,000 mg Intravenous Q12H       Labs Reviewed - No data to display No results found.   No diagnosis found.    MDM  I personally performed the services described in this documentation, which was scribed in my presence. The recorded information has been reviewed and considered.  Patient with redness right foot concerning for cellulitis.  Plan cdu cellulitis protocol.  Discussed with Wynetta Emery, PA and she assumes care.   Dean Quarry, MD 04/01/12 [phone]

## 2012-04-01 NOTE — Progress Notes (Signed)
*  PRELIMINARY RESULTS* Vascular Ultrasound Right lower extremity venous duplex has been completed.  Preliminary findings: No evidence of DVT or Baker's cyst in the right leg.  Farrel Demark, RDMS, RVT 04/01/2012, 3:19 PM

## 2012-04-06 NOTE — ED Provider Notes (Signed)
History/physical exam/procedure(s) were performed by non-physician practitioner and as supervising physician I was immediately available for consultation/collaboration. I have reviewed all notes and am in agreement with care and plan.   Damari Hiltz S Soul Hackman, MD 04/06/12 0748 

## 2012-04-06 NOTE — ED Provider Notes (Signed)
History/physical exam/procedure(s) were performed by non-physician practitioner and as supervising physician I was immediately available for consultation/collaboration. I have reviewed all notes and am in agreement with care and plan.   Hilario Quarry, MD 04/06/12 [phone]

## 2012-04-07 LAB — CULTURE, BLOOD (ROUTINE X 2): Culture: NO GROWTH

## 2012-06-12 ENCOUNTER — Other Ambulatory Visit: Payer: Self-pay | Admitting: *Deleted

## 2012-06-12 MED ORDER — FUROSEMIDE 40 MG PO TABS: 40.0000 mg | ORAL_TABLET | Freq: Every day | ORAL | Status: DC | PRN

## 2012-07-07 ENCOUNTER — Other Ambulatory Visit: Payer: Self-pay | Admitting: Physician Assistant

## 2012-08-20 ENCOUNTER — Encounter: Payer: Self-pay | Admitting: Cardiology

## 2012-08-20 ENCOUNTER — Ambulatory Visit (INDEPENDENT_AMBULATORY_CARE_PROVIDER_SITE_OTHER): Payer: Medicare Other | Admitting: Cardiology

## 2012-08-20 VITALS — BP 126/74 | HR 74 | Ht 70.0 in | Wt 207.4 lb

## 2012-08-20 DIAGNOSIS — I2510 Atherosclerotic heart disease of native coronary artery without angina pectoris: Secondary | ICD-10-CM

## 2012-08-20 DIAGNOSIS — I259 Chronic ischemic heart disease, unspecified: Secondary | ICD-10-CM

## 2012-08-20 DIAGNOSIS — E785 Hyperlipidemia, unspecified: Secondary | ICD-10-CM

## 2012-08-20 DIAGNOSIS — I471 Supraventricular tachycardia, unspecified: Secondary | ICD-10-CM

## 2012-08-20 NOTE — Patient Instructions (Addendum)
Your physician recommends that you continue on your current medications as directed. Please refer to the Current Medication list given to you today.  Your physician wants you to follow-up in: 6 months. You will receive a reminder letter in the mail two months in advance. If you don't receive a letter, please call our office to schedule the follow-up appointment.  

## 2012-08-20 NOTE — Progress Notes (Signed)
Dean Cole Date of Birth:  07/09/1950 [facility] [address] [place], [place]  [address] [phone]         Fax   [phone]  History of Present Illness: This pleasant 62 year old gentleman is seen back for a scheduled followup office visit. He has a history of ischemic heart disease and underwent coronary artery bypass grafting x4 on 07/10/11 by Dr. Donata Clay. He had severe multivessel coronary artery disease. Patient also has a history of high blood pressure, diabetes, hypothyroidism, and mild renal insufficiency. Since his heart surgery in March 2013 the patient was readmitted to the hospital 6 weeks later with acute cholecystitis and underwent cholecystectomy by Dr. Lindie Spruce. Since then the patient has been feeling well except for problems with mild fluid retention. He has noted occasional ankle edema. . Previously he had been chewing tobacco but he has not used tobacco since his bypass surgery. He does not drink any alcohol.   Current Outpatient Prescriptions  Medication Sig Dispense Refill  . aspirin EC 325 MG EC tablet Take 1 tablet (325 mg total) by mouth daily.  30 tablet    . diazepam (VALIUM) 5 MG tablet Take 5 mg by mouth 4 (four) times daily. For nerves      . furosemide (LASIX) 40 MG tablet Take 1 tablet (40 mg total) by mouth daily as needed.  30 tablet  1  . imipramine (TOFRANIL) 25 MG tablet Take 75 mg by mouth at bedtime.      Marland Kitchen levothyroxine (SYNTHROID, LEVOTHROID) 150 MCG tablet Take 150 mcg by mouth daily.      . metoprolol tartrate (LOPRESSOR) 25 MG tablet Take 25 mg by mouth 2 (two) times daily.      . metoprolol tartrate (LOPRESSOR) 25 MG tablet TAKE 1 TABLET TWICE A DAY  60 tablet  8  . NITROSTAT 0.4 MG SL tablet Place 0.4 mg under the tongue every 5 (five) minutes as needed. For chest pain      . oxyCODONE-acetaminophen (PERCOCET/ROXICET) 5-325 MG per tablet Take 1 tablet by mouth every 4 (four) hours as needed for pain.  15 tablet  0    . polyethylene glycol (MIRALAX / GLYCOLAX) packet Take 17 g by mouth daily. As needed      . simvastatin (ZOCOR) 20 MG tablet Take 1 tablet (20 mg total) by mouth daily at 6 PM.  30 tablet  5   No current facility-administered medications for this visit.    Allergies  Allergen Reactions  . Adhesive (Tape) Swelling    Patient Active Problem List  Diagnosis  . HTN (hypertension)  . Hypothyroidism  . Coronary artery disease  . Borderline diabetes  . Cancer  . Headache  . Unstable angina  . Restless leg syndrome  . PSVT (paroxysmal supraventricular tachycardia)  . Acute cholecystitis  . Postop check  . Dyslipidemia    History  Smoking status  . Former Smoker  . Quit date: 08/08/1981  Smokeless tobacco  . Former Neurosurgeon    History  Alcohol Use No    Comment: former"    Family History  Problem Relation Age of Onset  . Coronary artery disease Father   . Coronary artery disease Paternal Grandfather     Review of Systems: Constitutional: no fever chills diaphoresis or fatigue or change in weight.  Head and neck: no hearing loss, no epistaxis, no photophobia or visual disturbance. Respiratory: No cough, shortness of breath or wheezing. Cardiovascular: No chest pain peripheral edema,  palpitations. Gastrointestinal: No abdominal distention, no abdominal pain, no change in bowel habits hematochezia or melena. Genitourinary: No dysuria, no frequency, no urgency, no nocturia. Musculoskeletal:No arthralgias, no back pain, no gait disturbance or myalgias. Neurological: No dizziness, no headaches, no numbness, no seizures, no syncope, no weakness, no tremors. Hematologic: No lymphadenopathy, no easy bruising. Psychiatric: No confusion, no hallucinations, no sleep disturbance.    Physical Exam: Filed Vitals:   08/20/12 1353  BP: 126/74  Pulse: 74   the general appearance reveals a well-developed well-nourished elderly gentleman in no distress.  He has gained 8 more pounds  since last visit.  He intends to lose the weight this summer when he is more physically active.The head and neck exam reveals pupils equal and reactive.  Extraocular movements are full.  There is no scleral icterus.  The mouth and pharynx are normal.  The neck is supple.  The carotids reveal no bruits.  The jugular venous pressure is normal.  The  thyroid is not enlarged.  There is no lymphadenopathy.  The chest is clear to percussion and auscultation.  There are no rales or rhonchi.  Expansion of the chest is symmetrical.  The precordium is quiet.  The first heart sound is normal.  The second heart sound is physiologically split.  There is no murmur gallop rub or click.  There is no abnormal lift or heave.  The abdomen is soft and nontender.  The bowel sounds are normal.  The liver and spleen are not enlarged.  There are no abdominal masses.  There are no abdominal bruits.  Extremities reveal good pedal pulses.  There is no phlebitis or edema.  There is no cyanosis or clubbing.  Strength is normal and symmetrical in all extremities.  There is no lateralizing weakness.  There are no sensory deficits.  The skin is warm and dry.  There is no rash.  EKG shows normal sinus rhythm and is within normal limits.   Assessment / Plan: Continue same medication.  Check in 6 months for followup office visit.  Work harder on diet and weight loss.

## 2012-08-20 NOTE — Assessment & Plan Note (Signed)
The patient has not been aware of any subsequent palpitations or tachycardia.

## 2012-08-20 NOTE — Assessment & Plan Note (Signed)
The patient has not been experiencing any exertional chest pain or angina pectoris.

## 2012-08-20 NOTE — Assessment & Plan Note (Signed)
The patient has a past history of dyslipidemia.  His lipids are followed by Dr. Chilton Si.  He is presently on simvastatin 20 mg daily is not having any side effects

## 2012-09-19 ENCOUNTER — Other Ambulatory Visit: Payer: Self-pay | Admitting: *Deleted

## 2012-09-19 MED ORDER — DIAZEPAM 5 MG PO TABS: ORAL_TABLET | ORAL | Status: DC

## 2012-10-12 ENCOUNTER — Other Ambulatory Visit: Payer: Self-pay | Admitting: Internal Medicine

## 2012-12-20 ENCOUNTER — Other Ambulatory Visit: Payer: Self-pay | Admitting: *Deleted

## 2012-12-20 DIAGNOSIS — E039 Hypothyroidism, unspecified: Secondary | ICD-10-CM

## 2012-12-20 DIAGNOSIS — I10 Essential (primary) hypertension: Secondary | ICD-10-CM

## 2012-12-20 DIAGNOSIS — R7309 Other abnormal glucose: Secondary | ICD-10-CM

## 2012-12-23 ENCOUNTER — Other Ambulatory Visit: Payer: Medicare Other

## 2012-12-23 DIAGNOSIS — I10 Essential (primary) hypertension: Secondary | ICD-10-CM

## 2012-12-23 DIAGNOSIS — R7309 Other abnormal glucose: Secondary | ICD-10-CM

## 2012-12-23 DIAGNOSIS — E039 Hypothyroidism, unspecified: Secondary | ICD-10-CM

## 2012-12-24 LAB — BASIC METABOLIC PANEL
CO2: 27 mmol/L (ref 18–29)
Calcium: 9.3 mg/dL (ref 8.6–10.2)
Glucose: 100 mg/dL — ABNORMAL HIGH (ref 65–99)
Potassium: 4.1 mmol/L (ref 3.5–5.2)
Sodium: 142 mmol/L (ref 134–144)

## 2012-12-24 LAB — LIPID PANEL
Chol/HDL Ratio: 4.4 ratio units (ref 0.0–5.0)
HDL: 29 mg/dL — ABNORMAL LOW (ref >39)
LDL Calculated: 43 mg/dL (ref 0–99)
Triglycerides: 280 mg/dL — ABNORMAL HIGH (ref 0–149)

## 2012-12-25 ENCOUNTER — Encounter: Payer: Self-pay | Admitting: Internal Medicine

## 2012-12-25 ENCOUNTER — Ambulatory Visit (INDEPENDENT_AMBULATORY_CARE_PROVIDER_SITE_OTHER): Payer: Medicare Other | Admitting: Internal Medicine

## 2012-12-25 VITALS — BP 120/80 | HR 82 | Temp 98.8°F | Resp 18 | Ht 70.0 in | Wt 204.4 lb

## 2012-12-25 DIAGNOSIS — L309 Dermatitis, unspecified: Secondary | ICD-10-CM

## 2012-12-25 DIAGNOSIS — E039 Hypothyroidism, unspecified: Secondary | ICD-10-CM

## 2012-12-25 DIAGNOSIS — F411 Generalized anxiety disorder: Secondary | ICD-10-CM

## 2012-12-25 DIAGNOSIS — I10 Essential (primary) hypertension: Secondary | ICD-10-CM

## 2012-12-25 DIAGNOSIS — R609 Edema, unspecified: Secondary | ICD-10-CM

## 2012-12-25 DIAGNOSIS — I2510 Atherosclerotic heart disease of native coronary artery without angina pectoris: Secondary | ICD-10-CM

## 2012-12-25 DIAGNOSIS — E785 Hyperlipidemia, unspecified: Secondary | ICD-10-CM

## 2012-12-25 DIAGNOSIS — L259 Unspecified contact dermatitis, unspecified cause: Secondary | ICD-10-CM

## 2012-12-25 DIAGNOSIS — R7309 Other abnormal glucose: Secondary | ICD-10-CM

## 2012-12-25 DIAGNOSIS — F419 Anxiety disorder, unspecified: Secondary | ICD-10-CM | POA: Insufficient documentation

## 2012-12-25 DIAGNOSIS — R7303 Prediabetes: Secondary | ICD-10-CM

## 2012-12-25 MED ORDER — MOMETASONE FUROATE 0.1 % EX CREA: TOPICAL_CREAM | CUTANEOUS | Status: DC

## 2012-12-25 MED ORDER — FUROSEMIDE 40 MG PO TABS: 40.0000 mg | ORAL_TABLET | Freq: Every day | ORAL | Status: DC | PRN

## 2012-12-25 NOTE — Progress Notes (Signed)
Subjective:    Patient ID: Dean Cole, male    DOB: November 19, 1950, 62 y.o.   MRN: [id number]  HPI HTN (hypertension): Controlled  Hypothyroidism: Controlled  Coronary artery disease: Palpitations, dyspnea  Borderline diabetes: Controlled  Eczema: Itchy skin was broken areas  Dyslipidemia : Controlled  Anxiety: Controlled  Edema: Worse than usual     Current Outpatient Prescriptions on File Prior to Visit  Medication Sig Dispense Refill  . aspirin EC 325 MG EC tablet Take 1 tablet (325 mg total) by mouth daily.  30 tablet    . diazepam (VALIUM) 5 MG tablet Take one tablet 4 times a day for nerves  120 tablet  4  . furosemide (LASIX) 40 MG tablet Take 1 tablet (40 mg total) by mouth daily as needed.  30 tablet  1  . imipramine (TOFRANIL) 25 MG tablet Take 75 mg by mouth at bedtime.      Marland Kitchen levothyroxine (SYNTHROID, LEVOTHROID) 150 MCG tablet Take 150 mcg by mouth daily.      . metoprolol tartrate (LOPRESSOR) 25 MG tablet TAKE 1 TABLET TWICE A DAY  60 tablet  8  . NITROSTAT 0.4 MG SL tablet Place 0.4 mg under the tongue every 5 (five) minutes as needed. For chest pain      . oxyCODONE-acetaminophen (PERCOCET/ROXICET) 5-325 MG per tablet Take 1 tablet by mouth every 4 (four) hours as needed for pain.  15 tablet  0  . polyethylene glycol (MIRALAX / GLYCOLAX) packet Take 17 g by mouth daily. As needed      . simvastatin (ZOCOR) 20 MG tablet TAKE 1 TABLET EVERY DAY TO LOWER CHOLESTROL  30 tablet  4   No current facility-administered medications on file prior to visit.    Review of Systems  Constitutional: Positive for fatigue. Negative for fever, chills, activity change, appetite change and unexpected weight change.  HENT: Positive for hearing loss. Negative for ear pain and tinnitus.   Respiratory: Negative for apnea, cough, choking, chest tightness and shortness of breath.   Gastrointestinal: Positive for constipation.  Endocrine: Negative for cold intolerance, heat  intolerance, polydipsia, polyphagia and polyuria.       Diabetic. Hypothyroid on supplements.  Genitourinary:       Nocturia.  Musculoskeletal: Positive for arthralgias.  Skin:       Issue skin from crawling around underneath a house. Multiple scratches on the skin for repair of duct work under his house. Chronic dry skin.  Allergic/Immunologic: Negative.   Neurological:       Recurrent tension headaches.  Hematological: Negative.   Psychiatric/Behavioral:        Chronic anxiety. Some depressive symptoms. History of insomnia.       Objective:BP 120/80  Pulse 82  Temp(Src) 98.8 F (37.1 C) (Oral)  Resp 18  Ht 5\' 10"  (1.778 m)  Wt 204 lb 6.4 oz (92.715 kg)  BMI 29.33 kg/m2  SpO2 95%    Physical Exam  Constitutional: He is oriented to person, place, and time. He appears well-developed and well-nourished. No distress.  HENT:  Head: Normocephalic and atraumatic.  Right Ear: External ear normal.  Left Ear: External ear normal.  Nose: Nose normal.  Mouth/Throat: Oropharynx is clear and moist.  Mild loss of hearing.  Eyes: Conjunctivae and EOM are normal. Pupils are equal, round, and reactive to light.  Neck:  Upper and lower dentures. Stud in the tongue.  Cardiovascular: Normal rate, regular rhythm, normal heart sounds and intact distal pulses.  Exam reveals  no gallop and no friction rub.   No murmur heard. Pulmonary/Chest: No respiratory distress. He has no wheezes. He has no rales. He exhibits no tenderness.  Abdominal: He exhibits no distension and no mass. There is no tenderness.  Musculoskeletal: Normal range of motion. He exhibits edema. He exhibits no tenderness.  Neurological: He is alert and oriented to person, place, and time. He has normal reflexes. No cranial nerve deficit. Coordination normal.  Skin: No rash noted. No erythema. No pallor.  Multiple tattoos. Multiple piercings. Shave/moles. 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 Polypoid growth of  the left lower eyelid.  Psychiatric: He has a normal mood and affect. His behavior is normal. Thought content normal.      Appointment on 12/23/2012  Component Date Value Range Status  . Cholesterol, Total 12/23/2012 128  100 - 199 mg/dL Final  . Triglycerides 12/23/2012 280* 0 - 149 mg/dL Final  . HDL 95/62/1308 29* >39 mg/dL Final   Comment: According to ATP-III Guidelines, HDL-C >59 mg/dL is considered a                          negative risk factor for CHD.  Marland Kitchen VLDL Cholesterol Cal 12/23/2012 56* 5 - 40 mg/dL Final  . LDL Calculated 12/23/2012 43  0 - 99 mg/dL Final  . Chol/HDL Ratio 12/23/2012 4.4  0.0 - 5.0 ratio units Final  . Glucose 12/23/2012 100* 65 - 99 mg/dL Final  . BUN 65/78/4696 10  8 - 27 mg/dL Final  . Creatinine, Ser 12/23/2012 1.15  0.76 - 1.27 mg/dL Final  . GFR calc non Af Amer 12/23/2012 68  >59 mL/min/1.73 Final  . GFR calc Af Amer 12/23/2012 79  >59 mL/min/1.73 Final  . BUN/Creatinine Ratio 12/23/2012 9* 10 - 22 Final  . Sodium 12/23/2012 142  134 - 144 mmol/L Final  . Potassium 12/23/2012 4.1  3.5 - 5.2 mmol/L Final  . Chloride 12/23/2012 101  97 - 108 mmol/L Final  . CO2 12/23/2012 27  18 - 29 mmol/L Final  . Calcium 12/23/2012 9.3  8.6 - 10.2 mg/dL Final  . Hemoglobin E9B 12/23/2012 6.0* 4.8 - 5.6 % Final   Comment:          Increased risk for diabetes: 5.7 - 6.4                                   Diabetes: >6.4                                   Glycemic control for adults with diabetes: <7.0  . Estimated average glucose 12/23/2012 126   Final       Assessment & Plan:  HTN (hypertension):    - Plan: Comprehensive metabolic panel  Hypothyroidism:    - Plan: TSH  Coronary artery disease: Asymptomatic  Borderline diabetes:   - Plan: Comprehensive metabolic panel, Hemoglobin A1c  Eczema:   - Plan: mometasone (ELOCON) 0.1 % cream  Dyslipidemia:   - Plan: Lipid panel  Anxiety: Chronic, unchanged  Edema: Worse   - Plan: furosemide (LASIX) 40  MG tablet

## 2012-12-25 NOTE — Patient Instructions (Signed)
Continue current medication.

## 2013-02-03 ENCOUNTER — Other Ambulatory Visit: Payer: Self-pay | Admitting: Internal Medicine

## 2013-02-15 IMAGING — CR DG CHEST 1V PORT
1 series · 1 of 1 positions shown · non-contrast
Comparison: 07/08/2011

CLINICAL DATA: Postop from open heart surgery.

PORTABLE CHEST - 1 VIEW

[AP]
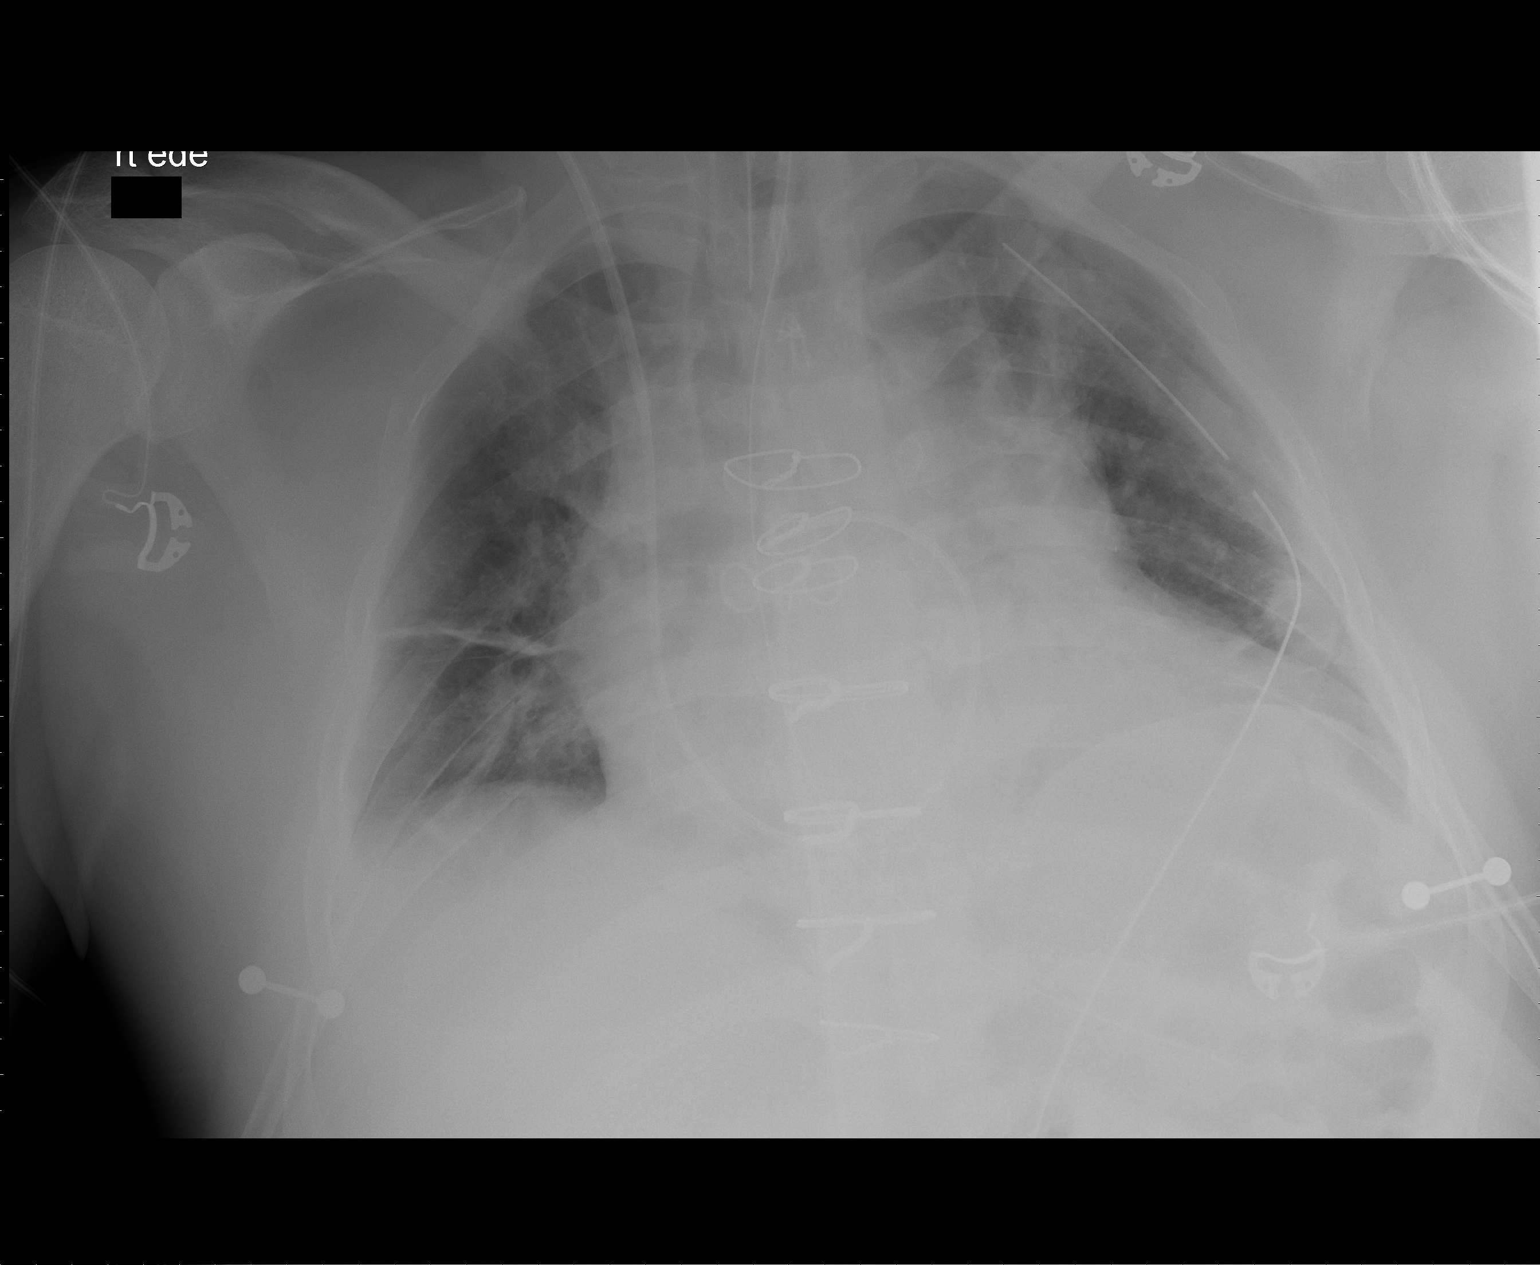

[1 of 1 positions shown; findings below may reference images not displayed]

FINDINGS: 9089 hours.  Endotracheal tube tip is 5.8 cm above the
base of the carina.  NG tube tip overlies the mid stomach.  Left
chest tube noted without evidence for pneumothorax.  Right IJ
pulmonary artery catheter tip is in the right main pulmonary
artery.

Cardiopericardial silhouette is enlarged and mediastinal contours
are increased compared the preoperative film.  Attention on follow-
up imaging recommended.  There is bibasilar atelectasis with
vascular congestion.  Midline pericardial/mediastinal drain is
poorly visualized.
IMPRESSION: Cardiomegaly with prominence of the mediastinum on this immediate
postoperative study.  Attention on follow-up films recommended.

Bilateral atelectasis with vascular congestion.

## 2013-02-17 ENCOUNTER — Encounter: Payer: Self-pay | Admitting: Cardiology

## 2013-02-17 ENCOUNTER — Ambulatory Visit (INDEPENDENT_AMBULATORY_CARE_PROVIDER_SITE_OTHER): Payer: Medicare Other | Admitting: Cardiology

## 2013-02-17 VITALS — BP 142/88 | HR 79 | Ht 70.0 in | Wt 207.0 lb

## 2013-02-17 DIAGNOSIS — I471 Supraventricular tachycardia, unspecified: Secondary | ICD-10-CM

## 2013-02-17 DIAGNOSIS — Z951 Presence of aortocoronary bypass graft: Secondary | ICD-10-CM

## 2013-02-17 DIAGNOSIS — I10 Essential (primary) hypertension: Secondary | ICD-10-CM

## 2013-02-17 DIAGNOSIS — I259 Chronic ischemic heart disease, unspecified: Secondary | ICD-10-CM

## 2013-02-17 DIAGNOSIS — I2510 Atherosclerotic heart disease of native coronary artery without angina pectoris: Secondary | ICD-10-CM

## 2013-02-17 NOTE — Progress Notes (Signed)
Dean Cole Date of Birth:  [birth year] [address] [place], [place]  [address] [phone]         Fax   [phone]  History of Present Illness: This pleasant 62 year old gentleman is seen back for a scheduled followup office visit. He has a history of ischemic heart disease and underwent coronary artery bypass grafting x4 on 07/10/11 by Dr. Donata Clay. He had severe multivessel coronary artery disease. Patient also has a history of high blood pressure, diabetes, hypothyroidism, and mild renal insufficiency. Since his heart surgery in March 2013 the patient was readmitted to the hospital 6 weeks later with acute cholecystitis and underwent cholecystectomy by Dr. Lindie Spruce. Since then the patient has been feeling well except for problems with mild fluid retention. He has noted occasional ankle edema.  He has to take a Lasix one to 3 times per month for mild edema. . Previously he had been chewing tobacco but he has not used tobacco since his bypass surgery. He does not drink any alcohol.   Current Outpatient Prescriptions  Medication Sig Dispense Refill  . aspirin EC 325 MG EC tablet Take 1 tablet (325 mg total) by mouth daily.  30 tablet    . diazepam (VALIUM) 5 MG tablet Take one tablet 4 times a day for nerves  120 tablet  4  . furosemide (LASIX) 40 MG tablet Take 1 tablet (40 mg total) by mouth daily as needed.  30 tablet  5  . imipramine (TOFRANIL) 25 MG tablet TAKE THREE TABLETS BY MOUTH IN THE MORNING AND FOUR TABLETS AT BEDTIME  210 tablet  1  . levothyroxine (SYNTHROID, LEVOTHROID) 150 MCG tablet Take 150 mcg by mouth daily.      . metoprolol tartrate (LOPRESSOR) 25 MG tablet TAKE 1 TABLET TWICE A DAY  60 tablet  8  . mometasone (ELOCON) 0.1 % cream Use daily to control eczema  45 g  0  . NITROSTAT 0.4 MG SL tablet Place 0.4 mg under the tongue every 5 (five) minutes as needed. For chest pain      . oxyCODONE-acetaminophen (PERCOCET/ROXICET) 5-325 MG per tablet  Take 1 tablet by mouth every 4 (four) hours as needed for pain.  15 tablet  0  . polyethylene glycol (MIRALAX / GLYCOLAX) packet Take 17 g by mouth daily. As needed      . simvastatin (ZOCOR) 20 MG tablet TAKE 1 TABLET EVERY DAY TO LOWER CHOLESTROL  30 tablet  4   No current facility-administered medications for this visit.    Allergies  Allergen Reactions  . Adhesive [Tape] Swelling    Patient Active Problem List   Diagnosis Date Noted  . Eczema 12/25/2012  . Anxiety   . Dyslipidemia 02/29/2012  . Postop check 09/22/2011  . Acute cholecystitis 09/11/2011  . PSVT (paroxysmal supraventricular tachycardia) 07/14/2011  . Unstable angina 07/08/2011  . HTN (hypertension)   . Hypothyroidism   . Coronary artery disease   . Borderline diabetes   . Cancer   . Headache   . Restless leg syndrome     History  Smoking status  . Former Smoker  . Quit date: 08/08/1981  Smokeless tobacco  . Former Neurosurgeon    History  Alcohol Use No    Comment: former"    Family History  Problem Relation Age of Onset  . Coronary artery disease Father   . Coronary artery disease Paternal Grandfather     Review of Systems: Constitutional: no fever chills  diaphoresis or fatigue or change in weight.  Head and neck: no hearing loss, no epistaxis, no photophobia or visual disturbance. Respiratory: No cough, shortness of breath or wheezing. Cardiovascular: No chest pain peripheral edema, palpitations. Gastrointestinal: No abdominal distention, no abdominal pain, no change in bowel habits hematochezia or melena. Genitourinary: No dysuria, no frequency, no urgency, no nocturia. Musculoskeletal:No arthralgias, no back pain, no gait disturbance or myalgias. Neurological: No dizziness, no headaches, no numbness, no seizures, no syncope, no weakness, no tremors. Hematologic: No lymphadenopathy, no easy bruising. Psychiatric: No confusion, no hallucinations, no sleep disturbance.    Physical Exam: Filed  Vitals:   02/17/13 0957  BP: 142/88  Pulse: 79   the general appearance reveals a well-developed well-nourished elderly gentleman in no distress.  He has gained 8 more pounds since last visit.  He intends to lose the weight this summer when he is more physically active.The head and neck exam reveals pupils equal and reactive.  Extraocular movements are full.  There is no scleral icterus.  The mouth and pharynx are normal.  The neck is supple.  The carotids reveal no bruits.  The jugular venous pressure is normal.  The  thyroid is not enlarged.  There is no lymphadenopathy.  The chest is clear to percussion and auscultation.  There are no rales or rhonchi.  Expansion of the chest is symmetrical.  The precordium is quiet.  The first heart sound is normal.  The second heart sound is physiologically split.  There is no murmur gallop rub or click.  There is no abnormal lift or heave.  The abdomen is soft and nontender.  The bowel sounds are normal.  The liver and spleen are not enlarged.  There are no abdominal masses.  There are no abdominal bruits.  Extremities reveal good pedal pulses.  There is no phlebitis or edema.  There is no cyanosis or clubbing.  Strength is normal and symmetrical in all extremities.  There is no lateralizing weakness.  There are no sensory deficits.  The skin is warm and dry.  There is no rash.  Assessment / Plan: Continue same medication.  Check in 6 months for followup office visit and EKG.  His weight has been stable since last visit.

## 2013-02-17 NOTE — Patient Instructions (Signed)
Your physician recommends that you continue on your current medications as directed. Please refer to the Current Medication list given to you today.  Your physician wants you to follow-up in: 6 month ov/ekg You will receive a reminder letter in the mail two months in advance. If you don't receive a letter, please call our office to schedule the follow-up appointment.  

## 2013-02-17 NOTE — Assessment & Plan Note (Signed)
The patient has not been experiencing any recurrent chest pain or angina. 

## 2013-02-17 NOTE — Assessment & Plan Note (Signed)
The patient has had no further awareness of tachycardia or palpitations.

## 2013-02-17 NOTE — Assessment & Plan Note (Signed)
Blood pressure was remaining stable at home on current therapy.

## 2013-02-18 IMAGING — CR DG ABDOMEN 1V
2 series · 2 of 2 positions shown · non-contrast
Comparison: PA and lateral chest radiograph - 07/08/2011

CLINICAL DATA: Diffuse abdominal pain, evaluate for ileus

ABDOMEN - 1 VIEW

[t abdomen supine (1 of 2)]
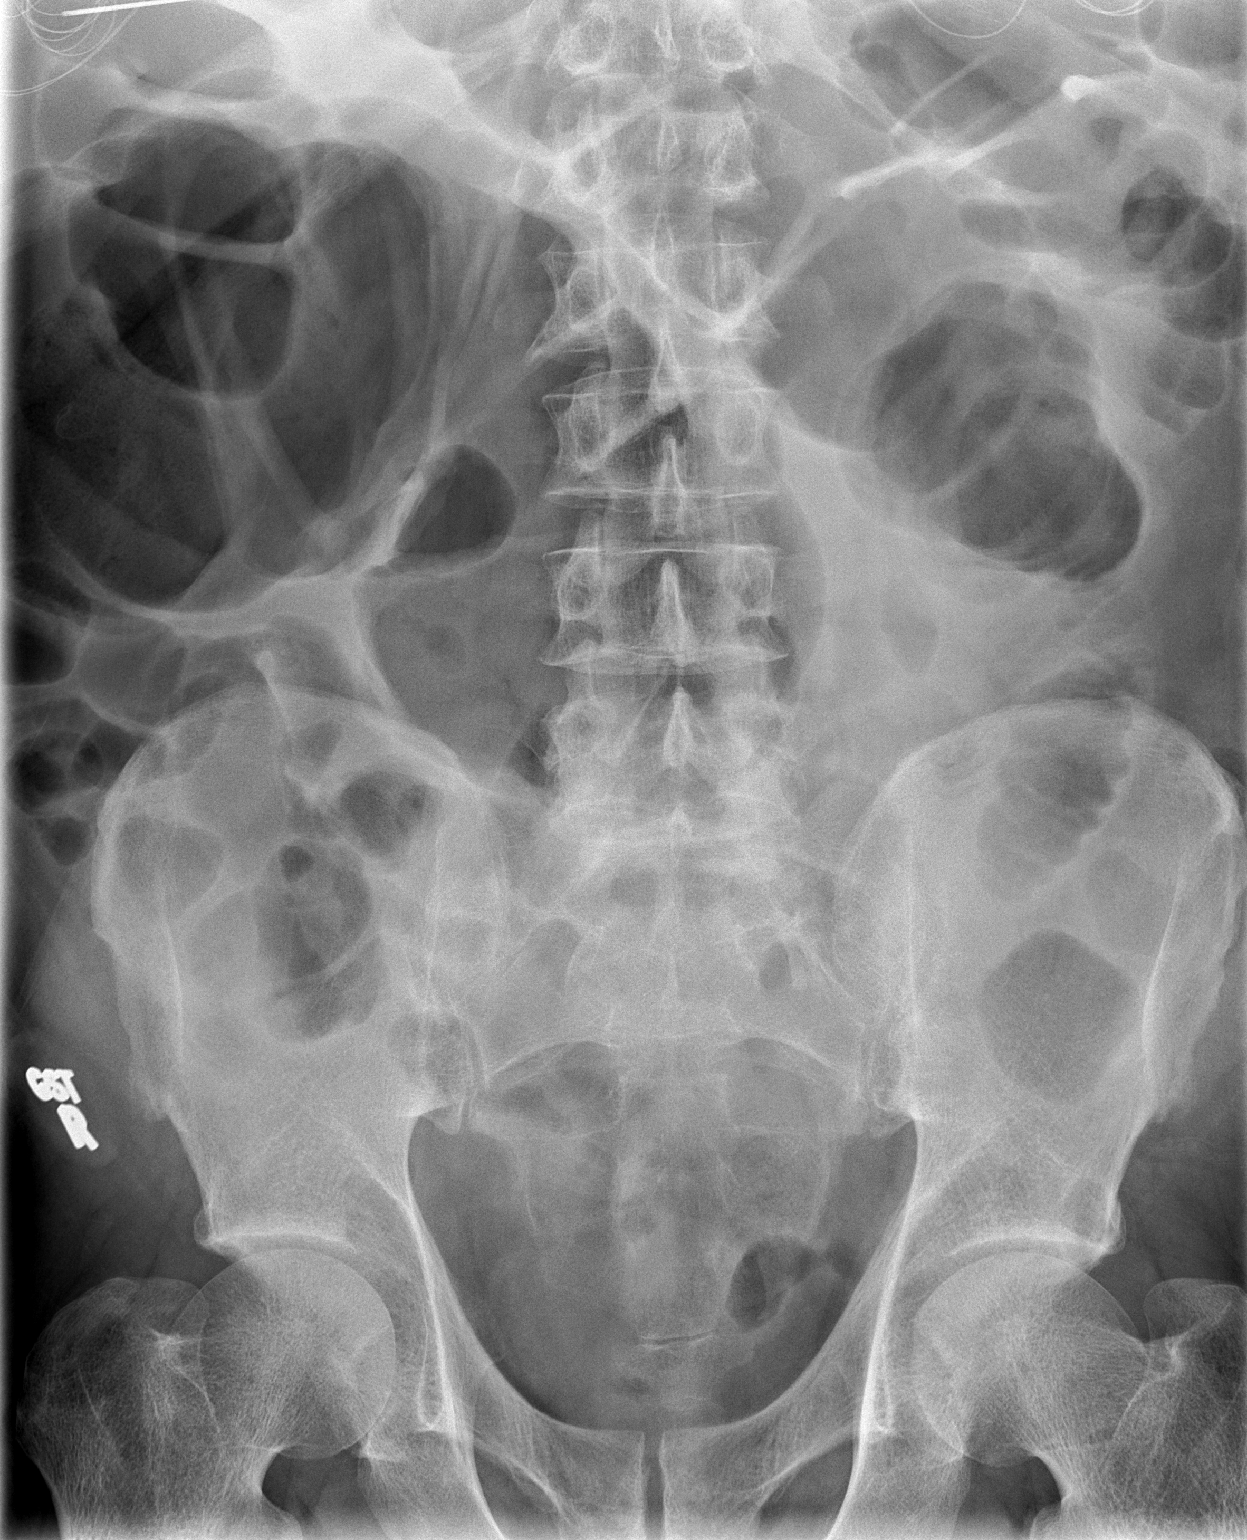

[t abdomen supine (2 of 2)]
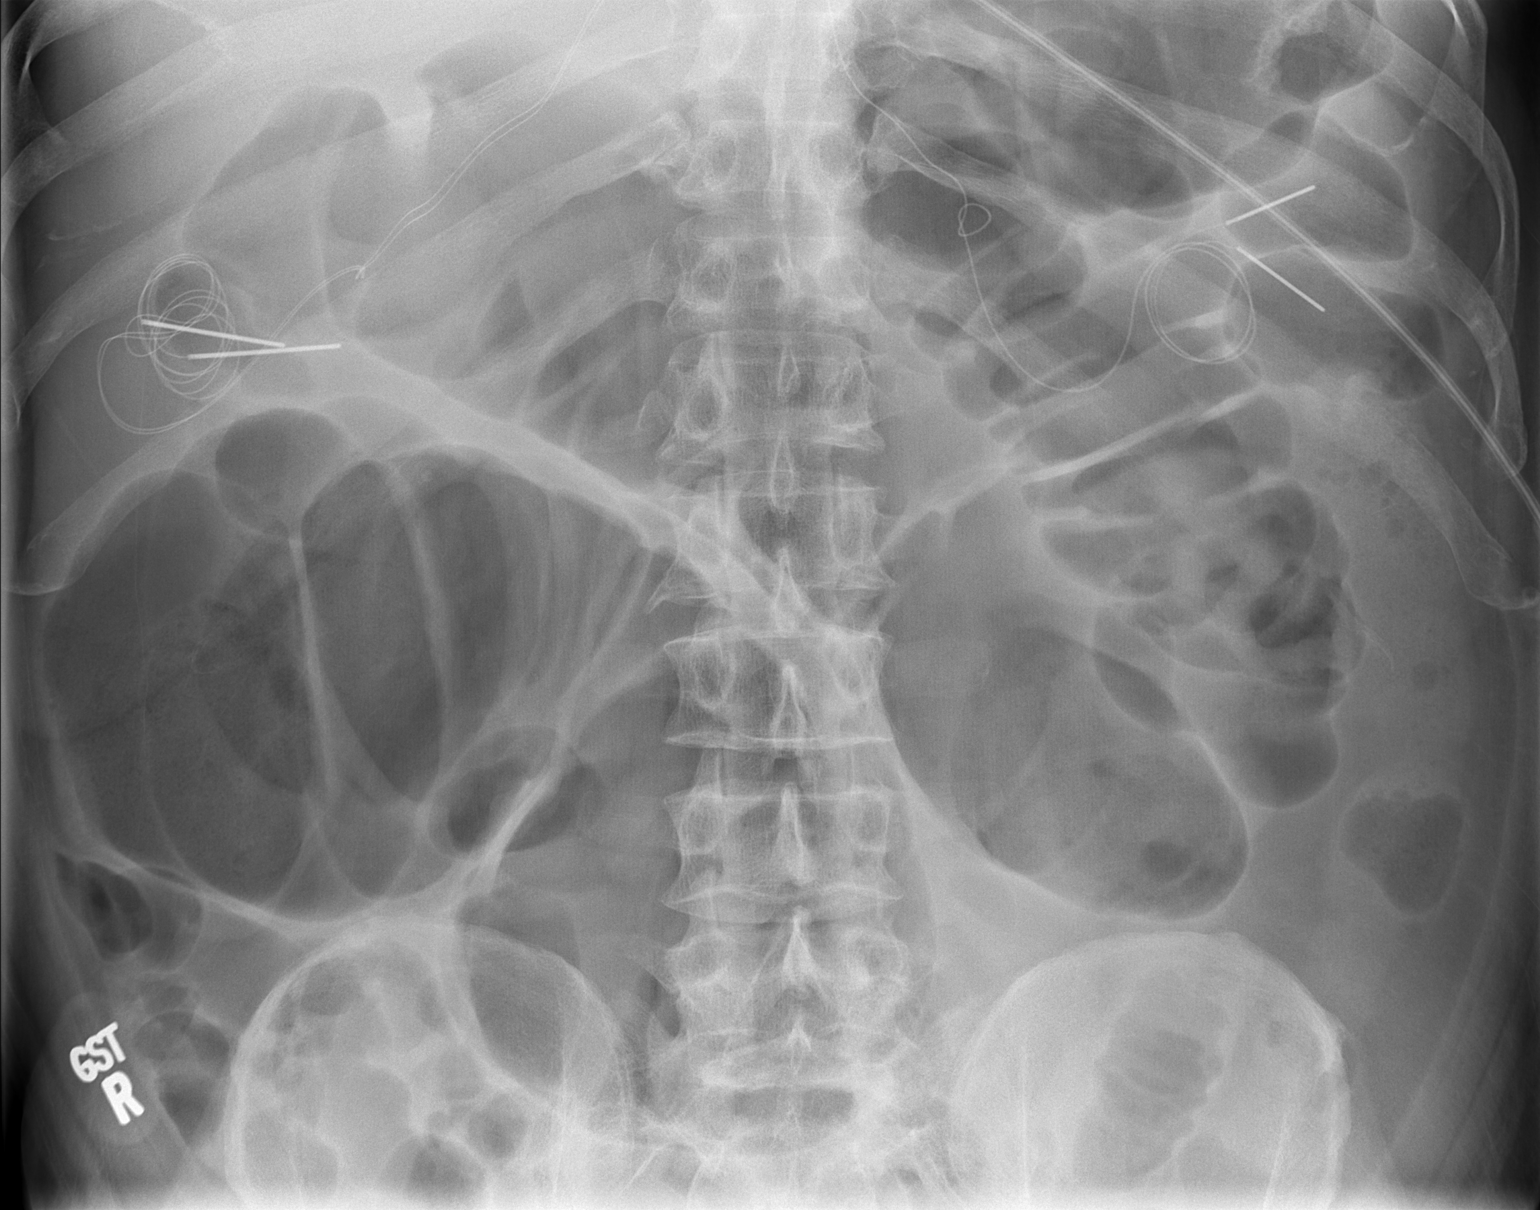

[2 of 2 positions shown; findings below may reference images not displayed]

FINDINGS: There is marked dilatation of the colon, and particular,
the likely high-riding cecum and ascending colon, measuring
approximately 12 cm in greatest diameter.  There is nonspecific
mild dilatation of a solitary loop of small bowel within the left
mid/lower hemiabdomen measuring 3.2 cm in diameter.  Gas is seen
within the descending colon. Evaluation for pneumoperitoneum is
limited secondary to exclusion of the lower thorax.  Epicardial
pacer wires overlie the upper abdomen.  No acute osseous
abnormalities.
IMPRESSION: Gaseous distension of predominantly the colon most suggestive of a
developing ileus.  Continued attention on follow-up is recommended.

## 2013-02-18 IMAGING — CR DG CHEST 2V
2 series · 2 of 2 positions shown · non-contrast
Comparison: 07/12/2011

CLINICAL DATA: History of coronary artery bypass grafting.

CHEST - 2 VIEW

[w chest pa]
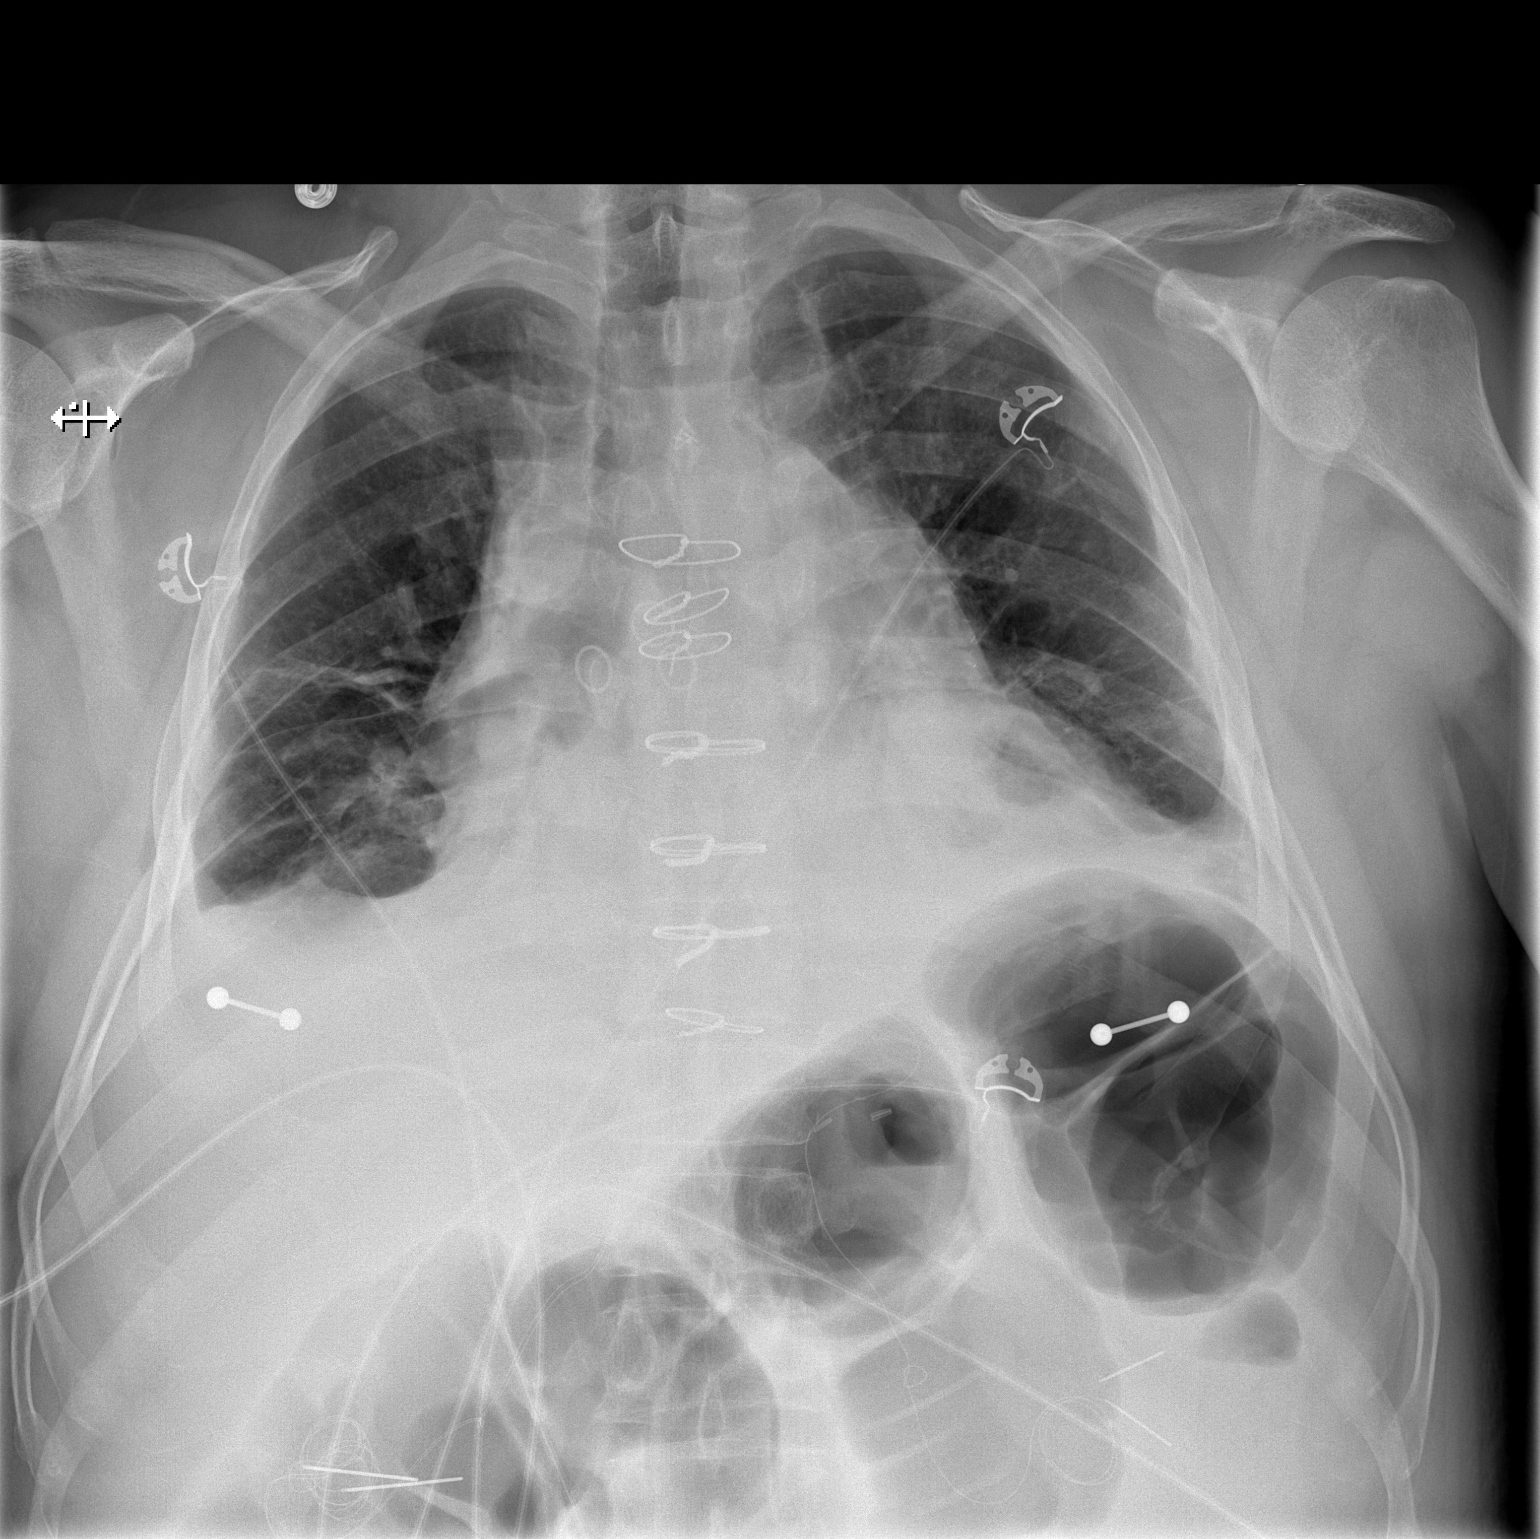

[w chest lat]
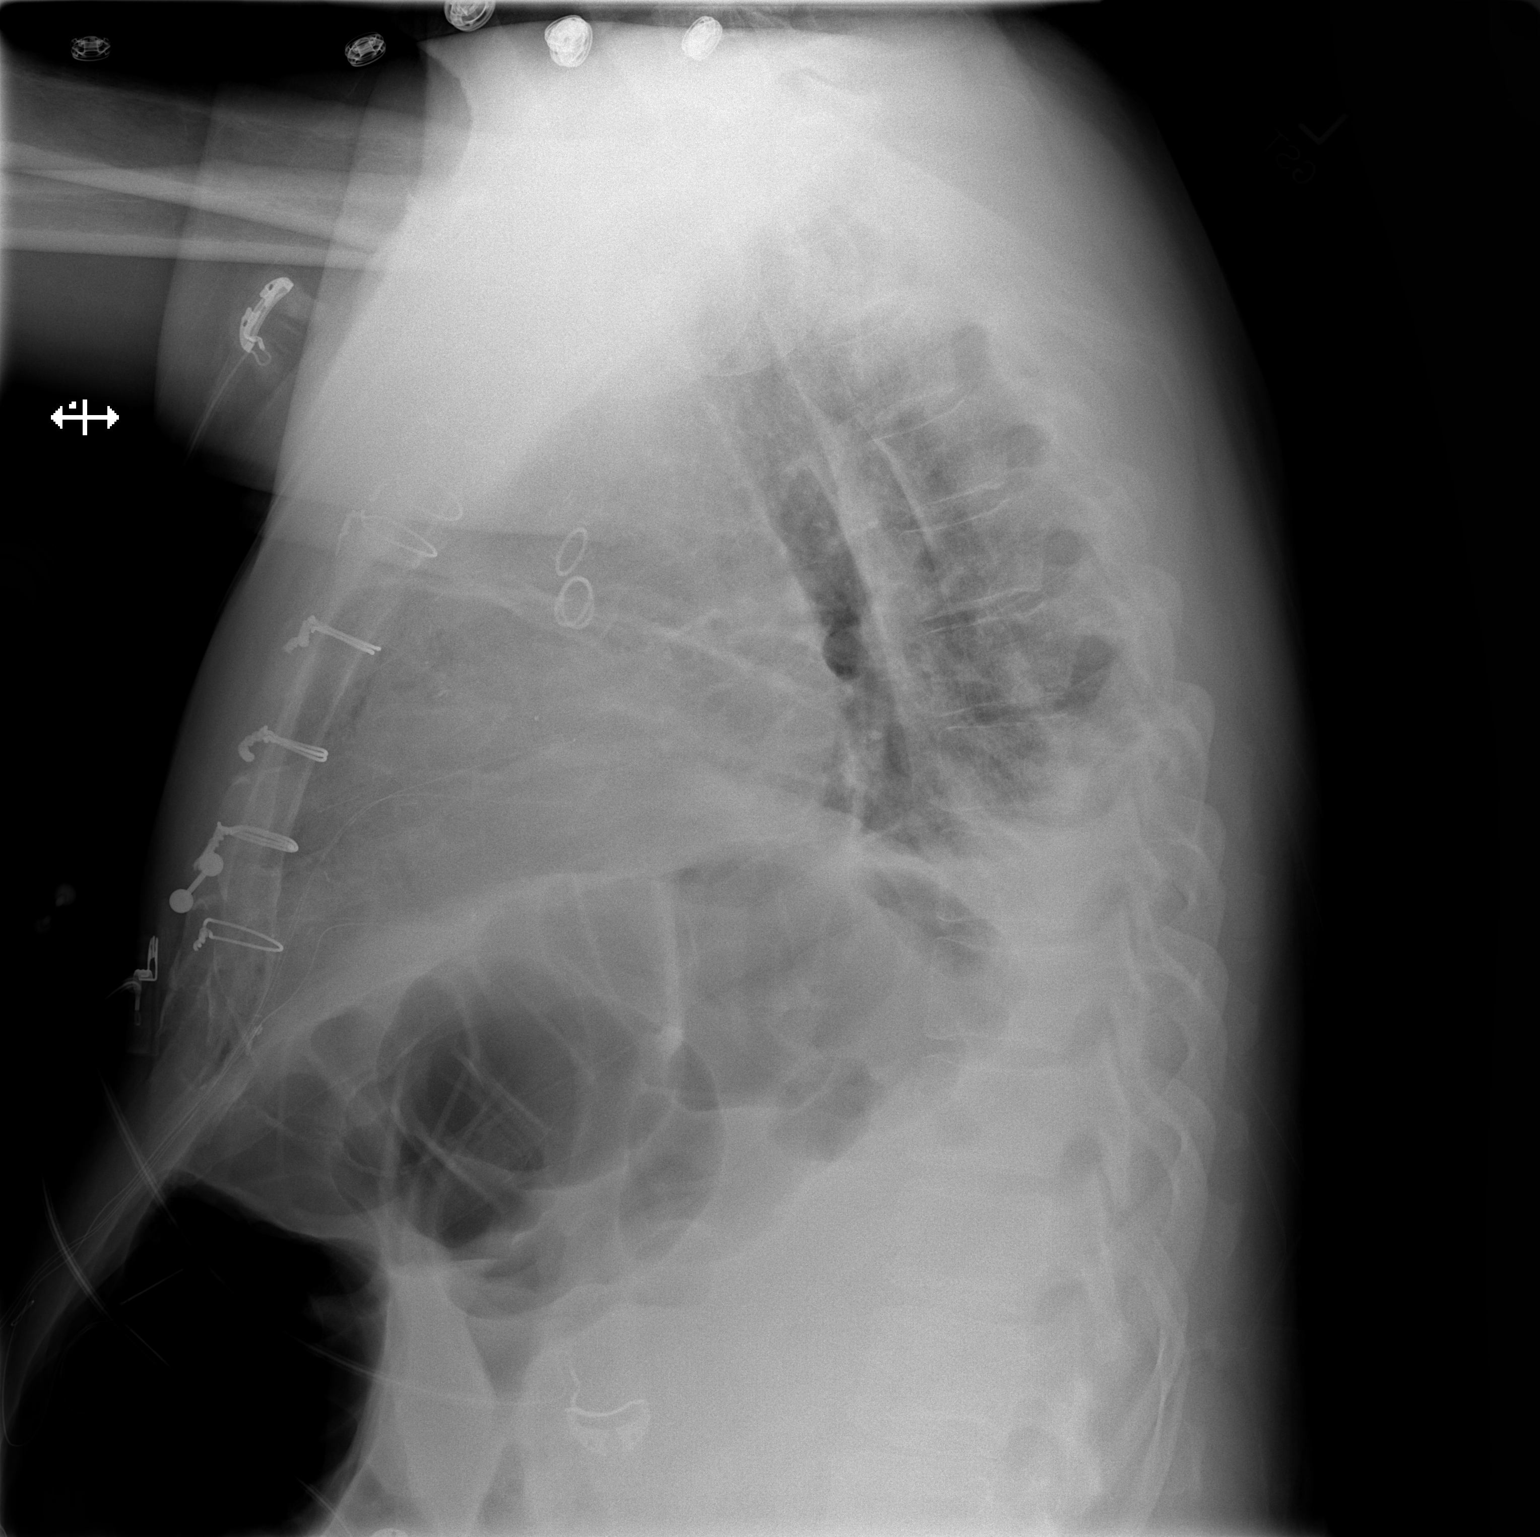

[2 of 2 positions shown; findings below may reference images not displayed]

FINDINGS: The right jugular central venous catheter and left chest
tube have been removed.  No evidence for a pneumothorax.  Stable
appearance of the heart and mediastinum.  Epicardial wires are
still present.  There are persistent basilar densities most
compatible with atelectasis and probably pleural fluid.  No
evidence for pulmonary edema.
IMPRESSION: Removal of support apparatuses as described.  Negative for a
pneumothorax.

Bibasilar densities are suggestive for atelectasis and probably a
small amount of pleural fluid.

## 2013-02-25 ENCOUNTER — Other Ambulatory Visit: Payer: Self-pay | Admitting: *Deleted

## 2013-02-25 MED ORDER — LEVOTHYROXINE SODIUM 150 MCG PO TABS: 150.0000 ug | ORAL_TABLET | Freq: Every day | ORAL | Status: DC

## 2013-03-05 ENCOUNTER — Ambulatory Visit (INDEPENDENT_AMBULATORY_CARE_PROVIDER_SITE_OTHER): Payer: Medicare Other | Admitting: Internal Medicine

## 2013-03-05 VITALS — BP 118/72 | HR 89 | Wt 210.0 lb

## 2013-03-05 DIAGNOSIS — J209 Acute bronchitis, unspecified: Secondary | ICD-10-CM

## 2013-03-05 MED ORDER — AZITHROMYCIN 500 MG PO TABS: ORAL_TABLET | ORAL | Status: DC

## 2013-03-05 NOTE — Progress Notes (Signed)
Subjective:    Patient ID: Dean Cole, male    DOB: 06-25-1950, 62 y.o.   MRN: [id number]  Chief Complaint  Patient presents with  . URI    X 1 week patient with cough and congestion. Patient had sore throat that is now resolved     HPI Head congestion.Rattling cough. Using Creomulsion.   Current Outpatient Prescriptions on File Prior to Visit  Medication Sig Dispense Refill  . aspirin EC 325 MG EC tablet Take 1 tablet (325 mg total) by mouth daily.  30 tablet    . diazepam (VALIUM) 5 MG tablet Take one tablet 4 times a day for nerves  120 tablet  4  . furosemide (LASIX) 40 MG tablet Take 1 tablet (40 mg total) by mouth daily as needed.  30 tablet  5  . imipramine (TOFRANIL) 25 MG tablet TAKE THREE TABLETS BY MOUTH IN THE MORNING AND FOUR TABLETS AT BEDTIME  210 tablet  1  . levothyroxine (SYNTHROID, LEVOTHROID) 150 MCG tablet Take 1 tablet (150 mcg total) by mouth daily.  90 tablet  1  . metoprolol tartrate (LOPRESSOR) 25 MG tablet TAKE 1 TABLET TWICE A DAY  60 tablet  8  . mometasone (ELOCON) 0.1 % cream Use daily to control eczema  45 g  0  . NITROSTAT 0.4 MG SL tablet Place 0.4 mg under the tongue every 5 (five) minutes as needed. For chest pain      . oxyCODONE-acetaminophen (PERCOCET/ROXICET) 5-325 MG per tablet Take 1 tablet by mouth every 4 (four) hours as needed for pain.  15 tablet  0  . polyethylene glycol (MIRALAX / GLYCOLAX) packet Take 17 g by mouth daily. As needed      . simvastatin (ZOCOR) 20 MG tablet TAKE 1 TABLET EVERY DAY TO LOWER CHOLESTROL  30 tablet  4   No current facility-administered medications on file prior to visit.    Review of Systems  Constitutional: Positive for fatigue. Negative for fever, chills, activity change, appetite change and unexpected weight change.  HENT: Positive for congestion, hearing loss and rhinorrhea. Negative for ear pain and tinnitus.   Eyes: Negative.   Respiratory: Positive for cough and wheezing. Negative for apnea,  choking, chest tightness and shortness of breath.   Cardiovascular: Negative for chest pain, palpitations and leg swelling.  Gastrointestinal: Positive for constipation.  Endocrine: Negative for cold intolerance, heat intolerance, polydipsia, polyphagia and polyuria.       Diabetic. Hypothyroid on supplements.  Genitourinary:       Nocturia.  Musculoskeletal: Positive for arthralgias.  Skin:       Chronic dry skin.  Allergic/Immunologic: Negative.   Neurological:       Recurrent tension headaches.  Hematological: Negative.   Psychiatric/Behavioral:        Chronic anxiety. Some depressive symptoms. History of insomnia.       Objective:BP 118/72  Pulse 89  Wt 210 lb (95.255 kg)  SpO2 93%    Physical Exam  Constitutional: He is oriented to person, place, and time. He appears well-developed and well-nourished. No distress.  HENT:  Head: Normocephalic and atraumatic.  Right Ear: External ear normal.  Left Ear: External ear normal.  Nose: Nose normal.  Mouth/Throat: Oropharynx is clear and moist.  Mild loss of hearing.  Eyes: Conjunctivae and EOM are normal. Pupils are equal, round, and reactive to light.  Neck:  Upper and lower dentures. Stud in the tongue.  Cardiovascular: Normal rate, regular rhythm, normal heart sounds and  intact distal pulses.  Exam reveals no gallop and no friction rub.   No murmur heard. Pulmonary/Chest: No respiratory distress. He has no wheezes. He has no rales. He exhibits no tenderness.  Abdominal: He exhibits no distension and no mass. There is no tenderness.  Musculoskeletal: Normal range of motion. He exhibits edema. He exhibits no tenderness.  Neurological: He is alert and oriented to person, place, and time. He has normal reflexes. No cranial nerve deficit. Coordination normal.  Skin: No rash noted. No erythema. No pallor.  Multiple tattoos. Multiple piercings. Shave/moles. 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 Polypoid growth  of the left lower eyelid.  Psychiatric: He has a normal mood and affect. His behavior is normal. Thought content normal.          Assessment & Plan:  Acute bronchitis - Plan: azithromycin (ZITHROMAX) 500 MG tablet

## 2013-03-05 NOTE — Patient Instructions (Signed)
Drink plenty of fluids. Take 3 days of antibiotics.

## 2013-03-23 ENCOUNTER — Other Ambulatory Visit: Payer: Self-pay | Admitting: Internal Medicine

## 2013-03-25 ENCOUNTER — Encounter: Payer: Self-pay | Admitting: Internal Medicine

## 2013-03-25 ENCOUNTER — Other Ambulatory Visit: Payer: Self-pay | Admitting: *Deleted

## 2013-03-25 ENCOUNTER — Other Ambulatory Visit: Payer: Self-pay

## 2013-03-25 MED ORDER — METOPROLOL TARTRATE 25 MG PO TABS: ORAL_TABLET | ORAL | Status: DC

## 2013-03-25 MED ORDER — SIMVASTATIN 20 MG PO TABS: 20.0000 mg | ORAL_TABLET | Freq: Every day | ORAL | Status: DC

## 2013-04-09 ENCOUNTER — Other Ambulatory Visit: Payer: Self-pay | Admitting: Internal Medicine

## 2013-05-07 ENCOUNTER — Other Ambulatory Visit: Payer: Self-pay | Admitting: Nurse Practitioner

## 2013-05-09 ENCOUNTER — Other Ambulatory Visit: Payer: Self-pay | Admitting: *Deleted

## 2013-05-22 ENCOUNTER — Other Ambulatory Visit: Payer: Self-pay | Admitting: Internal Medicine

## 2013-06-12 ENCOUNTER — Other Ambulatory Visit: Payer: Medicare Other

## 2013-06-12 DIAGNOSIS — I10 Essential (primary) hypertension: Secondary | ICD-10-CM

## 2013-06-12 DIAGNOSIS — E785 Hyperlipidemia, unspecified: Secondary | ICD-10-CM

## 2013-06-12 DIAGNOSIS — E039 Hypothyroidism, unspecified: Secondary | ICD-10-CM

## 2013-06-12 DIAGNOSIS — R7303 Prediabetes: Secondary | ICD-10-CM

## 2013-06-13 ENCOUNTER — Other Ambulatory Visit: Payer: Medicare Other

## 2013-06-13 LAB — COMPREHENSIVE METABOLIC PANEL
A/G RATIO: 1.5 (ref 1.1–2.5)
ALT: 47 IU/L — ABNORMAL HIGH (ref 0–44)
AST: 46 IU/L — ABNORMAL HIGH (ref 0–40)
Albumin: 4.8 g/dL (ref 3.6–4.8)
Alkaline Phosphatase: 151 IU/L — ABNORMAL HIGH (ref 39–117)
BUN/Creatinine Ratio: 11 (ref 10–22)
BUN: 15 mg/dL (ref 8–27)
CALCIUM: 9.6 mg/dL (ref 8.6–10.2)
CO2: 27 mmol/L (ref 18–29)
CREATININE: 1.42 mg/dL — AB (ref 0.76–1.27)
Chloride: 97 mmol/L (ref 97–108)
GFR calc Af Amer: 61 mL/min/{1.73_m2} (ref >59)
GFR, EST NON AFRICAN AMERICAN: 53 mL/min/{1.73_m2} — AB (ref >59)
GLOBULIN, TOTAL: 3.1 g/dL (ref 1.5–4.5)
GLUCOSE: 112 mg/dL — AB (ref 65–99)
Potassium: 4.7 mmol/L (ref 3.5–5.2)
Sodium: 140 mmol/L (ref 134–144)
TOTAL PROTEIN: 7.9 g/dL (ref 6.0–8.5)
Total Bilirubin: 0.6 mg/dL (ref 0.0–1.2)

## 2013-06-13 LAB — HEMOGLOBIN A1C
ESTIMATED AVERAGE GLUCOSE: 137 mg/dL
Hgb A1c MFr Bld: 6.4 % — ABNORMAL HIGH (ref 4.8–5.6)

## 2013-06-13 LAB — LIPID PANEL
CHOLESTEROL TOTAL: 171 mg/dL (ref 100–199)
Chol/HDL Ratio: 5.7 ratio units — ABNORMAL HIGH (ref 0.0–5.0)
HDL: 30 mg/dL — ABNORMAL LOW (ref >39)
TRIGLYCERIDES: 433 mg/dL — AB (ref 0–149)

## 2013-06-13 LAB — TSH: TSH: 3.1 u[IU]/mL (ref 0.450–4.500)

## 2013-06-18 ENCOUNTER — Ambulatory Visit: Payer: Medicare Other | Admitting: Internal Medicine

## 2013-07-01 ENCOUNTER — Other Ambulatory Visit: Payer: Self-pay | Admitting: Internal Medicine

## 2013-07-08 ENCOUNTER — Ambulatory Visit (INDEPENDENT_AMBULATORY_CARE_PROVIDER_SITE_OTHER): Payer: Medicare Other | Admitting: Internal Medicine

## 2013-07-08 ENCOUNTER — Encounter: Payer: Self-pay | Admitting: Internal Medicine

## 2013-07-08 VITALS — BP 118/74 | HR 74 | Temp 98.0°F | Wt 210.6 lb

## 2013-07-08 DIAGNOSIS — R748 Abnormal levels of other serum enzymes: Secondary | ICD-10-CM

## 2013-07-08 DIAGNOSIS — E039 Hypothyroidism, unspecified: Secondary | ICD-10-CM

## 2013-07-08 DIAGNOSIS — E785 Hyperlipidemia, unspecified: Secondary | ICD-10-CM

## 2013-07-08 DIAGNOSIS — R7303 Prediabetes: Secondary | ICD-10-CM

## 2013-07-08 DIAGNOSIS — I10 Essential (primary) hypertension: Secondary | ICD-10-CM

## 2013-07-08 DIAGNOSIS — F419 Anxiety disorder, unspecified: Secondary | ICD-10-CM

## 2013-07-08 DIAGNOSIS — R7309 Other abnormal glucose: Secondary | ICD-10-CM

## 2013-07-08 DIAGNOSIS — I2510 Atherosclerotic heart disease of native coronary artery without angina pectoris: Secondary | ICD-10-CM

## 2013-07-08 DIAGNOSIS — F411 Generalized anxiety disorder: Secondary | ICD-10-CM

## 2013-07-08 MED ORDER — DIAZEPAM 5 MG PO TABS: ORAL_TABLET | ORAL | Status: DC

## 2013-07-08 NOTE — Progress Notes (Signed)
Patient ID: Dean Cole, male   DOB: [birth year], 63 y.o.   MRN: [id number]    Location:    [facility]  Place of Service:   OFFICE  Contact Information   Name Relation Home Work [place] Spouse [phone]  [phone]        Allergies  Allergen Reactions  . Adhesive [Tape] Swelling    Chief Complaint  Patient presents with  . Medical Managment of Chronic Issues    6 month f/u & discuss labs done on 06/12/13. (printed)  . Immunizations    ? flu shot to discuss with you.    HPI:   Borderline diabetes: control  Dyslipidemia: high trig. Cholesterol is OK  Coronary artery disease: no chest pain  HTN (hypertension): controlled  Hypothyroidism: controlled    Medications: Patient's Medications  New Prescriptions   No medications on file  Previous Medications   ASPIRIN EC 325 MG EC TABLET    Take 1 tablet (325 mg total) by mouth daily.   DIAZEPAM (VALIUM) 5 MG TABLET    TAKE 1 TABLET FOUR TIMES A DAY AS NEEDED FOR NERVES   FUROSEMIDE (LASIX) 40 MG TABLET    Take 1 tablet (40 mg total) by mouth daily as needed.   IMIPRAMINE (TOFRANIL) 25 MG TABLET    TAKE 3 TABLETS BY MOUTH EVERY MORNING AND 4 TABLET AT BEDTIME   LEVOTHYROXINE (SYNTHROID, LEVOTHROID) 150 MCG TABLET    Take 1 tablet (150 mcg total) by mouth daily.   METOPROLOL TARTRATE (LOPRESSOR) 25 MG TABLET    TAKE 1 TABLET TWICE A DAY   MOMETASONE (ELOCON) 0.1 % CREAM    USE DAILY TO CONTROL ECZEMA   NITROSTAT 0.4 MG SL TABLET    Place 0.4 mg under the tongue every 5 (five) minutes as needed. For chest pain   PENTAZOCINE-NALOXONE (TALWIN NX) 50-0.5 MG PER TABLET    Take 1 tablet by mouth every 4 (four) hours as needed for pain.   POLYETHYLENE GLYCOL (MIRALAX / GLYCOLAX) PACKET    Take 17 g by mouth daily. As needed   SIMVASTATIN (ZOCOR) 20 MG TABLET    Take 1 tablet (20 mg total) by mouth daily.  Modified Medications   No medications on file  Discontinued Medications   AZITHROMYCIN (ZITHROMAX)  500 MG TABLET    One daily for infection   OXYCODONE-ACETAMINOPHEN (PERCOCET/ROXICET) 5-325 MG PER TABLET    Take 1 tablet by mouth every 4 (four) hours as needed for pain.     Review of Systems  Constitutional: Positive for fatigue. Negative for fever, chills, activity change, appetite change and unexpected weight change.  HENT: Positive for congestion, hearing loss and rhinorrhea. Negative for ear pain and tinnitus.   Eyes: Negative.   Respiratory: Positive for cough and wheezing. Negative for apnea, choking, chest tightness and shortness of breath.   Cardiovascular: Negative for chest pain, palpitations and leg swelling.  Gastrointestinal: Positive for constipation.  Endocrine: Negative for cold intolerance, heat intolerance, polydipsia, polyphagia and polyuria.       Diabetic. Hypothyroid on supplements.  Genitourinary:       Nocturia.  Musculoskeletal: Positive for arthralgias.  Skin:       Chronic dry skin.  Allergic/Immunologic: Negative.   Neurological:       Recurrent tension headaches.  Hematological: Negative.   Psychiatric/Behavioral:        Chronic anxiety. Some depressive symptoms. History of insomnia.    Filed Vitals:   07/08/13 1545  BP: 118/74  Pulse: 74  Temp: 98 F (36.7 C)  TempSrc: Oral  Weight: 210 lb 9.6 oz (95.528 kg)  SpO2: 96%   Physical Exam  Constitutional: He is oriented to person, place, and time. He appears well-developed and well-nourished. No distress.  HENT:  Head: Normocephalic and atraumatic.  Right Ear: External ear normal.  Left Ear: External ear normal.  Nose: Nose normal.  Mouth/Throat: Oropharynx is clear and moist.  Mild loss of hearing.  Eyes: Conjunctivae and EOM are normal. Pupils are equal, round, and reactive to light.  Neck:  Upper and lower dentures. Stud in the tongue.  Cardiovascular: Normal rate, regular rhythm, normal heart sounds and intact distal pulses.  Exam reveals no gallop and no friction rub.   No murmur  heard. Pulmonary/Chest: No respiratory distress. He has no wheezes. He has no rales. He exhibits no tenderness.  Abdominal: He exhibits no distension and no mass. There is no tenderness.  Musculoskeletal: Normal range of motion. He exhibits edema. He exhibits no tenderness.  Neurological: He is alert and oriented to person, place, and time. He has normal reflexes. No cranial nerve deficit. Coordination normal.  Skin: No rash noted. No erythema. No pallor.  Multiple tattoos. Multiple piercings. Shave/moles. 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 Polypoid growth of the left lower eyelid.  Psychiatric: He has a normal mood and affect. His behavior is normal. Thought content normal.     Labs reviewed: Office Visit on 07/08/2013  Component Date Value Ref Range Status  . [facility] Colonoscopy 05/05/2003 [facility], removed hemorroids.   Final  Appointment on 06/12/2013  Component Date Value Ref Range Status  . Glucose 06/12/2013 112* 65 - 99 mg/dL Final  . BUN 06/12/2013 15  8 - 27 mg/dL Final  . Creatinine, Ser 06/12/2013 1.42* 0.76 - 1.27 mg/dL Final  . GFR calc non Af Amer 06/12/2013 53* >59 mL/min/1.73 Final  . GFR calc Af Amer 06/12/2013 61  >59 mL/min/1.73 Final  . BUN/Creatinine Ratio 06/12/2013 11  10 - 22 Final  . Sodium 06/12/2013 140  134 - 144 mmol/L Final  . Potassium 06/12/2013 4.7  3.5 - 5.2 mmol/L Final  . Chloride 06/12/2013 97  97 - 108 mmol/L Final  . CO2 06/12/2013 27  18 - 29 mmol/L Final  . Calcium 06/12/2013 9.6  8.6 - 10.2 mg/dL Final  . Total Protein 06/12/2013 7.9  6.0 - 8.5 g/dL Final  . Albumin 06/12/2013 4.8  3.6 - 4.8 g/dL Final  . Globulin, Total 06/12/2013 3.1  1.5 - 4.5 g/dL Final  . Albumin/Globulin Ratio 06/12/2013 1.5  1.1 - 2.5 Final  . Total Bilirubin 06/12/2013 0.6  0.0 - 1.2 mg/dL Final  . Alkaline Phosphatase 06/12/2013 151* 39 - 117 IU/L Final  . AST 06/12/2013 46* 0 - 40 IU/L Final  . ALT 06/12/2013 47* 0 - 44 IU/L Final  .  Hemoglobin A1C 06/12/2013 6.4* 4.8 - 5.6 % Final   Comment:          Increased risk for diabetes: 5.7 - 6.4                                   Diabetes: >6.4                                   Glycemic  control for adults with diabetes: <7.0  . Estimated average glucose 06/12/2013 137   Final  . Cholesterol, Total 06/12/2013 171  100 - 199 mg/dL Final  . Triglycerides 06/12/2013 433* 0 - 149 mg/dL Final  . HDL 06/12/2013 30* >39 mg/dL Final   Comment: According to ATP-III Guidelines, HDL-C >59 mg/dL is considered a                          negative risk factor for CHD.  Marland Kitchen VLDL Cholesterol Cal 06/12/2013 Comment  5 - 40 mg/dL Final   Comment: The calculation for the VLDL cholesterol is not valid when                          triglyceride level is >400 mg/dL.  Marland Kitchen LDL Calculated 06/12/2013 Comment  0 - 99 mg/dL Final   Comment: Triglyceride result indicated is too high for an accurate LDL                          cholesterol estimation.  . Chol/HDL Ratio 06/12/2013 5.7* 0.0 - 5.0 ratio units Final   Comment:                                   T. Chol/HDL Ratio                                                                      Men  Women                                                        1/2 Avg.Risk  3.4    3.3                                                            Avg.Risk  5.0    4.4                                                         2X Avg.Risk  9.6    7.1                                                         3X Avg.Risk 23.4   11.0  . TSH 06/12/2013 3.100  0.450 - 4.500 uIU/mL Final      Assessment/Plan  1. Borderline diabetes - Hemoglobin A1c;  Future - CMP; Future  2. Dyslipidemia - Lipid panel; Future  3. Coronary artery disease asymptomatic  4. HTN (hypertension) controlled - CMP; Future  5. Hypothyroidism compensated  6. Anxiety - diazepam (VALIUM) 5 MG tablet; Take 1 tablet 4 times a day as needed for nerves  Dispense: 120 tablet; Refill: 0  7.  Elevated liver enzymes - CMP; Future

## 2013-07-08 NOTE — Patient Instructions (Signed)
Use medications as iindicated

## 2013-07-21 ENCOUNTER — Other Ambulatory Visit: Payer: Self-pay

## 2013-07-21 MED ORDER — PENTAZOCINE-NALOXONE 50-0.5 MG PO TABS: 1.0000 | ORAL_TABLET | ORAL | Status: DC | PRN

## 2013-07-21 NOTE — Telephone Encounter (Signed)
Per patient Dr.Green gives him this rx for migraines. Patient states he is taking about 2 pills daily. Per Dr.Reed dispense #90

## 2013-08-19 ENCOUNTER — Encounter: Payer: Self-pay | Admitting: Cardiology

## 2013-08-19 ENCOUNTER — Ambulatory Visit (INDEPENDENT_AMBULATORY_CARE_PROVIDER_SITE_OTHER): Payer: Medicare Other | Admitting: Cardiology

## 2013-08-19 VITALS — BP 132/77 | HR 78 | Ht 70.0 in | Wt 209.0 lb

## 2013-08-19 DIAGNOSIS — E785 Hyperlipidemia, unspecified: Secondary | ICD-10-CM

## 2013-08-19 DIAGNOSIS — I10 Essential (primary) hypertension: Secondary | ICD-10-CM

## 2013-08-19 DIAGNOSIS — I2510 Atherosclerotic heart disease of native coronary artery without angina pectoris: Secondary | ICD-10-CM

## 2013-08-19 DIAGNOSIS — Z8679 Personal history of other diseases of the circulatory system: Secondary | ICD-10-CM

## 2013-08-19 NOTE — Assessment & Plan Note (Signed)
Blood pressure has been remaining stable on current therapy.  No headaches dizziness or syncope.

## 2013-08-19 NOTE — Progress Notes (Signed)
Dean Cole Date of Birth:  12-Oct-1950 [address] [place], [place]  [address] [phone]         Fax   [phone]  History of Present Illness: This pleasant 63 year old gentleman is seen back for a scheduled followup office visit. He has a history of ischemic heart disease and underwent coronary artery bypass grafting x4 on 07/10/11 by Dr. Prescott Gum. He had severe multivessel coronary artery disease. Patient also has a history of high blood pressure, diabetes, hypothyroidism, and mild renal insufficiency. Since his heart surgery in March 2013 the patient was readmitted to the hospital 6 weeks later with acute cholecystitis and underwent cholecystectomy by Dr. Hulen Skains. Since then the patient has been feeling well except for problems with mild fluid retention. He has noted occasional ankle edema.  He has to take a Lasix one to 3 times per month for mild edema. . Previously he had been chewing tobacco but he has not used tobacco since his bypass surgery. He does not drink any alcohol.  The patient has hypercholesterolemia and is on and is on simvastatin 20 mg daily.  Dr. Carlota Raspberry checks his labs.   Current Outpatient Prescriptions  Medication Sig Dispense Refill  . aspirin EC 325 MG EC tablet Take 1 tablet (325 mg total) by mouth daily.  30 tablet    . diazepam (VALIUM) 5 MG tablet Take 1 tablet 4 times a day as needed for nerves  120 tablet  0  . furosemide (LASIX) 40 MG tablet Take 1 tablet (40 mg total) by mouth daily as needed.  30 tablet  5  . imipramine (TOFRANIL) 25 MG tablet TAKE 3 TABLETS BY MOUTH EVERY MORNING AND 4 TABLET AT BEDTIME  210 tablet  0  . levothyroxine (SYNTHROID, LEVOTHROID) 150 MCG tablet Take 1 tablet (150 mcg total) by mouth daily.  90 tablet  1  . metoprolol tartrate (LOPRESSOR) 25 MG tablet TAKE 1 TABLET TWICE A DAY  180 tablet  1  . mometasone (ELOCON) 0.1 % cream USE DAILY TO CONTROL ECZEMA  45 g  0  . NITROSTAT 0.4 MG SL tablet Place 0.4 mg  under the tongue every 5 (five) minutes as needed. For chest pain      . pentazocine-naloxone (TALWIN NX) 50-0.5 MG per tablet Take 1 tablet by mouth every 4 (four) hours as needed for pain.  90 tablet  0  . polyethylene glycol (MIRALAX / GLYCOLAX) packet Take 17 g by mouth daily. As needed      . simvastatin (ZOCOR) 20 MG tablet Take 1 tablet (20 mg total) by mouth daily.  90 tablet  3   No current facility-administered medications for this visit.    Allergies  Allergen Reactions  . Adhesive [Tape] Swelling    Patient Active Problem List   Diagnosis Date Noted  . Elevated liver enzymes 07/08/2013  . Eczema 12/25/2012  . Anxiety   . Dyslipidemia 02/29/2012  . Postop check 09/22/2011  . PSVT (paroxysmal supraventricular tachycardia) 07/14/2011  . Unstable angina 07/08/2011  . HTN (hypertension)   . Hypothyroidism   . Coronary artery disease   . Borderline diabetes   . Cancer   . Headache   . Restless leg syndrome     History  Smoking status  . Former Smoker  . Quit date: 08/08/1981  Smokeless tobacco  . Former Systems developer    History  Alcohol Use No    Comment: former"    Family History  Problem Relation Age of Onset  . Coronary artery disease Father   . Coronary artery disease Paternal Grandfather   . Suicidality Brother     Review of Systems: Constitutional: no fever chills diaphoresis or fatigue or change in weight.  Head and neck: no hearing loss, no epistaxis, no photophobia or visual disturbance. Respiratory: No cough, shortness of breath or wheezing. Cardiovascular: No chest pain peripheral edema, palpitations. Gastrointestinal: No abdominal distention, no abdominal pain, no change in bowel habits hematochezia or melena. Genitourinary: No dysuria, no frequency, no urgency, no nocturia. Musculoskeletal:No arthralgias, no back pain, no gait disturbance or myalgias. Neurological: No dizziness, no headaches, no numbness, no seizures, no syncope, no weakness, no  tremors. Hematologic: No lymphadenopathy, no easy bruising. Psychiatric: No confusion, no hallucinations, no sleep disturbance.    Physical Exam: Filed Vitals:   08/19/13 0917  BP: 132/77  Pulse: 78   the general appearance reveals a well-developed well-nourished elderly gentleman in no distress.  He has gained 8 more pounds since last visit.  He intends to lose the weight this summer when he is more physically active.The head and neck exam reveals pupils equal and reactive.  Extraocular movements are full.  There is no scleral icterus.  The mouth and pharynx are normal.  The neck is supple.  The carotids reveal no bruits.  The jugular venous pressure is normal.  The  thyroid is not enlarged.  There is no lymphadenopathy.  The chest is clear to percussion and auscultation.  There are no rales or rhonchi.  Expansion of the chest is symmetrical.  The precordium is quiet.  The first heart sound is normal.  The second heart sound is physiologically split.  There is no murmur gallop rub or click.  There is no abnormal lift or heave.  The abdomen is soft and nontender.  The bowel sounds are normal.  The liver and spleen are not enlarged.  There are no abdominal masses.  There are no abdominal bruits.  Extremities reveal good pedal pulses.  There is no phlebitis or edema.  There is no cyanosis or clubbing.  Strength is normal and symmetrical in all extremities.  There is no lateralizing weakness.  There are no sensory deficits.  The skin is warm and dry.  There is no rash. EKG today shows normal sinus rhythm and nonspecific ST-T wave changes and since previous tracing of 08/20/12, no significant change   Assessment / Plan: Continue same medication.  Check in 6 months for followup office visit.  His weight has been stable since last visit.

## 2013-08-19 NOTE — Assessment & Plan Note (Signed)
Patient has not been experiencing any recurrent chest pain or angina. 

## 2013-08-19 NOTE — Patient Instructions (Signed)
Your physician recommends that you continue on your current medications as directed. Please refer to the Current Medication list given to you today.  Your physician wants you to follow-up in: 6 month ov You will receive a reminder letter in the mail two months in advance. If you don't receive a letter, please call our office to schedule the follow-up appointment.  

## 2013-08-19 NOTE — Assessment & Plan Note (Signed)
Patient is not experiencing any myalgias from the simvastatin.

## 2013-08-30 ENCOUNTER — Other Ambulatory Visit: Payer: Self-pay | Admitting: Internal Medicine

## 2013-08-31 ENCOUNTER — Other Ambulatory Visit: Payer: Self-pay | Admitting: Internal Medicine

## 2013-09-10 DIAGNOSIS — W57XXXA Bitten or stung by nonvenomous insect and other nonvenomous arthropods, initial encounter: Principal | ICD-10-CM

## 2013-09-10 DIAGNOSIS — S40261A Insect bite (nonvenomous) of right shoulder, initial encounter: Secondary | ICD-10-CM | POA: Insufficient documentation

## 2013-09-24 ENCOUNTER — Encounter: Payer: Self-pay | Admitting: Internal Medicine

## 2013-09-24 ENCOUNTER — Ambulatory Visit (INDEPENDENT_AMBULATORY_CARE_PROVIDER_SITE_OTHER): Payer: Medicare Other | Admitting: Internal Medicine

## 2013-09-24 VITALS — BP 120/70 | HR 85 | Temp 98.5°F | Resp 18 | Ht 70.0 in | Wt 213.8 lb

## 2013-09-24 DIAGNOSIS — S40269A Insect bite (nonvenomous) of unspecified shoulder, initial encounter: Secondary | ICD-10-CM

## 2013-09-24 DIAGNOSIS — R0989 Other specified symptoms and signs involving the circulatory and respiratory systems: Secondary | ICD-10-CM | POA: Insufficient documentation

## 2013-09-24 DIAGNOSIS — S40261A Insect bite (nonvenomous) of right shoulder, initial encounter: Secondary | ICD-10-CM

## 2013-09-24 DIAGNOSIS — W57XXXA Bitten or stung by nonvenomous insect and other nonvenomous arthropods, initial encounter: Principal | ICD-10-CM

## 2013-09-24 DIAGNOSIS — I10 Essential (primary) hypertension: Secondary | ICD-10-CM

## 2013-09-24 MED ORDER — DOXYCYCLINE HYCLATE 50 MG PO CAPS: ORAL_CAPSULE | ORAL | Status: DC

## 2013-09-24 NOTE — Progress Notes (Signed)
Patient ID: Dean Cole, male   DOB: 1951-04-05, 63 y.o.   MRN: [id number]    Location:  [facility]  Place of Service: OFFICE   Allergies  Allergen Reactions  . Adhesive [Tape] Swelling    Chief Complaint  Patient presents with  . Acute Visit    fever, chills    HPI:  Tick bite of right shoulder: about 09/10/13. Fever to 102 started about 2 days after. Fever stopped about a week later. No significant increase in headache. No rash. Had congestion at the same time as the fever. Still has a cough and congestion. Denies use of OTC meds other than Tylenol.  HTN (hypertension): controlled.    Medications: Patient's Medications  New Prescriptions   No medications on file  Previous Medications   ASPIRIN EC 325 MG EC TABLET    Take 1 tablet (325 mg total) by mouth daily.   DIAZEPAM (VALIUM) 5 MG TABLET    Take 1 tablet 4 times a day as needed for nerves   FUROSEMIDE (LASIX) 40 MG TABLET    Take 1 tablet (40 mg total) by mouth daily as needed.   IMIPRAMINE (TOFRANIL) 25 MG TABLET    TAKE 3 TABLETS BY MOUTH EVERY MORNING AND 4 TABLET AT BEDTIME   LEVOTHYROXINE (SYNTHROID, LEVOTHROID) 150 MCG TABLET    TAKE 1 TABLET (150 MCG TOTAL) BY MOUTH DAILY.   METOPROLOL TARTRATE (LOPRESSOR) 25 MG TABLET    TAKE 1 TABLET TWICE A DAY   MOMETASONE (ELOCON) 0.1 % CREAM    USE DAILY TO CONTROL ECZEMA   NITROSTAT 0.4 MG SL TABLET    Place 0.4 mg under the tongue every 5 (five) minutes as needed. For chest pain   PENTAZOCINE-NALOXONE (TALWIN NX) 50-0.5 MG PER TABLET    Take 1 tablet by mouth every 4 (four) hours as needed for pain.   POLYETHYLENE GLYCOL (MIRALAX / GLYCOLAX) PACKET    Take 17 g by mouth daily. As needed   SIMVASTATIN (ZOCOR) 20 MG TABLET    Take 1 tablet (20 mg total) by mouth daily.  Modified Medications   No medications on file  Discontinued Medications   No medications on file     Review of Systems  Constitutional: Positive for fatigue. Negative for fever, chills, activity  change, appetite change and unexpected weight change.  HENT: Positive for congestion, hearing loss and rhinorrhea. Negative for ear pain and tinnitus.   Eyes: Negative.   Respiratory: Positive for cough and wheezing. Negative for apnea, choking, chest tightness and shortness of breath.   Cardiovascular: Negative for chest pain, palpitations and leg swelling.  Gastrointestinal: Positive for constipation.  Endocrine: Negative for cold intolerance, heat intolerance, polydipsia, polyphagia and polyuria.       Diabetic. Hypothyroid on supplements.  Genitourinary:       Nocturia.  Musculoskeletal: Positive for arthralgias.  Skin:       Chronic dry skin. Healing area of tick bite right shoulder.  Allergic/Immunologic: Negative.   Neurological:       Recurrent tension headaches.  Hematological: Negative.   Psychiatric/Behavioral:        Chronic anxiety. Some depressive symptoms. History of insomnia.    Filed Vitals:   09/24/13 1157  BP: 120/70  Pulse: 85  Temp: 98.5 F (36.9 C)  TempSrc: Oral  Resp: 18  Height: 5\' 10"  (1.778 m)  Weight: 213 lb 12.8 oz (96.979 kg)  SpO2: 96%   Body mass index is 30.68 kg/(m^2).  Physical Exam  Constitutional: He is oriented to person, place, and time. He appears well-developed and well-nourished. No distress.  HENT:  Head: Normocephalic and atraumatic.  Right Ear: External ear normal.  Left Ear: External ear normal.  Nose: Nose normal.  Mouth/Throat: Oropharynx is clear and moist.  Mild loss of hearing.  Eyes: Conjunctivae and EOM are normal. Pupils are equal, round, and reactive to light.  Neck:  Upper and lower dentures. Stud in the tongue.  Cardiovascular: Normal rate, regular rhythm, normal heart sounds and intact distal pulses.  Exam reveals no gallop and no friction rub.   No murmur heard. Pulmonary/Chest: No respiratory distress. He has no wheezes. He has no rales. He exhibits no tenderness.  Abdominal: He exhibits no distension and  no mass. There is no tenderness.  Musculoskeletal: Normal range of motion. He exhibits edema. He exhibits no tenderness.  Neurological: He is alert and oriented to person, place, and time. He has normal reflexes. No cranial nerve deficit. Coordination normal.  Skin: No rash noted. No erythema. No pallor.  Multiple tattoos. Multiple piercings. Shave/moles. 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 Polypoid growth of the left lower eyelid. Healing sore right shoulder.  Psychiatric: He has a normal mood and affect. His behavior is normal. Thought content normal.     Labs reviewed: Office Visit on 07/08/2013  Component Date Value Ref Range Status  . HM Colonoscopy 05/05/2003 [facility], removed hemorroids.   Final      Assessment/Plan  1. Tick bite of right shoulder Use antibiotic if fever reoccurs - doxycycline (VIBRAMYCIN) 50 MG capsule; One twice daily for infection  Dispense: 20 capsule; Refill: 0  2. HTN (hypertension) controlled  3. Chest congestion I think this was the more likely source of recent fever.

## 2013-09-24 NOTE — Patient Instructions (Signed)
Start antibiotic if fevers reoccur.

## 2013-10-26 ENCOUNTER — Other Ambulatory Visit: Payer: Self-pay | Admitting: Cardiology

## 2013-11-06 ENCOUNTER — Other Ambulatory Visit: Payer: Medicare Other

## 2013-11-06 DIAGNOSIS — R748 Abnormal levels of other serum enzymes: Secondary | ICD-10-CM

## 2013-11-06 DIAGNOSIS — E785 Hyperlipidemia, unspecified: Secondary | ICD-10-CM

## 2013-11-06 DIAGNOSIS — I10 Essential (primary) hypertension: Secondary | ICD-10-CM

## 2013-11-06 DIAGNOSIS — R7303 Prediabetes: Secondary | ICD-10-CM

## 2013-11-07 LAB — LIPID PANEL
CHOL/HDL RATIO: 5.4 ratio — AB (ref 0.0–5.0)
Cholesterol, Total: 135 mg/dL (ref 100–199)
HDL: 25 mg/dL — AB (ref >39)
LDL Calculated: 56 mg/dL (ref 0–99)
TRIGLYCERIDES: 270 mg/dL — AB (ref 0–149)
VLDL Cholesterol Cal: 54 mg/dL — ABNORMAL HIGH (ref 5–40)

## 2013-11-07 LAB — COMPREHENSIVE METABOLIC PANEL
ALT: 27 IU/L (ref 0–44)
AST: 37 IU/L (ref 0–40)
Albumin/Globulin Ratio: 1.6 (ref 1.1–2.5)
Albumin: 4.4 g/dL (ref 3.6–4.8)
Alkaline Phosphatase: 148 IU/L — ABNORMAL HIGH (ref 39–117)
BILIRUBIN TOTAL: 0.4 mg/dL (ref 0.0–1.2)
BUN/Creatinine Ratio: 7 — ABNORMAL LOW (ref 10–22)
BUN: 9 mg/dL (ref 8–27)
CALCIUM: 9.4 mg/dL (ref 8.6–10.2)
CO2: 28 mmol/L (ref 18–29)
CREATININE: 1.27 mg/dL (ref 0.76–1.27)
Chloride: 98 mmol/L (ref 97–108)
GFR calc Af Amer: 70 mL/min/{1.73_m2} (ref >59)
GFR, EST NON AFRICAN AMERICAN: 60 mL/min/{1.73_m2} (ref >59)
GLOBULIN, TOTAL: 2.8 g/dL (ref 1.5–4.5)
GLUCOSE: 125 mg/dL — AB (ref 65–99)
POTASSIUM: 4.2 mmol/L (ref 3.5–5.2)
SODIUM: 140 mmol/L (ref 134–144)
TOTAL PROTEIN: 7.2 g/dL (ref 6.0–8.5)

## 2013-11-07 LAB — HEMOGLOBIN A1C
ESTIMATED AVERAGE GLUCOSE: 146 mg/dL
HEMOGLOBIN A1C: 6.7 % — AB (ref 4.8–5.6)

## 2013-11-11 ENCOUNTER — Ambulatory Visit (INDEPENDENT_AMBULATORY_CARE_PROVIDER_SITE_OTHER): Payer: Medicare Other | Admitting: Internal Medicine

## 2013-11-11 ENCOUNTER — Encounter: Payer: Self-pay | Admitting: Internal Medicine

## 2013-11-11 VITALS — BP 138/80 | HR 73 | Temp 98.3°F | Wt 209.8 lb

## 2013-11-11 DIAGNOSIS — I10 Essential (primary) hypertension: Secondary | ICD-10-CM

## 2013-11-11 DIAGNOSIS — R7309 Other abnormal glucose: Secondary | ICD-10-CM

## 2013-11-11 DIAGNOSIS — F419 Anxiety disorder, unspecified: Secondary | ICD-10-CM

## 2013-11-11 DIAGNOSIS — E785 Hyperlipidemia, unspecified: Secondary | ICD-10-CM

## 2013-11-11 DIAGNOSIS — E039 Hypothyroidism, unspecified: Secondary | ICD-10-CM

## 2013-11-11 DIAGNOSIS — R51 Headache: Secondary | ICD-10-CM

## 2013-11-11 DIAGNOSIS — Z9889 Other specified postprocedural states: Secondary | ICD-10-CM

## 2013-11-11 DIAGNOSIS — I2510 Atherosclerotic heart disease of native coronary artery without angina pectoris: Secondary | ICD-10-CM

## 2013-11-11 DIAGNOSIS — R748 Abnormal levels of other serum enzymes: Secondary | ICD-10-CM

## 2013-11-11 DIAGNOSIS — F411 Generalized anxiety disorder: Secondary | ICD-10-CM

## 2013-11-11 DIAGNOSIS — R7303 Prediabetes: Secondary | ICD-10-CM

## 2013-11-11 MED ORDER — PENTAZOCINE-NALOXONE 50-0.5 MG PO TABS: 1.0000 | ORAL_TABLET | ORAL | Status: DC | PRN

## 2013-11-11 MED ORDER — DIAZEPAM 5 MG PO TABS: ORAL_TABLET | ORAL | Status: DC

## 2013-11-11 MED ORDER — IMIPRAMINE HCL 25 MG PO TABS: ORAL_TABLET | ORAL | Status: DC

## 2013-11-11 NOTE — Progress Notes (Signed)
Patient ID: Dean Cole, male   DOB: 04-Nov-1950, 63 y.o.   MRN: [id number]    Location:    [facility]  Place of Service:  OFFICE    Allergies  Allergen Reactions  . Adhesive [Tape] Swelling    Chief Complaint  Patient presents with  . Follow-up    4 month f/u and discuss [employer] form, MMSE  . other    due for colonoscopy, last 2005    HPI:  Anxiety - continues to benefit from diazepam (VALIUM) 5 MG tablet and imipramine (TOFRANIL) 25 MG tablet  Essential hypertension - controlled  Hypothyroidism, unspecified hypothyroidism type - compensated  Coronary artery disease involving native coronary artery of native heart without angina pectoris: Stable  Elevated liver enzymes: Mild elevation in alkaline phosphatase, Parenchymal enzymes are normal.  Headache - continues to benefit from pentazocine-naloxone (TALWIN NX) 50-0.5 MG per tablet  History of colonoscopy - needs ambulatory referral to Gastroenterology  Other and unspecified hyperlipidemia - controlled  Borderline diabetes - unchanged. Recent A1c 6.7.    Medications: Patient's Medications  New Prescriptions   No medications on file  Previous Medications   ASPIRIN EC 325 MG EC TABLET    Take 1 tablet (325 mg total) by mouth daily.   DIAZEPAM (VALIUM) 5 MG TABLET    Take 1 tablet 4 times a day as needed for nerves   FUROSEMIDE (LASIX) 40 MG TABLET    Take 1 tablet (40 mg total) by mouth daily as needed.   IMIPRAMINE (TOFRANIL) 25 MG TABLET    TAKE 3 TABLETS BY MOUTH EVERY MORNING AND 4 TABLET AT BEDTIME   LEVOTHYROXINE (SYNTHROID, LEVOTHROID) 150 MCG TABLET    TAKE 1 TABLET (150 MCG TOTAL) BY MOUTH DAILY.   METOPROLOL TARTRATE (LOPRESSOR) 25 MG TABLET    TAKE 1 TABLET TWICE A DAY   MOMETASONE (ELOCON) 0.1 % CREAM    USE DAILY TO CONTROL ECZEMA   NITROSTAT 0.4 MG SL TABLET    Place 0.4 mg under the tongue every 5 (five) minutes as needed. For chest pain   PENTAZOCINE-NALOXONE (TALWIN NX) 50-0.5 MG PER TABLET    Take  1 tablet by mouth every 4 (four) hours as needed for pain.   POLYETHYLENE GLYCOL (MIRALAX / GLYCOLAX) PACKET    Take 17 g by mouth daily. As needed   SIMVASTATIN (ZOCOR) 20 MG TABLET    Take 1 tablet (20 mg total) by mouth daily.  Modified Medications   No medications on file  Discontinued Medications   DOXYCYCLINE (VIBRAMYCIN) 50 MG CAPSULE    One twice daily for infection     Review of Systems  Constitutional: Positive for fatigue. Negative for fever, chills, activity change, appetite change and unexpected weight change.  HENT: Positive for hearing loss. Negative for congestion, ear pain, rhinorrhea and tinnitus.   Eyes: Negative.   Respiratory: Negative for apnea, cough, choking, chest tightness, shortness of breath and wheezing.   Cardiovascular: Negative for chest pain, palpitations and leg swelling.  Gastrointestinal: Positive for constipation.  Endocrine: Negative for cold intolerance, heat intolerance, polydipsia, polyphagia and polyuria.       Diabetic. Hypothyroid on supplements.  Genitourinary:       Nocturia.  Musculoskeletal: Positive for arthralgias.  Skin:       Chronic dry skin. Healing area of tick bite right shoulder.  Allergic/Immunologic: Negative.   Neurological:       Recurrent tension headaches.  Hematological: Negative.   Psychiatric/Behavioral:  Chronic anxiety. Some depressive symptoms. History of insomnia.    Filed Vitals:   11/11/13 1455  BP: 138/80  Pulse: 73  Temp: 98.3 F (36.8 C)  TempSrc: Oral  Weight: 209 lb 12.8 oz (95.165 kg)  SpO2: 98%   Body mass index is 30.1 kg/(m^2).  Physical Exam  Constitutional: He is oriented to person, place, and time. He appears well-developed and well-nourished. No distress.  HENT:  Head: Normocephalic and atraumatic.  Right Ear: External ear normal.  Left Ear: External ear normal.  Nose: Nose normal.  Mouth/Throat: Oropharynx is clear and moist.  Mild loss of hearing.  Eyes: Conjunctivae and  EOM are normal. Pupils are equal, round, and reactive to light.  Neck:  Upper and lower dentures. Stud in the tongue.  Cardiovascular: Normal rate, regular rhythm, normal heart sounds and intact distal pulses.  Exam reveals no gallop and no friction rub.   No murmur heard. Pulmonary/Chest: No respiratory distress. He has no wheezes. He has no rales. He exhibits no tenderness.  Abdominal: He exhibits no distension and no mass. There is no tenderness.  Musculoskeletal: Normal range of motion. He exhibits edema. He exhibits no tenderness.  Neurological: He is alert and oriented to person, place, and time. He has normal reflexes. No cranial nerve deficit. Coordination normal.  Skin: No rash noted. No erythema. No pallor.  Multiple tattoos. Multiple piercings. Shave/moles. 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 Polypoid growth of the left lower eyelid. Healing sore right shoulder.  Psychiatric: He has a normal mood and affect. His behavior is normal. Thought content normal.     Labs reviewed: Appointment on 11/06/2013  Component Date Value Ref Range Status  . Hemoglobin A1C 11/06/2013 6.7* 4.8 - 5.6 % Final   Comment:          Increased risk for diabetes: 5.7 - 6.4                                   Diabetes: >6.4                                   Glycemic control for adults with diabetes: <7.0  . Estimated average glucose 11/06/2013 146   Final  . Cholesterol, Total 11/06/2013 135  100 - 199 mg/dL Final  . Triglycerides 11/06/2013 270* 0 - 149 mg/dL Final  . HDL 11/06/2013 25* >39 mg/dL Final   Comment: According to ATP-III Guidelines, HDL-C >59 mg/dL is considered a                          negative risk factor for CHD.  Marland Kitchen VLDL Cholesterol Cal 11/06/2013 54* 5 - 40 mg/dL Final  . LDL Calculated 11/06/2013 56  0 - 99 mg/dL Final  . Chol/HDL Ratio 11/06/2013 5.4* 0.0 - 5.0 ratio units Final   Comment:                                   T. Chol/HDL Ratio  Men  Women                                                        1/2 Avg.Risk  3.4    3.3                                                            Avg.Risk  5.0    4.4                                                         2X Avg.Risk  9.6    7.1                                                         3X Avg.Risk 23.4   11.0  . Glucose 11/06/2013 125* 65 - 99 mg/dL Final  . BUN 11/06/2013 9  8 - 27 mg/dL Final  . Creatinine, Ser 11/06/2013 1.27  0.76 - 1.27 mg/dL Final  . GFR calc non Af Amer 11/06/2013 60  >59 mL/min/1.73 Final  . GFR calc Af Amer 11/06/2013 70  >59 mL/min/1.73 Final  . BUN/Creatinine Ratio 11/06/2013 7* 10 - 22 Final  . Sodium 11/06/2013 140  134 - 144 mmol/L Final  . Potassium 11/06/2013 4.2  3.5 - 5.2 mmol/L Final  . Chloride 11/06/2013 98  97 - 108 mmol/L Final  . CO2 11/06/2013 28  18 - 29 mmol/L Final  . Calcium 11/06/2013 9.4  8.6 - 10.2 mg/dL Final  . Total Protein 11/06/2013 7.2  6.0 - 8.5 g/dL Final  . Albumin 11/06/2013 4.4  3.6 - 4.8 g/dL Final  . Globulin, Total 11/06/2013 2.8  1.5 - 4.5 g/dL Final  . Albumin/Globulin Ratio 11/06/2013 1.6  1.1 - 2.5 Final  . Total Bilirubin 11/06/2013 0.4  0.0 - 1.2 mg/dL Final  . Alkaline Phosphatase 11/06/2013 148* 39 - 117 IU/L Final  . AST 11/06/2013 37  0 - 40 IU/L Final  . ALT 11/06/2013 27  0 - 44 IU/L Final      Assessment/Plan  Anxiety - Plan: diazepam (VALIUM) 5 MG tablet, imipramine (TOFRANIL) 25 MG tablet  Essential hypertension - Plan: Comprehensive metabolic panel  Hypothyroidism, unspecified hypothyroidism type - Plan: TSH  Coronary artery disease involving native coronary artery of native heart without angina pectoris: Stable  Elevated liver enzymes: Stable  Headache - Plan: pentazocine-naloxone (TALWIN NX) 50-0.5 MG per tablet  History of colonoscopy - Plan: Ambulatory referral to Gastroenterology  Other and unspecified hyperlipidemia -  Plan: Lipid panel  Borderline diabetes - Plan: Hemoglobin A1c, Comprehensive metabolic panel

## 2013-11-11 NOTE — Progress Notes (Signed)
Passed clock drawing 

## 2013-11-11 NOTE — Patient Instructions (Addendum)
Continue current medications. 

## 2013-11-13 ENCOUNTER — Encounter: Payer: Self-pay | Admitting: Gastroenterology

## 2013-12-08 ENCOUNTER — Other Ambulatory Visit: Payer: Self-pay | Admitting: Internal Medicine

## 2013-12-22 ENCOUNTER — Other Ambulatory Visit: Payer: Self-pay | Admitting: Internal Medicine

## 2014-01-21 ENCOUNTER — Other Ambulatory Visit: Payer: Self-pay | Admitting: Internal Medicine

## 2014-01-26 ENCOUNTER — Ambulatory Visit: Payer: Medicare Other | Admitting: Gastroenterology

## 2014-02-19 ENCOUNTER — Other Ambulatory Visit: Payer: Self-pay | Admitting: Internal Medicine

## 2014-02-23 ENCOUNTER — Ambulatory Visit (INDEPENDENT_AMBULATORY_CARE_PROVIDER_SITE_OTHER): Payer: Medicare Other | Admitting: Cardiology

## 2014-02-23 ENCOUNTER — Encounter: Payer: Self-pay | Admitting: Cardiology

## 2014-02-23 VITALS — BP 126/76 | HR 69 | Ht 70.0 in | Wt 213.0 lb

## 2014-02-23 DIAGNOSIS — I2583 Coronary atherosclerosis due to lipid rich plaque: Secondary | ICD-10-CM

## 2014-02-23 DIAGNOSIS — E785 Hyperlipidemia, unspecified: Secondary | ICD-10-CM

## 2014-02-23 DIAGNOSIS — I471 Supraventricular tachycardia: Secondary | ICD-10-CM

## 2014-02-23 DIAGNOSIS — E7800 Pure hypercholesterolemia, unspecified: Secondary | ICD-10-CM

## 2014-02-23 DIAGNOSIS — I2510 Atherosclerotic heart disease of native coronary artery without angina pectoris: Secondary | ICD-10-CM

## 2014-02-23 DIAGNOSIS — I10 Essential (primary) hypertension: Secondary | ICD-10-CM

## 2014-02-23 DIAGNOSIS — Z8679 Personal history of other diseases of the circulatory system: Secondary | ICD-10-CM

## 2014-02-23 DIAGNOSIS — I259 Chronic ischemic heart disease, unspecified: Secondary | ICD-10-CM

## 2014-02-23 NOTE — Assessment & Plan Note (Signed)
The patient has not been experiencing any recurrent chest pain or angina. 

## 2014-02-23 NOTE — Assessment & Plan Note (Signed)
The patient has not had any episodes of SVT since previous visit

## 2014-02-23 NOTE — Assessment & Plan Note (Signed)
The patient is on simvastatin 20 mg daily.  His lipids are followed by Dr. Nyoka Cowden.  The patient is not having any myalgias from his

## 2014-02-23 NOTE — Patient Instructions (Signed)
Your physician recommends that you continue on your current medications as directed. Please refer to the Current Medication list given to you today.  Your physician wants you to follow-up in: 6 month ov/ekg You will receive a reminder letter in the mail two months in advance. If you don't receive a letter, please call our office to schedule the follow-up appointment.  

## 2014-02-23 NOTE — Assessment & Plan Note (Signed)
The patient is not having any dizziness or syncope.  No symptoms of CHF.

## 2014-02-23 NOTE — Progress Notes (Signed)
Dean Cole Date of Birth:  [birth year] [place] [address] [place], [place]  [address] [phone]        Fax   [phone]   History of Present Illness: This pleasant 63 year old gentleman is seen back for a scheduled followup office visit. He has a history of ischemic heart disease and underwent coronary artery bypass grafting x4 on 07/10/11 by Dr. Prescott Gum. He had severe multivessel coronary artery disease. Patient also has a history of high blood pressure, diabetes, hypothyroidism, and mild renal insufficiency. Since his heart surgery in March 2013 the patient was readmitted to the hospital 6 weeks later with acute cholecystitis and underwent cholecystectomy by Dr. Hulen Skains. Since then the patient has been feeling well except for problems with mild fluid retention. He has noted occasional ankle edema. He has to take a Lasix one to 3 times per month for mild edema. . Previously he had been chewing tobacco but he has not used tobacco since his bypass surgery. He does not drink any alcohol. The patient has hypercholesterolemia and is on and is on simvastatin 20 mg daily. Dr. Carlota Raspberry checks his labs. The patient has a past history of paroxysmal supraventricular tachycardia.   Current Outpatient Prescriptions  Medication Sig Dispense Refill  . aspirin EC 325 MG EC tablet Take 1 tablet (325 mg total) by mouth daily.  30 tablet    . diazepam (VALIUM) 5 MG tablet TAKE 1 TABLET BY MOUTH FOUR TIMES A DAY AS NEEDED FOR NERVES  120 tablet  0  . furosemide (LASIX) 40 MG tablet Take 1 tablet (40 mg total) by mouth daily as needed.  30 tablet  5  . imipramine (TOFRANIL) 25 MG tablet TAKE 3 TABLETS BY MOUTH EVERY MORNING AND 4 TABLET AT BEDTIME  210 tablet  3  . levothyroxine (SYNTHROID, LEVOTHROID) 150 MCG tablet TAKE 1 TABLET (150 MCG TOTAL) BY MOUTH DAILY.  90 tablet  0  . metoprolol tartrate (LOPRESSOR) 25 MG tablet TAKE 1 TABLET TWICE A DAY  180 tablet  1  .  mometasone (ELOCON) 0.1 % cream USE DAILY TO CONTROL ECZEMA  45 g  0  . NITROSTAT 0.4 MG SL tablet Place 0.4 mg under the tongue every 5 (five) minutes as needed. For chest pain      . pentazocine-naloxone (TALWIN NX) 50-0.5 MG per tablet Take 1 tablet by mouth every 4 (four) hours as needed for pain.  90 tablet  0  . polyethylene glycol (MIRALAX / GLYCOLAX) packet Take 17 g by mouth daily. As needed      . simvastatin (ZOCOR) 20 MG tablet Take 1 tablet (20 mg total) by mouth daily.  90 tablet  3   No current facility-administered medications for this visit.    Allergies  Allergen Reactions  . Adhesive [Tape] Swelling    Patient Active Problem List   Diagnosis Date Noted  . Chest congestion 09/24/2013  . Elevated liver enzymes 07/08/2013  . Eczema 12/25/2012  . Anxiety   . Dyslipidemia 02/29/2012  . Postop check 09/22/2011  . PSVT (paroxysmal supraventricular tachycardia) 07/14/2011  . Unstable angina 07/08/2011  . HTN (hypertension)   . Hypothyroidism   . Coronary artery disease   . Borderline diabetes   . Cancer   . Headache   . Restless leg syndrome     History  Smoking status  . Former Smoker  . Quit date: 08/08/1981  Smokeless tobacco  . Former Systems developer  History  Alcohol Use No    Comment: former"    Family History  Problem Relation Age of Onset  . Coronary artery disease Father   . Coronary artery disease Paternal Grandfather   . Suicidality Brother     Review of Systems: Constitutional: no fever chills diaphoresis or fatigue or change in weight.  Head and neck: no hearing loss, no epistaxis, no photophobia or visual disturbance. Respiratory: No cough, shortness of breath or wheezing. Cardiovascular: No chest pain peripheral edema, palpitations. Gastrointestinal: No abdominal distention, no abdominal pain, no change in bowel habits hematochezia or melena. Genitourinary: No dysuria, no frequency, no urgency, no nocturia. Musculoskeletal:No arthralgias,  no back pain, no gait disturbance or myalgias. Neurological: No dizziness, no headaches, no numbness, no seizures, no syncope, no weakness, no tremors. Hematologic: No lymphadenopathy, no easy bruising. Psychiatric: No confusion, no hallucinations, no sleep disturbance.    Physical Exam: Filed Vitals:   02/23/14 1059  BP: 126/76  Pulse: 69  The patient appears to be in no distress.  Head and neck exam reveals that the pupils are equal and reactive.  The extraocular movements are full.  There is no scleral icterus.  Mouth and pharynx are benign.  No lymphadenopathy.  No carotid bruits.  The jugular venous pressure is normal.  Thyroid is not enlarged or tender.  Chest is clear to percussion and auscultation.  No rales or rhonchi.  Expansion of the chest is symmetrical.  Heart reveals no abnormal lift or heave.  First and second heart sounds are normal.  There is no murmur gallop rub or click.  The abdomen is soft and nontender.  Bowel sounds are normoactive.  There is no hepatosplenomegaly or mass.  There are no abdominal bruits.  Extremities reveal no phlebitis or edema.  Pedal pulses are good.  There is no cyanosis or clubbing.  Neurologic exam is normal strength and no lateralizing weakness.  No sensory deficits.  Integument reveals no rash    Assessment / Plan: 1. ischemic heart disease status post CABG x4 on 07/10/11 by Dr. Prescott Gum 2.  Status post cholecystectomy 3. essential hypertension without heart failure 4. diabetes mellitus, adult onset 5.  Hypothyroidism 6. mild renal insufficiency  Disposition: Continue current medication.  He will be getting fasting lab work at Dr. Gaetano Hawthorne office. Recheck here in 6 months for office visit and EKG. His weight is up 4 pounds since last visit.  Encouraged him regarding the importance of careful diet and weight loss. He does not drink any alcohol and he has not smoked for 30 years.  He chewed tobacco until his bypass  surgery

## 2014-03-02 ENCOUNTER — Other Ambulatory Visit: Payer: Medicare Other

## 2014-03-03 ENCOUNTER — Other Ambulatory Visit: Payer: Medicare Other

## 2014-03-03 DIAGNOSIS — E039 Hypothyroidism, unspecified: Secondary | ICD-10-CM

## 2014-03-03 DIAGNOSIS — E785 Hyperlipidemia, unspecified: Secondary | ICD-10-CM

## 2014-03-03 DIAGNOSIS — I10 Essential (primary) hypertension: Secondary | ICD-10-CM

## 2014-03-03 DIAGNOSIS — R7303 Prediabetes: Secondary | ICD-10-CM

## 2014-03-04 ENCOUNTER — Encounter: Payer: Self-pay | Admitting: Internal Medicine

## 2014-03-04 ENCOUNTER — Ambulatory Visit (INDEPENDENT_AMBULATORY_CARE_PROVIDER_SITE_OTHER): Payer: Medicare Other | Admitting: Internal Medicine

## 2014-03-04 VITALS — BP 124/78 | HR 68 | Temp 97.5°F | Resp 12 | Ht 70.0 in | Wt 209.0 lb

## 2014-03-04 DIAGNOSIS — G43809 Other migraine, not intractable, without status migrainosus: Secondary | ICD-10-CM

## 2014-03-04 DIAGNOSIS — E1129 Type 2 diabetes mellitus with other diabetic kidney complication: Secondary | ICD-10-CM | POA: Insufficient documentation

## 2014-03-04 DIAGNOSIS — E785 Hyperlipidemia, unspecified: Secondary | ICD-10-CM

## 2014-03-04 DIAGNOSIS — IMO0002 Reserved for concepts with insufficient information to code with codable children: Secondary | ICD-10-CM

## 2014-03-04 DIAGNOSIS — R748 Abnormal levels of other serum enzymes: Secondary | ICD-10-CM

## 2014-03-04 DIAGNOSIS — G44229 Chronic tension-type headache, not intractable: Secondary | ICD-10-CM

## 2014-03-04 DIAGNOSIS — E039 Hypothyroidism, unspecified: Secondary | ICD-10-CM

## 2014-03-04 DIAGNOSIS — I10 Essential (primary) hypertension: Secondary | ICD-10-CM

## 2014-03-04 DIAGNOSIS — E1165 Type 2 diabetes mellitus with hyperglycemia: Secondary | ICD-10-CM

## 2014-03-04 LAB — LIPID PANEL
CHOL/HDL RATIO: 6.2 ratio — AB (ref 0.0–5.0)
CHOLESTEROL TOTAL: 180 mg/dL (ref 100–199)
HDL: 29 mg/dL — ABNORMAL LOW (ref >39)
LDL CALC: 89 mg/dL (ref 0–99)
TRIGLYCERIDES: 311 mg/dL — AB (ref 0–149)
VLDL Cholesterol Cal: 62 mg/dL — ABNORMAL HIGH (ref 5–40)

## 2014-03-04 LAB — COMPREHENSIVE METABOLIC PANEL
ALBUMIN: 4.6 g/dL (ref 3.6–4.8)
ALK PHOS: 134 IU/L — AB (ref 39–117)
ALT: 31 IU/L (ref 0–44)
AST: 55 IU/L — ABNORMAL HIGH (ref 0–40)
Albumin/Globulin Ratio: 1.8 (ref 1.1–2.5)
BUN / CREAT RATIO: 9 — AB (ref 10–22)
BUN: 12 mg/dL (ref 8–27)
CO2: 25 mmol/L (ref 18–29)
CREATININE: 1.29 mg/dL — AB (ref 0.76–1.27)
Calcium: 9.3 mg/dL (ref 8.6–10.2)
Chloride: 97 mmol/L (ref 97–108)
GFR calc Af Amer: 68 mL/min/{1.73_m2} (ref >59)
GFR calc non Af Amer: 59 mL/min/{1.73_m2} — ABNORMAL LOW (ref >59)
GLOBULIN, TOTAL: 2.5 g/dL (ref 1.5–4.5)
GLUCOSE: 142 mg/dL — AB (ref 65–99)
Potassium: 4.2 mmol/L (ref 3.5–5.2)
Sodium: 139 mmol/L (ref 134–144)
TOTAL PROTEIN: 7.1 g/dL (ref 6.0–8.5)
Total Bilirubin: 0.7 mg/dL (ref 0.0–1.2)

## 2014-03-04 LAB — HEMOGLOBIN A1C
Est. average glucose Bld gHb Est-mCnc: 163 mg/dL
HEMOGLOBIN A1C: 7.3 % — AB (ref 4.8–5.6)

## 2014-03-04 LAB — TSH: TSH: 3.76 u[IU]/mL (ref 0.450–4.500)

## 2014-03-04 MED ORDER — PENTAZOCINE-NALOXONE 50-0.5 MG PO TABS: 1.0000 | ORAL_TABLET | ORAL | Status: DC | PRN

## 2014-03-04 NOTE — Progress Notes (Signed)
Patient ID: Dean Cole, male   DOB: 1951/02/04, 63 y.o.   MRN: [id number]    Facility  [facility]    Place of Service:   OFFICE   Allergies  Allergen Reactions  . Adhesive [Tape] Swelling  . Eggs Or Egg-Derived Products Other (See Comments)    Bloating, nausea, and no rash     Chief Complaint  Patient presents with  . Medical Management of Chronic Issues    4 month follow-up, disucss labs (copy printed)   . Immunizations    Discuss flu vaccine, allergic to eggs    HPI:   Type II diabetes mellitus with renal manifestations, uncontrolled: lab is a little worse than last visit. Noncompliant with diet now, but willing to make better food choices.  Dyslipidemia: worse  Essential hypertension: controlled  Hypothyroidism, unspecified hypothyroidism type: compensated  Elevated liver enzymes: most likely related to hepatic steatosis  Tension headaches: stable. Stressed by having 5 children to care for in the house. All are adopted or are family related grandchildren.    Medications: Patient's Medications  New Prescriptions   No medications on file  Previous Medications   ASPIRIN EC 325 MG EC TABLET    Take 1 tablet (325 mg total) by mouth daily.   DIAZEPAM (VALIUM) 5 MG TABLET    TAKE 1 TABLET BY MOUTH FOUR TIMES A DAY AS NEEDED FOR NERVES   FUROSEMIDE (LASIX) 40 MG TABLET    Take 1 tablet (40 mg total) by mouth daily as needed.   IMIPRAMINE (TOFRANIL) 25 MG TABLET    TAKE 3 TABLETS BY MOUTH EVERY MORNING AND 4 TABLET AT BEDTIME   LEVOTHYROXINE (SYNTHROID, LEVOTHROID) 150 MCG TABLET    TAKE 1 TABLET (150 MCG TOTAL) BY MOUTH DAILY.   METOPROLOL TARTRATE (LOPRESSOR) 25 MG TABLET    TAKE 1 TABLET TWICE A DAY   MOMETASONE (ELOCON) 0.1 % CREAM    USE DAILY TO CONTROL ECZEMA   NITROSTAT 0.4 MG SL TABLET    Place 0.4 mg under the tongue every 5 (five) minutes as needed. For chest pain   PENTAZOCINE-NALOXONE (TALWIN NX) 50-0.5 MG PER TABLET    Take 1 tablet by mouth every 4 (four)  hours as needed for pain.   POLYETHYLENE GLYCOL (MIRALAX / GLYCOLAX) PACKET    Take 17 g by mouth daily. As needed   SIMVASTATIN (ZOCOR) 20 MG TABLET    Take 1 tablet (20 mg total) by mouth daily.  Modified Medications   No medications on file  Discontinued Medications   No medications on file     Review of Systems  Constitutional: Positive for fatigue. Negative for fever, chills, activity change, appetite change and unexpected weight change.  HENT: Positive for hearing loss. Negative for congestion, ear pain, rhinorrhea and tinnitus.   Eyes: Negative.   Respiratory: Negative for apnea, cough, choking, chest tightness, shortness of breath and wheezing.   Cardiovascular: Negative for chest pain, palpitations and leg swelling.  Gastrointestinal: Positive for constipation.  Endocrine: Negative for cold intolerance, heat intolerance, polydipsia, polyphagia and polyuria.       Diabetic. Hypothyroid on supplements.  Genitourinary:       Nocturia.  Musculoskeletal: Positive for arthralgias.  Skin:       Chronic dry skin. Healing area of tick bite right shoulder.  Allergic/Immunologic: Negative.   Neurological:       Recurrent tension headaches.  Hematological: Negative.   Psychiatric/Behavioral:        Chronic anxiety. Some depressive  symptoms. History of insomnia.    Filed Vitals:   03/04/14 1111  BP: 124/78  Pulse: 68  Temp: 97.5 F (36.4 C)  TempSrc: Oral  Resp: 12  Height: 5\' 10"  (1.778 m)  Weight: 209 lb (94.802 kg)  SpO2: 97%   Body mass index is 29.99 kg/(m^2).  Physical Exam  Constitutional: He is oriented to person, place, and time. He appears well-developed and well-nourished. No distress.  HENT:  Head: Normocephalic and atraumatic.  Right Ear: External ear normal.  Left Ear: External ear normal.  Nose: Nose normal.  Mouth/Throat: Oropharynx is clear and moist.  Mild loss of hearing.  Eyes: Conjunctivae and EOM are normal. Pupils are equal, round, and  reactive to light.  Neck:  Upper and lower dentures. Stud in the tongue.  Cardiovascular: Normal rate, regular rhythm, normal heart sounds and intact distal pulses.  Exam reveals no gallop and no friction rub.   No murmur heard. Pulmonary/Chest: No respiratory distress. He has no wheezes. He has no rales. He exhibits no tenderness.  Abdominal: He exhibits no distension and no mass. There is no tenderness.  Musculoskeletal: Normal range of motion. He exhibits no edema or tenderness.  Neurological: He is alert and oriented to person, place, and time. He has normal reflexes. No cranial nerve deficit. Coordination normal.  Skin: No rash noted. No erythema. No pallor.  Multiple tattoos. Multiple piercings. Shave/moles. 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 Polypoid growth of the left lower eyelid.  Psychiatric: He has a normal mood and affect. His behavior is normal. Thought content normal.     Labs reviewed: Appointment on 03/03/2014  Component Date Value Ref Range Status  . Hgb A1c MFr Bld 03/03/2014 7.3* 4.8 - 5.6 % Final   Comment:          Pre-diabetes: 5.7 - 6.4          Diabetes: >6.4          Glycemic control for adults with diabetes: <7.0   . Est. average glucose Bld gHb Est-m* 03/03/2014 163   Final  . Glucose 03/03/2014 142* 65 - 99 mg/dL Final  . BUN 03/03/2014 12  8 - 27 mg/dL Final  . Creatinine, Ser 03/03/2014 1.29* 0.76 - 1.27 mg/dL Final  . GFR calc non Af Amer 03/03/2014 59* >59 mL/min/1.73 Final  . GFR calc Af Amer 03/03/2014 68  >59 mL/min/1.73 Final  . BUN/Creatinine Ratio 03/03/2014 9* 10 - 22 Final  . Sodium 03/03/2014 139  134 - 144 mmol/L Final  . Potassium 03/03/2014 4.2  3.5 - 5.2 mmol/L Final  . Chloride 03/03/2014 97  97 - 108 mmol/L Final  . CO2 03/03/2014 25  18 - 29 mmol/L Final  . Calcium 03/03/2014 9.3  8.6 - 10.2 mg/dL Final  . Total Protein 03/03/2014 7.1  6.0 - 8.5 g/dL Final  . Albumin 03/03/2014 4.6  3.6 - 4.8 g/dL Final  .  Globulin, Total 03/03/2014 2.5  1.5 - 4.5 g/dL Final  . Albumin/Globulin Ratio 03/03/2014 1.8  1.1 - 2.5 Final  . Total Bilirubin 03/03/2014 0.7  0.0 - 1.2 mg/dL Final  . Alkaline Phosphatase 03/03/2014 134* 39 - 117 IU/L Final  . AST 03/03/2014 55* 0 - 40 IU/L Final  . ALT 03/03/2014 31  0 - 44 IU/L Final  . Cholesterol, Total 03/03/2014 180  100 - 199 mg/dL Final                 **  Please note reference interval change**  . Triglycerides 03/03/2014 311* 0 - 149 mg/dL Final                 **Please note reference interval change**  . HDL 03/03/2014 29* >39 mg/dL Final   Comment: According to ATP-III Guidelines, HDL-C >59 mg/dL is considered a negative risk factor for CHD.   Marland Kitchen VLDL Cholesterol Cal 03/03/2014 62* 5 - 40 mg/dL Final  . LDL Calculated 03/03/2014 89  0 - 99 mg/dL Final                 **Please note reference interval change**  . Chol/HDL Ratio 03/03/2014 6.2* 0.0 - 5.0 ratio units Final   Comment:                                   T. Chol/HDL Ratio                                             Men  Women                               1/2 Avg.Risk  3.4    3.3                                   Avg.Risk  5.0    4.4                                2X Avg.Risk  9.6    7.1                                3X Avg.Risk 23.4   11.0   . TSH 03/03/2014 3.760  0.450 - 4.500 uIU/mL Final     Assessment/Plan 1. Type II diabetes mellitus with renal manifestations, uncontrolled Patient wants to control diet better before adding medication.   2. Dyslipidemia Control diet  3. Essential hypertension controlled  4. Hypothyroidism, unspecified hypothyroidism type compensated  5. Elevated liver enzymes Continue to monitor  6.Tension Headaches - controlled - pentazocine-naloxone (TALWIN NX) 50-0.5 MG per tablet; Take 1 tablet by mouth every 4 (four) hours as needed for pain.  Dispense: 90 tablet; Refill: 0

## 2014-03-06 ENCOUNTER — Other Ambulatory Visit: Payer: Self-pay | Admitting: Internal Medicine

## 2014-04-09 ENCOUNTER — Encounter (HOSPITAL_COMMUNITY): Payer: Self-pay | Admitting: Cardiovascular Disease

## 2014-04-13 ENCOUNTER — Other Ambulatory Visit: Payer: Self-pay | Admitting: Internal Medicine

## 2014-05-13 ENCOUNTER — Ambulatory Visit (INDEPENDENT_AMBULATORY_CARE_PROVIDER_SITE_OTHER): Payer: Medicare Other | Admitting: Internal Medicine

## 2014-05-13 ENCOUNTER — Encounter: Payer: Self-pay | Admitting: Internal Medicine

## 2014-05-13 VITALS — BP 130/80 | HR 95 | Temp 97.7°F | Resp 20 | Ht 70.0 in | Wt 206.0 lb

## 2014-05-13 DIAGNOSIS — M1000 Idiopathic gout, unspecified site: Secondary | ICD-10-CM | POA: Diagnosis not present

## 2014-05-13 DIAGNOSIS — M10071 Idiopathic gout, right ankle and foot: Secondary | ICD-10-CM

## 2014-05-13 MED ORDER — COLCHICINE 0.6 MG PO TABS: 0.6000 mg | ORAL_TABLET | Freq: Every day | ORAL | Status: DC

## 2014-05-13 NOTE — Progress Notes (Signed)
Patient ID: Dean Cole, male   DOB: [birth year], 64 y.o.   MRN: [id number]    Facility  [facility]    Place of Service:   OFFICE   Allergies  Allergen Reactions  . Adhesive [Tape] Swelling  . Eggs Or Egg-Derived Products Other (See Comments)    Bloating, nausea, and no rash     Chief Complaint  Patient presents with  . Acute Visit    swollen toe/ ? Gout    HPI:  64 yo male presents today with right great toe pain x 1 week. He has increased swelling and redness. No increased intake of purines. Pain with weightbearing. No f/c.  Tried OTC Tylenol which helped. Pain has worsened and presented for tx. Wife present  Medications: Patient's Medications  New Prescriptions   No medications on file  Previous Medications   ASPIRIN EC 325 MG EC TABLET    Take 1 tablet (325 mg total) by mouth daily.   DIAZEPAM (VALIUM) 5 MG TABLET    TAKE 1 TABLET BY MOUTH FOUR TIMES A DAY AS NEEDED FOR NERVES   FUROSEMIDE (LASIX) 40 MG TABLET    Take 1 tablet (40 mg total) by mouth daily as needed.   IMIPRAMINE (TOFRANIL) 25 MG TABLET    TAKE 3 TABLETS BY MOUTH EVERY MORNING AND 4 TABLET AT BEDTIME   LEVOTHYROXINE (SYNTHROID, LEVOTHROID) 150 MCG TABLET    TAKE 1 TABLET (150 MCG TOTAL) BY MOUTH DAILY.   METOPROLOL TARTRATE (LOPRESSOR) 25 MG TABLET    TAKE 1 TABLET TWICE A DAY   MOMETASONE (ELOCON) 0.1 % CREAM    USE DAILY TO CONTROL ECZEMA   NITROSTAT 0.4 MG SL TABLET    Place 0.4 mg under the tongue every 5 (five) minutes as needed. For chest pain   PENTAZOCINE-NALOXONE (TALWIN NX) 50-0.5 MG PER TABLET    Take 1 tablet by mouth every 4 (four) hours as needed for pain.   POLYETHYLENE GLYCOL (MIRALAX / GLYCOLAX) PACKET    Take 17 g by mouth daily. As needed   SIMVASTATIN (ZOCOR) 20 MG TABLET    Take 1 tablet (20 mg total) by mouth daily.  Modified Medications   No medications on file  Discontinued Medications   No medications on file     Review of Systems   As above. All other systems reviewed are  negative  Filed Vitals:   05/13/14 1557  BP: 130/80  Pulse: 95  Temp: 97.7 F (36.5 C)  TempSrc: Oral  Resp: 20  Height: 5\' 10"  (1.778 m)  Weight: 206 lb (93.441 kg)  SpO2: 95%   Body mass index is 29.56 kg/(m^2).  Physical Exam  CONSTITUTIONAL: Looks uncomfortable in NAD. Awake, alert and oriented x 3 EXTREMITIES: +1 pitting LE edema b/l. Distal pulses palpable. No calf tenderness. Right 1st toe red, hot and swollen with reduced ROM and TTP. Capillary refill intact PSYCH: Affect, behavior and mood normal  Labs reviewed: Appointment on 03/03/2014  Component Date Value Ref Range Status  . Hgb A1c MFr Bld 03/03/2014 7.3* 4.8 - 5.6 % Final   Comment:          Pre-diabetes: 5.7 - 6.4          Diabetes: >6.4          Glycemic control for adults with diabetes: <7.0   . Est. average glucose Bld gHb Est-m* 03/03/2014 163   Final  . Glucose 03/03/2014 142* 65 - 99 mg/dL Final  . BUN 03/03/2014  12  8 - 27 mg/dL Final  . Creatinine, Ser 03/03/2014 1.29* 0.76 - 1.27 mg/dL Final  . GFR calc non Af Amer 03/03/2014 59* >59 mL/min/1.73 Final  . GFR calc Af Amer 03/03/2014 68  >59 mL/min/1.73 Final  . BUN/Creatinine Ratio 03/03/2014 9* 10 - 22 Final  . Sodium 03/03/2014 139  134 - 144 mmol/L Final  . Potassium 03/03/2014 4.2  3.5 - 5.2 mmol/L Final  . Chloride 03/03/2014 97  97 - 108 mmol/L Final  . CO2 03/03/2014 25  18 - 29 mmol/L Final  . Calcium 03/03/2014 9.3  8.6 - 10.2 mg/dL Final  . Total Protein 03/03/2014 7.1  6.0 - 8.5 g/dL Final  . Albumin 03/03/2014 4.6  3.6 - 4.8 g/dL Final  . Globulin, Total 03/03/2014 2.5  1.5 - 4.5 g/dL Final  . Albumin/Globulin Ratio 03/03/2014 1.8  1.1 - 2.5 Final  . Total Bilirubin 03/03/2014 0.7  0.0 - 1.2 mg/dL Final  . Alkaline Phosphatase 03/03/2014 134* 39 - 117 IU/L Final  . AST 03/03/2014 55* 0 - 40 IU/L Final  . ALT 03/03/2014 31  0 - 44 IU/L Final  . Cholesterol, Total 03/03/2014 180  100 - 199 mg/dL Final                 **Please note  reference interval change**  . Triglycerides 03/03/2014 311* 0 - 149 mg/dL Final                 **Please note reference interval change**  . HDL 03/03/2014 29* >39 mg/dL Final   Comment: According to ATP-III Guidelines, HDL-C >59 mg/dL is considered a negative risk factor for CHD.   Marland Kitchen VLDL Cholesterol Cal 03/03/2014 62* 5 - 40 mg/dL Final  . LDL Calculated 03/03/2014 89  0 - 99 mg/dL Final                 **Please note reference interval change**  . Chol/HDL Ratio 03/03/2014 6.2* 0.0 - 5.0 ratio units Final   Comment:                                   T. Chol/HDL Ratio                                             Men  Women                               1/2 Avg.Risk  3.4    3.3                                   Avg.Risk  5.0    4.4                                2X Avg.Risk  9.6    7.1                                3X Avg.Risk 23.4   11.0   . TSH 03/03/2014 3.760  0.450 - 4.500 uIU/mL Final  Assessment/Plan    ICD-9-CM ICD-10-CM   1. Acute idiopathic gout of right foot 274.01 M10.071 colchicine 0.6 MG tablet     Uric Acid    --may consider prednisone taper if no improvement with colchicine  --may need podiatry vs Ortho eval  --continue all other medications as Rx  --f/u with Dr Nyoka Cowden as scheduled   Cordella Register. Perlie Gold  [facility] [address] [place], [place] [address] [phone] Office (Wednesdays and Fridays 8 AM - 5 PM) [phone] Cell (Monday-Friday 8 AM - 5 PM)

## 2014-05-13 NOTE — Patient Instructions (Signed)
Do not take simvastatin while taking colchicine.  May apply warm compresses as needed for acute flares.

## 2014-05-14 LAB — URIC ACID: Uric Acid: 7.1 mg/dL (ref 3.7–8.6)

## 2014-05-15 ENCOUNTER — Telehealth: Payer: Self-pay | Admitting: *Deleted

## 2014-05-15 MED ORDER — PREDNISONE 10 MG PO TABS: ORAL_TABLET | ORAL | Status: DC

## 2014-05-15 NOTE — Telephone Encounter (Signed)
Wife called again. Gave Dr. Eulas Post a verbal and she will review.

## 2014-05-15 NOTE — Telephone Encounter (Signed)
If he is still having pain, recommend prednisone taper. Rx medrol dosepak #1 take as directed. No RF. If he does not have relief, he will need a podiatry referral +/- xrays

## 2014-05-15 NOTE — Telephone Encounter (Signed)
Pharmacy did not have Medrol Dosepak. Dr. Eulas Post prescribed Prednisone Taper instead. Patient notified and agreed.

## 2014-05-15 NOTE — Telephone Encounter (Signed)
Patient wife called and stated that patient was seen for gout and given medication. Wife states the gout is no better and his foot looks worse. Patient has taken 3 pills already and no relief. Also wants to know what bloodwork says. Please Advise.

## 2014-05-16 ENCOUNTER — Encounter: Payer: Self-pay | Admitting: Internal Medicine

## 2014-05-24 ENCOUNTER — Other Ambulatory Visit: Payer: Self-pay | Admitting: Internal Medicine

## 2014-06-08 ENCOUNTER — Other Ambulatory Visit: Payer: Medicare Other

## 2014-06-08 DIAGNOSIS — E039 Hypothyroidism, unspecified: Secondary | ICD-10-CM | POA: Diagnosis not present

## 2014-06-08 DIAGNOSIS — E1165 Type 2 diabetes mellitus with hyperglycemia: Principal | ICD-10-CM

## 2014-06-08 DIAGNOSIS — E1129 Type 2 diabetes mellitus with other diabetic kidney complication: Secondary | ICD-10-CM | POA: Diagnosis not present

## 2014-06-08 DIAGNOSIS — IMO0002 Reserved for concepts with insufficient information to code with codable children: Secondary | ICD-10-CM

## 2014-06-09 LAB — COMPREHENSIVE METABOLIC PANEL
ALT: 21 IU/L (ref 0–44)
AST: 31 IU/L (ref 0–40)
Albumin/Globulin Ratio: 1.8 (ref 1.1–2.5)
Albumin: 4.4 g/dL (ref 3.6–4.8)
Alkaline Phosphatase: 141 IU/L — ABNORMAL HIGH (ref 39–117)
BUN/Creatinine Ratio: 9 — ABNORMAL LOW (ref 10–22)
BUN: 10 mg/dL (ref 8–27)
Bilirubin Total: 0.6 mg/dL (ref 0.0–1.2)
CHLORIDE: 98 mmol/L (ref 97–108)
CO2: 24 mmol/L (ref 18–29)
Calcium: 9 mg/dL (ref 8.6–10.2)
Creatinine, Ser: 1.11 mg/dL (ref 0.76–1.27)
GFR calc Af Amer: 81 mL/min/{1.73_m2} (ref >59)
GFR, EST NON AFRICAN AMERICAN: 70 mL/min/{1.73_m2} (ref >59)
GLUCOSE: 158 mg/dL — AB (ref 65–99)
Globulin, Total: 2.5 g/dL (ref 1.5–4.5)
POTASSIUM: 3.7 mmol/L (ref 3.5–5.2)
Sodium: 139 mmol/L (ref 134–144)
TOTAL PROTEIN: 6.9 g/dL (ref 6.0–8.5)

## 2014-06-09 LAB — MICROALBUMIN, URINE: MICROALBUM., U, RANDOM: 7.4 ug/mL (ref 0.0–17.0)

## 2014-06-09 LAB — HEMOGLOBIN A1C
Est. average glucose Bld gHb Est-mCnc: 180 mg/dL
HEMOGLOBIN A1C: 7.9 % — AB (ref 4.8–5.6)

## 2014-06-09 LAB — TSH: TSH: 2.67 u[IU]/mL (ref 0.450–4.500)

## 2014-06-10 ENCOUNTER — Encounter: Payer: Self-pay | Admitting: Internal Medicine

## 2014-06-10 ENCOUNTER — Ambulatory Visit (INDEPENDENT_AMBULATORY_CARE_PROVIDER_SITE_OTHER): Payer: Medicare Other | Admitting: Internal Medicine

## 2014-06-10 VITALS — BP 128/76 | HR 67 | Temp 96.9°F | Resp 10 | Ht 70.0 in | Wt 203.0 lb

## 2014-06-10 DIAGNOSIS — I10 Essential (primary) hypertension: Secondary | ICD-10-CM | POA: Diagnosis not present

## 2014-06-10 DIAGNOSIS — E785 Hyperlipidemia, unspecified: Secondary | ICD-10-CM | POA: Diagnosis not present

## 2014-06-10 DIAGNOSIS — I2583 Coronary atherosclerosis due to lipid rich plaque: Secondary | ICD-10-CM

## 2014-06-10 DIAGNOSIS — I2510 Atherosclerotic heart disease of native coronary artery without angina pectoris: Secondary | ICD-10-CM | POA: Diagnosis not present

## 2014-06-10 DIAGNOSIS — IMO0002 Reserved for concepts with insufficient information to code with codable children: Secondary | ICD-10-CM

## 2014-06-10 DIAGNOSIS — E1165 Type 2 diabetes mellitus with hyperglycemia: Secondary | ICD-10-CM

## 2014-06-10 DIAGNOSIS — E039 Hypothyroidism, unspecified: Secondary | ICD-10-CM | POA: Diagnosis not present

## 2014-06-10 DIAGNOSIS — E1129 Type 2 diabetes mellitus with other diabetic kidney complication: Secondary | ICD-10-CM | POA: Diagnosis not present

## 2014-06-10 NOTE — Progress Notes (Signed)
Patient ID: Dean Cole, male   DOB: 09/25/1950, 64 y.o.   MRN: [id number]    Facility  [facility]    Place of Service:   OFFICE   Allergies  Allergen Reactions  . Adhesive [Tape] Swelling  . Eggs Or Egg-Derived Products Other (See Comments)    Bloating, nausea, and no rash     Chief Complaint  Patient presents with  . Medical Management of Chronic Issues    3 month follow-up, discuss labs (copy pritned)     HPI:  Pain at sternal scar. Feels a wire under the skin.  Type II diabetes mellitus with renal manifestations, uncontrolled - Noncompliant with diet. Sleeping all the time.   Essential hypertension - controlled  Hypothyroidism, unspecified hypothyroidism type - compensated  Dyslipidemia: controlled  Coronary artery disease due to lipid rich plaque: stable    Medications: Patient's Medications  New Prescriptions   No medications on file  Previous Medications   ASPIRIN EC 325 MG EC TABLET    Take 1 tablet (325 mg total) by mouth daily.   DIAZEPAM (VALIUM) 5 MG TABLET    TAKE 1 TABLET 4 TIMES A DAY AS NEEDED NERVES   FUROSEMIDE (LASIX) 40 MG TABLET    Take 1 tablet (40 mg total) by mouth daily as needed.   IMIPRAMINE (TOFRANIL) 25 MG TABLET    TAKE 3 TABLETS BY MOUTH EVERY MORNING AND 4 TABLET AT BEDTIME   LEVOTHYROXINE (SYNTHROID, LEVOTHROID) 150 MCG TABLET    TAKE 1 TABLET (150 MCG TOTAL) BY MOUTH DAILY.   METOPROLOL TARTRATE (LOPRESSOR) 25 MG TABLET    TAKE 1 TABLET TWICE A DAY   MOMETASONE (ELOCON) 0.1 % CREAM    USE DAILY TO CONTROL ECZEMA   NITROSTAT 0.4 MG SL TABLET    Place 0.4 mg under the tongue every 5 (five) minutes as needed. For chest pain   PENTAZOCINE-NALOXONE (TALWIN NX) 50-0.5 MG PER TABLET    Take 1 tablet by mouth every 4 (four) hours as needed for pain.   POLYETHYLENE GLYCOL (MIRALAX / GLYCOLAX) PACKET    Take 17 g by mouth daily. As needed   SIMVASTATIN (ZOCOR) 20 MG TABLET    Take 1 tablet (20 mg total) by mouth daily.  Modified Medications     No medications on file  Discontinued Medications   COLCHICINE 0.6 MG TABLET    Take 1 tablet (0.6 mg total) by mouth daily. Take for 3 days only   PREDNISONE (DELTASONE) 10 MG TABLET    Take three tablets by mouth for 2 days, then two tablets by mouth for 2 days, then One tablet by mouth for 2 days     Review of Systems  Constitutional: Positive for fatigue. Negative for fever, chills, activity change, appetite change and unexpected weight change.  HENT: Positive for hearing loss. Negative for congestion, ear pain, rhinorrhea and tinnitus.   Eyes: Negative.   Respiratory: Negative for apnea, cough, choking, chest tightness, shortness of breath and wheezing.   Cardiovascular: Negative for chest pain, palpitations and leg swelling.  Gastrointestinal: Positive for constipation.  Endocrine: Negative for cold intolerance, heat intolerance, polydipsia, polyphagia and polyuria.       Diabetic. Hypothyroid on supplements.  Genitourinary:       Nocturia.  Musculoskeletal: Positive for arthralgias.  Skin:       Chronic dry skin. Healing area of tick bite right shoulder.  Allergic/Immunologic: Negative.   Neurological:       Recurrent tension headaches.  Hematological: Negative.   Psychiatric/Behavioral:        Chronic anxiety. Some depressive symptoms. History of insomnia.    Filed Vitals:   06/10/14 1138  BP: 128/76  Pulse: 67  Temp: 96.9 F (36.1 C)  TempSrc: Oral  Resp: 10  Height: 5\' 10"  (1.778 m)  Weight: 203 lb (92.08 kg)  SpO2: 98%   Body mass index is 29.13 kg/(m^2).  Physical Exam  Constitutional: He is oriented to person, place, and time. He appears well-developed and well-nourished. No distress.  HENT:  Head: Normocephalic and atraumatic.  Right Ear: External ear normal.  Left Ear: External ear normal.  Nose: Nose normal.  Mouth/Throat: Oropharynx is clear and moist.  Mild loss of hearing.  Eyes: Conjunctivae and EOM are normal. Pupils are equal, round, and  reactive to light.  Neck:  Upper and lower dentures. Stud in the tongue.  Cardiovascular: Normal rate, regular rhythm, normal heart sounds and intact distal pulses.  Exam reveals no gallop and no friction rub.   No murmur heard. Pulmonary/Chest: No respiratory distress. He has no wheezes. He has no rales. He exhibits no tenderness.  Abdominal: He exhibits no distension and no mass. There is no tenderness.  Musculoskeletal: Normal range of motion. He exhibits no edema or tenderness.  Neurological: He is alert and oriented to person, place, and time. He has normal reflexes. No cranial nerve deficit. Coordination normal.  Skin: No rash noted. No erythema. No pallor.  Multiple tattoos. Multiple piercings. Shave/moles. 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 Polypoid growth of the left lower eyelid. I do not feel any wires today and he does not either.  Psychiatric: He has a normal mood and affect. His behavior is normal. Thought content normal.     Labs reviewed: Appointment on 06/08/2014  Component Date Value Ref Range Status  . Hgb A1c MFr Bld 06/08/2014 7.9* 4.8 - 5.6 % Final   Comment:          Pre-diabetes: 5.7 - 6.4          Diabetes: >6.4          Glycemic control for adults with diabetes: <7.0   . Est. average glucose Bld gHb Est-m* 06/08/2014 180   Final  . Glucose 06/08/2014 158* 65 - 99 mg/dL Final  . BUN 06/08/2014 10  8 - 27 mg/dL Final  . Creatinine, Ser 06/08/2014 1.11  0.76 - 1.27 mg/dL Final  . GFR calc non Af Amer 06/08/2014 70  >59 mL/min/1.73 Final  . GFR calc Af Amer 06/08/2014 81  >59 mL/min/1.73 Final  . BUN/Creatinine Ratio 06/08/2014 9* 10 - 22 Final  . Sodium 06/08/2014 139  134 - 144 mmol/L Final  . Potassium 06/08/2014 3.7  3.5 - 5.2 mmol/L Final  . Chloride 06/08/2014 98  97 - 108 mmol/L Final  . CO2 06/08/2014 24  18 - 29 mmol/L Final  . Calcium 06/08/2014 9.0  8.6 - 10.2 mg/dL Final  . Total Protein 06/08/2014 6.9  6.0 - 8.5 g/dL Final    . Albumin 06/08/2014 4.4  3.6 - 4.8 g/dL Final  . Globulin, Total 06/08/2014 2.5  1.5 - 4.5 g/dL Final  . Albumin/Globulin Ratio 06/08/2014 1.8  1.1 - 2.5 Final  . BILIRUBIN TOTAL 06/08/2014 0.6  0.0 - 1.2 mg/dL Final  . Alkaline Phosphatase 06/08/2014 141* 39 - 117 IU/L Final  . AST 06/08/2014 31  0 - 40 IU/L Final  . ALT 06/08/2014 21  0 -  44 IU/L Final  . Microalbum.,U,Random 06/08/2014 7.4  0.0 - 17.0 ug/mL Final  . TSH 06/08/2014 2.670  0.450 - 4.500 uIU/mL Final  Office Visit on 05/13/2014  Component Date Value Ref Range Status  . Uric Acid 05/13/2014 7.1  3.7 - 8.6 mg/dL Final              Therapeutic target for gout patients: <6.0     Assessment/Plan   1. Type II diabetes mellitus with renal manifestations, uncontrolled -lose weight. Increase activity - Hemoglobin A1c; Future - Comprehensive metabolic panel; Future  2. Essential hypertension - Comprehensive metabolic panel; Future  3. Hypothyroidism, unspecified hypothyroidism type - TSH; Future  4. Dyslipidemia controlled  5. Coronary artery disease due to lipid rich plaque

## 2014-06-18 ENCOUNTER — Other Ambulatory Visit: Payer: Self-pay | Admitting: Internal Medicine

## 2014-06-30 ENCOUNTER — Other Ambulatory Visit: Payer: Self-pay | Admitting: Internal Medicine

## 2014-07-02 ENCOUNTER — Encounter: Payer: Self-pay | Admitting: Cardiology

## 2014-07-02 ENCOUNTER — Ambulatory Visit (INDEPENDENT_AMBULATORY_CARE_PROVIDER_SITE_OTHER): Payer: Medicare Other | Admitting: Cardiology

## 2014-07-02 VITALS — BP 132/82 | HR 74 | Ht 70.0 in | Wt 203.0 lb

## 2014-07-02 DIAGNOSIS — I10 Essential (primary) hypertension: Secondary | ICD-10-CM | POA: Diagnosis not present

## 2014-07-02 DIAGNOSIS — I259 Chronic ischemic heart disease, unspecified: Secondary | ICD-10-CM

## 2014-07-02 DIAGNOSIS — N529 Male erectile dysfunction, unspecified: Secondary | ICD-10-CM | POA: Diagnosis not present

## 2014-07-02 DIAGNOSIS — E785 Hyperlipidemia, unspecified: Secondary | ICD-10-CM

## 2014-07-02 MED ORDER — SILDENAFIL CITRATE 50 MG PO TABS: 50.0000 mg | ORAL_TABLET | Freq: Every day | ORAL | Status: DC | PRN

## 2014-07-02 NOTE — Progress Notes (Signed)
Cardiology Office Note   Date:  07/02/2014   ID:  Dean Cole, DOB [birth year], MRN [id number]  PCP:  Estill Dooms, MD  Cardiologist:   Dean Coco, MD   Chief Complaint  Patient presents with  . OTHER    tenderness in upper chest      History of Present Illness: Dean Cole is a 64 y.o. male who presents for scheduled follow-up office visit.  This pleasant 64 year old gentleman is seen back for a scheduled followup office visit. He has a history of ischemic heart disease and underwent coronary artery bypass grafting x4 on 07/10/11 by Dr. Prescott Gum. He had severe multivessel coronary artery disease. Patient also has a history of high blood pressure, diabetes, hypothyroidism, and mild renal insufficiency. Since his heart surgery in March 2013 the patient was readmitted to the hospital 6 weeks later with acute cholecystitis and underwent cholecystectomy by Dr. Hulen Skains. Since then the patient has been feeling well except for problems with mild fluid retention. He has noted occasional ankle edema. He has to take a Lasix one to 3 times per month for mild edema. . Previously he had been chewing tobacco but he has not used tobacco since his bypass surgery. He does not drink any alcohol. The patient has hypercholesterolemia and is on and is on simvastatin 20 mg daily. Dr. Jeanmarie Hubert checks his labs. The patient has a past history of paroxysmal supraventricular tachycardia. The patient has had problems with erectile dysfunction.  He was hoping that this would improve after bypass surgery but it has not.  He is interested in possible use of Viagra.  He does not take nitrates. The patient has not been experiencing any chest pain or angina.  Exercise tolerance is stable.  Past Medical History  Diagnosis Date  . HTN (hypertension)   . Hypothyroidism   . Borderline diabetes   . Coronary artery disease   . Angina   . Shortness of breath   . Cancer     skin cancer  basel cell  carcinoma  . Headache(784.0)   . Restless leg syndrome   . Cholecystitis   . Blood transfusion     "no reaction to transfusions"  . Anxiety     Past Surgical History  Procedure Laterality Date  . Cardiac catheterization  07/07/2011  . Coronary artery bypass graft  07/10/2011    Procedure: CORONARY ARTERY BYPASS GRAFTING (CABG);  Surgeon: Ivin Poot, MD;  Location: [facility];  Service: Open Heart Surgery;  Laterality: N/A;  . Excisional hemorrhoidectomy  1997  . Cholecystectomy  09/14/2011    Procedure: LAPAROSCOPIC CHOLECYSTECTOMY WITH INTRAOPERATIVE CHOLANGIOGRAM;  Surgeon: Gwenyth Ober, MD;  Location: [facility];  Service: General;  Laterality: N/A;  . Hemorroidectomy  2000  . Basal cell carcinoma excision  2000    Dr,Arkin   . Left heart catheterization with coronary angiogram N/A 07/07/2011    Procedure: LEFT HEART CATHETERIZATION WITH CORONARY ANGIOGRAM;  Surgeon: Burnell Blanks, MD;  Location: [facility] CATH LAB;  Service: Cardiovascular;  Laterality: N/A;     Current Outpatient Prescriptions  Medication Sig Dispense Refill  . aspirin EC 325 MG EC tablet Take 1 tablet (325 mg total) by mouth daily. 30 tablet   . diazepam (VALIUM) 5 MG tablet TAKE 1 TABLET BY MOUTH 4 TIMES A DAY AS NEEDED FOR NERVES 120 tablet 0  . furosemide (LASIX) 40 MG tablet Take 1 tablet (40 mg total) by mouth daily as needed. [place]  tablet 5  . imipramine (TOFRANIL) 25 MG tablet TAKE 3 TABLETS BY MOUTH EVERY MORNING AND 4 TABLET AT BEDTIME 210 tablet 3  . levothyroxine (SYNTHROID, LEVOTHROID) 150 MCG tablet TAKE 1 TABLET (150 MCG TOTAL) BY MOUTH DAILY. 90 tablet 1  . metoprolol tartrate (LOPRESSOR) 25 MG tablet TAKE 1 TABLET TWICE A DAY 180 tablet 1  . mometasone (ELOCON) 0.1 % cream USE DAILY TO CONTROL ECZEMA 45 g 0  . NITROSTAT 0.4 MG SL tablet Place 0.4 mg under the tongue every 5 (five) minutes as needed. For chest pain    . pentazocine-naloxone (TALWIN NX) 50-0.5 MG per tablet Take 1 tablet by mouth every 4  (four) hours as needed for pain. 90 tablet 0  . polyethylene glycol (MIRALAX / GLYCOLAX) packet Take 17 g by mouth daily. As needed    . simvastatin (ZOCOR) 20 MG tablet TAKE 1 TABLET EVERY DAY TO LOWER CHOLESTROL 90 tablet 1  . sildenafil (VIAGRA) 50 MG tablet Take 1 tablet (50 mg total) by mouth daily as needed. 3 tablet 5   No current facility-administered medications for this visit.    Allergies:   Eggs or egg-derived products and Adhesive    Social History:  The patient  reports that he quit smoking about 32 years ago. He has quit using smokeless tobacco. He reports that he does not drink alcohol or use illicit drugs.   Family History:  The patient's family history includes Coronary artery disease in his father and paternal grandfather; Suicidality in his brother.    ROS:  Please see the history of present illness.   Otherwise, review of systems are positive for none.   All other systems are reviewed and negative.    PHYSICAL EXAM: VS:  BP 132/82 mmHg  Pulse 74  Ht 5\' 10"  (1.778 m)  Wt 203 lb (92.08 kg)  BMI 29.13 kg/m2  SpO2 97% , BMI Body mass index is 29.13 kg/(m^2). GEN: Well nourished, well developed, in no acute distress HEENT: normal Neck: no JVD, carotid bruits, or masses Cardiac: RRR; no murmurs, rubs, or gallops,no edema  Respiratory:  clear to auscultation bilaterally, normal work of breathing GI: soft, nontender, nondistended, + BS MS: no deformity or atrophy Skin: warm and dry, no rash Neuro:  Strength and sensation are intact Psych: euthymic mood, full affect   EKG:  EKG is not ordered today.    Recent Labs: 06/08/2014: ALT 21; BUN 10; Creatinine 1.11; Potassium 3.7; Sodium 139; TSH 2.670    Lipid Panel    Component Value Date/Time   CHOL 180 03/03/2014 0916   CHOL 200 07/09/2011 0900   TRIG 311* 03/03/2014 0916   HDL 29* 03/03/2014 0916   HDL 31* 07/09/2011 0900   CHOLHDL 6.2* 03/03/2014 0916   CHOLHDL 6.5 07/09/2011 0900   VLDL 62* 07/09/2011  0900   LDLCALC 89 03/03/2014 0916   LDLCALC 107* 07/09/2011 0900      Wt Readings from Last 3 Encounters:  07/02/14 203 lb (92.08 kg)  06/10/14 203 lb (92.08 kg)  05/13/14 206 lb (93.441 kg)         ASSESSMENT AND PLAN:  1. ischemic heart disease status post CABG x4 on 07/10/11 by Dr. Prescott Gum 2. Status post cholecystectomy 3. essential hypertension without heart failure 4. diabetes mellitus, adult onset 5. Hypothyroidism 6. mild renal insufficiency 7. Erectile dysfunction   Current medicines are reviewed at length with the patient today.  The patient does not have concerns regarding medicines.  The following changes have been made:  no change  Labs/ tests ordered today include:  No orders of the defined types were placed in this encounter.   The patient is to continue current medication.  We will give him a trial of Viagra 50 mg as directed #3.  he is concerned about the cost.  Disposition:   FU with Dr. Mare Ferrari in 6 months for office visit and EKG   Signed, Dean Coco, MD  07/02/2014 3:55 PM    [address], [place], [place]  [address] Phone: [phone]; Fax: [phone]

## 2014-07-02 NOTE — Patient Instructions (Signed)
RX FOR VIAGRA 50 MG AS NEEDED SENT TO [facility]  Your physician wants you to follow-up in: [place] will receive a reminder letter in the mail two months in advance. If you don't receive a letter, please call our office to schedule the follow-up appointment.

## 2014-07-13 ENCOUNTER — Other Ambulatory Visit: Payer: Self-pay | Admitting: Cardiology

## 2014-08-06 DIAGNOSIS — Z85820 Personal history of malignant melanoma of skin: Secondary | ICD-10-CM | POA: Diagnosis not present

## 2014-08-06 DIAGNOSIS — D1801 Hemangioma of skin and subcutaneous tissue: Secondary | ICD-10-CM | POA: Diagnosis not present

## 2014-08-06 DIAGNOSIS — L821 Other seborrheic keratosis: Secondary | ICD-10-CM | POA: Diagnosis not present

## 2014-08-13 ENCOUNTER — Other Ambulatory Visit: Payer: Self-pay | Admitting: Internal Medicine

## 2014-09-02 ENCOUNTER — Other Ambulatory Visit: Payer: Self-pay | Admitting: Internal Medicine

## 2014-09-14 ENCOUNTER — Other Ambulatory Visit: Payer: Medicare Other

## 2014-09-14 DIAGNOSIS — E1129 Type 2 diabetes mellitus with other diabetic kidney complication: Secondary | ICD-10-CM

## 2014-09-14 DIAGNOSIS — I10 Essential (primary) hypertension: Secondary | ICD-10-CM

## 2014-09-14 DIAGNOSIS — E039 Hypothyroidism, unspecified: Secondary | ICD-10-CM | POA: Diagnosis not present

## 2014-09-14 DIAGNOSIS — IMO0002 Reserved for concepts with insufficient information to code with codable children: Secondary | ICD-10-CM

## 2014-09-14 DIAGNOSIS — E1165 Type 2 diabetes mellitus with hyperglycemia: Principal | ICD-10-CM

## 2014-09-15 LAB — COMPREHENSIVE METABOLIC PANEL
A/G RATIO: 1.7 (ref 1.1–2.5)
ALT: 30 IU/L (ref 0–44)
AST: 39 IU/L (ref 0–40)
Albumin: 4.8 g/dL (ref 3.6–4.8)
Alkaline Phosphatase: 138 IU/L — ABNORMAL HIGH (ref 39–117)
BILIRUBIN TOTAL: 0.6 mg/dL (ref 0.0–1.2)
BUN/Creatinine Ratio: 9 — ABNORMAL LOW (ref 10–22)
BUN: 10 mg/dL (ref 8–27)
CO2: 26 mmol/L (ref 18–29)
Calcium: 9.6 mg/dL (ref 8.6–10.2)
Chloride: 99 mmol/L (ref 97–108)
Creatinine, Ser: 1.13 mg/dL (ref 0.76–1.27)
GFR calc Af Amer: 80 mL/min/{1.73_m2} (ref >59)
GFR, EST NON AFRICAN AMERICAN: 69 mL/min/{1.73_m2} (ref >59)
Globulin, Total: 2.8 g/dL (ref 1.5–4.5)
Glucose: 140 mg/dL — ABNORMAL HIGH (ref 65–99)
POTASSIUM: 4.6 mmol/L (ref 3.5–5.2)
SODIUM: 143 mmol/L (ref 134–144)
Total Protein: 7.6 g/dL (ref 6.0–8.5)

## 2014-09-15 LAB — HEMOGLOBIN A1C
Est. average glucose Bld gHb Est-mCnc: 151 mg/dL
HEMOGLOBIN A1C: 6.9 % — AB (ref 4.8–5.6)

## 2014-09-15 LAB — TSH: TSH: 1.6 u[IU]/mL (ref 0.450–4.500)

## 2014-09-16 ENCOUNTER — Ambulatory Visit (INDEPENDENT_AMBULATORY_CARE_PROVIDER_SITE_OTHER): Payer: Medicare Other | Admitting: Internal Medicine

## 2014-09-16 ENCOUNTER — Encounter: Payer: Self-pay | Admitting: Internal Medicine

## 2014-09-16 VITALS — BP 130/82 | HR 75 | Temp 98.1°F | Ht 70.0 in | Wt 196.8 lb

## 2014-09-16 DIAGNOSIS — E039 Hypothyroidism, unspecified: Secondary | ICD-10-CM

## 2014-09-16 DIAGNOSIS — E785 Hyperlipidemia, unspecified: Secondary | ICD-10-CM | POA: Diagnosis not present

## 2014-09-16 DIAGNOSIS — I798 Other disorders of arteries, arterioles and capillaries in diseases classified elsewhere: Secondary | ICD-10-CM

## 2014-09-16 DIAGNOSIS — I739 Peripheral vascular disease, unspecified: Secondary | ICD-10-CM | POA: Diagnosis not present

## 2014-09-16 DIAGNOSIS — F419 Anxiety disorder, unspecified: Secondary | ICD-10-CM | POA: Diagnosis not present

## 2014-09-16 DIAGNOSIS — E1151 Type 2 diabetes mellitus with diabetic peripheral angiopathy without gangrene: Secondary | ICD-10-CM | POA: Diagnosis not present

## 2014-09-16 DIAGNOSIS — E1165 Type 2 diabetes mellitus with hyperglycemia: Secondary | ICD-10-CM

## 2014-09-16 DIAGNOSIS — F329 Major depressive disorder, single episode, unspecified: Secondary | ICD-10-CM

## 2014-09-16 DIAGNOSIS — G44229 Chronic tension-type headache, not intractable: Secondary | ICD-10-CM | POA: Diagnosis not present

## 2014-09-16 DIAGNOSIS — I2510 Atherosclerotic heart disease of native coronary artery without angina pectoris: Secondary | ICD-10-CM | POA: Diagnosis not present

## 2014-09-16 DIAGNOSIS — I2583 Coronary atherosclerosis due to lipid rich plaque: Secondary | ICD-10-CM

## 2014-09-16 DIAGNOSIS — G43809 Other migraine, not intractable, without status migrainosus: Secondary | ICD-10-CM | POA: Diagnosis not present

## 2014-09-16 DIAGNOSIS — I10 Essential (primary) hypertension: Secondary | ICD-10-CM | POA: Diagnosis not present

## 2014-09-16 DIAGNOSIS — E1129 Type 2 diabetes mellitus with other diabetic kidney complication: Secondary | ICD-10-CM | POA: Diagnosis not present

## 2014-09-16 DIAGNOSIS — IMO0002 Reserved for concepts with insufficient information to code with codable children: Secondary | ICD-10-CM

## 2014-09-16 DIAGNOSIS — F32A Depression, unspecified: Secondary | ICD-10-CM | POA: Insufficient documentation

## 2014-09-16 MED ORDER — PENTAZOCINE-NALOXONE 50-0.5 MG PO TABS: 1.0000 | ORAL_TABLET | ORAL | Status: DC | PRN

## 2014-09-16 MED ORDER — DIAZEPAM 5 MG PO TABS: ORAL_TABLET | ORAL | Status: DC

## 2014-09-16 NOTE — Progress Notes (Signed)
Patient ID: Dean Cole, male   DOB: April 07, 1951, 64 y.o.   MRN: [id number]    Facility  [facility]    Place of Service:   OFFICE    Allergies  Allergen Reactions  . Eggs Or Egg-Derived Products Other (See Comments)    Bloating, nausea, and no rash   . Adhesive [Tape] Swelling    Chief Complaint  Patient presents with  . Medical Management of Chronic Issues    3 month follow-up, discuss labs (copy printed)   . Stress    Patient with increased stress, patient currently with 5 children in home    HPI:   Other type of migraine - Plan: pentazocine-naloxone (TALWIN NX) 50-0.5 MG per tablet  Chronic tension-type headache, not intractable - Plan: pentazocine-naloxone (TALWIN NX) 50-0.5 MG per tablet  Type II diabetes mellitus with renal manifestations, uncontrolled - Plan: Hemoglobin A1c, Comprehensive metabolic panel, Microalbumin, urine  Essential hypertension - Plan: Comprehensive metabolic panel  Coronary artery disease due to lipid rich plaque  Dyslipidemia - Plan: Lipid panel  Anxiety  Hypothyroidism, unspecified hypothyroidism type - Plan: TSH  Depression    Medications: Patient's Medications  New Prescriptions   No medications on file  Previous Medications   ASPIRIN EC 325 MG EC TABLET    Take 1 tablet (325 mg total) by mouth daily.   FUROSEMIDE (LASIX) 40 MG TABLET    Take 1 tablet (40 mg total) by mouth daily as needed.   IMIPRAMINE (TOFRANIL) 25 MG TABLET    TAKE 3 TABLETS BY MOUTH EVERY MORNING AND 4 TABLET AT BEDTIME   LEVOTHYROXINE (SYNTHROID, LEVOTHROID) 150 MCG TABLET    TAKE 1 TABLET (150 MCG TOTAL) BY MOUTH DAILY.   METOPROLOL TARTRATE (LOPRESSOR) 25 MG TABLET    TAKE 1 TABLET TWICE A DAY   MOMETASONE (ELOCON) 0.1 % CREAM    USE DAILY TO CONTROL ECZEMA   NITROSTAT 0.4 MG SL TABLET    Place 0.4 mg under the tongue every 5 (five) minutes as needed. For chest pain   POLYETHYLENE GLYCOL (MIRALAX / GLYCOLAX) PACKET    Take 17 g by mouth daily. As needed   SILDENAFIL (VIAGRA) 50 MG TABLET    Take 1 tablet (50 mg total) by mouth daily as needed.   SIMVASTATIN (ZOCOR) 20 MG TABLET    TAKE 1 TABLET EVERY DAY TO LOWER CHOLESTROL  Modified Medications   Modified Medication Previous Medication   DIAZEPAM (VALIUM) 5 MG TABLET diazepam (VALIUM) 5 MG tablet      TAKE 1 TABLET FOUR TIMES A DAY AS NEEDED FOR NERVES    TAKE 1 TABLET FOUR TIMES A DAY AS NEEDED FOR NERVES   PENTAZOCINE-NALOXONE (TALWIN NX) 50-0.5 MG PER TABLET pentazocine-naloxone (TALWIN NX) 50-0.5 MG per tablet      Take 1 tablet by mouth every 4 (four) hours as needed for pain.    Take 1 tablet by mouth every 4 (four) hours as needed for pain.  Discontinued Medications   No medications on file     Review of Systems  Constitutional: Positive for fatigue. Negative for fever, chills, activity change, appetite change and unexpected weight change.  HENT: Positive for hearing loss. Negative for congestion, ear pain, rhinorrhea and tinnitus.   Eyes: Negative.   Respiratory: Negative for apnea, cough, choking, chest tightness, shortness of breath and wheezing.   Cardiovascular: Negative for chest pain, palpitations and leg swelling.  Gastrointestinal: Positive for constipation.  Endocrine: Negative for cold intolerance, heat intolerance, polydipsia,  polyphagia and polyuria.       Diabetic. Hypothyroid on supplements.  Genitourinary:       Nocturia.  Musculoskeletal: Positive for arthralgias.  Skin:       Chronic dry skin. Healing area of tick bite right shoulder.  Allergic/Immunologic: Negative.   Neurological:       Recurrent tension headaches.  Hematological: Negative.   Psychiatric/Behavioral:        Chronic anxiety. Some depressive symptoms. History of insomnia.    Filed Vitals:   09/16/14 1237  BP: 130/82  Pulse: 75  Temp: 98.1 F (36.7 C)  TempSrc: Oral  Height: 5\' 10"  (1.778 m)  Weight: 196 lb 12.8 oz (89.268 kg)  SpO2: 97%   Body mass index is 28.24  kg/(m^2).  Physical Exam  Constitutional: He is oriented to person, place, and time. He appears well-developed. No distress.  HENT:  Head: Normocephalic and atraumatic.  Right Ear: External ear normal.  Left Ear: External ear normal.  Nose: Nose normal.  Mouth/Throat: Oropharynx is clear and moist.  Mild loss of hearing.  Eyes: Conjunctivae and EOM are normal. Pupils are equal, round, and reactive to light.  Funduscopic does not show microaneurysms or hemorrhages.  Neck:  Upper and lower dentures. Stud in the tongue.  Cardiovascular: Normal rate, regular rhythm, normal heart sounds and intact distal pulses.  Exam reveals no gallop and no friction rub.   No murmur heard. Pulmonary/Chest: No respiratory distress. He has no wheezes. He has no rales. He exhibits no tenderness.  Abdominal: He exhibits no distension and no mass. There is no tenderness.  Genitourinary: Prostate normal. Guaiac negative stool.  Musculoskeletal: Normal range of motion. He exhibits no edema or tenderness.  Neurological: He is alert and oriented to person, place, and time. He has normal reflexes. No cranial nerve deficit. Coordination normal.  09/16/2014 6CIT= 6  Skin: No rash noted. No erythema. No pallor.  Multiple tattoos. Multiple piercings. Shave/moles. 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 Polypoid growth of the left lower eyelid. I do not feel any wires today and he does not either.  Psychiatric: He has a normal mood and affect. His behavior is normal. Thought content normal.     Labs reviewed: Appointment on 09/14/2014  Component Date Value Ref Range Status  . Hgb A1c MFr Bld 09/14/2014 6.9* 4.8 - 5.6 % Final   Comment:          Pre-diabetes: 5.7 - 6.4          Diabetes: >6.4          Glycemic control for adults with diabetes: <7.0   . Est. average glucose Bld gHb Est-m* 09/14/2014 151   Final  . Glucose 09/14/2014 140* 65 - 99 mg/dL Final  . BUN 09/14/2014 10  8 - 27 mg/dL  Final  . Creatinine, Ser 09/14/2014 1.13  0.76 - 1.27 mg/dL Final  . GFR calc non Af Amer 09/14/2014 69  >59 mL/min/1.73 Final  . GFR calc Af Amer 09/14/2014 80  >59 mL/min/1.73 Final  . BUN/Creatinine Ratio 09/14/2014 9* 10 - 22 Final  . Sodium 09/14/2014 143  134 - 144 mmol/L Final  . Potassium 09/14/2014 4.6  3.5 - 5.2 mmol/L Final  . Chloride 09/14/2014 99  97 - 108 mmol/L Final  . CO2 09/14/2014 26  18 - 29 mmol/L Final  . Calcium 09/14/2014 9.6  8.6 - 10.2 mg/dL Final  . Total Protein 09/14/2014 7.6  6.0 - 8.5 g/dL  Final  . Albumin 09/14/2014 4.8  3.6 - 4.8 g/dL Final  . Globulin, Total 09/14/2014 2.8  1.5 - 4.5 g/dL Final  . Albumin/Globulin Ratio 09/14/2014 1.7  1.1 - 2.5 Final  . Bilirubin Total 09/14/2014 0.6  0.0 - 1.2 mg/dL Final  . Alkaline Phosphatase 09/14/2014 138* 39 - 117 IU/L Final  . AST 09/14/2014 39  0 - 40 IU/L Final  . ALT 09/14/2014 30  0 - 44 IU/L Final  . TSH 09/14/2014 1.600  0.450 - 4.500 uIU/mL Final     Assessment/Plan  1. Type II diabetes mellitus with renal manifestations, uncontrolled Most recent A1c equaled 6.9 - Hemoglobin A1c; Future - Comprehensive metabolic panel; Future - Microalbumin, urine; Future  2. Essential hypertension Controlled - Comprehensive metabolic panel; Future  3. Hypothyroidism, unspecified hypothyroidism type Compensated - TSH; Future  4. Dyslipidemia - Lipid panel; Future  5. Chronic tension-type headache, not intractable - pentazocine-naloxone (TALWIN NX) 50-0.5 MG per tablet; Take 1 tablet by mouth every 4 (four) hours as needed for pain.  Dispense: 90 tablet; Refill: 0  6. Other type of migraine - pentazocine-naloxone (TALWIN NX) 50-0.5 MG per tablet; Take 1 tablet by mouth every 4 (four) hours as needed for pain.  Dispense: 90 tablet; Refill: 0  7. Coronary artery disease due to lipid rich plaque No recent chest pains or palpitations  8. Anxiety Chronic: Controlled  9. Depression Chronic and mild. Not  disabling.  10. PAD (peripheral artery disease) Diminished pedal pulses   11. Controlled type 2 DM with peripheral circulatory disorder Continue current medications

## 2014-09-20 ENCOUNTER — Encounter: Payer: Self-pay | Admitting: Internal Medicine

## 2014-09-20 DIAGNOSIS — E1151 Type 2 diabetes mellitus with diabetic peripheral angiopathy without gangrene: Secondary | ICD-10-CM

## 2014-09-20 DIAGNOSIS — I739 Peripheral vascular disease, unspecified: Secondary | ICD-10-CM | POA: Insufficient documentation

## 2014-09-20 HISTORY — DX: Type 2 diabetes mellitus with diabetic peripheral angiopathy without gangrene: E11.51

## 2014-09-20 HISTORY — DX: Peripheral vascular disease, unspecified: I73.9

## 2014-09-30 ENCOUNTER — Other Ambulatory Visit: Payer: Self-pay | Admitting: Internal Medicine

## 2014-10-05 ENCOUNTER — Other Ambulatory Visit: Payer: Self-pay | Admitting: *Deleted

## 2014-10-05 MED ORDER — SIMVASTATIN 20 MG PO TABS: ORAL_TABLET | ORAL | Status: DC

## 2014-10-05 MED ORDER — METOPROLOL TARTRATE 25 MG PO TABS: ORAL_TABLET | ORAL | Status: DC

## 2014-10-05 MED ORDER — LEVOTHYROXINE SODIUM 150 MCG PO TABS: ORAL_TABLET | ORAL | Status: DC

## 2014-10-05 NOTE — Telephone Encounter (Signed)
[facility] 

## 2014-11-09 ENCOUNTER — Other Ambulatory Visit: Payer: Self-pay | Admitting: Internal Medicine

## 2014-11-09 ENCOUNTER — Other Ambulatory Visit: Payer: Self-pay | Admitting: *Deleted

## 2014-11-09 MED ORDER — DIAZEPAM 5 MG PO TABS: ORAL_TABLET | ORAL | Status: DC

## 2014-12-20 ENCOUNTER — Other Ambulatory Visit: Payer: Self-pay | Admitting: Internal Medicine

## 2015-01-22 ENCOUNTER — Other Ambulatory Visit: Payer: Medicare Other

## 2015-01-22 DIAGNOSIS — E785 Hyperlipidemia, unspecified: Secondary | ICD-10-CM | POA: Diagnosis not present

## 2015-01-22 DIAGNOSIS — I10 Essential (primary) hypertension: Secondary | ICD-10-CM | POA: Diagnosis not present

## 2015-01-22 DIAGNOSIS — E1129 Type 2 diabetes mellitus with other diabetic kidney complication: Secondary | ICD-10-CM

## 2015-01-22 DIAGNOSIS — E1165 Type 2 diabetes mellitus with hyperglycemia: Principal | ICD-10-CM

## 2015-01-22 DIAGNOSIS — IMO0002 Reserved for concepts with insufficient information to code with codable children: Secondary | ICD-10-CM

## 2015-01-22 DIAGNOSIS — E039 Hypothyroidism, unspecified: Secondary | ICD-10-CM | POA: Diagnosis not present

## 2015-01-23 LAB — COMPREHENSIVE METABOLIC PANEL
ALBUMIN: 4.5 g/dL (ref 3.6–4.8)
ALT: 26 IU/L (ref 0–44)
AST: 33 IU/L (ref 0–40)
Albumin/Globulin Ratio: 1.6 (ref 1.1–2.5)
Alkaline Phosphatase: 126 IU/L — ABNORMAL HIGH (ref 39–117)
BUN/Creatinine Ratio: 10 (ref 10–22)
BUN: 11 mg/dL (ref 8–27)
Bilirubin Total: 0.8 mg/dL (ref 0.0–1.2)
CALCIUM: 9.5 mg/dL (ref 8.6–10.2)
CO2: 26 mmol/L (ref 18–29)
CREATININE: 1.15 mg/dL (ref 0.76–1.27)
Chloride: 95 mmol/L — ABNORMAL LOW (ref 97–108)
GFR, EST AFRICAN AMERICAN: 78 mL/min/{1.73_m2} (ref >59)
GFR, EST NON AFRICAN AMERICAN: 67 mL/min/{1.73_m2} (ref >59)
GLUCOSE: 139 mg/dL — AB (ref 65–99)
Globulin, Total: 2.8 g/dL (ref 1.5–4.5)
Potassium: 4.2 mmol/L (ref 3.5–5.2)
Sodium: 139 mmol/L (ref 134–144)
TOTAL PROTEIN: 7.3 g/dL (ref 6.0–8.5)

## 2015-01-23 LAB — LIPID PANEL
CHOL/HDL RATIO: 5.5 ratio — AB (ref 0.0–5.0)
Cholesterol, Total: 172 mg/dL (ref 100–199)
HDL: 31 mg/dL — AB (ref >39)
LDL CALC: 65 mg/dL (ref 0–99)
Triglycerides: 381 mg/dL — ABNORMAL HIGH (ref 0–149)
VLDL Cholesterol Cal: 76 mg/dL — ABNORMAL HIGH (ref 5–40)

## 2015-01-23 LAB — MICROALBUMIN, URINE: Microalbumin, Urine: 9.9 ug/mL

## 2015-01-23 LAB — TSH: TSH: 3.43 u[IU]/mL (ref 0.450–4.500)

## 2015-01-23 LAB — HEMOGLOBIN A1C
Est. average glucose Bld gHb Est-mCnc: 169 mg/dL
Hgb A1c MFr Bld: 7.5 % — ABNORMAL HIGH (ref 4.8–5.6)

## 2015-01-25 ENCOUNTER — Other Ambulatory Visit: Payer: Medicare Other

## 2015-01-27 ENCOUNTER — Encounter: Payer: Self-pay | Admitting: Internal Medicine

## 2015-01-27 ENCOUNTER — Ambulatory Visit (INDEPENDENT_AMBULATORY_CARE_PROVIDER_SITE_OTHER): Payer: Medicare Other | Admitting: Internal Medicine

## 2015-01-27 VITALS — BP 138/80 | HR 78 | Temp 98.3°F | Resp 20 | Ht 70.0 in | Wt 206.0 lb

## 2015-01-27 DIAGNOSIS — E1151 Type 2 diabetes mellitus with diabetic peripheral angiopathy without gangrene: Secondary | ICD-10-CM | POA: Diagnosis not present

## 2015-01-27 DIAGNOSIS — E1122 Type 2 diabetes mellitus with diabetic chronic kidney disease: Secondary | ICD-10-CM

## 2015-01-27 DIAGNOSIS — Z23 Encounter for immunization: Secondary | ICD-10-CM

## 2015-01-27 DIAGNOSIS — G43809 Other migraine, not intractable, without status migrainosus: Secondary | ICD-10-CM

## 2015-01-27 DIAGNOSIS — F32A Depression, unspecified: Secondary | ICD-10-CM

## 2015-01-27 DIAGNOSIS — F329 Major depressive disorder, single episode, unspecified: Secondary | ICD-10-CM | POA: Diagnosis not present

## 2015-01-27 DIAGNOSIS — I798 Other disorders of arteries, arterioles and capillaries in diseases classified elsewhere: Secondary | ICD-10-CM | POA: Diagnosis not present

## 2015-01-27 DIAGNOSIS — E785 Hyperlipidemia, unspecified: Secondary | ICD-10-CM | POA: Diagnosis not present

## 2015-01-27 DIAGNOSIS — I10 Essential (primary) hypertension: Secondary | ICD-10-CM | POA: Diagnosis not present

## 2015-01-27 DIAGNOSIS — N189 Chronic kidney disease, unspecified: Secondary | ICD-10-CM

## 2015-01-27 DIAGNOSIS — G44229 Chronic tension-type headache, not intractable: Secondary | ICD-10-CM

## 2015-01-27 DIAGNOSIS — E039 Hypothyroidism, unspecified: Secondary | ICD-10-CM

## 2015-01-27 DIAGNOSIS — I2510 Atherosclerotic heart disease of native coronary artery without angina pectoris: Secondary | ICD-10-CM | POA: Diagnosis not present

## 2015-01-27 DIAGNOSIS — I2583 Coronary atherosclerosis due to lipid rich plaque: Secondary | ICD-10-CM

## 2015-01-27 MED ORDER — POLYETHYLENE GLYCOL 3350 17 G PO PACK: 17.0000 g | PACK | Freq: Every day | ORAL | Status: DC

## 2015-01-27 MED ORDER — PENTAZOCINE-NALOXONE 50-0.5 MG PO TABS: 1.0000 | ORAL_TABLET | ORAL | Status: DC | PRN

## 2015-01-27 MED ORDER — MOMETASONE FUROATE 0.1 % EX CREA: TOPICAL_CREAM | CUTANEOUS | Status: DC

## 2015-01-27 MED ORDER — DIAZEPAM 5 MG PO TABS: ORAL_TABLET | ORAL | Status: DC

## 2015-01-27 NOTE — Patient Instructions (Signed)
Take imipramine as directed.

## 2015-01-27 NOTE — Progress Notes (Signed)
Patient ID: Dean Cole, male   DOB: 07-12-1950, 64 y.o.   MRN: [id number]    Facility  [facility]    Place of Service:   OFFICE    Allergies  Allergen Reactions  . Eggs Or Egg-Derived Products Other (See Comments)    Bloating, nausea, and no rash   . Adhesive [Tape] Swelling    Chief Complaint  Patient presents with  . Medical Management of Chronic Issues    HPI:   Essential hypertension - controlled  Controlled type 2 DM with peripheral circulatory disorder - poor dietary conmliance. Eats cookies.  Type 2 diabetes mellitus with diabetic chronic kidney disease - not on medications yet.   Hypothyroidism, unspecified hypothyroidism type - compensated  Coronary artery disease due to lipid rich plaque - denies chest pain or dyspnea  Depression - not using the imipramine as directed. Skips the morning dose.  Chronic tension-type headache, not intractable - under a lot of stress  Other type of migraine - benefits pentazocine-naloxone (TALWIN NX) 50-0.5 MG tablet  Dyslipidemia - low HDL. usin simvastatin to lower LDL. Triglycerides high, likely due to diabetes.    Medications: Patient's Medications  New Prescriptions   No medications on file  Previous Medications   ASPIRIN EC 325 MG EC TABLET    Take 1 tablet (325 mg total) by mouth daily.   FUROSEMIDE (LASIX) 40 MG TABLET    Take 1 tablet (40 mg total) by mouth daily as needed.   IMIPRAMINE (TOFRANIL) 25 MG TABLET    TAKE 3 TABLETS BY MOUTH EVERY MORNING AND 4 TABLET AT BEDTIME   LEVOTHYROXINE (SYNTHROID, LEVOTHROID) 150 MCG TABLET    Take one tablet by mouth 30 minutes before breakfast once daily for thyroid   METOPROLOL TARTRATE (LOPRESSOR) 25 MG TABLET    Take one tablet by mouth twice daily   NITROSTAT 0.4 MG SL TABLET    Place 0.4 mg under the tongue every 5 (five) minutes as needed. For chest pain   SILDENAFIL (VIAGRA) 50 MG TABLET    Take 1 tablet (50 mg total) by mouth daily as needed.   SIMVASTATIN (ZOCOR) 20  MG TABLET    Take one tablet by mouth once daily for cholesterol  Modified Medications   Modified Medication Previous Medication   DIAZEPAM (VALIUM) 5 MG TABLET diazepam (VALIUM) 5 MG tablet      TAKE 1 TABLET 4 TIMES A DAY AS NEEDED FOR NERVES    TAKE 1 TABLET 4 TIMES A DAY AS NEEDED FOR NERVES   MOMETASONE (ELOCON) 0.1 % CREAM mometasone (ELOCON) 0.1 % cream      USE DAILY TO CONTROL ECZEMA    USE DAILY TO CONTROL ECZEMA   PENTAZOCINE-NALOXONE (TALWIN NX) 50-0.5 MG TABLET pentazocine-naloxone (TALWIN NX) 50-0.5 MG per tablet      Take 1 tablet by mouth every 4 (four) hours as needed for pain.    Take 1 tablet by mouth every 4 (four) hours as needed for pain.   POLYETHYLENE GLYCOL (MIRALAX / GLYCOLAX) PACKET polyethylene glycol (MIRALAX / GLYCOLAX) packet      Take 17 g by mouth daily. As needed    Take 17 g by mouth daily. As needed  Discontinued Medications   No medications on file     Review of Systems  Constitutional: Positive for fatigue. Negative for fever, chills, activity change, appetite change and unexpected weight change.  HENT: Positive for hearing loss. Negative for congestion, ear pain, rhinorrhea and tinnitus.  Eyes: Negative.   Respiratory: Negative for apnea, cough, choking, chest tightness, shortness of breath and wheezing.   Cardiovascular: Negative for chest pain, palpitations and leg swelling.  Gastrointestinal: Positive for constipation.  Endocrine: Negative for cold intolerance, heat intolerance, polydipsia, polyphagia and polyuria.       Diabetic. Hypothyroid on supplements.  Genitourinary:       Nocturia.  Musculoskeletal: Positive for arthralgias.  Skin:       Chronic dry skin. Healing area of tick bite right shoulder.  Allergic/Immunologic: Negative.   Neurological:       Recurrent tension headaches.  Hematological: Negative.   Psychiatric/Behavioral:        Chronic anxiety. Some depressive symptoms. History of insomnia.    Filed Vitals:   01/27/15  1116  BP: 138/80  Pulse: 78  Temp: 98.3 F (36.8 C)  TempSrc: Oral  Resp: 20  Height: 5' 10" (1.778 m)  Weight: 206 lb (93.441 kg)  SpO2: 99%   Body mass index is 29.56 kg/(m^2).  Physical Exam  Constitutional: He is oriented to person, place, and time. He appears well-developed. No distress.  HENT:  Head: Normocephalic and atraumatic.  Right Ear: External ear normal.  Left Ear: External ear normal.  Nose: Nose normal.  Mouth/Throat: Oropharynx is clear and moist.  Mild loss of hearing.  Eyes: Conjunctivae and EOM are normal. Pupils are equal, round, and reactive to light.  Funduscopic does not show microaneurysms or hemorrhages.  Neck:  Upper and lower dentures. Stud in the tongue.  Cardiovascular: Normal rate, regular rhythm, normal heart sounds and intact distal pulses.  Exam reveals no gallop and no friction rub.   No murmur heard. Pulmonary/Chest: No respiratory distress. He has no wheezes. He has no rales. He exhibits no tenderness.  Abdominal: He exhibits no distension and no mass. There is no tenderness.  Genitourinary: Prostate normal. Guaiac negative stool.  Musculoskeletal: Normal range of motion. He exhibits no edema or tenderness.  Neurological: He is alert and oriented to person, place, and time. He has normal reflexes. No cranial nerve deficit. Coordination normal.  09/16/2014 6CIT= 6  Skin: No rash noted. No erythema. No pallor.  Multiple tattoos. Multiple piercings. Shave/moles. 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 Polypoid growth of the left lower eyelid. I do not feel any wires today and he does not either.  Psychiatric: He has a normal mood and affect. His behavior is normal. Thought content normal.     Labs reviewed: Lab Summary Latest Ref Rng 01/22/2015 09/14/2014 06/08/2014  Hemoglobin 13.0-17.0 g/dL (None) (None) (None)  Hematocrit 39.0-52.0 % (None) (None) (None)  White count - (None) (None) (None)  Platelet count - (None) (None)  (None)  Sodium 134 - 144 mmol/L 139 143 139  Potassium 3.5 - 5.2 mmol/L 4.2 4.6 3.7  Calcium 8.6 - 10.2 mg/dL 9.5 9.6 9.0  Phosphorus - (None) (None) (None)  Creatinine 0.76 - 1.27 mg/dL 1.15 1.13 1.11  AST 0 - 40 IU/L 33 39 31  Alk Phos 39 - 117 IU/L 126(H) 138(H) 141(H)  Bilirubin 0.0 - 1.2 mg/dL 0.8 0.6 0.6  Glucose 65 - 99 mg/dL 139(H) 140(H) 158(H)  Cholesterol - (None) (None) (None)  HDL cholesterol >39 mg/dL 31(L) (None) (None)  Triglycerides 0 - 149 mg/dL 381(H) (None) (None)  LDL Direct - (None) (None) (None)  LDL Calc 0 - 99 mg/dL 65 (None) (None)  Total protein - (None) (None) (None)  Albumin 3.6 - 4.8 g/dL 4.5 4.8 4.4  Lab Results  Component Value Date   TSH 3.430 01/22/2015   Lab Results  Component Value Date   BUN 11 01/22/2015   Lab Results  Component Value Date   HGBA1C 7.5* 01/22/2015    Assessment/Plan 1. Essential hypertension - Comprehensive metabolic panel; Future  2. Controlled type 2 DM with peripheral circulatory disorder - Hemoglobin A1c; Future - Comprehensive metabolic panel; Future  3. Type 2 diabetes mellitus with diabetic chronic kidney disease Poor dietary compliance. Not on medications yet.  4. Hypothyroidism, unspecified hypothyroidism type - TSH; Future  5. Coronary artery disease due to lipid rich plaque asymptomatic  6. Depression Use imipramine as prescribed  7. Chronic tension-type headache, not intractable - pentazocine-naloxone (TALWIN NX) 50-0.5 MG tablet; Take 1 tablet by mouth every 4 (four) hours as needed for pain.  Dispense: 90 tablet; Refill: 0  8. Other type of migraine - pentazocine-naloxone (TALWIN NX) 50-0.5 MG tablet; Take 1 tablet by mouth every 4 (four) hours as needed for pain.  Dispense: 90 tablet; Refill: 0  9. Dyslipidemia - Lipid panel; Future

## 2015-01-27 NOTE — Addendum Note (Signed)
Addended by: Eilene Ghazi on: 01/27/2015 12:40 PM   Modules accepted: Orders

## 2015-02-22 ENCOUNTER — Ambulatory Visit: Payer: Medicare Other | Admitting: Cardiology

## 2015-02-27 ENCOUNTER — Other Ambulatory Visit: Payer: Self-pay | Admitting: Internal Medicine

## 2015-03-04 ENCOUNTER — Ambulatory Visit (INDEPENDENT_AMBULATORY_CARE_PROVIDER_SITE_OTHER): Payer: Medicare Other | Admitting: Cardiology

## 2015-03-04 VITALS — BP 124/72 | HR 74 | Ht 70.0 in | Wt 205.8 lb

## 2015-03-04 DIAGNOSIS — E7800 Pure hypercholesterolemia, unspecified: Secondary | ICD-10-CM

## 2015-03-04 DIAGNOSIS — I259 Chronic ischemic heart disease, unspecified: Secondary | ICD-10-CM

## 2015-03-04 DIAGNOSIS — I10 Essential (primary) hypertension: Secondary | ICD-10-CM | POA: Diagnosis not present

## 2015-03-04 NOTE — Progress Notes (Signed)
Cardiology Office Note   Date:  03/04/2015   ID:  Dean Cole, DOB Sep 06, 1950, MRN [id number]  PCP:  Dean Dooms, MD  Cardiologist: Dean Coco MD  Chief Complaint  Patient presents with  . Coronary Artery Disease      History of Present Illness: Dean Cole is a 64 y.o. male who presents for a six-month follow-up visit  This pleasant 64 year old gentleman is seen back for a scheduled followup office visit. He has a history of ischemic heart disease and underwent coronary artery bypass grafting x4 on 07/10/11 by Dean Cole. Dean Cole. He had severe multivessel coronary artery disease. Patient also has a history of high blood pressure, diabetes, hypothyroidism, and mild renal insufficiency. Since his heart surgery in March 2013 the patient was readmitted to the hospital 6 weeks later with acute cholecystitis and underwent cholecystectomy by Dean Cole. Dean Cole. Since then the patient has been feeling well except for problems with mild fluid retention. He has noted occasional ankle edema. He has to take a Lasix one to 3 times per month for mild edema. . Previously he had been chewing tobacco but he has not used tobacco since his bypass surgery. He does not drink any alcohol. The patient has hypercholesterolemia and is on and is on simvastatin 20 mg daily. Dean Cole checks his labs. The patient has a past history of paroxysmal supraventricular tachycardia. The patient has had problems with erectile dysfunction. He was hoping that this would improve after bypass surgery but it has not. He is interested in possible use of Viagra. He does not take nitrates.  However he is not yet purchased the Viagra because of the expense.  The patient has not been experiencing any chest pain or angina. Exercise tolerance is stable  Past Medical History  Diagnosis Date  . HTN (hypertension)   . Hypothyroidism   . Borderline diabetes   . Coronary artery disease   . Angina   . Shortness of  breath   . Cancer     skin cancer  basel cell carcinoma  . Headache(784.0)   . Restless leg syndrome   . Cholecystitis   . Blood transfusion     "no reaction to transfusions"  . Anxiety   . PAD (peripheral artery disease) 09/20/2014  . Controlled type 2 DM with peripheral circulatory disorder 09/20/2014    Past Surgical History  Procedure Laterality Date  . Cardiac catheterization  07/07/2011  . Coronary artery bypass graft  07/10/2011    Procedure: CORONARY ARTERY BYPASS GRAFTING (CABG);  Surgeon: Dean Poot, MD;  Location: [facility];  Service: Open Heart Surgery;  Laterality: N/A;  . Excisional hemorrhoidectomy  1997  . Cholecystectomy  09/14/2011    Procedure: LAPAROSCOPIC CHOLECYSTECTOMY WITH INTRAOPERATIVE CHOLANGIOGRAM;  Surgeon: Dean Ober, MD;  Location: [facility];  Service: General;  Laterality: N/A;  . Hemorroidectomy  2000  . Basal cell carcinoma excision  2000    Dean Cole,Dean Cole   . Left heart catheterization with coronary angiogram N/A 07/07/2011    Procedure: LEFT HEART CATHETERIZATION WITH CORONARY ANGIOGRAM;  Surgeon: Dean Blanks, MD;  [facility];  Service: Cardiovascular;  Laterality: N/A;     Current Outpatient Prescriptions  Medication Sig Dispense Refill  . aspirin EC 325 MG EC tablet Take 1 tablet (325 mg total) by mouth daily. 30 tablet   . diazepam (VALIUM) 5 MG tablet Take 5 mg by mouth 4 (four) times daily as needed for anxiety (anxiety).    Marland Kitchen  furosemide (LASIX) 40 MG tablet Take 40 mg by mouth daily as needed (edema).    . imipramine (TOFRANIL) 25 MG tablet Take by mouth. Take 3 tablets by mouth in the morning and 4 tablets by mouth at bedtime    . levothyroxine (SYNTHROID, LEVOTHROID) 150 MCG tablet Take one tablet by mouth 30 minutes before breakfast once daily for thyroid 90 tablet 3  . metoprolol tartrate (LOPRESSOR) 25 MG tablet Take one tablet by mouth twice daily 180 tablet 3  . mometasone (ELOCON) 0.1 % cream Apply 1 application topically  daily. For eczema    . NITROSTAT 0.4 MG SL tablet Place 0.4 mg under the tongue every 5 (five) minutes as needed. For chest pain    . pentazocine-naloxone (TALWIN NX) 50-0.5 MG tablet Take 1 tablet by mouth every 4 (four) hours as needed for pain. 90 tablet 0  . polyethylene glycol (MIRALAX / GLYCOLAX) packet Take 17 g by mouth daily as needed (constipation).    . sildenafil (VIAGRA) 50 MG tablet Take 50 mg by mouth daily as needed for erectile dysfunction (erectile dysfunction).    . simvastatin (ZOCOR) 20 MG tablet Take one tablet by mouth once daily for cholesterol 90 tablet 3   No current facility-administered medications for this visit.    Allergies:   Eggs or egg-derived products and Adhesive    Social History:  The patient  reports that he quit smoking about 33 years ago. He has quit using smokeless tobacco. He reports that he does not drink alcohol or use illicit drugs.   Family History:  The patient's family history includes Coronary artery disease in his father and paternal grandfather; Suicidality in his brother.    ROS:  Please see the history of present illness.   Otherwise, review of systems are positive for none.   All other systems are reviewed and negative.    PHYSICAL EXAM: VS:  BP 124/72 mmHg  Pulse 74  Ht 5\' 10"  (1.778 m)  Wt 205 lb 12.8 oz (93.35 kg)  BMI 29.53 kg/m2 , BMI Body mass index is 29.53 kg/(m^2). GEN: Well nourished, well developed, in no acute distress HEENT: normal Neck: no JVD, carotid bruits, or masses Cardiac: RRR; no murmurs, rubs, or gallops,no edema  Respiratory:  clear to auscultation bilaterally, normal work of breathing GI: soft, nontender, nondistended, + BS MS: no deformity or atrophy Skin: warm and dry, no rash Neuro:  Strength and sensation are intact Psych: euthymic mood, full affect   EKG:  EKG is ordered today. The ekg ordered today demonstrates normal sinus rhythm. voltage for LVH, maybe normal variant   Recent  Labs: 01/22/2015: ALT 26; BUN 11; Creatinine, Ser 1.15; Potassium 4.2; Sodium 139; TSH 3.430    Lipid Panel    Component Value Date/Time   CHOL 172 01/22/2015 0915   CHOL 200 07/09/2011 0900   TRIG 381* 01/22/2015 0915   HDL 31* 01/22/2015 0915   HDL 31* 07/09/2011 0900   CHOLHDL 5.5* 01/22/2015 0915   CHOLHDL 6.5 07/09/2011 0900   VLDL 62* 07/09/2011 0900   LDLCALC 65 01/22/2015 0915   LDLCALC 107* 07/09/2011 0900      Wt Readings from Last 3 Encounters:  03/04/15 205 lb 12.8 oz (93.35 kg)  01/27/15 206 lb (93.441 kg)  09/16/14 196 lb 12.8 oz (89.268 kg)        ASSESSMENT AND PLAN:  1. ischemic heart disease status post CABG x4 on 07/10/11 by Dean Cole. Dean Cole 2. Status post cholecystectomy  3. essential hypertension without heart failure 4. diabetes mellitus, adult onset 5. Hypothyroidism 6. mild renal insufficiency 7. Erectile dysfunction   Current medicines are reviewed at length with the patient today.  The patient does not have concerns regarding medicines.  The following changes have been made:  no change  Labs/ tests ordered today include:   Orders Placed This Encounter  Procedures  . EKG 12-Lead    Disposition: Continue on same medication.  Recheck in 6 months.  He needs to try to get more aerobic exercise.  His wife states that he is very sedentary at home.    Dean Spare MD 03/04/2015 5:44 PM    [facility] [address], [place], [place]  [address] Phone: [phone]; Fax: [phone]

## 2015-03-04 NOTE — Patient Instructions (Signed)
Medication Instructions:  Your physician recommends that you continue on your current medications as directed. Please refer to the Current Medication list given to you today.  Labwork: none  Testing/Procedures: none  Follow-Up: Your physician recommends that you schedule a follow-up appointment in: 6 month ov with Lori G NP or Scott W PA  If you need a refill on your cardiac medications before your next appointment, please call your pharmacy.  

## 2015-04-08 ENCOUNTER — Telehealth: Payer: Self-pay

## 2015-04-08 NOTE — Telephone Encounter (Signed)
I called patient, patient stated he is established with Dr.Miller (eye doctor) and he last seen her a a year ago. Patient did not wish to schedule an eye exam at this time. Patient state he will call Dr.Miller when he is ready to schedule. Patient still not interested in scheduling a colonoscopy either.

## 2015-04-09 ENCOUNTER — Other Ambulatory Visit: Payer: Self-pay | Admitting: Internal Medicine

## 2015-05-10 ENCOUNTER — Other Ambulatory Visit: Payer: Self-pay | Admitting: Internal Medicine

## 2015-05-17 ENCOUNTER — Other Ambulatory Visit: Payer: Medicare Other

## 2015-05-19 ENCOUNTER — Ambulatory Visit: Payer: Medicare Other | Admitting: Internal Medicine

## 2015-06-04 ENCOUNTER — Other Ambulatory Visit: Payer: Medicare Other

## 2015-06-04 DIAGNOSIS — I10 Essential (primary) hypertension: Secondary | ICD-10-CM

## 2015-06-04 DIAGNOSIS — E785 Hyperlipidemia, unspecified: Secondary | ICD-10-CM | POA: Diagnosis not present

## 2015-06-04 DIAGNOSIS — E1151 Type 2 diabetes mellitus with diabetic peripheral angiopathy without gangrene: Secondary | ICD-10-CM

## 2015-06-04 DIAGNOSIS — E039 Hypothyroidism, unspecified: Secondary | ICD-10-CM

## 2015-06-05 LAB — LIPID PANEL
CHOLESTEROL TOTAL: 162 mg/dL (ref 100–199)
Chol/HDL Ratio: 5.4 ratio units — ABNORMAL HIGH (ref 0.0–5.0)
HDL: 30 mg/dL — ABNORMAL LOW (ref >39)
Triglycerides: 404 mg/dL — ABNORMAL HIGH (ref 0–149)

## 2015-06-05 LAB — COMPREHENSIVE METABOLIC PANEL
ALBUMIN: 4.4 g/dL (ref 3.6–4.8)
ALK PHOS: 134 IU/L — AB (ref 39–117)
ALT: 24 IU/L (ref 0–44)
AST: 28 IU/L (ref 0–40)
Albumin/Globulin Ratio: 1.5 (ref 1.1–2.5)
BUN / CREAT RATIO: 11 (ref 10–22)
BUN: 12 mg/dL (ref 8–27)
Bilirubin Total: 0.5 mg/dL (ref 0.0–1.2)
CALCIUM: 9.3 mg/dL (ref 8.6–10.2)
CO2: 26 mmol/L (ref 18–29)
CREATININE: 1.08 mg/dL (ref 0.76–1.27)
Chloride: 96 mmol/L (ref 96–106)
GFR calc Af Amer: 83 mL/min/{1.73_m2} (ref >59)
GFR, EST NON AFRICAN AMERICAN: 72 mL/min/{1.73_m2} (ref >59)
Globulin, Total: 3 g/dL (ref 1.5–4.5)
Glucose: 126 mg/dL — ABNORMAL HIGH (ref 65–99)
Potassium: 4.1 mmol/L (ref 3.5–5.2)
Sodium: 139 mmol/L (ref 134–144)
TOTAL PROTEIN: 7.4 g/dL (ref 6.0–8.5)

## 2015-06-05 LAB — HEMOGLOBIN A1C
ESTIMATED AVERAGE GLUCOSE: 194 mg/dL
HEMOGLOBIN A1C: 8.4 % — AB (ref 4.8–5.6)

## 2015-06-05 LAB — TSH: TSH: 5.05 u[IU]/mL — ABNORMAL HIGH (ref 0.450–4.500)

## 2015-06-09 ENCOUNTER — Ambulatory Visit (INDEPENDENT_AMBULATORY_CARE_PROVIDER_SITE_OTHER): Payer: Medicare Other | Admitting: Internal Medicine

## 2015-06-09 ENCOUNTER — Encounter: Payer: Self-pay | Admitting: Internal Medicine

## 2015-06-09 VITALS — BP 126/80 | HR 68 | Temp 98.5°F | Resp 14 | Ht 70.0 in | Wt 209.0 lb

## 2015-06-09 DIAGNOSIS — I10 Essential (primary) hypertension: Secondary | ICD-10-CM | POA: Diagnosis not present

## 2015-06-09 DIAGNOSIS — E039 Hypothyroidism, unspecified: Secondary | ICD-10-CM

## 2015-06-09 DIAGNOSIS — IMO0002 Reserved for concepts with insufficient information to code with codable children: Secondary | ICD-10-CM | POA: Insufficient documentation

## 2015-06-09 DIAGNOSIS — E1151 Type 2 diabetes mellitus with diabetic peripheral angiopathy without gangrene: Secondary | ICD-10-CM | POA: Insufficient documentation

## 2015-06-09 DIAGNOSIS — G43809 Other migraine, not intractable, without status migrainosus: Secondary | ICD-10-CM | POA: Diagnosis not present

## 2015-06-09 DIAGNOSIS — E1165 Type 2 diabetes mellitus with hyperglycemia: Secondary | ICD-10-CM | POA: Diagnosis not present

## 2015-06-09 DIAGNOSIS — E785 Hyperlipidemia, unspecified: Secondary | ICD-10-CM | POA: Diagnosis not present

## 2015-06-09 DIAGNOSIS — G44229 Chronic tension-type headache, not intractable: Secondary | ICD-10-CM | POA: Diagnosis not present

## 2015-06-09 MED ORDER — CVS FISH OIL 1200 MG PO CAPS: ORAL_CAPSULE | ORAL | Status: DC

## 2015-06-09 MED ORDER — PENTAZOCINE-NALOXONE 50-0.5 MG PO TABS: 1.0000 | ORAL_TABLET | ORAL | Status: DC | PRN

## 2015-06-09 MED ORDER — OSELTAMIVIR PHOSPHATE 75 MG PO CAPS: ORAL_CAPSULE | ORAL | Status: DC

## 2015-06-09 MED ORDER — DIAZEPAM 5 MG PO TABS: ORAL_TABLET | ORAL | Status: DC

## 2015-06-09 NOTE — Progress Notes (Signed)
Patient ID: Dean Cole, male   DOB: [birth year], 65 y.o.   MRN: [id number]    Facility  [facility]    Place of Service:   OFFICE    Allergies  Allergen Reactions  . Eggs Or Egg-Derived Products Other (See Comments)    Bloating, nausea, and no rash   . Adhesive [Tape] Swelling    Chief Complaint  Patient presents with  . Medical Management of Chronic Issues    4 month follow-up, discuss labs (copy printed)   . Immunizations    Refused all recommended vaccines     HPI:   Son recently had influenza. Wife appears to have it now.  Uncontrolled type 2 DM with peripheral circulatory disorder ([place]) - patient has been drinking up to 10 cans of regular Pepsi daily. He has no dietary compliance. Hemoglobin A1c has risen to 8.4. He is gaining weight.  Hypothyroidism, unspecified hypothyroidism type - continues to take levothyroxine regularly.  Essential hypertension - controlled  Dyslipidemia triglycerides - continue to rise. They may fall with better dietary compliance -     Medications: Patient's Medications  New Prescriptions   No medications on file  Previous Medications   ASPIRIN EC 325 MG EC TABLET    Take 1 tablet (325 mg total) by mouth daily.   DIAZEPAM (VALIUM) 5 MG TABLET    TAKE 1 TABLET 4 TIMES A DAY AS NEEDED FOR NERVES   FUROSEMIDE (LASIX) 40 MG TABLET    Take 40 mg by mouth daily as needed (edema).   IMIPRAMINE (TOFRANIL) 25 MG TABLET    Take by mouth. Take 3 tablets by mouth in the morning and 4 tablets by mouth at bedtime   LEVOTHYROXINE (SYNTHROID, LEVOTHROID) 150 MCG TABLET    Take one tablet by mouth 30 minutes before breakfast once daily for thyroid   METOPROLOL TARTRATE (LOPRESSOR) 25 MG TABLET    Take one tablet by mouth twice daily   MOMETASONE (ELOCON) 0.1 % CREAM    Apply 1 application topically daily. For eczema   NITROSTAT 0.4 MG SL TABLET    Place 0.4 mg under the tongue every 5 (five) minutes as needed. For chest pain   PENTAZOCINE-NALOXONE (TALWIN  NX) 50-0.5 MG TABLET    Take 1 tablet by mouth every 4 (four) hours as needed for pain.   POLYETHYLENE GLYCOL (MIRALAX / GLYCOLAX) PACKET    Take 17 g by mouth daily as needed (constipation).   SILDENAFIL (VIAGRA) 50 MG TABLET    Take 50 mg by mouth daily as needed for erectile dysfunction (erectile dysfunction).   SIMVASTATIN (ZOCOR) 20 MG TABLET    Take one tablet by mouth once daily for cholesterol  Modified Medications   No medications on file  Discontinued Medications   No medications on file    Review of Systems  Constitutional: Positive for fatigue. Negative for fever, chills, activity change, appetite change and unexpected weight change.  HENT: Positive for hearing loss. Negative for congestion, ear pain, rhinorrhea and tinnitus.   Eyes: Negative.   Respiratory: Negative for apnea, cough, choking, chest tightness, shortness of breath and wheezing.   Cardiovascular: Negative for chest pain, palpitations and leg swelling.  Gastrointestinal: Positive for constipation.  Endocrine: Negative for cold intolerance, heat intolerance, polydipsia, polyphagia and polyuria.       Diabetic. Hypothyroid on supplements.  Genitourinary:       Nocturia.  Musculoskeletal: Positive for arthralgias.  Skin:       Chronic dry skin.  Allergic/Immunologic: Negative.   Neurological:       Recurrent tension headaches.  Hematological: Negative.   Psychiatric/Behavioral:        Chronic anxiety. Some depressive symptoms. History of insomnia.    Filed Vitals:   06/09/15 1130  BP: 126/80  Pulse: 68  Temp: 98.5 F (36.9 C)  TempSrc: Oral  Resp: 14  Height: 5' 10"  (1.778 m)  Weight: 209 lb (94.802 kg)  SpO2: 97%   Body mass index is 29.99 kg/(m^2). Filed Weights   06/09/15 1130  Weight: 209 lb (94.802 kg)     Physical Exam  Constitutional: He is oriented to person, place, and time. He appears well-developed. No distress.  HENT:  Head: Normocephalic and atraumatic.  Right Ear: External  ear normal.  Left Ear: External ear normal.  Nose: Nose normal.  Mouth/Throat: Oropharynx is clear and moist.  Mild loss of hearing.  Eyes: Conjunctivae and EOM are normal. Pupils are equal, round, and reactive to light.  Funduscopic does not show microaneurysms or hemorrhages.  Neck:  Upper and lower dentures. Stud in the tongue.  Cardiovascular: Normal rate, regular rhythm, normal heart sounds and intact distal pulses.  Exam reveals no gallop and no friction rub.   No murmur heard. Pulmonary/Chest: No respiratory distress. He has no wheezes. He has no rales. He exhibits no tenderness.  Abdominal: He exhibits no distension and no mass. There is no tenderness.  Genitourinary: Prostate normal. Guaiac negative stool.  Musculoskeletal: Normal range of motion. He exhibits no edema or tenderness.  Neurological: He is alert and oriented to person, place, and time. He has normal reflexes. No cranial nerve deficit. Coordination normal.  09/16/2014 6CIT= 6  Skin: No rash noted. No erythema. No pallor.  Multiple tattoos. Multiple piercings. Shave/moles. 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 Polypoid growth of the left lower eyelid.  Psychiatric: He has a normal mood and affect. His behavior is normal. Thought content normal.    Labs reviewed: Lab Summary Latest Ref Rng 06/04/2015 01/22/2015 09/14/2014  Hemoglobin 13.0-17.0 g/dL (None) (None) (None)  Hematocrit 39.0-52.0 % (None) (None) (None)  White count - (None) (None) (None)  Platelet count - (None) (None) (None)  Sodium 134 - 144 mmol/L 139 139 143  Potassium 3.5 - 5.2 mmol/L 4.1 4.2 4.6  Calcium 8.6 - 10.2 mg/dL 9.3 9.5 9.6  Phosphorus - (None) (None) (None)  Creatinine 0.76 - 1.27 mg/dL 1.08 1.15 1.13  AST 0 - 40 IU/L 28 33 39  Alk Phos 39 - 117 IU/L 134(H) 126(H) 138(H)  Bilirubin 0.0 - 1.2 mg/dL 0.5 0.8 0.6  Glucose 65 - 99 mg/dL 126(H) 139(H) 140(H)  Cholesterol - (None) (None) (None)  HDL cholesterol >39 mg/dL  30(L) 31(L) (None)  Triglycerides 0 - 149 mg/dL 404(H) 381(H) (None)  LDL Direct - (None) (None) (None)  LDL Calc 0 - 99 mg/dL Comment 65 (None)  Total protein - (None) (None) (None)  Albumin 3.6 - 4.8 g/dL 4.4 4.5 4.8   Lab Results  Component Value Date   TSH 5.050* 06/04/2015   TSH 3.430 01/22/2015   TSH 1.600 09/14/2014   Lab Results  Component Value Date   BUN 12 06/04/2015   BUN 11 01/22/2015   BUN 10 09/14/2014   Lab Results  Component Value Date   HGBA1C 8.4* 06/04/2015   HGBA1C 7.5* 01/22/2015   HGBA1C 6.9* 09/14/2014    Assessment/Plan  1. Uncontrolled type 2 DM with peripheral circulatory disorder [facility]) Advised patient  that I thought his weight would fall and that his diabetic control would improve if he just switches Pepsi to diet Pepsi.  - Hemoglobin A1c; Future - Microalbumin, urine; Future - Comprehensive metabolic panel; Future  2. Hypothyroidism, unspecified hypothyroidism type - TSH; Future  3. Essential hypertension - Comprehensive metabolic panel; Future  4. Dyslipidemia - Lipid panel; Future  5. Other type of migraine - pentazocine-naloxone (TALWIN NX) 50-0.5 MG tablet; Take 1 tablet by mouth every 4 (four) hours as needed for pain.  Dispense: 90 tablet; Refill: 0  6. Chronic tension-type headache, not intractable - pentazocine-naloxone (TALWIN NX) 50-0.5 MG tablet; Take 1 tablet by mouth every 4 (four) hours as needed for pain.  Dispense: 90 tablet; Refill: 0     Patient was given a prescription for Tamiflu 75 mg take 1 daily to prevent flu encase his wife's testing for influenza returns positive.

## 2015-07-25 ENCOUNTER — Other Ambulatory Visit: Payer: Self-pay | Admitting: Internal Medicine

## 2015-08-12 ENCOUNTER — Encounter: Payer: Self-pay | Admitting: Nurse Practitioner

## 2015-08-12 ENCOUNTER — Ambulatory Visit (INDEPENDENT_AMBULATORY_CARE_PROVIDER_SITE_OTHER): Payer: Medicare Other | Admitting: Nurse Practitioner

## 2015-08-12 VITALS — BP 122/74 | HR 69 | Temp 97.9°F | Ht 70.0 in | Wt 209.0 lb

## 2015-08-12 DIAGNOSIS — F329 Major depressive disorder, single episode, unspecified: Secondary | ICD-10-CM

## 2015-08-12 DIAGNOSIS — L309 Dermatitis, unspecified: Secondary | ICD-10-CM | POA: Diagnosis not present

## 2015-08-12 DIAGNOSIS — J209 Acute bronchitis, unspecified: Secondary | ICD-10-CM | POA: Insufficient documentation

## 2015-08-12 DIAGNOSIS — I2583 Coronary atherosclerosis due to lipid rich plaque: Secondary | ICD-10-CM

## 2015-08-12 DIAGNOSIS — F419 Anxiety disorder, unspecified: Secondary | ICD-10-CM

## 2015-08-12 DIAGNOSIS — N5201 Erectile dysfunction due to arterial insufficiency: Secondary | ICD-10-CM | POA: Diagnosis not present

## 2015-08-12 DIAGNOSIS — E1151 Type 2 diabetes mellitus with diabetic peripheral angiopathy without gangrene: Secondary | ICD-10-CM | POA: Diagnosis not present

## 2015-08-12 DIAGNOSIS — I10 Essential (primary) hypertension: Secondary | ICD-10-CM

## 2015-08-12 DIAGNOSIS — E1121 Type 2 diabetes mellitus with diabetic nephropathy: Secondary | ICD-10-CM | POA: Diagnosis not present

## 2015-08-12 DIAGNOSIS — F32A Depression, unspecified: Secondary | ICD-10-CM

## 2015-08-12 DIAGNOSIS — I2510 Atherosclerotic heart disease of native coronary artery without angina pectoris: Secondary | ICD-10-CM | POA: Diagnosis not present

## 2015-08-12 DIAGNOSIS — E039 Hypothyroidism, unspecified: Secondary | ICD-10-CM | POA: Diagnosis not present

## 2015-08-12 MED ORDER — METFORMIN HCL 500 MG PO TABS: 500.0000 mg | ORAL_TABLET | Freq: Two times a day (BID) | ORAL | Status: DC

## 2015-08-12 MED ORDER — GLUCOSE BLOOD VI STRP: 1.0000 | ORAL_STRIP | Freq: Every morning | Status: DC

## 2015-08-12 MED ORDER — MOMETASONE FUROATE 0.1 % EX CREA: 1.0000 "application " | TOPICAL_CREAM | Freq: Every day | CUTANEOUS | Status: DC

## 2015-08-12 MED ORDER — ONETOUCH LANCETS MISC: Status: DC

## 2015-08-12 MED ORDER — AMOXICILLIN-POT CLAVULANATE 875-125 MG PO TABS: 1.0000 | ORAL_TABLET | Freq: Two times a day (BID) | ORAL | Status: AC

## 2015-08-12 NOTE — Assessment & Plan Note (Signed)
Sildenafil 100mg  x 6, 50mg  prn.

## 2015-08-12 NOTE — Assessment & Plan Note (Signed)
CBAG 3 years ago, continue ASA, Metoprolol 25mg  bid, NTG prn

## 2015-08-12 NOTE — Assessment & Plan Note (Addendum)
Controlled, continue Metoprolol 25mg  bid and Furosemide 33mf

## 2015-08-12 NOTE — Progress Notes (Signed)
Patient ID: Dean Cole, male   DOB: 06-Oct-1950, 65 y.o.   MRN: [id number]   Location:   [place] office    Place of Service:   [facility] office   Provider: Marlana Latus NP  Code Status: DNR Goals of Care:  Advanced Directives 08/12/2015  Does patient have an advance directive? No  Type of Advance Directive -  Copy of advanced directive(s) in chart? -  Would patient like information on creating an advanced directive? -  Pre-existing out of facility DNR order (yellow form or pink MOST form) -     Chief Complaint  Patient presents with  . Cough    productive cough for 2 to 3 days     HPI: Patient is a 65 y.o. male seen today for an acute visit for productive cough for 2-3 days, yellowish sputum, fever and chills last night, Tylenol helped, denied chest pain or SOB.    Hx of T2DM, last Hgb a1c 8.4 06/05/15, not taking meds. HTN, controlled on Metoprolol 25mg  bid and Furosemide 40mg . Depression/anxiety, managed with Valium 5mg  qid prn and Tofranil. CAD no recent chest pain. Last TSH 5.05 06/05/15, taking Levothyroxine 185mcg.    Past Medical History  Diagnosis Date  . HTN (hypertension)   . Hypothyroidism   . Borderline diabetes   . Coronary artery disease   . Angina   . Shortness of breath   . Cancer ([place])     skin cancer  basel cell carcinoma  . Headache(784.0)   . Restless leg syndrome   . Cholecystitis   . Blood transfusion     "no reaction to transfusions"  . Anxiety   . PAD (peripheral artery disease) ([place]) 09/20/2014  . Controlled type 2 DM with peripheral circulatory disorder ([place]) 09/20/2014    Past Surgical History  Procedure Laterality Date  . Cardiac catheterization  07/07/2011  . Coronary artery bypass graft  07/10/2011    Procedure: CORONARY ARTERY BYPASS GRAFTING (CABG);  Surgeon: Ivin Poot, MD;  Location: [facility];  Service: Open Heart Surgery;  Laterality: N/A;  . Excisional hemorrhoidectomy  1997  . Cholecystectomy  09/14/2011    Procedure: LAPAROSCOPIC  CHOLECYSTECTOMY WITH INTRAOPERATIVE CHOLANGIOGRAM;  Surgeon: Gwenyth Ober, MD;  Location: [facility];  Service: General;  Laterality: N/A;  . Hemorroidectomy  2000  . Basal cell carcinoma excision  2000    Dr,Arkin   . Left heart catheterization with coronary angiogram N/A 07/07/2011    Procedure: LEFT HEART CATHETERIZATION WITH CORONARY ANGIOGRAM;  Surgeon: Burnell Blanks, MD;  Location: [facility] CATH LAB;  Service: Cardiovascular;  Laterality: N/A;    Allergies  Allergen Reactions  . Eggs Or Egg-Derived Products Other (See Comments)    Bloating, nausea, and no rash   . Adhesive [Tape] Swelling      Medication List       This list is accurate as of: 08/12/15 11:59 PM.  Always use your most recent med list.               amoxicillin-clavulanate 875-125 MG tablet  Commonly known as:  AUGMENTIN  Take 1 tablet by mouth 2 (two) times daily.     aspirin 325 MG EC tablet  Take 1 tablet (325 mg total) by mouth daily.     [facility] FISH OIL 1200 MG Caps  One twice daily to lower triglycerides     diazepam 5 MG tablet  Commonly known as:  VALIUM  TAKE 1 TABLET 4 TIMES A DAY AS  NEEDED FOR NERVES     furosemide 40 MG tablet  Commonly known as:  LASIX  Take 40 mg by mouth daily as needed (edema).     glucose blood test strip  1 each by Other route every morning. Use as instructed     imipramine 25 MG tablet  Commonly known as:  TOFRANIL  Take by mouth. Take 3 tablets by mouth in the morning and 4 tablets by mouth at bedtime     levothyroxine 150 MCG tablet  Commonly known as:  SYNTHROID, LEVOTHROID  Take one tablet by mouth 30 minutes before breakfast once daily for thyroid     metFORMIN 500 MG tablet  Commonly known as:  GLUCOPHAGE  Take 1 tablet (500 mg total) by mouth 2 (two) times daily with a meal.     metoprolol tartrate 25 MG tablet  Commonly known as:  LOPRESSOR  Take one tablet by mouth twice daily     mometasone 0.1 % cream  Commonly known as:  ELOCON  Apply 1  application topically daily. For eczema     NITROSTAT 0.4 MG SL tablet  Generic drug:  nitroGLYCERIN  Place 0.4 mg under the tongue every 5 (five) minutes as needed. For chest pain     ONE TOUCH LANCETS Misc  Check blood sugar daily,     oseltamivir 75 MG capsule  Commonly known as:  TAMIFLU  One daily to prevent influenza     pentazocine-naloxone 50-0.5 MG tablet  Commonly known as:  TALWIN NX  Take 1 tablet by mouth every 4 (four) hours as needed for pain.     polyethylene glycol packet  Commonly known as:  MIRALAX / GLYCOLAX  Take 17 g by mouth daily as needed (constipation).     sildenafil 50 MG tablet  Commonly known as:  VIAGRA  Take 50 mg by mouth daily as needed for erectile dysfunction (erectile dysfunction).     simvastatin 20 MG tablet  Commonly known as:  ZOCOR  Take one tablet by mouth once daily for cholesterol        Review of Systems:  Review of Systems  Constitutional: Positive for fever, chills and fatigue. Negative for activity change, appetite change and unexpected weight change.  HENT: Positive for hearing loss. Negative for congestion, ear pain, rhinorrhea and tinnitus.   Eyes: Negative.   Respiratory: Positive for cough. Negative for apnea, choking, chest tightness, shortness of breath and wheezing.   Cardiovascular: Negative for chest pain, palpitations and leg swelling.  Gastrointestinal: Positive for constipation.  Endocrine: Negative for cold intolerance, heat intolerance, polydipsia, polyphagia and polyuria.       Diabetic. Hypothyroid on supplements.  Genitourinary:       Nocturia.  Musculoskeletal: Positive for arthralgias.  Skin:       Chronic dry skin.  Allergic/Immunologic: Negative.   Neurological:       Recurrent tension headaches.  Hematological: Negative.   Psychiatric/Behavioral:        Chronic anxiety. Some depressive symptoms. History of insomnia.    Health Maintenance  Topic Date Due  . Hepatitis C Screening  1950/06/01   . HIV Screening  02/11/1966  . COLONOSCOPY  05/04/2013  . OPHTHALMOLOGY EXAM  09/30/2015 (Originally 02/11/1961)  . ZOSTAVAX  05/01/2016 (Originally 02/12/2011)  . INFLUENZA VACCINE  02/14/2022 (Originally 11/30/2015)  . FOOT EXAM  09/16/2015  . HEMOGLOBIN A1C  12/02/2015  . URINE MICROALBUMIN  01/22/2016  . TETANUS/TDAP  06/26/2021    Physical Exam: Filed Vitals:  08/12/15 1008  BP: 122/74  Pulse: 69  Temp: 97.9 F (36.6 C)  TempSrc: Oral  Height: 5\' 10"  (1.778 m)  Weight: 209 lb (94.802 kg)  SpO2: 94%   Body mass index is 29.99 kg/(m^2). Physical Exam  Constitutional: He is oriented to person, place, and time. He appears well-developed. No distress.  HENT:  Head: Normocephalic and atraumatic.  Right Ear: External ear normal.  Left Ear: External ear normal.  Nose: Nose normal.  Mouth/Throat: Oropharynx is clear and moist.  Mild loss of hearing.  Eyes: Conjunctivae and EOM are normal. Pupils are equal, round, and reactive to light.  Funduscopic does not show microaneurysms or hemorrhages.  Neck:  Upper and lower dentures. Stud in the tongue.  Cardiovascular: Normal rate, regular rhythm, normal heart sounds and intact distal pulses.  Exam reveals no gallop and no friction rub.   No murmur heard. Pulmonary/Chest: No respiratory distress. He has no wheezes. He has no rales. He exhibits no tenderness.  Abdominal: He exhibits no distension and no mass. There is no tenderness.  Genitourinary: Prostate normal. Guaiac negative stool.  Musculoskeletal: Normal range of motion. He exhibits no edema or tenderness.  Neurological: He is alert and oriented to person, place, and time. He has normal reflexes. No cranial nerve deficit. Coordination normal.  09/16/2014 6CIT= 6  Skin: No rash noted. No erythema. No pallor.  Multiple tattoos. Multiple piercings. Shave/moles. 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 Polypoid growth of the left lower eyelid.  Psychiatric: He  has a normal mood and affect. His behavior is normal. Thought content normal.    Labs reviewed: Basic Metabolic Panel:  Recent Labs  09/14/14 0941 01/22/15 0915 06/04/15 0932  NA 143 139 139  K 4.6 4.2 4.1  CL 99 95* 96  CO2 26 26 26   GLUCOSE 140* 139* 126*  BUN 10 11 12   CREATININE 1.13 1.15 1.08  CALCIUM 9.6 9.5 9.3  TSH 1.600 3.430 5.050*   Liver Function Tests:  Recent Labs  09/14/14 0941 01/22/15 0915 06/04/15 0932  AST 39 33 28  ALT 30 26 24   ALKPHOS 138* 126* 134*  BILITOT 0.6 0.8 0.5  PROT 7.6 7.3 7.4  ALBUMIN 4.8 4.5 4.4   No results for input(s): LIPASE, AMYLASE in the last 8760 hours. No results for input(s): AMMONIA in the last 8760 hours. CBC: No results for input(s): WBC, NEUTROABS, HGB, HCT, MCV, PLT in the last 8760 hours. Lipid Panel:  Recent Labs  01/22/15 0915 06/04/15 0932  CHOL 172 162  HDL 31* 30*  LDLCALC 65 Comment  TRIG 381* 404*  CHOLHDL 5.5* 5.4*   Lab Results  Component Value Date   HGBA1C 8.4* 06/04/2015    Procedures since last visit: No results found.  Assessment/Plan Erectile dysfunction Sildenafil 100mg  x 6, 50mg  prn.   Controlled type 2 DM with peripheral circulatory disorder Last Hgb a1c 8.4, Metformin 500mg  bid, CBG ac breakfast daily, eye exam yearly.   Hypothyroidism Last TSH 5.05 06/04/15, continue Levothyroxine 180mcg daily, repeat TSH with next appointment  Acute bronchitis Congestion, productive cough 2-3 days, fever and chills last night, Tylenol helped, yellowish sputum, denied chest pain or SOB, will start Augmentin 875/125mg  q12h x 7 days, continue OTC Mucinex. Observe, may CXR if no better.   Coronary artery disease CBAG 3 years ago, continue ASA, Metoprolol 25mg  bid, NTG prn  Depression Mood is managed with Valium 5mg  qid prn  Anxiety Continue Valium 5mg  qid prn, continue Tofranil.  HTN (hypertension) Controlled, continue Metoprolol 25mg  bid and Furosemide 18mf  Eczema Prn Elocon    Type II diabetes mellitus with renal manifestations 06/05/15 Na 139, K 4.1, Bun 12, creat 1.08     Labs/tests ordered:  @ORDERS @ TSH at next appointment.  Next appt:  10/18/2015

## 2015-08-12 NOTE — Assessment & Plan Note (Signed)
Prn Elocon

## 2015-08-12 NOTE — Assessment & Plan Note (Signed)
Last Hgb a1c 8.4, Metformin 500mg  bid, CBG ac breakfast daily, eye exam yearly.

## 2015-08-12 NOTE — Assessment & Plan Note (Signed)
Last TSH 5.05 06/04/15, continue Levothyroxine 172mcg daily, repeat TSH with next appointment

## 2015-08-12 NOTE — Assessment & Plan Note (Signed)
06/05/15 Na 139, K 4.1, Bun 12, creat 1.08

## 2015-08-12 NOTE — Assessment & Plan Note (Addendum)
Continue Valium 5mg  qid prn, continue Tofranil.

## 2015-08-12 NOTE — Assessment & Plan Note (Signed)
Congestion, productive cough 2-3 days, fever and chills last night, Tylenol helped, yellowish sputum, denied chest pain or SOB, will start Augmentin 875/125mg  q12h x 7 days, continue OTC Mucinex. Observe, may CXR if no better.

## 2015-08-12 NOTE — Assessment & Plan Note (Signed)
Mood is managed with Valium 5mg  qid prn

## 2015-08-23 ENCOUNTER — Other Ambulatory Visit: Payer: Self-pay | Admitting: Internal Medicine

## 2015-08-24 ENCOUNTER — Other Ambulatory Visit: Payer: Self-pay

## 2015-08-24 MED ORDER — DIAZEPAM 5 MG PO TABS: ORAL_TABLET | ORAL | Status: DC

## 2015-08-24 NOTE — Telephone Encounter (Signed)
I had to re-print prescription because I used the wrong signing provider.

## 2015-09-01 ENCOUNTER — Encounter: Payer: Self-pay | Admitting: Internal Medicine

## 2015-09-01 ENCOUNTER — Encounter: Payer: Self-pay | Admitting: Cardiovascular Disease

## 2015-09-01 ENCOUNTER — Other Ambulatory Visit: Payer: Self-pay | Admitting: Internal Medicine

## 2015-09-15 ENCOUNTER — Encounter: Payer: Self-pay | Admitting: Cardiovascular Disease

## 2015-09-15 ENCOUNTER — Ambulatory Visit (INDEPENDENT_AMBULATORY_CARE_PROVIDER_SITE_OTHER): Payer: Medicare Other | Admitting: Cardiovascular Disease

## 2015-09-15 VITALS — BP 130/70 | HR 70 | Ht 70.0 in | Wt 201.1 lb

## 2015-09-15 DIAGNOSIS — I2510 Atherosclerotic heart disease of native coronary artery without angina pectoris: Secondary | ICD-10-CM | POA: Diagnosis not present

## 2015-09-15 DIAGNOSIS — I2583 Coronary atherosclerosis due to lipid rich plaque: Secondary | ICD-10-CM

## 2015-09-15 DIAGNOSIS — I10 Essential (primary) hypertension: Secondary | ICD-10-CM

## 2015-09-15 NOTE — Progress Notes (Signed)
Cardiology Office Note   Date:  09/15/2015   ID:  Dean Cole, DOB 26-Jun-1950, MRN [id number]  PCP:  Estill Dooms, MD  Cardiologist: Darlin Coco MD  Chief Complaint  Patient presents with  . Coronary Artery Disease      History of Present Illness: Dean Cole is a 65 y.o. male who presents for a six-month follow-up visit  This pleasant 65 year old gentleman is seen back for a scheduled followup office visit. He has a history of ischemic heart disease and underwent coronary artery bypass grafting x4 on 07/10/11 by Dr. Prescott Gum. He had severe multivessel coronary artery disease. Patient also has a history of high blood pressure, diabetes, hypothyroidism, and mild renal insufficiency. Since his heart surgery in March 2013 the patient was readmitted to the hospital 6 weeks later with acute cholecystitis and underwent cholecystectomy by Dr. Hulen Skains. Since then the patient has been feeling well except for problems with mild fluid retention. He has noted occasional ankle edema. He has to take a Lasix one to 3 times per month for mild edema. . Previously he had been chewing tobacco but he has not used tobacco since his bypass surgery. He does not drink any alcohol. The patient has hypercholesterolemia and is on and is on simvastatin 20 mg daily. Dr. Jeanmarie Hubert checks his labs. The patient has a past history of paroxysmal supraventricular tachycardia. The patient has had problems with erectile dysfunction. He was hoping that this would improve after bypass surgery but it has not. He is interested in possible use of Viagra. He does not take nitrates.  However he is not yet purchased the Viagra because of the expense.  The patient has not been experiencing any chest pain or angina. Exercise tolerance is stable  Sep 15, 2015  Pt is seen today - recent transfer from Dr. Mare Ferrari Hx of CAD / CABG, HTN,  No CP or dyspnea.  Some exercise.   Working in the garden  ( tomatoes ,  peppers )  Eats lots of starches - Trig levels are 400+ , DM - just started metformin     Past Medical History  Diagnosis Date  . HTN (hypertension)   . Hypothyroidism   . Borderline diabetes   . Coronary artery disease   . Angina   . Shortness of breath   . Cancer ([place])     skin cancer  basel cell carcinoma  . Headache(784.0)   . Restless leg syndrome   . Cholecystitis   . Blood transfusion     "no reaction to transfusions"  . Anxiety   . PAD (peripheral artery disease) ([place]) 09/20/2014  . 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) 09/20/2014    Past Surgical History  Procedure Laterality Date  . Cardiac catheterization  07/07/2011  . Coronary artery bypass graft  07/10/2011    Procedure: CORONARY ARTERY BYPASS GRAFTING (CABG);  Surgeon: Ivin Poot, MD;  Location: [facility];  Service: Open Heart Surgery;  Laterality: N/A;  . Excisional hemorrhoidectomy  1997  . Cholecystectomy  09/14/2011    Procedure: LAPAROSCOPIC CHOLECYSTECTOMY WITH INTRAOPERATIVE CHOLANGIOGRAM;  Surgeon: Gwenyth Ober, MD;  Location: [facility];  Service: General;  Laterality: N/A;  . Hemorroidectomy  2000  . Basal cell carcinoma excision  2000    Dr,Arkin   . Left heart catheterization with coronary angiogram N/A 07/07/2011    Procedure: LEFT HEART CATHETERIZATION WITH CORONARY ANGIOGRAM;  Surgeon: Burnell Blanks, MD;  Location:  [facility];  Service: Cardiovascular;  Laterality: N/A;     Current Outpatient Prescriptions  Medication Sig Dispense Refill  . aspirin EC 325 MG EC tablet Take 1 tablet (325 mg total) by mouth daily. 30 tablet   . diazepam (VALIUM) 5 MG tablet TAKE 1 TABLET 4 TIMES A DAY AS NEEDED FOR NERVES 120 tablet 0  . furosemide (LASIX) 40 MG tablet Take 40 mg by mouth daily as needed (edema).    Marland Kitchen glucose blood test strip 1 each by Other route every morning. Use as instructed 100 each 2  . imipramine (TOFRANIL) 25 MG tablet TAKE 3 TABLETS BY MOUTH EVERY MORNING  AND 4 TABLET AT BEDTIME 210 tablet 3  . levothyroxine (SYNTHROID, LEVOTHROID) 150 MCG tablet Take one tablet by mouth 30 minutes before breakfast once daily for thyroid 90 tablet 3  . metFORMIN (GLUCOPHAGE) 500 MG tablet Take 1 tablet (500 mg total) by mouth 2 (two) times daily with a meal. 60 tablet 2  . metoprolol tartrate (LOPRESSOR) 25 MG tablet Take one tablet by mouth twice daily 180 tablet 3  . mometasone (ELOCON) 0.1 % cream Apply 1 application topically daily. For eczema 45 g 3  . NITROSTAT 0.4 MG SL tablet Place 0.4 mg under the tongue every 5 (five) minutes as needed. For chest pain    . Omega-3 Fatty Acids ([employer] FISH OIL) 1200 MG CAPS One twice daily to lower triglycerides 60 capsule 0  . ONE TOUCH LANCETS MISC Check blood sugar daily, 200 each 2  . pentazocine-naloxone (TALWIN NX) 50-0.5 MG tablet Take 1 tablet by mouth every 4 (four) hours as needed for pain. 90 tablet 0  . polyethylene glycol (MIRALAX / GLYCOLAX) packet Take 17 g by mouth daily as needed (constipation).    . sildenafil (VIAGRA) 50 MG tablet Take 50 mg by mouth daily as needed for erectile dysfunction (erectile dysfunction).    . simvastatin (ZOCOR) 20 MG tablet Take one tablet by mouth once daily for cholesterol 90 tablet 3   No current facility-administered medications for this visit.    Allergies:   Eggs or egg-derived products and Adhesive    Social History:  The patient  reports that he quit smoking about 34 years ago. He has quit using smokeless tobacco. He reports that he does not drink alcohol or use illicit drugs.   Family History:  The patient's family history includes Coronary artery disease in his father and paternal grandfather; Suicidality in his brother.    ROS:  Please see the history of present illness.   Otherwise, review of systems are positive for none.   All other systems are reviewed and negative.    PHYSICAL EXAM: VS:  BP 130/70 mmHg  Pulse 70  Ht 5\' 10"  (1.778 m)  Wt 201 lb 1.9 oz  (91.227 kg)  BMI 28.86 kg/m2 , BMI Body mass index is 28.86 kg/(m^2). GEN: Well nourished, well developed, in no acute distress HEENT: normal Neck: no JVD, carotid bruits, or masses Cardiac: RRR; no murmurs, rubs, or gallops,no edema  Respiratory:  clear to auscultation bilaterally, normal work of breathing GI: soft, nontender, nondistended, + BS MS: no deformity or atrophy Skin: warm and dry, no rash Neuro:  Strength and sensation are intact Psych: euthymic mood, full affect   EKG:  EKG is ordered today. The ekg ordered today demonstrates normal sinus rhythm. voltage for LVH, maybe normal variant   Recent Labs: 06/04/2015: ALT 24; BUN 12; Creatinine, Ser 1.08; Potassium 4.1;  Sodium 139; TSH 5.050*    Lipid Panel    Component Value Date/Time   CHOL 162 06/04/2015 0932   CHOL 200 07/09/2011 0900   TRIG 404* 06/04/2015 0932   HDL 30* 06/04/2015 0932   HDL 31* 07/09/2011 0900   CHOLHDL 5.4* 06/04/2015 0932   CHOLHDL 6.5 07/09/2011 0900   VLDL 62* 07/09/2011 0900   LDLCALC Comment 06/04/2015 0932   LDLCALC 107* 07/09/2011 0900      Wt Readings from Last 3 Encounters:  09/15/15 201 lb 1.9 oz (91.227 kg)  08/12/15 209 lb (94.802 kg)  06/09/15 209 lb (94.802 kg)        ASSESSMENT AND PLAN:  1. ischemic heart disease status post CABG x4 on 07/10/11 by Dr. Prescott Gum No angia   2. Status post cholecystectomy 3. essential hypertension without heart failure - BP is well controlled  4. diabetes mellitus, adult onset 5. Hypothyroidism 6. mild renal insufficiency 7. Erectile dysfunction   Current medicines are reviewed at length with the patient today.  The patient does not have concerns regarding medicines.  The following changes have been made:  no change  Labs/ tests ordered today include:   No orders of the defined types were placed in this encounter.    Disposition: Continue on same medication.  Recheck in 6 months.  He needs to try to get more aerobic  exercise.  His wife states that he is very sedentary at home.

## 2015-09-15 NOTE — Patient Instructions (Signed)

## 2015-10-05 ENCOUNTER — Other Ambulatory Visit: Payer: Self-pay | Admitting: Internal Medicine

## 2015-10-05 ENCOUNTER — Other Ambulatory Visit: Payer: Self-pay

## 2015-10-05 MED ORDER — DIAZEPAM 5 MG PO TABS: ORAL_TABLET | ORAL | Status: DC

## 2015-10-05 NOTE — Telephone Encounter (Signed)
Reprint of prescription due to printer malfunction.

## 2015-10-13 ENCOUNTER — Ambulatory Visit: Payer: Medicare Other | Admitting: Internal Medicine

## 2015-10-18 ENCOUNTER — Other Ambulatory Visit: Payer: Medicare Other

## 2015-10-20 ENCOUNTER — Ambulatory Visit: Payer: Medicare Other | Admitting: Internal Medicine

## 2015-10-22 ENCOUNTER — Other Ambulatory Visit: Payer: Medicare Other

## 2015-10-22 DIAGNOSIS — E1151 Type 2 diabetes mellitus with diabetic peripheral angiopathy without gangrene: Secondary | ICD-10-CM | POA: Diagnosis not present

## 2015-10-22 DIAGNOSIS — E785 Hyperlipidemia, unspecified: Secondary | ICD-10-CM | POA: Diagnosis not present

## 2015-10-22 DIAGNOSIS — E039 Hypothyroidism, unspecified: Secondary | ICD-10-CM | POA: Diagnosis not present

## 2015-10-22 DIAGNOSIS — E1165 Type 2 diabetes mellitus with hyperglycemia: Secondary | ICD-10-CM | POA: Diagnosis not present

## 2015-10-22 DIAGNOSIS — I10 Essential (primary) hypertension: Secondary | ICD-10-CM

## 2015-10-22 DIAGNOSIS — IMO0002 Reserved for concepts with insufficient information to code with codable children: Secondary | ICD-10-CM

## 2015-10-23 LAB — LIPID PANEL
Chol/HDL Ratio: 4.3 ratio units (ref 0.0–5.0)
Cholesterol, Total: 128 mg/dL (ref 100–199)
HDL: 30 mg/dL — ABNORMAL LOW (ref >39)
LDL CALC: 48 mg/dL (ref 0–99)
Triglycerides: 250 mg/dL — ABNORMAL HIGH (ref 0–149)
VLDL CHOLESTEROL CAL: 50 mg/dL — AB (ref 5–40)

## 2015-10-23 LAB — COMPREHENSIVE METABOLIC PANEL
ALBUMIN: 4.3 g/dL (ref 3.6–4.8)
ALT: 14 IU/L (ref 0–44)
AST: 15 IU/L (ref 0–40)
Albumin/Globulin Ratio: 1.5 (ref 1.2–2.2)
Alkaline Phosphatase: 102 IU/L (ref 39–117)
BUN / CREAT RATIO: 14 (ref 10–24)
BUN: 14 mg/dL (ref 8–27)
Bilirubin Total: 0.4 mg/dL (ref 0.0–1.2)
CO2: 28 mmol/L (ref 18–29)
CREATININE: 1 mg/dL (ref 0.76–1.27)
Calcium: 9.2 mg/dL (ref 8.6–10.2)
Chloride: 102 mmol/L (ref 96–106)
GFR, EST AFRICAN AMERICAN: 92 mL/min/{1.73_m2} (ref >59)
GFR, EST NON AFRICAN AMERICAN: 79 mL/min/{1.73_m2} (ref >59)
GLUCOSE: 109 mg/dL — AB (ref 65–99)
Globulin, Total: 2.8 g/dL (ref 1.5–4.5)
Potassium: 4.5 mmol/L (ref 3.5–5.2)
Sodium: 145 mmol/L — ABNORMAL HIGH (ref 134–144)
TOTAL PROTEIN: 7.1 g/dL (ref 6.0–8.5)

## 2015-10-23 LAB — TSH: TSH: 0.27 u[IU]/mL — AB (ref 0.450–4.500)

## 2015-10-23 LAB — MICROALBUMIN, URINE: Microalbumin, Urine: 3.4 ug/mL

## 2015-10-23 LAB — HEMOGLOBIN A1C
Est. average glucose Bld gHb Est-mCnc: 134 mg/dL
Hgb A1c MFr Bld: 6.3 % — ABNORMAL HIGH (ref 4.8–5.6)

## 2015-10-26 ENCOUNTER — Ambulatory Visit (INDEPENDENT_AMBULATORY_CARE_PROVIDER_SITE_OTHER): Payer: Medicare Other | Admitting: Internal Medicine

## 2015-10-26 ENCOUNTER — Encounter: Payer: Self-pay | Admitting: Internal Medicine

## 2015-10-26 VITALS — BP 112/68 | HR 68 | Temp 97.9°F | Resp 20 | Ht 70.0 in | Wt 192.2 lb

## 2015-10-26 DIAGNOSIS — I10 Essential (primary) hypertension: Secondary | ICD-10-CM

## 2015-10-26 DIAGNOSIS — I2510 Atherosclerotic heart disease of native coronary artery without angina pectoris: Secondary | ICD-10-CM | POA: Diagnosis not present

## 2015-10-26 DIAGNOSIS — E039 Hypothyroidism, unspecified: Secondary | ICD-10-CM | POA: Diagnosis not present

## 2015-10-26 DIAGNOSIS — E785 Hyperlipidemia, unspecified: Secondary | ICD-10-CM

## 2015-10-26 DIAGNOSIS — F32A Depression, unspecified: Secondary | ICD-10-CM

## 2015-10-26 DIAGNOSIS — R300 Dysuria: Secondary | ICD-10-CM | POA: Insufficient documentation

## 2015-10-26 DIAGNOSIS — F329 Major depressive disorder, single episode, unspecified: Secondary | ICD-10-CM | POA: Diagnosis not present

## 2015-10-26 DIAGNOSIS — E1151 Type 2 diabetes mellitus with diabetic peripheral angiopathy without gangrene: Secondary | ICD-10-CM

## 2015-10-26 DIAGNOSIS — I2583 Coronary atherosclerosis due to lipid rich plaque: Secondary | ICD-10-CM

## 2015-10-26 MED ORDER — LEVOTHYROXINE SODIUM 100 MCG PO TABS: ORAL_TABLET | ORAL | Status: DC

## 2015-10-26 NOTE — Progress Notes (Signed)
Patient ID: Dean Cole, male   DOB: [birth year], 65 y.o.   MRN: [id number]    Facility  [facility]    Place of Service:   OFFICE    Allergies  Allergen Reactions  . Eggs Or Egg-Derived Products Other (See Comments)    Bloating, nausea, and no rash   . Adhesive [Tape] Swelling    Chief Complaint  Patient presents with  . Medical Management of Chronic Issues    4 mo f/u    HPI:  Dysuria. Having a slow stream. Denies increase in frequency.   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   DIAZEPAM (VALIUM) 5 MG TABLET    TAKE 1 TABLET BY MOUTH 4 TIMES A DAY AS NEEDED FOR NERVES   FUROSEMIDE (LASIX) 40 MG TABLET    Take 40 mg by mouth daily as needed (edema). Reported on 10/26/2015   GLUCOSE BLOOD TEST STRIP    1 each by Other route every morning. Use as instructed   IMIPRAMINE (TOFRANIL) 25 MG TABLET    TAKE 3 TABLETS BY MOUTH EVERY MORNING AND 4 TABLET AT BEDTIME   LEVOTHYROXINE (SYNTHROID, LEVOTHROID) 150 MCG TABLET    Take one tablet by mouth 30 minutes before breakfast once daily for thyroid   METFORMIN (GLUCOPHAGE) 500 MG TABLET    Take 1 tablet (500 mg total) by mouth 2 (two) times daily with a meal.   METOPROLOL TARTRATE (LOPRESSOR) 25 MG TABLET    Take one tablet by mouth twice daily   MOMETASONE (ELOCON) 0.1 % CREAM    Apply 1 application topically daily. For eczema   NITROSTAT 0.4 MG SL TABLET    Place 0.4 mg under the tongue every 5 (five) minutes as needed. Reported on 10/26/2015   OMEGA-3 FATTY ACIDS ([employer] FISH OIL) 1200 MG CAPS    One twice daily to lower triglycerides   ONE TOUCH LANCETS MISC    Check blood sugar  daily,   PENTAZOCINE-NALOXONE (TALWIN NX) 50-0.5 MG TABLET    Take 1 tablet by mouth every 4 (four) hours as needed for pain.   POLYETHYLENE GLYCOL (MIRALAX / GLYCOLAX) PACKET    Take 17 g by mouth daily as needed (constipation). Reported on 10/26/2015   SILDENAFIL (VIAGRA) 50 MG TABLET    Take 50 mg by mouth daily as needed for erectile dysfunction (erectile dysfunction). Reported on 10/26/2015   SIMVASTATIN (ZOCOR) 20 MG TABLET    Take one tablet by mouth once daily for cholesterol  Modified Medications   No medications on file  Discontinued Medications   No medications on file    Review of Systems  Constitutional: Positive for fatigue. Negative for fever, chills, activity change, appetite change and unexpected weight change.  HENT: Positive for hearing loss. Negative for congestion, ear pain, rhinorrhea and tinnitus.   Eyes: Negative.   Respiratory: Negative for apnea, cough, choking, chest tightness, shortness of breath and wheezing.   Cardiovascular: Negative for chest pain, palpitations and leg swelling.  Gastrointestinal: Positive for constipation.  Endocrine: Negative for cold intolerance, heat intolerance, polydipsia, polyphagia and polyuria.       Diabetic. Hypothyroid on supplements.  Genitourinary: Positive for dysuria.       Nocturia. Stranguria.  Musculoskeletal: Positive for arthralgias.  Skin:       Chronic dry skin.  Allergic/Immunologic: Negative.   Neurological:       Recurrent tension headaches.  Hematological: Negative.   Psychiatric/Behavioral:        Chronic anxiety. Some depressive symptoms. History of insomnia.    Filed Vitals:   10/26/15 1432  BP: 112/68  Pulse: 68  Temp: 97.9 F (36.6 C)  TempSrc: Oral  Resp: 20  Height: 5' 10"  (1.778 m)  Weight: 192 lb 3.2 oz (87.181 kg)  SpO2: 94%   Body mass index is 27.58 kg/(m^2). Filed Weights   10/26/15 1432  Weight: 192 lb 3.2 oz (87.181 kg)     Physical Exam  Constitutional: He is oriented to  person, place, and time. He appears well-developed. No distress.  HENT:  Head: Normocephalic and atraumatic.  Right Ear: External ear normal.  Left Ear: External ear normal.  Nose: Nose normal.  Mouth/Throat: Oropharynx is clear and moist.  Mild loss of hearing.  Eyes: Conjunctivae and EOM are normal. Pupils are equal, round, and reactive to light.  Funduscopic does not show microaneurysms or hemorrhages.  Neck:  Upper and lower dentures. Stud in the tongue.  Cardiovascular: Normal rate, regular rhythm, normal heart sounds and intact distal pulses.  Exam reveals no gallop and no friction rub.   No murmur heard. Pulmonary/Chest: No respiratory distress. He has no wheezes. He has no rales. He exhibits no tenderness.  Abdominal: He exhibits no distension and no mass. There is no tenderness.  Genitourinary: Prostate normal. Guaiac negative stool.  Musculoskeletal: Normal range of motion. He exhibits no edema or tenderness.  Neurological: He is alert and oriented to person, place, and time. He has normal reflexes. No cranial nerve deficit. Coordination normal.  09/16/2014 6CIT= 6  Skin: No rash noted. No erythema. No pallor.  Multiple tattoos. Multiple piercings. Shave/moles. 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 Polypoid growth of the left lower eyelid.  Psychiatric: He has a normal mood and affect. His behavior is normal. Thought content normal.    Labs reviewed: Lab Summary Latest Ref Rng 10/22/2015 06/04/2015 01/22/2015  Hemoglobin 13.0-17.0 g/dL (None) (None) (None)  Hematocrit 39.0-52.0 % (None) (None) (None)  White count - (None) (None) (None)  Platelet count - (None) (None) (None)  Sodium 134 - 144 mmol/L 145(H) 139 139  Potassium 3.5 - 5.2 mmol/L 4.5 4.1 4.2  Calcium 8.6 - 10.2 mg/dL 9.2 9.3 9.5  Phosphorus - (None) (None) (None)  Creatinine 0.76 - 1.27 mg/dL 1.00 1.08 1.15  AST 0 - 40 IU/L 15 28 33  Alk Phos 39 - 117 IU/L 102 134(H) 126(H)  Bilirubin 0.0 -  1.2 mg/dL 0.4 0.5 0.8  Glucose 65 - 99 mg/dL 109(H) 126(H) 139(H)  Cholesterol - (None) (None) (None)  HDL cholesterol >39 mg/dL 30(L) 30(L) 31(L)  Triglycerides 0 - 149 mg/dL 250(H) 404(H) 381(H)  LDL Direct - (None) (None) (None)  LDL Calc 0 - 99 mg/dL 48 Comment 65  Total protein - (None) (None) (None)  Albumin 3.6 - 4.8 g/dL 4.3 4.4 4.5   Lab Results  Component Value Date   TSH 0.270* 10/22/2015   TSH 5.050* 06/04/2015   TSH 3.430 01/22/2015   Lab Results  Component Value Date   BUN 14 10/22/2015   BUN 12 06/04/2015   BUN 11 01/22/2015   Lab Results  Component Value  Date   HGBA1C 6.3* 10/22/2015   HGBA1C 8.4* 06/04/2015   HGBA1C 7.5* 01/22/2015    Assessment/Plan 1. 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 Coronary artery disease due to lipid rich plaque stable  3. Depression improved  4. Dyslipidemia - Lipid panel; Future  5. Essential hypertension controlled  6. Hypothyroidism, unspecified hypothyroidism type - levothyroxine (SYNTHROID, LEVOTHROID) 100 MCG tablet; One daily for thyroid supplement  Dispense: 90 tablet; Refill: 3 - TSH; Future  7. Dysuria - Urinalysis - Urine culture

## 2015-10-28 LAB — URINALYSIS
BILIRUBIN UA: NEGATIVE
GLUCOSE, UA: NEGATIVE
Ketones, UA: NEGATIVE
LEUKOCYTES UA: NEGATIVE
Nitrite, UA: NEGATIVE
PROTEIN UA: NEGATIVE
RBC, UA: NEGATIVE
Specific Gravity, UA: 1.024 (ref 1.005–1.030)
Urobilinogen, Ur: 1 mg/dL (ref 0.2–1.0)
pH, UA: 5.5 (ref 5.0–7.5)

## 2015-10-28 LAB — URINE CULTURE: ORGANISM ID, BACTERIA: NO GROWTH

## 2015-11-11 ENCOUNTER — Other Ambulatory Visit: Payer: Self-pay | Admitting: Internal Medicine

## 2015-11-30 ENCOUNTER — Other Ambulatory Visit: Payer: Self-pay | Admitting: Internal Medicine

## 2015-11-30 DIAGNOSIS — I10 Essential (primary) hypertension: Secondary | ICD-10-CM

## 2015-12-14 ENCOUNTER — Other Ambulatory Visit: Payer: Self-pay | Admitting: Internal Medicine

## 2015-12-14 MED ORDER — DIAZEPAM 5 MG PO TABS: ORAL_TABLET | ORAL | 0 refills | Status: DC

## 2015-12-15 ENCOUNTER — Other Ambulatory Visit: Payer: Self-pay | Admitting: Internal Medicine

## 2015-12-22 NOTE — Addendum Note (Signed)
Addended by: Moshe Cipro MESHELL A on: 12/22/2015 02:35 PM   Modules accepted: Orders

## 2015-12-22 NOTE — Addendum Note (Signed)
Addended by: Moshe Cipro Raydon Chappuis A on: 12/22/2015 02:35 PM   Modules accepted: Orders

## 2016-01-24 ENCOUNTER — Other Ambulatory Visit: Payer: Self-pay | Admitting: Nurse Practitioner

## 2016-02-25 ENCOUNTER — Other Ambulatory Visit: Payer: Self-pay | Admitting: Nurse Practitioner

## 2016-02-25 ENCOUNTER — Other Ambulatory Visit: Payer: Medicare Other

## 2016-02-29 ENCOUNTER — Ambulatory Visit: Payer: Medicare Other | Admitting: Internal Medicine

## 2016-03-23 ENCOUNTER — Other Ambulatory Visit: Payer: Self-pay | Admitting: Internal Medicine

## 2016-04-12 ENCOUNTER — Other Ambulatory Visit: Payer: Self-pay

## 2016-04-17 ENCOUNTER — Other Ambulatory Visit: Payer: Medicare Other

## 2016-04-17 DIAGNOSIS — E785 Hyperlipidemia, unspecified: Secondary | ICD-10-CM | POA: Diagnosis not present

## 2016-04-17 DIAGNOSIS — E1151 Type 2 diabetes mellitus with diabetic peripheral angiopathy without gangrene: Secondary | ICD-10-CM | POA: Diagnosis not present

## 2016-04-17 DIAGNOSIS — E039 Hypothyroidism, unspecified: Secondary | ICD-10-CM

## 2016-04-18 LAB — COMPREHENSIVE METABOLIC PANEL
ALBUMIN: 4.6 g/dL (ref 3.6–5.1)
ALT: 13 U/L (ref 9–46)
AST: 16 U/L (ref 10–35)
Alkaline Phosphatase: 93 U/L (ref 40–115)
BILIRUBIN TOTAL: 0.6 mg/dL (ref 0.2–1.2)
BUN: 14 mg/dL (ref 7–25)
CHLORIDE: 100 mmol/L (ref 98–110)
CO2: 28 mmol/L (ref 20–31)
CREATININE: 1.05 mg/dL (ref 0.70–1.25)
Calcium: 9.4 mg/dL (ref 8.6–10.3)
Glucose, Bld: 140 mg/dL — ABNORMAL HIGH (ref 65–99)
Potassium: 4.3 mmol/L (ref 3.5–5.3)
SODIUM: 139 mmol/L (ref 135–146)
TOTAL PROTEIN: 7.8 g/dL (ref 6.1–8.1)

## 2016-04-18 LAB — LIPID PANEL
CHOLESTEROL: 166 mg/dL (ref <200)
HDL: 34 mg/dL — ABNORMAL LOW (ref >40)
LDL Cholesterol: 84 mg/dL (ref <100)
TRIGLYCERIDES: 242 mg/dL — AB (ref <150)
Total CHOL/HDL Ratio: 4.9 Ratio (ref <5.0)
VLDL: 48 mg/dL — ABNORMAL HIGH (ref <30)

## 2016-04-18 LAB — TSH: TSH: 4.6 mIU/L — ABNORMAL HIGH (ref 0.40–4.50)

## 2016-04-18 LAB — HEMOGLOBIN A1C
Hgb A1c MFr Bld: 6.6 % — ABNORMAL HIGH (ref <5.7)
MEAN PLASMA GLUCOSE: 143 mg/dL

## 2016-04-19 ENCOUNTER — Ambulatory Visit (INDEPENDENT_AMBULATORY_CARE_PROVIDER_SITE_OTHER): Payer: Medicare Other

## 2016-04-19 ENCOUNTER — Encounter: Payer: Self-pay | Admitting: Internal Medicine

## 2016-04-19 ENCOUNTER — Ambulatory Visit (INDEPENDENT_AMBULATORY_CARE_PROVIDER_SITE_OTHER): Payer: Medicare Other | Admitting: Internal Medicine

## 2016-04-19 VITALS — BP 170/80 | HR 82 | Temp 98.1°F | Ht 70.0 in | Wt 197.6 lb

## 2016-04-19 VITALS — BP 138/80 | HR 82 | Temp 98.1°F | Ht 70.0 in | Wt 194.0 lb

## 2016-04-19 DIAGNOSIS — Z0000 Encounter for general adult medical examination without abnormal findings: Secondary | ICD-10-CM

## 2016-04-19 DIAGNOSIS — I2583 Coronary atherosclerosis due to lipid rich plaque: Secondary | ICD-10-CM

## 2016-04-19 DIAGNOSIS — E785 Hyperlipidemia, unspecified: Secondary | ICD-10-CM

## 2016-04-19 DIAGNOSIS — Z23 Encounter for immunization: Secondary | ICD-10-CM | POA: Diagnosis not present

## 2016-04-19 DIAGNOSIS — I2510 Atherosclerotic heart disease of native coronary artery without angina pectoris: Secondary | ICD-10-CM

## 2016-04-19 DIAGNOSIS — G44209 Tension-type headache, unspecified, not intractable: Secondary | ICD-10-CM

## 2016-04-19 DIAGNOSIS — E039 Hypothyroidism, unspecified: Secondary | ICD-10-CM | POA: Diagnosis not present

## 2016-04-19 DIAGNOSIS — H9190 Unspecified hearing loss, unspecified ear: Secondary | ICD-10-CM | POA: Insufficient documentation

## 2016-04-19 DIAGNOSIS — N5201 Erectile dysfunction due to arterial insufficiency: Secondary | ICD-10-CM | POA: Diagnosis not present

## 2016-04-19 DIAGNOSIS — E1151 Type 2 diabetes mellitus with diabetic peripheral angiopathy without gangrene: Secondary | ICD-10-CM

## 2016-04-19 DIAGNOSIS — I10 Essential (primary) hypertension: Secondary | ICD-10-CM

## 2016-04-19 DIAGNOSIS — H9193 Unspecified hearing loss, bilateral: Secondary | ICD-10-CM

## 2016-04-19 MED ORDER — DIAZEPAM 5 MG PO TABS: ORAL_TABLET | ORAL | 0 refills | Status: DC

## 2016-04-19 MED ORDER — PENTAZOCINE-NALOXONE 50-0.5 MG PO TABS: 1.0000 | ORAL_TABLET | ORAL | 0 refills | Status: DC | PRN

## 2016-04-19 NOTE — Progress Notes (Signed)
Facility  [facility]    Place of Service:   OFFICE    Allergies  Allergen Reactions  . Eggs Or Egg-Derived Products Other (See Comments)    Bloating, nausea, and no rash   . Adhesive [Tape] Swelling    Chief Complaint  Patient presents with  . Medical Management of Chronic Issues    4 month medication management blood sugar, CAD, thyroid, blood pressure, depression, review labs.    HPI:   Controlled type 2 DM with peripheral circulatory disorder (HCC) - The current medical regimen is effective;  continue present plan and medications.  Coronary artery disease due to lipid rich plaque - no chest pain or palpitations  Essential hypertension - controlled. He was tense when first  BP was obtained today  Dyslipidemia - controlled  Hypothyroidism, unspecified type - compensated  Erectile dysfunction due to arterial insufficiency - inquiring about injections into the penis (alprostadil)  Multiple areas of scabs. He says he has been cutting off skin cancers with his knife.   Medications: Patient's Medications  New Prescriptions   No medications on file  Previous Medications   ASPIRIN EC 325 MG EC TABLET    Take 1 tablet (325 mg total) by mouth daily.   DIAZEPAM (VALIUM) 5 MG TABLET    TAKE 1 TABLET BY MOUTH 4 TIMES A DAY AS NEEDED FOR NERVES   FUROSEMIDE (LASIX) 40 MG TABLET    Take 40 mg by mouth daily as needed (edema). Reported on 10/26/2015   GLUCOSE BLOOD TEST STRIP    1 each by Other route every morning. Use as instructed   IMIPRAMINE (TOFRANIL) 25 MG TABLET    TAKE 3 TABLETS BY MOUTH EVERY MORNING AND 4 TABLET AT BEDTIME   LEVOTHYROXINE (SYNTHROID, LEVOTHROID) 100 MCG TABLET    One daily for thyroid supplement   METFORMIN (GLUCOPHAGE) 500 MG TABLET    Take 1 tablet (500 mg total) by mouth 2 (two) times daily with a meal.   METOPROLOL TARTRATE (LOPRESSOR) 25 MG TABLET    TAKE ONE TABLET BY MOUTH TWICE DAILY   MOMETASONE (ELOCON) 0.1 % CREAM    Apply 1 application topically  daily. For eczema   NITROSTAT 0.4 MG SL TABLET    Place 0.4 mg under the tongue every 5 (five) minutes as needed. Reported on 10/26/2015   OMEGA-3 FATTY ACIDS ([employer] FISH OIL) 1200 MG CAPS    One twice daily to lower triglycerides   ONE TOUCH LANCETS MISC    Check blood sugar daily,   PENTAZOCINE-NALOXONE (TALWIN NX) 50-0.5 MG TABLET    Take 1 tablet by mouth every 4 (four) hours as needed for pain.   POLYETHYLENE GLYCOL (MIRALAX / GLYCOLAX) PACKET    Take 17 g by mouth daily as needed (constipation). Reported on 10/26/2015   SIMVASTATIN (ZOCOR) 20 MG TABLET    TAKE ONE TABLET BY MOUTH ONCE DAILY FOR CHOLESTEROL  Modified Medications   No medications on file  Discontinued Medications   No medications on file    Review of Systems  Constitutional: Positive for fatigue. Negative for activity change, appetite change, chills, fever and unexpected weight change.  HENT: Positive for hearing loss. Negative for congestion, ear pain, rhinorrhea and tinnitus.   Eyes: Negative.   Respiratory: Negative for apnea, cough, choking, chest tightness, shortness of breath and wheezing.   Cardiovascular: Negative for chest pain, palpitations and leg swelling.  Gastrointestinal: Positive for constipation.  Endocrine: Negative for cold intolerance, heat intolerance, polydipsia, polyphagia  and polyuria.       Diabetic. Hypothyroid on supplements.  Genitourinary: Positive for dysuria.       Nocturia. Stranguria. Erectile dysfunction.  Musculoskeletal: Positive for arthralgias.  Skin:       Chronic dry skin.  Allergic/Immunologic: Negative.   Neurological:       Recurrent tension headaches.  Hematological: Negative.   Psychiatric/Behavioral:        Chronic anxiety. Some depressive symptoms. History of insomnia.    Vitals:   04/19/16 0940 04/19/16 0948  BP: (!) 170/80 138/80  Pulse: 82   Temp: 98.1 F (36.7 C)   TempSrc: Oral   SpO2: 99%   Weight: 194 lb (88 kg)   Height: 5' 10"  (1.778 m)    Body mass  index is 28.27 kg/m. Wt Readings from Last 3 Encounters:  04/19/16 194 lb (88 kg)  04/19/16 197 lb 9.6 oz (89.6 kg)  10/26/15 192 lb 3.2 oz (87.2 kg)      Physical Exam  Constitutional: He is oriented to person, place, and time. He appears well-developed. No distress.  HENT:  Head: Normocephalic and atraumatic.  Right Ear: External ear normal.  Left Ear: External ear normal.  Nose: Nose normal.  Mouth/Throat: Oropharynx is clear and moist.  Mild loss of hearing.  Eyes: Conjunctivae and EOM are normal. Pupils are equal, round, and reactive to light.  Funduscopic does not show microaneurysms or hemorrhages.  Neck:  Upper and lower dentures. Stud in the tongue.  Cardiovascular: Normal rate, regular rhythm, normal heart sounds and intact distal pulses.  Exam reveals no gallop and no friction rub.   No murmur heard. Pulmonary/Chest: No respiratory distress. He has no wheezes. He has no rales. He exhibits no tenderness.  Abdominal: He exhibits no distension and no mass. There is no tenderness.  Genitourinary: Prostate normal. Rectal exam shows guaiac negative stool.  Musculoskeletal: Normal range of motion. He exhibits no edema or tenderness.  Neurological: He is alert and oriented to person, place, and time. He has normal reflexes. No cranial nerve deficit. Coordination normal.  09/16/2014 6CIT= 6  Skin: No rash noted. No erythema. No pallor.  Multiple tattoos. Multiple piercings. Shave/moles. 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 Polypoid growth of the left lower eyelid. Multiple scabs where he has cut himself (removing skin cancers).  Psychiatric: He has a normal mood and affect. His behavior is normal. Thought content normal.    Labs reviewed: Lab Summary Latest Ref Rng & Units 04/17/2016 10/22/2015 06/04/2015  Hemoglobin 13.0-17.0 g/dL (None) (None) (None)  Hematocrit 39.0-52.0 % (None) (None) (None)  White count - (None) (None) (None)  Platelet count -  (None) (None) (None)  Sodium 135 - 146 mmol/L 139 145(H) 139  Potassium 3.5 - 5.3 mmol/L 4.3 4.5 4.1  Calcium 8.6 - 10.3 mg/dL 9.4 9.2 9.3  Phosphorus - (None) (None) (None)  Creatinine 0.70 - 1.25 mg/dL 1.05 1.00 1.08  AST 10 - 35 U/L 16 15 28   Alk Phos 40 - 115 U/L 93 102 134(H)  Bilirubin 0.2 - 1.2 mg/dL 0.6 0.4 0.5  Glucose 65 - 99 mg/dL 140(H) 109(H) 126(H)  Cholesterol <200 mg/dL 166 (None) (None)  HDL cholesterol >40 mg/dL 34(L) 30(L) 30(L)  Triglycerides <150 mg/dL 242(H) 250(H) 404(H)  LDL Direct - (None) (None) (None)  LDL Calc <100 mg/dL 84 48 Comment  Total protein 6.1 - 8.1 g/dL 7.8 (None) (None)  Albumin 3.6 - 5.1 g/dL 4.6 4.3 4.4  Some recent data  might be hidden   Lab Results  Component Value Date   TSH 4.60 (H) 04/17/2016   TSH 0.270 (L) 10/22/2015   TSH 5.050 (H) 06/04/2015   Lab Results  Component Value Date   BUN 14 04/17/2016   BUN 14 10/22/2015   BUN 12 06/04/2015   Lab Results  Component Value Date   HGBA1C 6.6 (H) 04/17/2016   HGBA1C 6.3 (H) 10/22/2015   HGBA1C 8.4 (H) 06/04/2015    Assessment/Plan  1. 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 Coronary artery disease due to lipid rich plaque The current medical regimen is effective;  continue present plan and medications.  3. Essential hypertension The current medical regimen is effective;  continue present plan and medications. - Basic metabolic panel; Future  4. Dyslipidemia controlled  5. Hypothyroidism, unspecified type - TSH; Future  6. Erectile dysfunction due to arterial insufficiency I advised hin that I have not prescribed this medication. I will get him a urology referral if he desires. He will instruction is the use of this medication. He is not sure.  7. Bilateral hearing loss, unspecified hearing loss type - Ambulatory referral to Audiology  8. Tension-type headache, not intractable,  unspecified chronicity pattern - pentazocine-naloxone (TALWIN NX) 50-0.5 MG tablet; Take 1 tablet by mouth every 4 (four) hours as needed for pain.  Dispense: 90 tablet; Refill: 0 - diazepam (VALIUM) 5 MG tablet; TAKE 1 TABLET BY MOUTH 4 TIMES A DAY AS NEEDED FOR NERVES  Dispense: 120 tablet; Refill: 0

## 2016-04-19 NOTE — Progress Notes (Signed)
Quick Notes   Health Maintenance:  Pn23  Given today; Due for foot exam, eye exam, colonoscopy and hearing screen. Pt also due for Hep C and HIV.  Abnormal Concerns:  None; MMSE-28/30 Passed clock test; Pt request referral for audiology in [place].     Patient Concerns:  Pt has concerns about big toes going numb; also, Viagra causes headaches, would like to discuss impotence issues.     Nurse Concerns:   BP 170/80  I have reviewed the information entered by the Health Advisor. I was present in the office during the time of patient interaction and was available for consultation. I agree with the documentation and advice.  Repeat BP was 138/80.  Viviann Spare Nyoka Cowden, MD

## 2016-04-19 NOTE — Progress Notes (Signed)
Subjective:   Dean Cole is a 65 y.o. male who presents for an Initial Medicare Annual Wellness Visit.  Review of Systems  Cardiac Risk Factors include: advanced age (>30men, >42 women);diabetes mellitus;family history of premature cardiovascular disease;hypertension;male gender;smoking/ tobacco exposure;sedentary lifestyle    Objective:    Today's Vitals   04/19/16 0850  BP: (!) 170/80  Pulse: 82  Temp: 98.1 F (36.7 C)  TempSrc: Oral  SpO2: 99%  Weight: 197 lb 9.6 oz (89.6 kg)  Height: 5\' 10"  ([id number] m)  PainSc: 0-No pain   Body mass index is 28.35 kg/m.  Current Medications (verified) Outpatient Encounter Prescriptions as of 04/19/2016  Medication Sig  . aspirin EC 325 MG EC tablet Take 1 tablet (325 mg total) by mouth daily.  . diazepam (VALIUM) 5 MG tablet TAKE 1 TABLET BY MOUTH 4 TIMES A DAY AS NEEDED FOR NERVES  . imipramine (TOFRANIL) 25 MG tablet TAKE 3 TABLETS BY MOUTH EVERY MORNING AND 4 TABLET AT BEDTIME  . levothyroxine (SYNTHROID, LEVOTHROID) 100 MCG tablet One daily for thyroid supplement  . metFORMIN (GLUCOPHAGE) 500 MG tablet Take 1 tablet (500 mg total) by mouth 2 (two) times daily with a meal.  . metoprolol tartrate (LOPRESSOR) 25 MG tablet TAKE ONE TABLET BY MOUTH TWICE DAILY  . mometasone (ELOCON) 0.1 % cream Apply 1 application topically daily. For eczema  . NITROSTAT 0.4 MG SL tablet Place 0.4 mg under the tongue every 5 (five) minutes as needed. Reported on 10/26/2015  . Omega-3 Fatty Acids ([employer] FISH OIL) 1200 MG CAPS One twice daily to lower triglycerides  . pentazocine-naloxone (TALWIN NX) 50-0.5 MG tablet Take 1 tablet by mouth every 4 (four) hours as needed for pain.  . polyethylene glycol (MIRALAX / GLYCOLAX) packet Take 17 g by mouth daily as needed (constipation). Reported on 10/26/2015  . simvastatin (ZOCOR) 20 MG tablet TAKE ONE TABLET BY MOUTH ONCE DAILY FOR CHOLESTEROL  . furosemide (LASIX) 40 MG tablet Take 40 mg by mouth daily as  needed (edema). Reported on 10/26/2015  . glucose blood test strip 1 each by Other route every morning. Use as instructed (Patient not taking: Reported on 04/19/2016)  . ONE TOUCH LANCETS MISC Check blood sugar daily, (Patient not taking: Reported on 04/19/2016)  . [DISCONTINUED] sildenafil (VIAGRA) 50 MG tablet Take 50 mg by mouth daily as needed for erectile dysfunction (erectile dysfunction). Reported on 10/26/2015   No facility-administered encounter medications on file as of 04/19/2016.     Allergies (verified) Eggs or egg-derived products and Adhesive [tape]   History: Past Medical History:  Diagnosis Date  . Angina   . Anxiety   . Blood transfusion    "no reaction to transfusions"  . Borderline diabetes   . Cancer ([place])    skin cancer  basel cell carcinoma  . Cholecystitis   . Controlled type 2 DM with peripheral circulatory disorder ([place]) 09/20/2014  . Coronary artery disease   . Headache(784.0)   . HTN (hypertension)   . Hypothyroidism   . PAD (peripheral artery disease) ([place]) 09/20/2014  . Restless leg syndrome   . Shortness of breath    Past Surgical History:  Procedure Laterality Date  . BASAL CELL CARCINOMA EXCISION  2000   Dr,Arkin   . CARDIAC CATHETERIZATION  07/07/2011  . CHOLECYSTECTOMY  09/14/2011   Procedure: LAPAROSCOPIC CHOLECYSTECTOMY WITH INTRAOPERATIVE CHOLANGIOGRAM;  Surgeon: Gwenyth Ober, MD;  Location: [facility];  Service: General;  Laterality: N/A;  . CORONARY ARTERY BYPASS  GRAFT  07/10/2011   Procedure: CORONARY ARTERY BYPASS GRAFTING (CABG);  Surgeon: Ivin Poot, MD;  Location: [facility];  Service: Open Heart Surgery;  Laterality: N/A;  . EXCISIONAL HEMORRHOIDECTOMY  1997  . HEMORROIDECTOMY  2000  . LEFT HEART CATHETERIZATION WITH CORONARY ANGIOGRAM N/A 07/07/2011   Procedure: LEFT HEART CATHETERIZATION WITH CORONARY ANGIOGRAM;  Surgeon: Burnell Blanks, MD;  Location: [facility] CATH LAB;  Service: Cardiovascular;  Laterality: N/A;   Family History    Problem Relation Age of Onset  . Coronary artery disease Father   . Suicidality Brother   . Coronary artery disease Paternal Grandfather    Social History   Occupational History  . Not on file.   Social History Main Topics  . Smoking status: Former Smoker    Quit date: 08/08/1981  . Smokeless tobacco: Former Systems developer  . Alcohol use No     Comment: former"  . Drug use: No  . Sexual activity: Yes   Tobacco Counseling Counseling given: No   Activities of Daily Living In your present state of health, do you have any difficulty performing the following activities: 04/19/2016  Hearing? Y  Vision? Y  Difficulty concentrating or making decisions? N  Walking or climbing stairs? N  Dressing or bathing? N  Doing errands, shopping? N  Preparing Food and eating ? N  Using the Toilet? N  In the past six months, have you accidently leaked urine? N  Do you have problems with loss of bowel control? N  Managing your Medications? N  Managing your Finances? N  Housekeeping or managing your Housekeeping? N  Some recent data might be hidden    Immunizations and Health Maintenance Immunization History  Administered Date(s) Administered  . Influenza-Unspecified 01/13/2009, 02/07/2012  . Pneumococcal Conjugate-13 01/27/2015  . Pneumococcal Polysaccharide-23 04/19/2016  . Tdap 06/27/2011   Health Maintenance Due  Topic Date Due  . Hepatitis C Screening  March 31, 1951  . OPHTHALMOLOGY EXAM  02/11/1961  . HIV Screening  02/11/1966  . COLONOSCOPY  05/04/2013  . FOOT EXAM  09/16/2015  . PNA vac Low Risk Adult (2 of 2 - PPSV23) 02/12/2016    Patient Care Team: Estill Dooms, MD as PCP - General (Internal Medicine)  Indicate any recent Medical Services you may have received from other than [facility] providers in the past year (date may be approximate).    Assessment:   This is a routine wellness examination for Dean Cole.  Hearing/Vision screen  Hearing Screening   125Hz  250Hz  500Hz  1000Hz   2000Hz  3000Hz  4000Hz  6000Hz  8000Hz   Right ear:   0 0 100  0    Left ear:   0 100 0  0    Comments: Pt has not had a hearing screen in over 10 yrs. Pt has noticed a decrease in hearing; requests a referral to audiology in [place].    Visual Acuity Screening   Right eye Left eye Both eyes  Without correction: 20/40 20/50 20/30-1  With correction:     Comments: Pt sees Dr. Marica Otter; due for an eye exam.    Dietary issues and exercise activities discussed: Current Exercise Habits: The patient does not participate in regular exercise at present  Goals    . Weight (lb) < 175 lb (79.4 kg)          Starting 04/19/16, I will attempt to decrease my weight to 175lb.       Depression Screen PHQ 2/9 Scores 04/19/2016 08/12/2015 09/16/2014 06/10/2014  PHQ -  2 Score 0 1 4 1   PHQ- 9 Score - - 6 -    Fall Risk Fall Risk  04/19/2016 08/12/2015 06/09/2015 01/27/2015 09/16/2014  Falls in the past year? No No No No No    Cognitive Function: MMSE - Mini Mental State Exam 04/19/2016 11/11/2013  Orientation to time 5 4  Orientation to Place 5 5  Registration 3 3  Attention/ Calculation 4 4  Recall 2 2  Language- name 2 objects 2 2  Language- repeat 1 1  Language- follow 3 step command 3 3  Language- read & follow direction 1 1  Write a sentence 1 1  Copy design 1 0  Total score 28 26        Screening Tests Health Maintenance  Topic Date Due  . Hepatitis C Screening  [birth year]  . OPHTHALMOLOGY EXAM  02/11/1961  . HIV Screening  02/11/1966  . COLONOSCOPY  05/04/2013  . FOOT EXAM  09/16/2015  . PNA vac Low Risk Adult (2 of 2 - PPSV23) 02/12/2016  . ZOSTAVAX  05/01/2016 (Originally 02/12/2011)  . INFLUENZA VACCINE  02/14/2022 (Originally 11/30/2015)  . HEMOGLOBIN A1C  10/16/2016  . URINE MICROALBUMIN  10/21/2016  . TETANUS/TDAP  06/26/2021        Plan:    I have personally reviewed and addressed the Medicare Annual Wellness questionnaire and have noted the following in the  patient's chart:  A. Medical and social history B. Use of alcohol, tobacco or illicit drugs  C. Current medications and supplements D. Functional ability and status E.  Nutritional status F.  Physical activity G. Advance directives H. List of other physicians I.  Hospitalizations, surgeries, and ER visits in previous 12 months J.  [place] to include hearing, vision, cognitive, depression L. Referrals and appointments - none  In addition, I have reviewed and discussed with patient certain preventive protocols, quality metrics, and best practice recommendations. A written personalized care plan for preventive services as well as general preventive health recommendations were provided to patient.  See attached scanned questionnaire for additional information.   Signed,   Allyn Kenner, LPN Health Advisor  I have reviewed the information entered by the Health Advisor. I was present in the office during the time of patient interaction and was available for consultation. I agree with the documentation and advice.  Viviann Spare Nyoka Cowden, MD

## 2016-04-19 NOTE — Patient Instructions (Addendum)
Dean Cole , Thank you for taking time to come for your Medicare Wellness Visit. I appreciate your ongoing commitment to your health goals. Please review the following plan we discussed and let me know if I can assist you in the future.   These are the goals we discussed: Goals    . Weight (lb) < 175 lb (79.4 kg)          Starting 04/19/16, I will attempt to decrease my weight to 175lb.        This is a list of the screening recommended for you and due dates:  Health Maintenance  Topic Date Due  .  Hepatitis C: One time screening is recommended by Center for Disease Control  (CDC) for  adults born from [birth year] through 1965.   Oct 07, 1950  . Eye exam for diabetics  02/11/1961  . HIV Screening  02/11/1966  . Complete foot exam   09/16/2015  . Shingles Vaccine  05/01/2016*  . Colon Cancer Screening  04/30/2017*  . Pneumonia vaccines (2 of 2 - PPSV23) 04/30/2017*  . Hemoglobin A1C  10/16/2016  . Urine Protein Check  10/21/2016  . Tetanus Vaccine  06/26/2021  . Flu Shot  Excluded  *Topic was postponed. The date shown is not the original due date.  Preventive Care for Adults  A healthy lifestyle and preventive care can promote health and wellness. Preventive health guidelines for adults include the following key practices.  . A routine yearly physical is a good way to check with your health care provider about your health and preventive screening. It is a chance to share any concerns and updates on your health and to receive a thorough exam.  . Visit your dentist for a routine exam and preventive care every 6 months. Brush your teeth twice a day and floss once a day. Good oral hygiene prevents tooth decay and gum disease.  . The frequency of eye exams is based on your age, health, family medical history, use  of contact lenses, and other factors. Follow your health care provider's ecommendations for frequency of eye exams.  . Eat a healthy diet. Foods like vegetables, fruits, whole  grains, low-fat dairy products, and lean protein foods contain the nutrients you need without too many calories. Decrease your intake of foods high in solid fats, added sugars, and salt. Eat the right amount of calories for you. Get information about a proper diet from your health care provider, if necessary.  . Regular physical exercise is one of the most important things you can do for your health. Most adults should get at least 150 minutes of moderate-intensity exercise (any activity that increases your heart rate and causes you to sweat) each week. In addition, most adults need muscle-strengthening exercises on 2 or more days a week.  Silver Sneakers may be a benefit available to you. To determine eligibility, you may visit the website: www.silversneakers.com or contact program at (581) 492-6839 Mon-Fri between 8AM-8PM.   . Maintain a healthy weight. The body mass index (BMI) is a screening tool to identify possible weight problems. It provides an estimate of body fat based on height and weight. Your health care provider can find your BMI and can help you achieve or maintain a healthy weight.   For adults 20 years and older: ? A BMI below 18.5 is considered underweight. ? A BMI of 18.5 to 24.9 is normal. ? A BMI of 25 to 29.9 is considered overweight. ? A BMI of 30 and above  is considered obese.   . Maintain normal blood lipids and cholesterol levels by exercising and minimizing your intake of saturated fat. Eat a balanced diet with plenty of fruit and vegetables. Blood tests for lipids and cholesterol should begin at age 4 and be repeated every 5 years. If your lipid or cholesterol levels are high, you are over 50, or you are at high risk for heart disease, you may need your cholesterol levels checked more frequently. Ongoing high lipid and cholesterol levels should be treated with medicines if diet and exercise are not working.  . If you smoke, find out from your health care provider how to  quit. If you do not use tobacco, please do not start.  . If you choose to drink alcohol, please do not consume more than 2 drinks per day. One drink is considered to be 12 ounces (355 mL) of beer, 5 ounces (148 mL) of wine, or 1.5 ounces (44 mL) of liquor.  . If you are 15-25 years old, ask your health care provider if you should take aspirin to prevent strokes.  . Use sunscreen. Apply sunscreen liberally and repeatedly throughout the day. You should seek shade when your shadow is shorter than you. Protect yourself by wearing long sleeves, pants, a wide-brimmed hat, and sunglasses year round, whenever you are outdoors.  . Once a month, do a whole body skin exam, using a mirror to look at the skin on your back. Tell your health care provider of new moles, moles that have irregular borders, moles that are larger than a pencil eraser, or moles that have changed in shape or color.

## 2016-05-31 ENCOUNTER — Emergency Department (HOSPITAL_BASED_OUTPATIENT_CLINIC_OR_DEPARTMENT_OTHER)
Admission: EM | Admit: 2016-05-31 | Discharge: 2016-05-31 | Disposition: A | Discharge status: Home / Self Care | Payer: Medicare Other | Attending: Emergency Medicine | Admitting: Emergency Medicine

## 2016-05-31 ENCOUNTER — Emergency Department (HOSPITAL_BASED_OUTPATIENT_CLINIC_OR_DEPARTMENT_OTHER): Payer: Medicare Other

## 2016-05-31 ENCOUNTER — Encounter (HOSPITAL_BASED_OUTPATIENT_CLINIC_OR_DEPARTMENT_OTHER): Payer: Self-pay

## 2016-05-31 DIAGNOSIS — R0981 Nasal congestion: Secondary | ICD-10-CM | POA: Diagnosis not present

## 2016-05-31 DIAGNOSIS — R61 Generalized hyperhidrosis: Secondary | ICD-10-CM | POA: Insufficient documentation

## 2016-05-31 DIAGNOSIS — Z7984 Long term (current) use of oral hypoglycemic drugs: Secondary | ICD-10-CM | POA: Diagnosis not present

## 2016-05-31 DIAGNOSIS — E119 Type 2 diabetes mellitus without complications: Secondary | ICD-10-CM | POA: Diagnosis not present

## 2016-05-31 DIAGNOSIS — I2510 Atherosclerotic heart disease of native coronary artery without angina pectoris: Secondary | ICD-10-CM | POA: Insufficient documentation

## 2016-05-31 DIAGNOSIS — R410 Disorientation, unspecified: Secondary | ICD-10-CM | POA: Diagnosis not present

## 2016-05-31 DIAGNOSIS — R531 Weakness: Secondary | ICD-10-CM | POA: Insufficient documentation

## 2016-05-31 DIAGNOSIS — Z85820 Personal history of malignant melanoma of skin: Secondary | ICD-10-CM | POA: Insufficient documentation

## 2016-05-31 DIAGNOSIS — Z87891 Personal history of nicotine dependence: Secondary | ICD-10-CM | POA: Insufficient documentation

## 2016-05-31 DIAGNOSIS — R110 Nausea: Secondary | ICD-10-CM | POA: Insufficient documentation

## 2016-05-31 DIAGNOSIS — M549 Dorsalgia, unspecified: Secondary | ICD-10-CM | POA: Diagnosis not present

## 2016-05-31 DIAGNOSIS — J029 Acute pharyngitis, unspecified: Secondary | ICD-10-CM | POA: Diagnosis not present

## 2016-05-31 DIAGNOSIS — R05 Cough: Secondary | ICD-10-CM | POA: Diagnosis not present

## 2016-05-31 DIAGNOSIS — R059 Cough, unspecified: Secondary | ICD-10-CM

## 2016-05-31 DIAGNOSIS — R5383 Other fatigue: Secondary | ICD-10-CM | POA: Insufficient documentation

## 2016-05-31 DIAGNOSIS — Z7982 Long term (current) use of aspirin: Secondary | ICD-10-CM | POA: Insufficient documentation

## 2016-05-31 DIAGNOSIS — I10 Essential (primary) hypertension: Secondary | ICD-10-CM | POA: Insufficient documentation

## 2016-05-31 DIAGNOSIS — R300 Dysuria: Secondary | ICD-10-CM | POA: Diagnosis not present

## 2016-05-31 DIAGNOSIS — E039 Hypothyroidism, unspecified: Secondary | ICD-10-CM | POA: Diagnosis not present

## 2016-05-31 DIAGNOSIS — R509 Fever, unspecified: Secondary | ICD-10-CM | POA: Diagnosis not present

## 2016-05-31 DIAGNOSIS — K5900 Constipation, unspecified: Secondary | ICD-10-CM | POA: Insufficient documentation

## 2016-05-31 DIAGNOSIS — M6281 Muscle weakness (generalized): Secondary | ICD-10-CM | POA: Diagnosis not present

## 2016-05-31 LAB — CBC WITH DIFFERENTIAL/PLATELET
BASOS ABS: 0 10*3/uL (ref 0.0–0.1)
BASOS PCT: 0 %
EOS ABS: 0.2 10*3/uL (ref 0.0–0.7)
Eosinophils Relative: 2 %
HEMATOCRIT: 46.7 % (ref 39.0–52.0)
HEMOGLOBIN: 15.9 g/dL (ref 13.0–17.0)
Lymphocytes Relative: 42 %
Lymphs Abs: 4.3 10*3/uL — ABNORMAL HIGH (ref 0.7–4.0)
MCH: 30.5 pg (ref 26.0–34.0)
MCHC: 34 g/dL (ref 30.0–36.0)
MCV: 89.6 fL (ref 78.0–100.0)
MONOS PCT: 6 %
Monocytes Absolute: 0.6 10*3/uL (ref 0.1–1.0)
NEUTROS ABS: 5.2 10*3/uL (ref 1.7–7.7)
NEUTROS PCT: 50 %
Platelets: 126 10*3/uL — ABNORMAL LOW (ref 150–400)
RBC: 5.21 MIL/uL (ref 4.22–5.81)
RDW: 13.7 % (ref 11.5–15.5)
WBC: 10.4 10*3/uL (ref 4.0–10.5)

## 2016-05-31 LAB — BASIC METABOLIC PANEL
ANION GAP: 9 (ref 5–15)
BUN: 22 mg/dL — ABNORMAL HIGH (ref 6–20)
CHLORIDE: 96 mmol/L — AB (ref 101–111)
CO2: 32 mmol/L (ref 22–32)
CREATININE: 1.27 mg/dL — AB (ref 0.61–1.24)
Calcium: 9.3 mg/dL (ref 8.9–10.3)
GFR calc non Af Amer: 58 mL/min — ABNORMAL LOW (ref >60)
Glucose, Bld: 213 mg/dL — ABNORMAL HIGH (ref 65–99)
Potassium: 4.4 mmol/L (ref 3.5–5.1)
SODIUM: 137 mmol/L (ref 135–145)

## 2016-05-31 LAB — URINALYSIS, ROUTINE W REFLEX MICROSCOPIC
BILIRUBIN URINE: NEGATIVE
GLUCOSE, UA: NEGATIVE mg/dL
HGB URINE DIPSTICK: NEGATIVE
Ketones, ur: NEGATIVE mg/dL
Leukocytes, UA: NEGATIVE
Nitrite: NEGATIVE
PH: 6 (ref 5.0–8.0)
Protein, ur: NEGATIVE mg/dL
Specific Gravity, Urine: 1.024 (ref 1.005–1.030)

## 2016-05-31 NOTE — ED Triage Notes (Signed)
URI with cough, subjective fever x 2 week. Pt c/o dysuria and low back pain. The urinary symptoms is the primary reason for visit today.

## 2016-05-31 NOTE — ED Provider Notes (Signed)
[facility] DEPT [facility] Provider Note   CSN: [id number] Arrival date & time: 05/31/16  1737  By signing my name below, I, Dean Cole, attest that this documentation has been prepared under the direction and in the presence of [name], PA-C. Electronically Signed: Gwenlyn Cole, ED Scribe. 05/31/16. 7:39 PM.   History   Chief Complaint Chief Complaint  Patient presents with  . Dysuria  . URI   The history is provided by the patient. No language interpreter was used.   HPI Comments: Dean Cole is a 66 y.o. male with PMHx of skin cancer, HTN, CAD and borderline diabetes, hypothyroidism -- who presents to the Emergency Department complaining of gradual onset, intermittent fatigue for a few days. Two weeks ago, pt was experiencing sore throat, rhinorrhea, fever, nasal and chest congestion, chills, diaphoresis, nausea and cough. He took Chloraseptic with relief to all symptoms except diaphoresis and chills. Pt reports associated lower back pain that began after initial symptoms. He states back pain is worse on the left side. He has had dysuria ongoing for about a year. Pt states he has a burning sensation towards the end of his urethra when he urinates. He takes MiraLAX for baseline constipation and bloating. Pt denies abdominal pain, vomiting, chest pain, shortness of breath, and rectal pain with bowel movements.  Outside of the room, pt's wife states pt has not been as coherent recently, especially upon waking in the mornings.  Past Medical History:  Diagnosis Date  . Angina   . Anxiety   . Blood transfusion    "no reaction to transfusions"  . Borderline diabetes   . Cancer ([place])    skin cancer  basel cell carcinoma  . Cholecystitis   . Controlled type 2 DM with peripheral circulatory disorder ([place]) 09/20/2014  . Coronary artery disease   . Headache(784.0)   . HTN (hypertension)   . Hypothyroidism   . PAD (peripheral artery disease) ([place]) 09/20/2014  . Restless leg  syndrome   . Shortness of breath     Patient Active Problem List   Diagnosis Date Noted  . Hearing loss 04/19/2016  . Dysuria 10/26/2015  . PAD (peripheral artery disease) ([place]) 09/20/2014  . Controlled type 2 DM with peripheral circulatory disorder ([place]) 09/20/2014  . Depression 09/16/2014  . Erectile dysfunction 07/02/2014  . Type II diabetes mellitus with renal manifestations ([place]) 03/04/2014  . Eczema 12/25/2012  . Anxiety   . Dyslipidemia 02/29/2012  . PSVT (paroxysmal supraventricular tachycardia) ([place]) 07/14/2011  . HTN (hypertension)   . Hypothyroidism   . Coronary artery disease   . Basal cell carcinoma   . Headache   . Restless leg syndrome     Past Surgical History:  Procedure Laterality Date  . BASAL CELL CARCINOMA EXCISION  2000   Dr,Arkin   . CARDIAC CATHETERIZATION  07/07/2011  . CHOLECYSTECTOMY  09/14/2011   Procedure: LAPAROSCOPIC CHOLECYSTECTOMY WITH INTRAOPERATIVE CHOLANGIOGRAM;  Surgeon: Gwenyth Ober, MD;  Location: [facility];  Service: General;  Laterality: N/A;  . CORONARY ARTERY BYPASS GRAFT  07/10/2011   Procedure: CORONARY ARTERY BYPASS GRAFTING (CABG);  Surgeon: Ivin Poot, MD;  Location: [facility];  Service: Open Heart Surgery;  Laterality: N/A;  . EXCISIONAL HEMORRHOIDECTOMY  1997  . HEMORROIDECTOMY  2000  . LEFT HEART CATHETERIZATION WITH CORONARY ANGIOGRAM N/A 07/07/2011   Procedure: LEFT HEART CATHETERIZATION WITH CORONARY ANGIOGRAM;  Surgeon: Burnell Blanks, MD;  Location: [facility] CATH LAB;  Service: Cardiovascular;  Laterality: N/A;  Home Medications    Prior to Admission medications   Medication Sig Start Date End Date Taking? Authorizing Provider  aspirin EC 325 MG EC tablet Take 1 tablet (325 mg total) by mouth daily. 07/14/11   Wayne E Gold, PA-C  diazepam (VALIUM) 5 MG tablet TAKE 1 TABLET BY MOUTH 4 TIMES A DAY AS NEEDED FOR NERVES 04/19/16   Estill Dooms, MD  furosemide (LASIX) 40 MG tablet Take 40 mg by mouth daily as needed  (edema). Reported on 10/26/2015    Historical Provider, MD  glucose blood test strip 1 each by Other route every morning. Use as instructed 08/12/15   Man X Mast, NP  imipramine (TOFRANIL) 25 MG tablet TAKE 3 TABLETS BY MOUTH EVERY MORNING AND 4 TABLET AT BEDTIME 08/24/15   Estill Dooms, MD  levothyroxine (SYNTHROID, LEVOTHROID) 100 MCG tablet One daily for thyroid supplement 10/26/15   Estill Dooms, MD  metFORMIN (GLUCOPHAGE) 500 MG tablet Take 1 tablet (500 mg total) by mouth 2 (two) times daily with a meal. 08/12/15   Man X Mast, NP  metoprolol tartrate (LOPRESSOR) 25 MG tablet TAKE ONE TABLET BY MOUTH TWICE DAILY 11/30/15   Estill Dooms, MD  mometasone (ELOCON) 0.1 % cream Apply 1 application topically daily. For eczema 08/12/15   Man X Mast, NP  NITROSTAT 0.4 MG SL tablet Place 0.4 mg under the tongue every 5 (five) minutes as needed. Reported on 10/26/2015 07/06/11   Historical Provider, MD  Omega-3 Fatty Acids ([facility] FISH OIL) 1200 MG CAPS One twice daily to lower triglycerides 06/09/15   Estill Dooms, MD  ONE [facility] LANCETS MISC Check blood sugar daily, 08/12/15   Man X Mast, NP  pentazocine-naloxone (TALWIN NX) 50-0.5 MG tablet Take 1 tablet by mouth every 4 (four) hours as needed for pain. 04/19/16   Estill Dooms, MD  polyethylene glycol [facility] / Floria Raveling) packet Take 17 g by mouth daily as needed (constipation). Reported on 10/26/2015    Historical Provider, MD  simvastatin (ZOCOR) 20 MG tablet TAKE ONE TABLET BY MOUTH ONCE DAILY FOR CHOLESTEROL 12/15/15   Estill Dooms, MD    Family History Family History  Problem Relation Age of Onset  . Coronary artery disease Father   . Suicidality Brother   . Coronary artery disease Paternal Grandfather     Social History Social History  Substance Use Topics  . Smoking status: Former Smoker    Quit date: 08/08/1981  . Smokeless tobacco: Former Systems developer  . Alcohol use No     Comment: former"     Allergies   Eggs or egg-derived products and  Adhesive [tape]   Review of Systems Review of Systems  Constitutional: Positive for chills, diaphoresis, fatigue and fever.  HENT: Positive for congestion, rhinorrhea and sore throat.   Eyes: Negative for redness.  Respiratory: Positive for cough. Negative for shortness of breath.   Cardiovascular: Negative for chest pain.  Gastrointestinal: Positive for constipation and nausea. Negative for abdominal pain, blood in stool, diarrhea, rectal pain and vomiting.  Genitourinary: Positive for dysuria.  Musculoskeletal: Positive for back pain. Negative for myalgias.  Skin: Negative for rash.  Neurological: Negative for headaches.  Psychiatric/Behavioral: Positive for confusion.     Physical Exam Updated Vital Signs BP (!) 137/101 (BP Location: Right Arm)   Pulse 99   Temp 97.6 F (36.4 C) (Oral)   Resp 18   SpO2 99%   Physical Exam  Constitutional: He is oriented to  person, place, and time. He appears well-developed and well-nourished.  HENT:  Head: Normocephalic and atraumatic.  Right Ear: Tympanic membrane, external ear and ear canal normal.  Left Ear: Tympanic membrane, external ear and ear canal normal.  Nose: Nose normal.  Mouth/Throat: Uvula is midline, oropharynx is clear and moist and mucous membranes are normal.  Eyes: Conjunctivae, EOM and lids are normal. Pupils are equal, round, and reactive to light. Right eye exhibits no discharge. Left eye exhibits no discharge.  No conjunctival pallor  Neck: Normal range of motion. Neck supple.  Cardiovascular: Normal rate, regular rhythm and normal heart sounds.   No murmur heard. Pulmonary/Chest: Effort normal and breath sounds normal. No respiratory distress. He has no wheezes. He has no rales.  Abdominal: Soft. He exhibits no mass. There is no tenderness. There is no guarding.  Musculoskeletal: Normal range of motion.       Cervical back: He exhibits normal range of motion, no tenderness and no bony tenderness.       Thoracic  back: He exhibits normal range of motion, no tenderness and no bony tenderness.       Lumbar back: He exhibits tenderness (bilateral paraspinous). He exhibits normal range of motion and no bony tenderness.  Neurological: He is alert and oriented to person, place, and time. He has normal strength and normal reflexes. No cranial nerve deficit or sensory deficit. He exhibits normal muscle tone. He displays a negative Romberg sign. Coordination and gait normal. GCS eye subscore is 4. GCS verbal subscore is 5. GCS motor subscore is 6.  Skin: Skin is warm and dry.  Psychiatric: He has a normal mood and affect.  Nursing note and vitals reviewed.    ED Treatments / Results  DIAGNOSTIC STUDIES: Oxygen Saturation is 99% on RA, normal by my interpretation.    COORDINATION OF CARE: 7:14 PM Discussed treatment plan with pt at bedside which includes Chest XR, CBC with Differential and BMP and pt agreed to plan.  Labs (all labs ordered are listed, but only abnormal results are displayed) Labs Reviewed  URINALYSIS, ROUTINE W REFLEX MICROSCOPIC - Abnormal; Notable for the following:       Result Value   Color, Urine AMBER (*)    All other components within normal limits  CBC WITH DIFFERENTIAL/PLATELET - Abnormal; Notable for the following:    Platelets 126 (*)    Lymphs Abs 4.3 (*)    All other components within normal limits  BASIC METABOLIC PANEL - Abnormal; Notable for the following:    Chloride 96 (*)    Glucose, Bld 213 (*)    BUN 22 (*)    Creatinine, Ser 1.27 (*)    GFR calc non Af Amer 58 (*)    All other components within normal limits    Radiology Dg Chest 2 View  Result Date: 05/31/2016 CLINICAL DATA:  Worsening fatigue over the last several days. EXAM: CHEST  2 VIEW COMPARISON:  08/09/2011 FINDINGS: Previous median sternotomy and CABG. Heart size is normal. Linear scars in both lower lungs. No evidence of active infiltrate, mass, effusion or collapse. No acute bone finding.  IMPRESSION: Previous CABG. Linear scarring both lower lungs. No active process. Electronically Signed   By: Nelson Chimes M.D.   On: 05/31/2016 20:54    Procedures Procedures (including critical care time)  Medications Ordered in ED Medications - No data to display   Initial Impression / Assessment and Plan / ED Course  I have reviewed the triage vital  signs and the nursing notes.  Pertinent labs & imaging results that were available during my care of the patient were reviewed by me and considered in my medical decision making (see chart for details).     Patient seen and examined. Work-up initiated.    Vital signs reviewed and are as follows: BP 179/90 (BP Location: Right Arm)   Pulse 74   Temp 97.8 F (36.6 C) (Oral)   Resp 18   SpO2 95%   9:30 PM Patient updated on results. Encouraged rest and good hydration. Encouraged primary care physician follow-up in the next several days for recheck. Patient urged to return with worsening symptoms or other concerns. Patient verbalized understanding and agrees with plan.    I personally performed the services described in this documentation, which was scribed in my presence. The recorded information has been reviewed and is accurate.   Final Clinical Impressions(s) / ED Diagnoses   Final diagnoses:  Generalized weakness  Cough  Dysuria   Patient with generalized weakness and fatigue after recent illness including URI symptoms and cough. The symptoms are much improved and nearly resolved. Patient's main complaint is continued malaise. Patient has chronic dysuria. Workup tonight demonstrates slight AKI which I suspect is due to mild dehydration. Otherwise no anemia or leukocytosis. No residual infection on chest x-ray. UA does not demonstrate infection. No focal neurological deficits to suggest stroke. No chest pain, shortness of breath or other anginal equivalents to suggest ACS. It is possible that patient is deconditioned from recent  illness. He would benefit from primary care follow-up in the near future.  New Prescriptions New Prescriptions   No medications on file     [name], Hershal Coria 05/31/16 2132    Davonna Belling, MD 05/31/16 [phone]

## 2016-05-31 NOTE — Discharge Instructions (Signed)
Please read and follow all provided instructions.  Your diagnoses today include:  1. Generalized weakness   2. Cough   3. Dysuria     Tests performed today include:  Chest x-ray - no infection or other problem  Blood counts, electrolytes, and urine test - no severe problems  Vital signs. See below for your results today.   Medications prescribed:   None  Take any prescribed medications only as directed.  Home care instructions:  Follow any educational materials contained in this packet.  BE VERY CAREFUL not to take multiple medicines containing Tylenol (also called acetaminophen). Doing so can lead to an overdose which can damage your liver and cause liver failure and possibly death.   Follow-up instructions: Please follow-up with your primary care provider in the next 3 days for further evaluation of your symptoms.   Return instructions:   Please return to the Emergency Department if you experience worsening symptoms.   Please return if you have any other emergent concerns.  Additional Information:  Your vital signs today were: BP 179/90 (BP Location: Right Arm)    Pulse 74    Temp 97.8 F (36.6 C) (Oral)    Resp 18    SpO2 95%  If your blood pressure (BP) was elevated above 135/85 this visit, please have this repeated by your doctor within one month. --------------

## 2016-06-02 ENCOUNTER — Other Ambulatory Visit: Payer: Self-pay | Admitting: Nurse Practitioner

## 2016-06-02 DIAGNOSIS — G44209 Tension-type headache, unspecified, not intractable: Secondary | ICD-10-CM

## 2016-06-04 ENCOUNTER — Other Ambulatory Visit: Payer: Self-pay | Admitting: Internal Medicine

## 2016-06-14 ENCOUNTER — Encounter: Payer: Self-pay | Admitting: Internal Medicine

## 2016-06-14 ENCOUNTER — Ambulatory Visit (INDEPENDENT_AMBULATORY_CARE_PROVIDER_SITE_OTHER): Payer: Medicare Other | Admitting: Internal Medicine

## 2016-06-14 VITALS — BP 122/78 | HR 82 | Temp 97.9°F | Ht 70.0 in | Wt 198.0 lb

## 2016-06-14 DIAGNOSIS — L570 Actinic keratosis: Secondary | ICD-10-CM | POA: Diagnosis not present

## 2016-06-14 DIAGNOSIS — I10 Essential (primary) hypertension: Secondary | ICD-10-CM

## 2016-06-14 DIAGNOSIS — C439 Malignant melanoma of skin, unspecified: Secondary | ICD-10-CM | POA: Diagnosis not present

## 2016-06-14 DIAGNOSIS — B349 Viral infection, unspecified: Secondary | ICD-10-CM | POA: Diagnosis not present

## 2016-06-14 DIAGNOSIS — L821 Other seborrheic keratosis: Secondary | ICD-10-CM | POA: Diagnosis not present

## 2016-06-14 DIAGNOSIS — E1121 Type 2 diabetes mellitus with diabetic nephropathy: Secondary | ICD-10-CM

## 2016-06-14 DIAGNOSIS — F419 Anxiety disorder, unspecified: Secondary | ICD-10-CM | POA: Diagnosis not present

## 2016-06-14 DIAGNOSIS — E039 Hypothyroidism, unspecified: Secondary | ICD-10-CM | POA: Diagnosis not present

## 2016-06-14 DIAGNOSIS — D485 Neoplasm of uncertain behavior of skin: Secondary | ICD-10-CM | POA: Diagnosis not present

## 2016-06-14 DIAGNOSIS — L814 Other melanin hyperpigmentation: Secondary | ICD-10-CM | POA: Diagnosis not present

## 2016-06-14 DIAGNOSIS — G44209 Tension-type headache, unspecified, not intractable: Secondary | ICD-10-CM | POA: Diagnosis not present

## 2016-06-14 DIAGNOSIS — D1801 Hemangioma of skin and subcutaneous tissue: Secondary | ICD-10-CM | POA: Diagnosis not present

## 2016-06-14 MED ORDER — DIAZEPAM 5 MG PO TABS: ORAL_TABLET | ORAL | 0 refills | Status: DC

## 2016-06-14 NOTE — Progress Notes (Signed)
Facility  [facility]    Place of Service:   OFFICE    Allergies  Allergen Reactions  . Eggs Or Egg-Derived Products Other (See Comments)    Bloating, nausea, and no rash   . Adhesive [Tape] Swelling    Chief Complaint  Patient presents with  . Acute Visit    Weak, fatigue, sweats, chills, and low-grade fever x 4 weeks. Patient was seen at the [facility] on 05/31/16, symptoms ongoing   . Medication Management    RX for diazepam to have on hand when due in March 2018     HPI:  Seeing Almyra Free, PA-C at the [facility] on [address]. Has frozen several skin lesions. Planning to clear skin with an ointment later.  Type 2 diabetes mellitus with diabetic nephropathy, without long-term current use of insulin (HCC) - last glucose 213 mg % when ill at the ER  Viral syndrome - continued fatigue and malaise, but appetite is good. No cough, nausea, or muscular aches.  Essential hypertension - controlled  Melanoma of skin ([place]) - removed from upper back. about 15 years ago. No relapse.  Tension-type headache, not intractable, unspecified chronicity pattern - improves with diazepam (VALIUM) 5 MG tablet  Anxiety - chronic issue. Helped by diazepam.    Medications: Patient's Medications  New Prescriptions   No medications on file  Previous Medications   ASPIRIN EC 325 MG EC TABLET    Take 1 tablet (325 mg total) by mouth daily.   DIAZEPAM (VALIUM) 5 MG TABLET    TAKE 1 TABLET FOUR TIMES A DAY AS NEEDED FOR NERVES   FUROSEMIDE (LASIX) 40 MG TABLET    Take 40 mg by mouth daily as needed (edema). Reported on 10/26/2015   GLUCOSE BLOOD TEST STRIP    1 each by Other route every morning. Use as instructed   IMIPRAMINE (TOFRANIL) 25 MG TABLET    TAKE 3 TABLETS BY MOUTH EVERY MORNING AND 4 TABLET AT BEDTIME   LEVOTHYROXINE (SYNTHROID, LEVOTHROID) 100 MCG TABLET    One daily for thyroid supplement   METFORMIN (GLUCOPHAGE) 500 MG TABLET    Take 1 tablet (500 mg total) by mouth 2 (two)  times daily with a meal.   METOPROLOL TARTRATE (LOPRESSOR) 25 MG TABLET    TAKE ONE TABLET BY MOUTH TWICE DAILY   MOMETASONE (ELOCON) 0.1 % CREAM    Apply 1 application topically daily. For eczema   NITROSTAT 0.4 MG SL TABLET    Place 0.4 mg under the tongue every 5 (five) minutes as needed. Reported on 10/26/2015   OMEGA-3 FATTY ACIDS ([facility] FISH OIL) 1200 MG CAPS    One twice daily to lower triglycerides   ONE TOUCH LANCETS MISC    Check blood sugar daily,   PENTAZOCINE-NALOXONE (TALWIN NX) 50-0.5 MG TABLET    Take 1 tablet by mouth every 4 (four) hours as needed for pain.   POLYETHYLENE GLYCOL (MIRALAX / GLYCOLAX) PACKET    Take 17 g by mouth daily as needed (constipation). Reported on 10/26/2015   SIMVASTATIN (ZOCOR) 20 MG TABLET    TAKE ONE TABLET BY MOUTH ONCE DAILY FOR CHOLESTEROL  Modified Medications   No medications on file  Discontinued Medications   No medications on file    Review of Systems  Constitutional: Positive for fatigue. Negative for activity change, appetite change, chills, fever and unexpected weight change.  HENT: Positive for hearing loss. Negative for congestion, ear pain, rhinorrhea and tinnitus.  Eyes: Negative.   Respiratory: Negative for apnea, cough, choking, chest tightness, shortness of breath and wheezing.   Cardiovascular: Negative for chest pain, palpitations and leg swelling.  Gastrointestinal: Positive for constipation.  Endocrine: Negative for cold intolerance, heat intolerance, polydipsia, polyphagia and polyuria.       Diabetic. Hypothyroid on supplements.  Genitourinary: Positive for dysuria.       Nocturia. Stranguria. Erectile dysfunction.  Musculoskeletal: Positive for arthralgias.  Skin:       Chronic dry skin.  Allergic/Immunologic: Negative.   Neurological:       Recurrent tension headaches.  Hematological: Negative.   Psychiatric/Behavioral:        Chronic anxiety. Some depressive symptoms. History of insomnia.    Vitals:   06/14/16  1534  BP: 122/78  Pulse: 82  Temp: 97.9 F (36.6 C)  TempSrc: Oral  SpO2: 96%  Weight: 198 lb (89.8 kg)  Height: 5' 10"  (1.778 m)   Body mass index is 28.41 kg/m. Wt Readings from Last 3 Encounters:  06/14/16 198 lb (89.8 kg)  04/19/16 194 lb (88 kg)  04/19/16 197 lb 9.6 oz (89.6 kg)      Physical Exam  Constitutional: He is oriented to person, place, and time. He appears well-developed. No distress.  HENT:  Head: Normocephalic and atraumatic.  Right Ear: External ear normal.  Left Ear: External ear normal.  Nose: Nose normal.  Mouth/Throat: Oropharynx is clear and moist.  Mild loss of hearing.  Eyes: Conjunctivae and EOM are normal. Pupils are equal, round, and reactive to light.  Funduscopic does not show microaneurysms or hemorrhages.  Neck:  Upper and lower dentures. Stud in the tongue.  Cardiovascular: Normal rate, regular rhythm, normal heart sounds and intact distal pulses.  Exam reveals no gallop and no friction rub.   No murmur heard. Pulmonary/Chest: No respiratory distress. He has no wheezes. He has no rales. He exhibits no tenderness.  Abdominal: He exhibits no distension and no mass. There is no tenderness.  Genitourinary: Prostate normal. Rectal exam shows guaiac negative stool.  Musculoskeletal: Normal range of motion. He exhibits no edema or tenderness.  Neurological: He is alert and oriented to person, place, and time. He has normal reflexes. No cranial nerve deficit. Coordination normal.  09/16/2014 6CIT= 6  Skin: No rash noted. No erythema. No pallor.  Multiple tattoos. Multiple piercings. Shave/moles. 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 Polypoid growth of the left lower eyelid. Multiple scabs where he has cut himself (removing skin cancers). Multiple healing areas of skin that were recently frozen for skin cancers.  Psychiatric: He has a normal mood and affect. His behavior is normal. Thought content normal.    Labs  reviewed: Lab Summary Latest Ref Rng & Units 05/31/2016 04/17/2016 10/22/2015  Hemoglobin 13.0 - 17.0 g/dL 15.9 (None) (None)  Hematocrit 39.0 - 52.0 % 46.7 (None) (None)  White count 4.0 - 10.5 K/uL 10.4 (None) (None)  Platelet count 150 - 400 K/uL 126(L) (None) (None)  Sodium 135 - 145 mmol/L 137 139 145(H)  Potassium 3.5 - 5.1 mmol/L 4.4 4.3 4.5  Calcium 8.9 - 10.3 mg/dL 9.3 9.4 9.2  Phosphorus - (None) (None) (None)  Creatinine 0.61 - 1.24 mg/dL 1.27(H) 1.05 1.00  AST 10 - 35 U/L (None) 16 15  Alk Phos 40 - 115 U/L (None) 93 102  Bilirubin 0.2 - 1.2 mg/dL (None) 0.6 0.4  Glucose 65 - 99 mg/dL 213(H) 140(H) 109(H)  Cholesterol <200 mg/dL (None) 166 (None)  HDL cholesterol >40 mg/dL (None) 34(L) 30(L)  Triglycerides <150 mg/dL (None) 242(H) 250(H)  LDL Direct - (None) (None) (None)  LDL Calc <100 mg/dL (None) 84 48  Total protein 6.1 - 8.1 g/dL (None) 7.8 (None)  Albumin 3.6 - 5.1 g/dL (None) 4.6 4.3  Some recent data might be hidden   Lab Results  Component Value Date   TSH 4.60 (H) 04/17/2016   TSH 0.270 (L) 10/22/2015   TSH 5.050 (H) 06/04/2015   Lab Results  Component Value Date   BUN 22 (H) 05/31/2016   BUN 14 04/17/2016   BUN 14 10/22/2015   Lab Results  Component Value Date   HGBA1C 6.6 (H) 04/17/2016   HGBA1C 6.3 (H) 10/22/2015   HGBA1C 8.4 (H) 06/04/2015    Assessment/Plan  1. Viral syndrome Malaise and fatigue shpould improve with time  2. Type 2 diabetes mellitus with diabetic nephropathy, without long-term current use of insulin (HCC) - Hemoglobin A1c; Future - Comprehensive metabolic panel; Future - Microalbumin, urine; Future  3. Essential hypertension The current medical regimen is effective;  continue present plan and medications.  4. Melanoma of skin ([place]) resolved  5. Tension-type headache, not intractable, unspecified chronicity pattern - diazepam (VALIUM) 5 MG tablet; TAKE 1 TABLET FOUR TIMES A DAY AS NEEDED FOR NERVES  Dispense: 120  tablet; Refill: 0  6. Anxiety Usee diazepam prn  7. Hypothyroidism, unspecified type - TSH; Future

## 2016-08-06 ENCOUNTER — Other Ambulatory Visit: Payer: Self-pay | Admitting: Internal Medicine

## 2016-08-06 DIAGNOSIS — G44209 Tension-type headache, unspecified, not intractable: Secondary | ICD-10-CM

## 2016-08-08 ENCOUNTER — Other Ambulatory Visit: Payer: Self-pay | Admitting: Internal Medicine

## 2016-08-28 ENCOUNTER — Other Ambulatory Visit: Payer: Medicare Other

## 2016-08-28 DIAGNOSIS — I10 Essential (primary) hypertension: Secondary | ICD-10-CM

## 2016-08-28 DIAGNOSIS — E1151 Type 2 diabetes mellitus with diabetic peripheral angiopathy without gangrene: Secondary | ICD-10-CM

## 2016-08-28 DIAGNOSIS — E039 Hypothyroidism, unspecified: Secondary | ICD-10-CM | POA: Diagnosis not present

## 2016-08-28 DIAGNOSIS — E1121 Type 2 diabetes mellitus with diabetic nephropathy: Secondary | ICD-10-CM

## 2016-08-28 LAB — COMPREHENSIVE METABOLIC PANEL
ALBUMIN: 4.5 g/dL (ref 3.6–5.1)
ALT: 12 U/L (ref 9–46)
AST: 13 U/L (ref 10–35)
Alkaline Phosphatase: 113 U/L (ref 40–115)
BILIRUBIN TOTAL: 0.5 mg/dL (ref 0.2–1.2)
BUN: 11 mg/dL (ref 7–25)
CALCIUM: 9.3 mg/dL (ref 8.6–10.3)
CO2: 26 mmol/L (ref 20–31)
Chloride: 104 mmol/L (ref 98–110)
Creat: 1.25 mg/dL (ref 0.70–1.25)
Glucose, Bld: 161 mg/dL — ABNORMAL HIGH (ref 65–99)
Potassium: 4.4 mmol/L (ref 3.5–5.3)
Sodium: 142 mmol/L (ref 135–146)
TOTAL PROTEIN: 7.7 g/dL (ref 6.1–8.1)

## 2016-08-28 LAB — TSH: TSH: 6.58 m[IU]/L — AB (ref 0.40–4.50)

## 2016-08-28 LAB — BASIC METABOLIC PANEL
BUN: 11 mg/dL (ref 7–25)
CHLORIDE: 104 mmol/L (ref 98–110)
CO2: 26 mmol/L (ref 20–31)
Calcium: 9.3 mg/dL (ref 8.6–10.3)
Creat: 1.25 mg/dL (ref 0.70–1.25)
Glucose, Bld: 161 mg/dL — ABNORMAL HIGH (ref 65–99)
POTASSIUM: 4.4 mmol/L (ref 3.5–5.3)
SODIUM: 142 mmol/L (ref 135–146)

## 2016-08-29 LAB — HEMOGLOBIN A1C
HEMOGLOBIN A1C: 6.7 % — AB (ref <5.7)
Mean Plasma Glucose: 146 mg/dL

## 2016-08-29 LAB — MICROALBUMIN, URINE: Microalb, Ur: 1.5 mg/dL

## 2016-08-30 ENCOUNTER — Encounter: Payer: Self-pay | Admitting: Internal Medicine

## 2016-08-30 ENCOUNTER — Ambulatory Visit (INDEPENDENT_AMBULATORY_CARE_PROVIDER_SITE_OTHER): Payer: Medicare Other | Admitting: Internal Medicine

## 2016-08-30 VITALS — BP 138/78 | HR 61 | Temp 97.4°F | Ht 70.0 in | Wt 197.0 lb

## 2016-08-30 DIAGNOSIS — E1151 Type 2 diabetes mellitus with diabetic peripheral angiopathy without gangrene: Secondary | ICD-10-CM

## 2016-08-30 DIAGNOSIS — F3342 Major depressive disorder, recurrent, in full remission: Secondary | ICD-10-CM | POA: Diagnosis not present

## 2016-08-30 DIAGNOSIS — E039 Hypothyroidism, unspecified: Secondary | ICD-10-CM

## 2016-08-30 DIAGNOSIS — I10 Essential (primary) hypertension: Secondary | ICD-10-CM

## 2016-08-30 DIAGNOSIS — I2583 Coronary atherosclerosis due to lipid rich plaque: Secondary | ICD-10-CM | POA: Diagnosis not present

## 2016-08-30 DIAGNOSIS — G44209 Tension-type headache, unspecified, not intractable: Secondary | ICD-10-CM | POA: Diagnosis not present

## 2016-08-30 DIAGNOSIS — I2510 Atherosclerotic heart disease of native coronary artery without angina pectoris: Secondary | ICD-10-CM | POA: Diagnosis not present

## 2016-08-30 MED ORDER — DIAZEPAM 5 MG PO TABS: ORAL_TABLET | ORAL | 0 refills | Status: DC

## 2016-08-30 MED ORDER — PENTAZOCINE-NALOXONE 50-0.5 MG PO TABS: 1.0000 | ORAL_TABLET | ORAL | 0 refills | Status: DC | PRN

## 2016-08-30 MED ORDER — LEVOTHYROXINE SODIUM 112 MCG PO TABS: 112.0000 ug | ORAL_TABLET | Freq: Every day | ORAL | 3 refills | Status: DC

## 2016-08-30 NOTE — Progress Notes (Signed)
Facility  [facility]    Place of Service:   OFFICE    Allergies  Allergen Reactions  . Eggs Or Egg-Derived Products Other (See Comments)    Bloating, nausea, and no rash   . Adhesive [Tape] Swelling    Chief Complaint  Patient presents with  . Medical Management of Chronic Issues    4 month medication management blood pressure, blood sugar, thyroid, anxiety, review labs.    HPI:   Essential hypertension - controlled  Controlled type 2 DM with peripheral circulatory disorder (HCC) - controlled  Hypothyroidism, unspecified type - rising TSH  Recurrent major depressive disorder, in full remission ([place]) - stable  CAD - No angina r palpitations. Would like referral to cardiology  Medications: Patient's Medications  New Prescriptions   No medications on file  Previous Medications   ASPIRIN EC 325 MG EC TABLET    Take 1 tablet (325 mg total) by mouth daily.   DIAZEPAM (VALIUM) 5 MG TABLET    TAKE 1 TABLET BY MOUTH FOUR TIMES A DAY AS NEEDED FOR NERVES   FUROSEMIDE (LASIX) 40 MG TABLET    Take 40 mg by mouth daily as needed (edema). Reported on 10/26/2015   GLUCOSE BLOOD TEST STRIP    1 each by Other route every morning. Use as instructed   IMIPRAMINE (TOFRANIL) 25 MG TABLET    TAKE 3 TABLETS BY MOUTH EVERY MORNING AND 4 TABLET AT BEDTIME   LEVOTHYROXINE (SYNTHROID, LEVOTHROID) 100 MCG TABLET    One daily for thyroid supplement   METFORMIN (GLUCOPHAGE) 500 MG TABLET    Take 1 tablet (500 mg total) by mouth 2 (two) times daily with a meal.   METOPROLOL TARTRATE (LOPRESSOR) 25 MG TABLET    TAKE ONE TABLET BY MOUTH TWICE DAILY   MOMETASONE (ELOCON) 0.1 % CREAM    Apply 1 application topically daily. For eczema   NITROSTAT 0.4 MG SL TABLET    Place 0.4 mg under the tongue every 5 (five) minutes as needed. Reported on 10/26/2015   OMEGA-3 FATTY ACIDS ([employer] FISH OIL) 1200 MG CAPS    One twice daily to lower triglycerides   ONE TOUCH LANCETS MISC    Check blood sugar daily,   PENTAZOCINE-NALOXONE (TALWIN NX) 50-0.5 MG TABLET    Take 1 tablet by mouth every 4 (four) hours as needed for pain.   POLYETHYLENE GLYCOL (MIRALAX / GLYCOLAX) PACKET    Take 17 g by mouth daily as needed (constipation). Reported on 10/26/2015   SIMVASTATIN (ZOCOR) 20 MG TABLET    TAKE ONE TABLET BY MOUTH ONCE DAILY FOR CHOLESTEROL  Modified Medications   No medications on file  Discontinued Medications   No medications on file    Review of Systems  Constitutional: Positive for fatigue. Negative for activity change, appetite change, chills, fever and unexpected weight change.  HENT: Positive for hearing loss. Negative for congestion, ear pain, rhinorrhea and tinnitus.   Eyes: Negative.   Respiratory: Negative for apnea, cough, choking, chest tightness, shortness of breath and wheezing.   Cardiovascular: Negative for chest pain, palpitations and leg swelling.  Gastrointestinal: Positive for constipation.  Endocrine: Negative for cold intolerance, heat intolerance, polydipsia, polyphagia and polyuria.       Diabetic. Hypothyroid on supplements.  Genitourinary: Positive for dysuria.       Nocturia. Stranguria. Erectile dysfunction.  Musculoskeletal: Positive for arthralgias.  Skin:       Chronic dry skin.  Allergic/Immunologic: Negative.   Neurological:  Recurrent tension headaches.  Hematological: Negative.   Psychiatric/Behavioral:        Chronic anxiety. Some depressive symptoms. History of insomnia.    Vitals:   08/30/16 1051  BP: 138/78  Pulse: 61  Temp: 97.4 F (36.3 C)  TempSrc: Oral  SpO2: 93%  Weight: 197 lb (89.4 kg)  Height: 5' 10"  (1.778 m)   Body mass index is 28.27 kg/m. Wt Readings from Last 3 Encounters:  08/30/16 197 lb (89.4 kg)  06/14/16 198 lb (89.8 kg)  04/19/16 194 lb (88 kg)      Physical Exam  Constitutional: He is oriented to person, place, and time. He appears well-developed. No distress.  HENT:  Head: Normocephalic and atraumatic.    Right Ear: External ear normal.  Left Ear: External ear normal.  Nose: Nose normal.  Mouth/Throat: Oropharynx is clear and moist.  Mild loss of hearing.  Eyes: Conjunctivae and EOM are normal. Pupils are equal, round, and reactive to light.  Funduscopic does not show microaneurysms or hemorrhages.  Neck:  Upper and lower dentures. Stud in the tongue.  Cardiovascular: Normal rate, regular rhythm, normal heart sounds and intact distal pulses.  Exam reveals no gallop and no friction rub.   No murmur heard. Pulmonary/Chest: No respiratory distress. He has no wheezes. He has no rales. He exhibits no tenderness.  Abdominal: He exhibits no distension and no mass. There is no tenderness.  Genitourinary: Prostate normal. Rectal exam shows guaiac negative stool.  Musculoskeletal: Normal range of motion. He exhibits no edema or tenderness.  Neurological: He is alert and oriented to person, place, and time. He has normal reflexes. No cranial nerve deficit. Coordination normal.  09/16/2014 6CIT= 6  Skin: No rash noted. No erythema. No pallor.  Multiple tattoos. Multiple piercings. Shave/moles. 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 Polypoid growth of the left lower eyelid. Multiple scabs where he has cut himself (removing skin cancers). Multiple healing areas of skin that were recently frozen for skin cancers.  Psychiatric: He has a normal mood and affect. His behavior is normal. Thought content normal.    Labs reviewed: Lab Summary Latest Ref Rng & Units 08/28/2016 08/28/2016 05/31/2016 04/17/2016  Hemoglobin 13.0 - 17.0 g/dL (None) (None) 15.9 (None)  Hematocrit 39.0 - 52.0 % (None) (None) 46.7 (None)  White count 4.0 - 10.5 K/uL (None) (None) 10.4 (None)  Platelet count 150 - 400 K/uL (None) (None) 126(L) (None)  Sodium 135 - 146 mmol/L 142 142 137 139  Potassium 3.5 - 5.3 mmol/L 4.4 4.4 4.4 4.3  Calcium 8.6 - 10.3 mg/dL 9.3 9.3 9.3 9.4  Phosphorus - (None) (None) (None)  (None)  Creatinine 0.70 - 1.25 mg/dL 1.25 1.25 1.27(H) 1.05  AST 10 - 35 U/L 13 (None) (None) 16  Alk Phos 40 - 115 U/L 113 (None) (None) 93  Bilirubin 0.2 - 1.2 mg/dL 0.5 (None) (None) 0.6  Glucose 65 - 99 mg/dL 161(H) 161(H) 213(H) 140(H)  Cholesterol <200 mg/dL (None) (None) (None) 166  HDL cholesterol >40 mg/dL (None) (None) (None) 34(L)  Triglycerides <150 mg/dL (None) (None) (None) 242(H)  LDL Direct - (None) (None) (None) (None)  LDL Calc <100 mg/dL (None) (None) (None) 84  Total protein 6.1 - 8.1 g/dL 7.7 (None) (None) 7.8  Albumin 3.6 - 5.1 g/dL 4.5 (None) (None) 4.6  Some recent data might be hidden   Lab Results  Component Value Date   TSH 6.58 (H) 08/28/2016   TSH 4.60 (H) 04/17/2016  TSH 0.270 (L) 10/22/2015   Lab Results  Component Value Date   BUN 11 08/28/2016   BUN 11 08/28/2016   BUN 22 (H) 05/31/2016   Lab Results  Component Value Date   HGBA1C 6.7 (H) 08/28/2016   HGBA1C 6.6 (H) 04/17/2016   HGBA1C 6.3 (H) 10/22/2015    Assessment/Plan  1. Essential hypertension Controlled The current medical regimen is effective;  continue present plan and medications.  2. Controlled type 2 DM with peripheral circulatory disorder [facility]) The current medical regimen is effective;  continue present plan and medications. - Hemoglobin A1c; Future - Basic metabolic panel; Future  3. Hypothyroidism, unspecified type - levothyroxine (SYNTHROID, LEVOTHROID) 112 MCG tablet; Take 1 tablet (112 mcg total) by mouth daily.  Dispense: 90 tablet; Refill: 3 - TSH; Future  4. Recurrent major depressive disorder, in full remission ([place]) The current medical regimen is effective;  continue present plan and medications.  5. Tension-type headache, not intractable, unspecified chronicity pattern - pentazocine-naloxone (TALWIN NX) 50-0.5 MG tablet; Take 1 tablet by mouth every 4 (four) hours as needed for pain.  Dispense: 90 tablet; Refill: 0 - diazepam (VALIUM) 5 MG tablet; TAKE 1  TABLET BY MOUTH FOUR TIMES A DAY AS NEEDED FOR NERVES  Dispense: 120 tablet; Refill: 0  6. Coronary artery disease due to lipid rich plaque - Ambulatory referral to Cardiology

## 2016-09-07 ENCOUNTER — Encounter: Payer: Self-pay | Admitting: Cardiology

## 2016-10-05 ENCOUNTER — Other Ambulatory Visit: Payer: Self-pay | Admitting: Internal Medicine

## 2016-10-05 DIAGNOSIS — G44209 Tension-type headache, unspecified, not intractable: Secondary | ICD-10-CM

## 2016-10-16 ENCOUNTER — Other Ambulatory Visit: Payer: Self-pay | Admitting: Internal Medicine

## 2016-10-24 DIAGNOSIS — I119 Hypertensive heart disease without heart failure: Secondary | ICD-10-CM | POA: Diagnosis not present

## 2016-10-24 DIAGNOSIS — E1159 Type 2 diabetes mellitus with other circulatory complications: Secondary | ICD-10-CM | POA: Diagnosis not present

## 2016-10-24 DIAGNOSIS — I2510 Atherosclerotic heart disease of native coronary artery without angina pectoris: Secondary | ICD-10-CM | POA: Diagnosis not present

## 2016-10-25 NOTE — Addendum Note (Signed)
Addended by: Royann Shivers A on: 10/25/2016 02:56 PM   Modules accepted: Orders

## 2016-11-07 ENCOUNTER — Other Ambulatory Visit: Payer: Self-pay | Admitting: Internal Medicine

## 2016-11-07 DIAGNOSIS — G44209 Tension-type headache, unspecified, not intractable: Secondary | ICD-10-CM

## 2016-11-16 ENCOUNTER — Telehealth: Payer: Self-pay

## 2016-11-16 NOTE — Telephone Encounter (Signed)
Patient's wife called requesting a letter to excuse patient from [employer] on 01/08/17 due to Anxiety. Dr.Green wrote letter in the past, please advise   Side note: you have not seen patient yet, pending appointment for 01/08/17

## 2016-11-16 NOTE — Telephone Encounter (Signed)
If he'd like to come see Janett Billow sooner for an eval about whether he is ok to do jury duty, that can be arranged.  I can't do a letter w/o seeing him though.

## 2016-11-17 NOTE — Telephone Encounter (Signed)
Spoke with patient's wife, scheduled appointment for 11/27/16 with Sherrie Mustache, NP  Patient's wife instructed to bring [employer] Duty letter to appointment

## 2016-11-19 ENCOUNTER — Other Ambulatory Visit: Payer: Self-pay | Admitting: Internal Medicine

## 2016-11-27 ENCOUNTER — Encounter: Payer: Self-pay | Admitting: Nurse Practitioner

## 2016-11-27 ENCOUNTER — Ambulatory Visit (INDEPENDENT_AMBULATORY_CARE_PROVIDER_SITE_OTHER): Payer: Medicare Other | Admitting: Nurse Practitioner

## 2016-11-27 VITALS — BP 132/84 | HR 66 | Temp 97.7°F | Resp 17 | Ht 70.0 in | Wt 196.4 lb

## 2016-11-27 DIAGNOSIS — F419 Anxiety disorder, unspecified: Secondary | ICD-10-CM

## 2016-11-27 DIAGNOSIS — F3342 Major depressive disorder, recurrent, in full remission: Secondary | ICD-10-CM

## 2016-11-27 DIAGNOSIS — K5900 Constipation, unspecified: Secondary | ICD-10-CM | POA: Diagnosis not present

## 2016-11-27 DIAGNOSIS — R413 Other amnesia: Secondary | ICD-10-CM

## 2016-11-27 NOTE — Patient Instructions (Signed)
To use benefiber daily to help with diarrhea/constipation

## 2016-11-27 NOTE — Progress Notes (Signed)
Careteam: Patient Care Team: Gayland Curry, DO as PCP - General (Geriatric Medicine)  Advanced Directive information Does Patient Have a Medical Advance Directive?: No  Allergies  Allergen Reactions  . Eggs Or Egg-Derived Products Other (See Comments)    Bloating, nausea, and no rash   . Adhesive [Tape] Swelling    Chief Complaint  Patient presents with  . Acute Visit    Pt is being seen due to need letter to excuse him from jury duty on 01/08/17 due to anxiety.   . Other    wife in room with pt.     HPI: Patient is a 66 y.o. male seen in the office today due to needing to be excused from jury duty. Pt with hx of htn, anxiety, depression, type 2 diabetes, CAD.  Former pt of Dr Nyoka Cowden who has written him out of jury duty.   Pt reports he has anxiety with panic attacks- depending on what he is doing or what stress he is under.  Wife states they have 5 kids at home and it does not take long to have one. 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  Taking valium QID for 30 days.  Reports he has tried other medications which have not helped. Has been to mental health in the past. psychiatrist had tired him on a lot of medications that were not helpful.  In the past did not drive a vehicle due to anxiety and depression being uncontrolled. Has been better controlled recently but still having panic attacks.   Wife states he is having diarrhea but since he was on metformin but pt reports he is taking miralax weekly and reports problems with constipation. Will occasionally have loose stools.   Review of Systems:  Review of Systems  Constitutional: Negative for weight loss.  HENT: Positive for hearing loss. Negative for tinnitus.   Respiratory: Negative for cough, sputum production and shortness of breath.   Cardiovascular: Negative for chest pain, palpitations and leg swelling.  Gastrointestinal: Positive for constipation and diarrhea. Negative for abdominal pain and  heartburn.  Genitourinary: Negative for dysuria, frequency and urgency.  Musculoskeletal: Negative for back pain, falls, joint pain and myalgias.  Skin: Negative.   Neurological: Positive for headaches (chronic migraines). Negative for dizziness.  Psychiatric/Behavioral: Positive for memory loss. Negative for depression. The patient is nervous/anxious. The patient does not have insomnia.        Wife reports memory loss, pt states he has noticed memory decline, can not explain this.  03/2016 MMSE 27/30     Past Medical History:  Diagnosis Date  . Anxiety   . Cancer ([place])    skin cancer  basel cell carcinoma  . Cholecystitis   . Controlled type 2 DM with peripheral circulatory disorder ([place]) 09/20/2014  . Coronary artery disease   . Depression   . Headache(784.0)   . HTN (hypertension)   . Hx of skin cancer, basal cell   . Hyperlipidemia   . Hypothyroidism   . PAD (peripheral artery disease) ([place]) 09/20/2014  . Restless leg syndrome    Past Surgical History:  Procedure Laterality Date  . BASAL CELL CARCINOMA EXCISION  2000   Dr,Arkin   . CARDIAC CATHETERIZATION  07/07/2011  . CHOLECYSTECTOMY  09/14/2011   Procedure: LAPAROSCOPIC CHOLECYSTECTOMY WITH INTRAOPERATIVE CHOLANGIOGRAM;  Surgeon: Gwenyth Ober, MD;  Location: [facility];  Service: General;  Laterality: N/A;  . CORONARY ARTERY BYPASS GRAFT  07/10/2011   Procedure: CORONARY ARTERY BYPASS  GRAFTING (CABG);  Surgeon: Ivin Poot, MD;  Location: [facility];  Service: Open Heart Surgery;  Laterality: N/A;  . EXCISIONAL HEMORRHOIDECTOMY  1997  . HEMORROIDECTOMY  2000  . LEFT HEART CATHETERIZATION WITH CORONARY ANGIOGRAM N/A 07/07/2011   Procedure: LEFT HEART CATHETERIZATION WITH CORONARY ANGIOGRAM;  Surgeon: Burnell Blanks, MD;  Location: [facility] CATH LAB;  Service: Cardiovascular;  Laterality: N/A;   Social History:   reports that he quit smoking about 35 years ago. He has quit using smokeless tobacco. He reports that he does not  drink alcohol or use drugs.  Family History  Problem Relation Age of Onset  . Coronary artery disease Father   . Suicidality Brother   . Coronary artery disease Paternal Grandfather     Medications: Patient's Medications  New Prescriptions   No medications on file  Previous Medications   ASPIRIN EC 325 MG EC TABLET    Take 1 tablet (325 mg total) by mouth daily.   DIAZEPAM (VALIUM) 5 MG TABLET    TAKE 1 TABLET BY MOUTH FOUR TIMES A DAY AS NEEDED FOR NERVES   FUROSEMIDE (LASIX) 40 MG TABLET    Take 40 mg by mouth daily as needed (edema). Reported on 10/26/2015   GLUCOSE BLOOD TEST STRIP    1 each by Other route every morning. Use as instructed   IMIPRAMINE (TOFRANIL) 25 MG TABLET    TAKE 3 TABLETS BY MOUTH EVERY MORNING AND 4 TABLET AT BEDTIME   LEVOTHYROXINE (SYNTHROID, LEVOTHROID) 112 MCG TABLET    Take 1 tablet (112 mcg total) by mouth daily.   METFORMIN (GLUCOPHAGE) 500 MG TABLET    Take 1 tablet (500 mg total) by mouth 2 (two) times daily with a meal.   METOPROLOL TARTRATE (LOPRESSOR) 25 MG TABLET    TAKE ONE TABLET BY MOUTH TWICE DAILY   MOMETASONE (ELOCON) 0.1 % CREAM    Apply 1 application topically daily. For eczema   NITROSTAT 0.4 MG SL TABLET    Place 0.4 mg under the tongue every 5 (five) minutes as needed. Reported on 10/26/2015   OMEGA-3 FATTY ACIDS ([employer] FISH OIL) 1200 MG CAPS    One twice daily to lower triglycerides   ONE TOUCH LANCETS MISC    Check blood sugar daily,   PENTAZOCINE-NALOXONE (TALWIN NX) 50-0.5 MG TABLET    Take 1 tablet by mouth every 4 (four) hours as needed for pain.   POLYETHYLENE GLYCOL (MIRALAX / GLYCOLAX) PACKET    Take 17 g by mouth daily as needed (constipation). Reported on 10/26/2015   SIMVASTATIN (ZOCOR) 20 MG TABLET    TAKE ONE TABLET BY MOUTH ONCE DAILY FOR CHOLESTEROL  Modified Medications   No medications on file  Discontinued Medications   No medications on file     Physical Exam:  Vitals:   11/27/16 0907  BP: 132/84  Pulse: 66    Resp: 17  Temp: 97.7 F (36.5 C)  TempSrc: Oral  SpO2: 95%  Weight: 196 lb 6.4 oz (89.1 kg)  Height: 5\' 10"  (1.778 m)   Body mass index is 28.18 kg/m.  Physical Exam  Constitutional: He is oriented to person, place, and time. He appears well-developed. No distress.  HENT:  Head: Normocephalic and atraumatic.  Right Ear: External ear normal.  Left Ear: External ear normal.  Nose: Nose normal.  Mouth/Throat: Oropharynx is clear and moist.  Mild loss of hearing.  Eyes: Pupils are equal, round, and reactive to light. Conjunctivae and EOM are normal.  Funduscopic does not show microaneurysms or hemorrhages.  Neck:  Upper and lower dentures. Stud in the tongue.  Cardiovascular: Normal rate, regular rhythm, normal heart sounds and intact distal pulses.  Exam reveals no gallop and no friction rub.   No murmur heard. Pulmonary/Chest: No respiratory distress. He has no wheezes. He has no rales. He exhibits no tenderness.  Abdominal: He exhibits no distension and no mass. There is no tenderness.  Genitourinary: Prostate normal. Rectal exam shows guaiac negative stool.  Musculoskeletal: Normal range of motion. He exhibits no edema or tenderness.  Neurological: He is alert and oriented to person, place, and time. He has normal reflexes. No cranial nerve deficit. Coordination normal.  Skin: No rash noted. No erythema. No pallor.  Multiple tattoos. Multiple piercings. Shave/moles. 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 Polypoid growth of the left lower eyelid.   Psychiatric: Thought content normal. His mood appears anxious. His speech is rapid and/or pressured. He is agitated.    Labs reviewed: Basic Metabolic Panel:  Recent Labs  04/17/16 0928 05/31/16 1930 08/28/16 0819  NA 139 137 142  142  K 4.3 4.4 4.4  4.4  CL 100 96* 104  104  CO2 28 32 26  26  GLUCOSE 140* 213* 161*  161*  BUN 14 22* 11  11  CREATININE 1.05 1.27* 1.25  1.25  CALCIUM 9.4 9.3 9.3   9.3  TSH 4.60*  --  6.58*   Liver Function Tests:  Recent Labs  04/17/16 0928 08/28/16 0819  AST 16 13  ALT 13 12  ALKPHOS 93 113  BILITOT 0.6 0.5  PROT 7.8 7.7  ALBUMIN 4.6 4.5   No results for input(s): LIPASE, AMYLASE in the last 8760 hours. No results for input(s): AMMONIA in the last 8760 hours. CBC:  Recent Labs  05/31/16 1930  WBC 10.4  NEUTROABS 5.2  HGB 15.9  HCT 46.7  MCV 89.6  PLT 126*   Lipid Panel:  Recent Labs  04/17/16 0928  CHOL 166  HDL 34*  LDLCALC 84  TRIG 242*  CHOLHDL 4.9   TSH:  Recent Labs  04/17/16 0928 08/28/16 0819  TSH 4.60* 6.58*   A1C: Lab Results  Component Value Date   HGBA1C 6.7 (H) 08/28/2016   MMSE - Mini Mental State Exam 04/19/2016 11/11/2013  Orientation to time 5 4  Orientation to Place 5 5  Registration 3 3  Attention/ Calculation 4 4  Recall 2 2  Language- name 2 objects 2 2  Language- repeat 1 1  Language- follow 3 step command 3 3  Language- read & follow direction 1 1  Write a sentence 1 1  Copy design 0 0  Total score 27 26    Assessment/Plan 1. Recurrent major depressive disorder, in full remission ([place]) and anxiety  -stable. Wife reports he does not like that he is being seen by someone other than Dr Nyoka Cowden, explained that Dr Nyoka Cowden has retired. Wife reports he is agitated due to providers retiring. Very defensive and agitated during visit.  conts on valium QID scheduled.  - letter written for [employer] duty (see communications)   2. Memory loss -MMSE 27/30 in December 2017, will cont to monitor  3. Constipation, unspecified constipation type Encouraged to use benefiber daily to help with constipation.   As pt and wife leave; both become agitated and report they plan to go to other office for ongoing primary care.  Carlos American. Harle Battiest  [employer]  [facility] [phone] 8 am - 5 pm) [phone] (after hours)

## 2016-11-30 ENCOUNTER — Other Ambulatory Visit: Payer: Self-pay

## 2016-11-30 DIAGNOSIS — E039 Hypothyroidism, unspecified: Secondary | ICD-10-CM

## 2016-12-06 ENCOUNTER — Other Ambulatory Visit: Payer: Self-pay | Admitting: Internal Medicine

## 2016-12-06 DIAGNOSIS — G44209 Tension-type headache, unspecified, not intractable: Secondary | ICD-10-CM

## 2016-12-06 NOTE — Telephone Encounter (Signed)
Spoke with patient to confirm that he will remain under the care of [facility], patient states "Yes"

## 2017-01-02 ENCOUNTER — Other Ambulatory Visit: Payer: Medicare Other

## 2017-01-02 ENCOUNTER — Other Ambulatory Visit: Payer: Self-pay

## 2017-01-02 DIAGNOSIS — E1151 Type 2 diabetes mellitus with diabetic peripheral angiopathy without gangrene: Secondary | ICD-10-CM

## 2017-01-02 DIAGNOSIS — I10 Essential (primary) hypertension: Secondary | ICD-10-CM | POA: Diagnosis not present

## 2017-01-03 LAB — BASIC METABOLIC PANEL
BUN: 17 mg/dL (ref 7–25)
CO2: 23 mmol/L (ref 20–32)
Calcium: 9.1 mg/dL (ref 8.6–10.3)
Chloride: 100 mmol/L (ref 98–110)
Creat: 1.17 mg/dL (ref 0.70–1.25)
Glucose, Bld: 138 mg/dL — ABNORMAL HIGH (ref 65–99)
Potassium: 4.1 mmol/L (ref 3.5–5.3)
Sodium: 139 mmol/L (ref 135–146)

## 2017-01-03 LAB — HEMOGLOBIN A1C
Hgb A1c MFr Bld: 6.7 % — ABNORMAL HIGH (ref <5.7)
Mean Plasma Glucose: 146 mg/dL

## 2017-01-04 ENCOUNTER — Ambulatory Visit: Payer: Medicare Other | Admitting: Nurse Practitioner

## 2017-01-08 ENCOUNTER — Ambulatory Visit (INDEPENDENT_AMBULATORY_CARE_PROVIDER_SITE_OTHER): Payer: Medicare Other | Admitting: Internal Medicine

## 2017-01-08 ENCOUNTER — Other Ambulatory Visit: Payer: Self-pay | Admitting: Internal Medicine

## 2017-01-08 ENCOUNTER — Encounter: Payer: Self-pay | Admitting: Internal Medicine

## 2017-01-08 VITALS — BP 138/80 | HR 94 | Temp 97.8°F | Ht 70.0 in | Wt 192.0 lb

## 2017-01-08 DIAGNOSIS — G44209 Tension-type headache, unspecified, not intractable: Secondary | ICD-10-CM

## 2017-01-08 DIAGNOSIS — E1151 Type 2 diabetes mellitus with diabetic peripheral angiopathy without gangrene: Secondary | ICD-10-CM

## 2017-01-08 DIAGNOSIS — G44229 Chronic tension-type headache, not intractable: Secondary | ICD-10-CM | POA: Diagnosis not present

## 2017-01-08 DIAGNOSIS — F3342 Major depressive disorder, recurrent, in full remission: Secondary | ICD-10-CM | POA: Diagnosis not present

## 2017-01-08 DIAGNOSIS — I119 Hypertensive heart disease without heart failure: Secondary | ICD-10-CM

## 2017-01-08 DIAGNOSIS — I2583 Coronary atherosclerosis due to lipid rich plaque: Secondary | ICD-10-CM

## 2017-01-08 DIAGNOSIS — E039 Hypothyroidism, unspecified: Secondary | ICD-10-CM | POA: Diagnosis not present

## 2017-01-08 DIAGNOSIS — I2510 Atherosclerotic heart disease of native coronary artery without angina pectoris: Secondary | ICD-10-CM | POA: Diagnosis not present

## 2017-01-08 MED ORDER — PENTAZOCINE-NALOXONE 50-0.5 MG PO TABS: 1.0000 | ORAL_TABLET | ORAL | 0 refills | Status: DC | PRN

## 2017-01-08 MED ORDER — DIAZEPAM 5 MG PO TABS: 5.0000 mg | ORAL_TABLET | Freq: Four times a day (QID) | ORAL | 0 refills | Status: DC | PRN

## 2017-01-08 NOTE — Progress Notes (Signed)
Location:  [facility] clinic Provider:  Jujuan Dugo L. Mariea Clonts, D.O., C.M.D.  Code Status: full code Goals of Care:  Advanced Directives 01/08/2017  Does Patient Have a Medical Advance Directive? No  Type of Advance Directive -  Copy of [place] in Chart? -  Would patient like information on creating a medical advance directive? No - Patient declined  Pre-existing out of facility DNR order (yellow form or pink MOST form) -   Chief Complaint  Patient presents with  . Medical Management of Chronic Issues    med mgt and to transfer care to me after Dr. Rolly Salter retirement    HPI: Patient is a 66 y.o. male seen today for medical management of chronic diseases and to establish with me after Dr. Rolly Salter retirement.    HTN: bp <140/90.    CAD:  3/18 angina with positive treadmill and cath with 90% distal left main, 70% OM, 90% mid RCA and 70% PDA, normal LV--had CABG with LIMA to LAD, SVG to diagnonal, SVG to OM, SVG to PL 07/10/11, Dr. Nils Pyle.  On asa 325mg .  His rhythm was out of whack after his surgery.  He says it was due to his being off his pain and anxiety meds.  Has never had to take ntg for chest pain.    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   He does not test his sugar.  He is taking his metformin.     Depression and anxiety with panic attacks:  Wants refills of his imipramine and diazepam.  The diazepam he takes 4x per day.    PAD:  Feet are numb, but may be combination of this and neuropathy.  Feels like they go to sleep.     Melanoma 15 yrs ago on the back of his neck on his left cheek.  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  Has had several skin cancers after [place].    follows with the skin center.  Also requests refill of talwin rx which he's been on for headaches--sometimes has to go to the hospital for a shot for headaches.  Real bad headaches right between the eyes.  Feels  like something sharp is sticking there.  He wrapped a slim towel around the top of his head to try to help it and ended up with a shot for the pain. Whatever it was, it was good.  Sounds like this was a long time ago b/c his father drove him there.  Has not had a real bad one lately.  Has never been to a headache specialist.    Med refills not consistent with use of medications.  Has egg allergy--gets bloating and stomach upset and nausea that tastes like rotten eggs--winds up vomiting and it resolves.      Hypothyroidism: last tsh elevated at 6.58.    Past Medical History:  Diagnosis Date  . Anxiety   . Cancer ([place])    skin cancer  basel cell carcinoma  . Cholecystitis   . Controlled type 2 DM with peripheral circulatory disorder ([place]) 09/20/2014  . Coronary artery disease   . Depression   . Headache(784.0)   . HTN (hypertension)   . Hx of skin cancer, basal cell   . Hyperlipidemia   . Hypothyroidism   . PAD (peripheral artery disease) ([place]) 09/20/2014  . Restless leg syndrome     Past Surgical History:  Procedure Laterality Date  . BASAL CELL CARCINOMA EXCISION  2000   Dr,Arkin   . CARDIAC CATHETERIZATION  07/07/2011  . CHOLECYSTECTOMY  09/14/2011   Procedure: LAPAROSCOPIC CHOLECYSTECTOMY WITH INTRAOPERATIVE CHOLANGIOGRAM;  Surgeon: Gwenyth Ober, MD;  Location: [facility];  Service: General;  Laterality: N/A;  . CORONARY ARTERY BYPASS GRAFT  07/10/2011   Procedure: CORONARY ARTERY BYPASS GRAFTING (CABG);  Surgeon: Ivin Poot, MD;  Location: [facility];  Service: Open Heart Surgery;  Laterality: N/A;  . EXCISIONAL HEMORRHOIDECTOMY  1997  . HEMORROIDECTOMY  2000  . LEFT HEART CATHETERIZATION WITH CORONARY ANGIOGRAM N/A 07/07/2011   Procedure: LEFT HEART CATHETERIZATION WITH CORONARY ANGIOGRAM;  Surgeon: Burnell Blanks, MD;  Location: [facility] CATH LAB;  Service: Cardiovascular;  Laterality: N/A;    Allergies  Allergen Reactions  . Eggs Or Egg-Derived Products Other (See Comments)     Bloating, nausea, and no rash   . Adhesive [Tape] Swelling    Allergies as of 01/08/2017      Reactions   Eggs Or Egg-derived Products Other (See Comments)   Bloating, nausea, and no rash    Adhesive [tape] Swelling      Medication List       Accurate as of 01/08/17 11:55 AM. Always use your most recent med list.          aspirin 325 MG EC tablet Take 1 tablet (325 mg total) by mouth daily.   [facility] FISH OIL 1200 MG Caps One twice daily to lower triglycerides   diazepam 5 MG tablet Commonly known as:  VALIUM TAKE 1 TABLET BY MOUTH FOUR TIMES A DAY AS NEEDED FOR NERVES   imipramine 25 MG tablet Commonly known as:  TOFRANIL TAKE 3 TABLETS BY MOUTH EVERY MORNING AND 4 TABLET AT BEDTIME   levothyroxine 112 MCG tablet Commonly known as:  SYNTHROID, LEVOTHROID Take 1 tablet (112 mcg total) by mouth daily.   metFORMIN 500 MG tablet Commonly known as:  GLUCOPHAGE Take 1 tablet (500 mg total) by mouth 2 (two) times daily with a meal.   metoprolol tartrate 25 MG tablet Commonly known as:  LOPRESSOR TAKE ONE TABLET BY MOUTH TWICE DAILY   mometasone 0.1 % cream Commonly known as:  ELOCON Apply 1 application topically daily. For eczema   NITROSTAT 0.4 MG SL tablet Generic drug:  nitroGLYCERIN Place 0.4 mg under the tongue every 5 (five) minutes as needed. Reported on 10/26/2015   pentazocine-naloxone 50-0.5 MG tablet Commonly known as:  TALWIN NX Take 1 tablet by mouth every 4 (four) hours as needed for pain.   polyethylene glycol packet Commonly known as:  MIRALAX / GLYCOLAX Take 17 g by mouth daily as needed (constipation). Reported on 10/26/2015   simvastatin 20 MG tablet Commonly known as:  ZOCOR TAKE ONE TABLET BY MOUTH ONCE DAILY FOR CHOLESTEROL       Review of Systems:  Review of Systems  Constitutional: Negative for chills, fever and malaise/fatigue.  HENT: Negative for congestion and hearing loss.   Eyes: Negative for blurred vision.  Respiratory:  Negative for cough and shortness of breath.   Cardiovascular: Negative for chest pain, palpitations and leg swelling.  Gastrointestinal: Negative for abdominal pain, blood in stool, constipation and melena.  Genitourinary: Negative for dysuria.  Musculoskeletal: Negative for falls.  Skin: Negative for itching and rash.  Neurological: Positive for headaches. Negative for dizziness and loss of consciousness.  Psychiatric/Behavioral: Positive for depression and memory loss. The patient is nervous/anxious.  Health Maintenance  Topic Date Due  . Hepatitis C Screening  1950-09-05  . OPHTHALMOLOGY EXAM  02/11/1961  . HIV Screening  02/11/1966  . FOOT EXAM  09/16/2015  . COLONOSCOPY  04/30/2017 (Originally 05/04/2013)  . HEMOGLOBIN A1C  07/02/2017  . URINE MICROALBUMIN  08/28/2017  . TETANUS/TDAP  06/26/2021  . PNA vac Low Risk Adult  Completed  . INFLUENZA VACCINE  Excluded    Physical Exam: Vitals:   01/08/17 1101  BP: 138/80  Pulse: 94  Temp: 97.8 F (36.6 C)  TempSrc: Oral  SpO2: 97%  Weight: 192 lb (87.1 kg)  Height: 5\' 10"  (1.778 m)   Body mass index is 27.55 kg/m. Physical Exam  Constitutional: He is oriented to person, place, and time. He appears well-developed and well-nourished. No distress.  HENT:  Head: Normocephalic and atraumatic.  Cardiovascular: Normal rate, regular rhythm, normal heart sounds and intact distal pulses.   Pulmonary/Chest: Effort normal and breath sounds normal. No respiratory distress.  Abdominal: Soft. Bowel sounds are normal. He exhibits no distension. There is no tenderness.  Musculoskeletal: Normal range of motion.  Neurological: He is alert and oriented to person, place, and time.  Slight tremor noted right hand  Skin: Skin is warm and dry. Capillary refill takes less than 2 seconds. He is not diaphoretic.  Psychiatric: He has a normal mood and affect.    Labs reviewed: Basic Metabolic Panel:  Recent Labs  04/17/16 0928  05/31/16 1930 08/28/16 0819 01/02/17 0848  NA 139 137 142  142 139  K 4.3 4.4 4.4  4.4 4.1  CL 100 96* 104  104 100  CO2 28 32 26  26 23   GLUCOSE 140* 213* 161*  161* 138*  BUN 14 22* 11  11 17   CREATININE 1.05 1.27* 1.25  1.25 1.17  CALCIUM 9.4 9.3 9.3  9.3 9.1  TSH 4.60*  --  6.58*  --    Liver Function Tests:  Recent Labs  04/17/16 0928 08/28/16 0819  AST 16 13  ALT 13 12  ALKPHOS 93 113  BILITOT 0.6 0.5  PROT 7.8 7.7  ALBUMIN 4.6 4.5   No results for input(s): LIPASE, AMYLASE in the last 8760 hours. No results for input(s): AMMONIA in the last 8760 hours. CBC:  Recent Labs  05/31/16 1930  WBC 10.4  NEUTROABS 5.2  HGB 15.9  HCT 46.7  MCV 89.6  PLT 126*   Lipid Panel:  Recent Labs  04/17/16 0928  CHOL 166  HDL 34*  LDLCALC 84  TRIG 242*  CHOLHDL 4.9   Lab Results  Component Value Date   HGBA1C 6.7 (H) 01/02/2017    Assessment/Plan 1. Hypertensive heart disease without CHF - bp at goal with current therapy - CBC with Differential/Platelet; Future - COMPLETE METABOLIC PANEL WITH GFR; Future  2. Recurrent major depressive disorder, in full remission ([place]) -ongoing -continues on TCA and valium as prescribed by previous PCP, Dr. Nyoka Cowden -would favor transitioning him to alternative therapy safer in his age group now that he's 55 +  3. Controlled type 2 DM with peripheral circulatory disorder (HCC) -well controlled, does not check cbgs, cont metformin therapy, renal function stable - Hemoglobin A1c; Future - Lipid panel; Future  4. Hypothyroidism, unspecified type - last few TSHs elevated, but not redone this time, will check tsh before next appt -may need increase in medication unless it's b/c he's missing doses--CMA reported he was confused about his meds - TSH; Future  5. Coronary artery  disease due to lipid rich plaque -continue lopressor, zocor, asa 325mg , prn ntg which he fortunately has not needed - Lipid panel; Future  6.  Chronic tension-type headache, not intractable - ongoing, none very recently, but  -talwin was not refilled today for him--he did not need it, but his wife also takes it -he was agreeable to an AMB referral to headache clinic for a workup of his headaches which he's had for years and reports are often not relieved with the talwin which I didn't know was even made anymore--would not recommend as a chronic headache medication  Refused flu shot due to egg allergy  Labs/tests ordered:   Orders Placed This Encounter  Procedures  . CBC with Differential/Platelet    Standing Status:   Future    Standing Expiration Date:   09/07/2017  . COMPLETE METABOLIC PANEL WITH GFR    Standing Status:   Future    Standing Expiration Date:   09/07/2017  . Hemoglobin A1c    Standing Status:   Future    Standing Expiration Date:   09/07/2017  . Lipid panel    Standing Status:   Future    Standing Expiration Date:   09/07/2017  . TSH    Standing Status:   Future    Standing Expiration Date:   09/07/2017  . AMB referral to headache clinic    Referral Priority:   Routine    Referral Type:   Consultation    Number of Visits Requested:   1   Next appt:  4 mos med mgt with labs before, headache clinic appt  Melaine Mcphee L. Letina Luckett, D.O. [facility] [address], [place] [address] Cell Phone (Mon-Fri 8am-5pm):  [phone] On Call:  [phone] & follow prompts after 5pm & weekends Office Phone:  [phone] Office Fax:  [phone]

## 2017-01-12 ENCOUNTER — Other Ambulatory Visit: Payer: Self-pay | Admitting: *Deleted

## 2017-01-12 DIAGNOSIS — G44209 Tension-type headache, unspecified, not intractable: Secondary | ICD-10-CM

## 2017-01-12 MED ORDER — DIAZEPAM 5 MG PO TABS: 5.0000 mg | ORAL_TABLET | Freq: Four times a day (QID) | ORAL | 0 refills | Status: DC | PRN

## 2017-01-12 NOTE — Telephone Encounter (Signed)
Wife called and stated that [facility] does not have patient's Valium and needs Rx called to [facility]. I called [facility] and confirmed and they do not have it and will expect it on Monday. I told them to disregard our Rx and that I would call it in to [facility]. Agreed. I called [facility] and spoke with pharmacist, she will get Rx ready for pick up. Patient notified.

## 2017-02-21 ENCOUNTER — Other Ambulatory Visit: Payer: Self-pay | Admitting: Internal Medicine

## 2017-02-21 DIAGNOSIS — G44209 Tension-type headache, unspecified, not intractable: Secondary | ICD-10-CM

## 2017-02-21 NOTE — Telephone Encounter (Signed)
rx called into pharmacy

## 2017-03-07 ENCOUNTER — Other Ambulatory Visit: Payer: Self-pay | Admitting: Internal Medicine

## 2017-03-07 DIAGNOSIS — G44209 Tension-type headache, unspecified, not intractable: Secondary | ICD-10-CM

## 2017-03-13 ENCOUNTER — Telehealth: Payer: Self-pay | Admitting: Internal Medicine

## 2017-03-13 NOTE — Telephone Encounter (Signed)
FYI, I called patient several times to follow up on this referral.  When I spoke with patients wife today she stated they have both left the practice and patient does not wish that we pursue this referral.  She stated patient is scheduled with his new doctor on the 21st.   (Referral in-basket message sent to provider)

## 2017-03-19 DIAGNOSIS — F331 Major depressive disorder, recurrent, moderate: Secondary | ICD-10-CM | POA: Insufficient documentation

## 2017-03-19 DIAGNOSIS — F411 Generalized anxiety disorder: Secondary | ICD-10-CM | POA: Insufficient documentation

## 2017-03-19 DIAGNOSIS — H903 Sensorineural hearing loss, bilateral: Secondary | ICD-10-CM | POA: Insufficient documentation

## 2017-03-19 DIAGNOSIS — I10 Essential (primary) hypertension: Secondary | ICD-10-CM | POA: Insufficient documentation

## 2017-05-08 ENCOUNTER — Other Ambulatory Visit: Payer: Medicare Other

## 2017-05-10 ENCOUNTER — Ambulatory Visit: Payer: Medicare Other | Admitting: Internal Medicine

## 2017-08-04 ENCOUNTER — Emergency Department (HOSPITAL_COMMUNITY): Payer: Medicare Other

## 2017-08-04 ENCOUNTER — Emergency Department (HOSPITAL_COMMUNITY)
Admission: EM | Admit: 2017-08-04 | Discharge: 2017-08-04 | Disposition: A | Discharge status: Home / Self Care | Payer: Medicare Other | Attending: Emergency Medicine | Admitting: Emergency Medicine

## 2017-08-04 ENCOUNTER — Encounter (HOSPITAL_COMMUNITY): Payer: Self-pay

## 2017-08-04 ENCOUNTER — Other Ambulatory Visit: Payer: Self-pay

## 2017-08-04 DIAGNOSIS — I10 Essential (primary) hypertension: Secondary | ICD-10-CM | POA: Diagnosis not present

## 2017-08-04 DIAGNOSIS — Z85828 Personal history of other malignant neoplasm of skin: Secondary | ICD-10-CM | POA: Diagnosis not present

## 2017-08-04 DIAGNOSIS — E039 Hypothyroidism, unspecified: Secondary | ICD-10-CM | POA: Insufficient documentation

## 2017-08-04 DIAGNOSIS — Z951 Presence of aortocoronary bypass graft: Secondary | ICD-10-CM | POA: Insufficient documentation

## 2017-08-04 DIAGNOSIS — Z87891 Personal history of nicotine dependence: Secondary | ICD-10-CM | POA: Diagnosis not present

## 2017-08-04 DIAGNOSIS — N132 Hydronephrosis with renal and ureteral calculous obstruction: Secondary | ICD-10-CM | POA: Insufficient documentation

## 2017-08-04 DIAGNOSIS — E119 Type 2 diabetes mellitus without complications: Secondary | ICD-10-CM | POA: Diagnosis not present

## 2017-08-04 DIAGNOSIS — I2510 Atherosclerotic heart disease of native coronary artery without angina pectoris: Secondary | ICD-10-CM | POA: Insufficient documentation

## 2017-08-04 DIAGNOSIS — M545 Low back pain: Secondary | ICD-10-CM | POA: Diagnosis present

## 2017-08-04 DIAGNOSIS — R319 Hematuria, unspecified: Secondary | ICD-10-CM | POA: Diagnosis not present

## 2017-08-04 DIAGNOSIS — N390 Urinary tract infection, site not specified: Secondary | ICD-10-CM | POA: Diagnosis not present

## 2017-08-04 DIAGNOSIS — N200 Calculus of kidney: Secondary | ICD-10-CM | POA: Diagnosis not present

## 2017-08-04 LAB — URINALYSIS, ROUTINE W REFLEX MICROSCOPIC
BACTERIA UA: NONE SEEN
BILIRUBIN URINE: NEGATIVE
Glucose, UA: 50 mg/dL — AB
KETONES UR: NEGATIVE mg/dL
Leukocytes, UA: NEGATIVE
Nitrite: POSITIVE — AB
PH: 5 (ref 5.0–8.0)
PROTEIN: 100 mg/dL — AB
Specific Gravity, Urine: 1.02 (ref 1.005–1.030)

## 2017-08-04 LAB — COMPREHENSIVE METABOLIC PANEL
ALBUMIN: 4.4 g/dL (ref 3.5–5.0)
ALT: 18 U/L (ref 17–63)
AST: 27 U/L (ref 15–41)
Alkaline Phosphatase: 93 U/L (ref 38–126)
Anion gap: 13 (ref 5–15)
BUN: 13 mg/dL (ref 6–20)
CHLORIDE: 98 mmol/L — AB (ref 101–111)
CO2: 26 mmol/L (ref 22–32)
Calcium: 9.3 mg/dL (ref 8.9–10.3)
Creatinine, Ser: 1.25 mg/dL — ABNORMAL HIGH (ref 0.61–1.24)
GFR calc Af Amer: >60 mL/min (ref >60)
GFR, EST NON AFRICAN AMERICAN: 58 mL/min — AB (ref >60)
GLUCOSE: 179 mg/dL — AB (ref 65–99)
POTASSIUM: 4 mmol/L (ref 3.5–5.1)
Sodium: 137 mmol/L (ref 135–145)
Total Bilirubin: 0.9 mg/dL (ref 0.3–1.2)
Total Protein: 8.2 g/dL — ABNORMAL HIGH (ref 6.5–8.1)

## 2017-08-04 LAB — CBC WITH DIFFERENTIAL/PLATELET
BASOS ABS: 0 10*3/uL (ref 0.0–0.1)
BASOS PCT: 0 %
EOS PCT: 0 %
Eosinophils Absolute: 0 10*3/uL (ref 0.0–0.7)
HEMATOCRIT: 44 % (ref 39.0–52.0)
Hemoglobin: 14.5 g/dL (ref 13.0–17.0)
LYMPHS PCT: 15 %
Lymphs Abs: 1.4 10*3/uL (ref 0.7–4.0)
MCH: 29.1 pg (ref 26.0–34.0)
MCHC: 33 g/dL (ref 30.0–36.0)
MCV: 88.2 fL (ref 78.0–100.0)
Monocytes Absolute: 0.7 10*3/uL (ref 0.1–1.0)
Monocytes Relative: 7 %
NEUTROS ABS: 7.2 10*3/uL (ref 1.7–7.7)
Neutrophils Relative %: 78 %
PLATELETS: 167 10*3/uL (ref 150–400)
RBC: 4.99 MIL/uL (ref 4.22–5.81)
RDW: 13.9 % (ref 11.5–15.5)
WBC: 9.2 10*3/uL (ref 4.0–10.5)

## 2017-08-04 MED ORDER — MORPHINE SULFATE (PF) 4 MG/ML IV SOLN
6.0000 mg | Freq: Once | INTRAVENOUS | Status: AC
  Administered 2017-08-04: 6 mg via INTRAVENOUS
  Filled 2017-08-04: qty 2

## 2017-08-04 MED ORDER — ONDANSETRON HCL 4 MG/2ML IJ SOLN
4.0000 mg | Freq: Once | INTRAMUSCULAR | Status: AC
  Administered 2017-08-04: 4 mg via INTRAVENOUS
  Filled 2017-08-04: qty 2

## 2017-08-04 MED ORDER — LACTATED RINGERS IV BOLUS
1000.0000 mL | Freq: Once | INTRAVENOUS | Status: AC
  Administered 2017-08-04: 1000 mL via INTRAVENOUS

## 2017-08-04 MED ORDER — CEPHALEXIN 500 MG PO CAPS: 500.0000 mg | ORAL_CAPSULE | Freq: Four times a day (QID) | ORAL | 0 refills | Status: DC

## 2017-08-04 MED ORDER — HYDROCODONE-ACETAMINOPHEN 5-325 MG PO TABS
2.0000 | ORAL_TABLET | Freq: Once | ORAL | Status: AC
  Administered 2017-08-04: 2 via ORAL
  Filled 2017-08-04: qty 2

## 2017-08-04 MED ORDER — SODIUM CHLORIDE 0.9 % IV SOLN
1.0000 g | Freq: Once | INTRAVENOUS | Status: AC
  Administered 2017-08-04: 1 g via INTRAVENOUS
  Filled 2017-08-04: qty 10

## 2017-08-04 MED ORDER — KETOROLAC TROMETHAMINE 30 MG/ML IJ SOLN
15.0000 mg | Freq: Once | INTRAMUSCULAR | Status: AC
  Administered 2017-08-04: 15 mg via INTRAVENOUS
  Filled 2017-08-04: qty 1

## 2017-08-04 MED ORDER — TAMSULOSIN HCL 0.4 MG PO CAPS
0.4000 mg | ORAL_CAPSULE | Freq: Every day | ORAL | 0 refills | Status: DC
Start: 2017-08-04 — End: 2018-12-13

## 2017-08-04 MED ORDER — IBUPROFEN 400 MG PO TABS: 400.0000 mg | ORAL_TABLET | Freq: Four times a day (QID) | ORAL | 0 refills | Status: DC | PRN

## 2017-08-04 MED ORDER — ONDANSETRON HCL 4 MG PO TABS: 4.0000 mg | ORAL_TABLET | Freq: Three times a day (TID) | ORAL | 0 refills | Status: DC | PRN

## 2017-08-04 MED ORDER — TAMSULOSIN HCL 0.4 MG PO CAPS
0.4000 mg | ORAL_CAPSULE | Freq: Once | ORAL | Status: AC
  Administered 2017-08-04: 0.4 mg via ORAL
  Filled 2017-08-04: qty 1

## 2017-08-04 MED ORDER — HYDROCODONE-ACETAMINOPHEN 5-325 MG PO TABS: 1.0000 | ORAL_TABLET | Freq: Four times a day (QID) | ORAL | 0 refills | Status: DC | PRN

## 2017-08-04 NOTE — ED Provider Notes (Signed)
Emergency Department Provider Note   I have reviewed the triage vital signs and the nursing notes.   HISTORY  Chief Complaint Back Pain and Flank Pain   HPI Dean Cole is a 67 y.o. male with multiple medical problems as documented below the presents to the emergency department today secondary to what he feels is a recurrent kidney stone.  Patient and his wife provide a history that he had symptoms for a few days started about a week ago of dysuria.  He took some AZO and it seemed to have gotten better but then this morning he started having some back pain that radiated to flank pain.  This progressively worsened throughout the day he has been having some chills and nausea with it as well but no vomiting.  No diarrhea or constipation.  States the pain became severe and thus he presented here for further evaluation.  No fevers. No other associated or modifying symptoms.    Past Medical History:  Diagnosis Date  . Anxiety   . Cancer ([place])    skin cancer  basel cell carcinoma  . Cholecystitis   . Controlled type 2 DM with peripheral circulatory disorder ([place]) 09/20/2014  . Coronary artery disease   . Depression   . Headache(784.0)   . HTN (hypertension)   . Hx of skin cancer, basal cell   . Hyperlipidemia   . Hypothyroidism   . PAD (peripheral artery disease) ([place]) 09/20/2014  . Restless leg syndrome     Patient Active Problem List   Diagnosis Date Noted  . Hearing loss 04/19/2016  . PAD (peripheral artery disease) ([place]) 09/20/2014  . Controlled type 2 DM with peripheral circulatory disorder ([place]) 09/20/2014  . Depression 09/16/2014  . Erectile dysfunction 07/02/2014  . Type II diabetes mellitus with renal manifestations ([place]) 03/04/2014  . Eczema 12/25/2012  . Anxiety   . Dyslipidemia 02/29/2012  . Hypertensive heart disease without CHF   . Hypothyroidism   . Coronary artery disease   . Basal cell carcinoma   . Headache   . Restless leg syndrome   . Melanoma  of skin ([place]) 06/14/2001    Past Surgical History:  Procedure Laterality Date  . BASAL CELL CARCINOMA EXCISION  2000   Dr,Arkin   . CARDIAC CATHETERIZATION  07/07/2011  . CHOLECYSTECTOMY  09/14/2011   Procedure: LAPAROSCOPIC CHOLECYSTECTOMY WITH INTRAOPERATIVE CHOLANGIOGRAM;  Surgeon: Gwenyth Ober, MD;  Location: [facility];  Service: General;  Laterality: N/A;  . CORONARY ARTERY BYPASS GRAFT  07/10/2011   Procedure: CORONARY ARTERY BYPASS GRAFTING (CABG);  Surgeon: Ivin Poot, MD;  Location: [facility];  Service: Open Heart Surgery;  Laterality: N/A;  . EXCISIONAL HEMORRHOIDECTOMY  1997  . HEMORROIDECTOMY  2000  . LEFT HEART CATHETERIZATION WITH CORONARY ANGIOGRAM N/A 07/07/2011   Procedure: LEFT HEART CATHETERIZATION WITH CORONARY ANGIOGRAM;  Surgeon: Burnell Blanks, MD;  Location: [facility] CATH LAB;  Service: Cardiovascular;  Laterality: N/A;    Current Outpatient Rx  . Order #: [id number] Class: OTC  . Order #: [id number] Class: Print  . Order #: [id number] Class: Phone In  . Order #: [id number] Class: Print  . Order #: [id number] Class: Print  . Order #: [id number] Class: Normal  . Order #: [id number] Class: Normal  . Order #: [id number] Class: Normal  . Order #: [id number] Class: Normal  . Order #: [id number] Class: Normal  . Order #: [id number] Class: Historical Med  . Order #: [id number] Class: No Print  . Order #: [id number] Class: Print  . Order #: [id number] Class: Print  .  Order #: [id number] Class: Historical Med  . Order #: [id number] Class: Normal  . Order #: [id number] Class: Print    Allergies Eggs or egg-derived products and Adhesive [tape]  Family History  Problem Relation Age of Onset  . Coronary artery disease Father   . Suicidality Brother   . Coronary artery disease Paternal Grandfather     Social History Social History   Tobacco Use  . Smoking status: Former Smoker    Last attempt to quit: 08/08/1981    Years since quitting: 36.0  . Smokeless tobacco: Former Network engineer Use  Topics  . Alcohol use: No    Comment: former"  . Drug use: No    Review of Systems  All other systems negative except as documented in the HPI. All pertinent positives and negatives as reviewed in the HPI. ____________________________________________   PHYSICAL EXAM:  VITAL SIGNS: Blood pressure (!) 180/87, pulse 78, temperature 98.1 F (36.7 C), temperature source Oral, resp. rate 20, height 5\' 10"  (1.778 m), weight 84.4 kg (186 lb), SpO2 99 %.  Constitutional: Alert and oriented. Well appearing and in no acute distress. Eyes: Conjunctivae are normal. PERRL. EOMI. Head: Atraumatic. Nose: No congestion/rhinnorhea. Mouth/Throat: Mucous membranes are moist.  Oropharynx non-erythematous. Neck: No stridor.  No meningeal signs.   Cardiovascular: Normal rate, regular rhythm. Good peripheral circulation. Grossly normal heart sounds.   Respiratory: Normal respiratory effort.  No retractions. Lungs CTAB. Gastrointestinal: Soft and nontender. No distention.  Musculoskeletal: No lower extremity tenderness nor edema. No gross deformities of extremities. Neurologic:  Normal speech and language. No gross focal neurologic deficits are appreciated.  Skin:  Skin is warm, dry and intact. No rash noted.  ____________________________________________   LABS (all labs ordered are listed, but only abnormal results are displayed)  Labs Reviewed  COMPREHENSIVE METABOLIC PANEL - Abnormal; Notable for the following components:      Result Value   Chloride 98 (*)    Glucose, Bld 179 (*)    Creatinine, Ser 1.25 (*)    Total Protein 8.2 (*)    GFR calc non Af Amer 58 (*)    All other components within normal limits  URINALYSIS, ROUTINE W REFLEX MICROSCOPIC - Abnormal; Notable for the following components:   Color, Urine AMBER (*)    APPearance HAZY (*)    Glucose, UA 50 (*)    Hgb urine dipstick LARGE (*)    Protein, ur 100 (*)    Nitrite POSITIVE (*)    Squamous Epithelial / LPF 0-5 (*)     All other components within normal limits  URINE CULTURE  CBC WITH DIFFERENTIAL/PLATELET   ____________________________________________   RADIOLOGY  Ct Renal Stone Study  Result Date: 08/04/2017 CLINICAL DATA:  Lower back pain and left flank pain. EXAM: CT ABDOMEN AND PELVIS WITHOUT CONTRAST TECHNIQUE: Multidetector CT imaging of the abdomen and pelvis was performed following the standard protocol without IV contrast. COMPARISON:  09/11/2011 CT FINDINGS: Lower chest: Three-vessel coronary arteriosclerosis. Heart is top-normal in size without pericardial effusion. Subsegmental atelectasis at lung bases with mild peribronchial thickening. Hepatobiliary: Status post cholecystectomy. 8 mm hypodensity in the right hepatic lobe slightly larger than prior. This is too small further characterize but statistically consistent with a cyst or hemangioma no biliary dilatation. Pancreas: Normal Spleen: Normal Adrenals/Urinary Tract: Normal bilateral adrenal glands. Perinephric fat stranding of the left kidney with mild to moderate left-sided hydroureteronephrosis. There appears to be a calculus that has almost passed into the bladder measuring approximately 8 x 4 mm.  Probable inflammatory thickening of the interstitial portion of the left ureter from passage of this calculus. Nonobstructing 4 mm upper pole renal calculus is also noted on the left. Stomach/Bowel: Decompressed stomach. Mild distal esophageal thickening with trace fluid noted within. Mild esophagitis possibly from reflux is not excluded. Moderate stool burden within the colon. Normal appendix. Normal small bowel. Vascular/Lymphatic: Mild moderate aortoiliac and branch vessel atherosclerosis without aneurysm. Reproductive: Unremarkable prostate and seminal vesicles. Other: No free air nor free fluid. Tiny periumbilical fat containing hernia. Musculoskeletal: No acute or significant osseous findings. IMPRESSION: 1. Mild-to-moderate left-sided  hydroureteronephrosis and perinephric fat stranding secondary to an 8 x 4 mm calculus that has almost passed into the bladder. A nonobstructing 4 mm upper pole left renal calculus is also seen. 2. Three-vessel coronary arteriosclerosis. 3. Tiny cyst or hemangioma in the right hepatic lobe measuring 8 mm, slightly larger than on the 2013 CT. This is likely to represent a benign finding. 4. Trace fluid in the distal esophagus with mild distal esophageal thickening may reflect reflux related esophagitis. Electronically Signed   By: Ashley Royalty M.D.   On: 08/04/2017 02:00    ____________________________________________   PROCEDURES  Procedure(s) performed:   Procedures   ____________________________________________   INITIAL IMPRESSION / ASSESSMENT AND PLAN / ED COURSE  Pyelo vs kidney stone. Will eval with stone study. Considered AAA/dissection with his h/o of PAD/CAD and level of HTN however I suspect Bp is secondary to pain. Will treat, and ensure CT/UA has some evidence of cause.   Workup consistent with renal colic and likely UTI.  No evidence of sepsis so at this time will treat as an outpatient with urology follow-up.  Return here for worsening or changing symptoms.  Pertinent labs & imaging results that were available during my care of the patient were reviewed by me and considered in my medical decision making (see chart for details).  ____________________________________________  FINAL CLINICAL IMPRESSION(S) / ED DIAGNOSES  Final diagnoses:  Kidney stone  Urinary tract infection with hematuria, site unspecified     MEDICATIONS GIVEN DURING THIS VISIT:  Medications  morphine 4 MG/ML injection 6 mg (6 mg Intravenous Given 08/04/17 0109)  lactated ringers bolus 1,000 mL (0 mLs Intravenous Stopped 08/04/17 0329)  ondansetron (ZOFRAN) injection 4 mg (4 mg Intravenous Given 08/04/17 0110)  cefTRIAXone (ROCEPHIN) 1 g in sodium chloride 0.9 % 100 mL IVPB (0 g Intravenous Stopped  08/04/17 0305)  ketorolac (TORADOL) 30 MG/ML injection 15 mg (15 mg Intravenous Given 08/04/17 0229)  HYDROcodone-acetaminophen (NORCO/VICODIN) 5-325 MG per tablet 2 tablet (2 tablets Oral Given 08/04/17 0228)  tamsulosin (FLOMAX) capsule 0.4 mg (0.4 mg Oral Given 08/04/17 0229)     NEW OUTPATIENT MEDICATIONS STARTED DURING THIS VISIT:  New Prescriptions   CEPHALEXIN (KEFLEX) 500 MG CAPSULE    Take 1 capsule (500 mg total) by mouth 4 (four) times daily.   HYDROCODONE-ACETAMINOPHEN (NORCO/VICODIN) 5-325 MG TABLET    Take 1 tablet by mouth every 6 (six) hours as needed for severe pain.   IBUPROFEN (ADVIL,MOTRIN) 400 MG TABLET    Take 1 tablet (400 mg total) by mouth every 6 (six) hours as needed.   ONDANSETRON (ZOFRAN) 4 MG TABLET    Take 1 tablet (4 mg total) by mouth every 8 (eight) hours as needed for nausea or vomiting.   TAMSULOSIN (FLOMAX) 0.4 MG CAPS CAPSULE    Take 1 capsule (0.4 mg total) by mouth daily.    Note:  This note was prepared with  assistance of Systems analyst. Occasional wrong-word or sound-a-like substitutions may have occurred due to the inherent limitations of voice recognition software.   Callia Swim, Corene Cornea, MD 08/04/17 0330

## 2017-08-04 NOTE — ED Triage Notes (Signed)
Pt reports lower back pain with left flank pain at times.

## 2017-08-04 NOTE — ED Notes (Signed)
Pt to CT at this time.

## 2017-08-05 LAB — URINE CULTURE: Culture: NO GROWTH

## 2017-08-21 ENCOUNTER — Other Ambulatory Visit: Payer: Self-pay

## 2017-08-21 ENCOUNTER — Emergency Department (HOSPITAL_COMMUNITY)
Admission: EM | Admit: 2017-08-21 | Discharge: 2017-08-22 | Disposition: A | Discharge status: Home / Self Care | Payer: Medicare Other | Attending: Emergency Medicine | Admitting: Emergency Medicine

## 2017-08-21 ENCOUNTER — Encounter (HOSPITAL_COMMUNITY): Payer: Self-pay | Admitting: *Deleted

## 2017-08-21 ENCOUNTER — Emergency Department (HOSPITAL_COMMUNITY): Payer: Medicare Other

## 2017-08-21 DIAGNOSIS — E039 Hypothyroidism, unspecified: Secondary | ICD-10-CM | POA: Diagnosis not present

## 2017-08-21 DIAGNOSIS — S20469A Insect bite (nonvenomous) of unspecified back wall of thorax, initial encounter: Secondary | ICD-10-CM | POA: Diagnosis not present

## 2017-08-21 DIAGNOSIS — Z951 Presence of aortocoronary bypass graft: Secondary | ICD-10-CM | POA: Insufficient documentation

## 2017-08-21 DIAGNOSIS — Z7984 Long term (current) use of oral hypoglycemic drugs: Secondary | ICD-10-CM | POA: Diagnosis not present

## 2017-08-21 DIAGNOSIS — D696 Thrombocytopenia, unspecified: Secondary | ICD-10-CM | POA: Insufficient documentation

## 2017-08-21 DIAGNOSIS — I2510 Atherosclerotic heart disease of native coronary artery without angina pectoris: Secondary | ICD-10-CM | POA: Diagnosis not present

## 2017-08-21 DIAGNOSIS — F419 Anxiety disorder, unspecified: Secondary | ICD-10-CM | POA: Insufficient documentation

## 2017-08-21 DIAGNOSIS — Z87891 Personal history of nicotine dependence: Secondary | ICD-10-CM | POA: Insufficient documentation

## 2017-08-21 DIAGNOSIS — R509 Fever, unspecified: Secondary | ICD-10-CM | POA: Diagnosis not present

## 2017-08-21 DIAGNOSIS — S40862A Insect bite (nonvenomous) of left upper arm, initial encounter: Secondary | ICD-10-CM | POA: Diagnosis not present

## 2017-08-21 DIAGNOSIS — S40861A Insect bite (nonvenomous) of right upper arm, initial encounter: Secondary | ICD-10-CM | POA: Insufficient documentation

## 2017-08-21 DIAGNOSIS — Z85828 Personal history of other malignant neoplasm of skin: Secondary | ICD-10-CM | POA: Diagnosis not present

## 2017-08-21 DIAGNOSIS — I10 Essential (primary) hypertension: Secondary | ICD-10-CM | POA: Diagnosis not present

## 2017-08-21 DIAGNOSIS — W57XXXA Bitten or stung by nonvenomous insect and other nonvenomous arthropods, initial encounter: Secondary | ICD-10-CM | POA: Insufficient documentation

## 2017-08-21 DIAGNOSIS — F329 Major depressive disorder, single episode, unspecified: Secondary | ICD-10-CM | POA: Diagnosis not present

## 2017-08-21 DIAGNOSIS — R51 Headache: Secondary | ICD-10-CM | POA: Diagnosis not present

## 2017-08-21 DIAGNOSIS — E119 Type 2 diabetes mellitus without complications: Secondary | ICD-10-CM | POA: Insufficient documentation

## 2017-08-21 DIAGNOSIS — Y939 Activity, unspecified: Secondary | ICD-10-CM | POA: Diagnosis not present

## 2017-08-21 DIAGNOSIS — Z7982 Long term (current) use of aspirin: Secondary | ICD-10-CM | POA: Diagnosis not present

## 2017-08-21 DIAGNOSIS — Y998 Other external cause status: Secondary | ICD-10-CM | POA: Insufficient documentation

## 2017-08-21 DIAGNOSIS — Y929 Unspecified place or not applicable: Secondary | ICD-10-CM | POA: Diagnosis not present

## 2017-08-21 DIAGNOSIS — Z9049 Acquired absence of other specified parts of digestive tract: Secondary | ICD-10-CM | POA: Diagnosis not present

## 2017-08-21 DIAGNOSIS — D649 Anemia, unspecified: Secondary | ICD-10-CM | POA: Insufficient documentation

## 2017-08-21 DIAGNOSIS — Z79899 Other long term (current) drug therapy: Secondary | ICD-10-CM | POA: Insufficient documentation

## 2017-08-21 LAB — COMPREHENSIVE METABOLIC PANEL
ALBUMIN: 3.7 g/dL (ref 3.5–5.0)
ALT: 12 U/L — ABNORMAL LOW (ref 17–63)
ANION GAP: 13 (ref 5–15)
AST: 21 U/L (ref 15–41)
Alkaline Phosphatase: 99 U/L (ref 38–126)
BILIRUBIN TOTAL: 0.9 mg/dL (ref 0.3–1.2)
BUN: 14 mg/dL (ref 6–20)
CO2: 26 mmol/L (ref 22–32)
Calcium: 8.7 mg/dL — ABNORMAL LOW (ref 8.9–10.3)
Chloride: 98 mmol/L — ABNORMAL LOW (ref 101–111)
Creatinine, Ser: 1.01 mg/dL (ref 0.61–1.24)
GFR calc Af Amer: >60 mL/min (ref >60)
GFR calc non Af Amer: >60 mL/min (ref >60)
GLUCOSE: 126 mg/dL — AB (ref 65–99)
POTASSIUM: 3.1 mmol/L — AB (ref 3.5–5.1)
SODIUM: 137 mmol/L (ref 135–145)
TOTAL PROTEIN: 7.3 g/dL (ref 6.5–8.1)

## 2017-08-21 LAB — I-STAT CG4 LACTIC ACID, ED: Lactic Acid, Venous: 1.39 mmol/L (ref 0.5–1.9)

## 2017-08-21 MED ORDER — POTASSIUM CHLORIDE CRYS ER 20 MEQ PO TBCR
40.0000 meq | EXTENDED_RELEASE_TABLET | Freq: Once | ORAL | Status: AC
  Administered 2017-08-22: 40 meq via ORAL
  Filled 2017-08-21: qty 2

## 2017-08-21 MED ORDER — METOCLOPRAMIDE HCL 5 MG/ML IJ SOLN
10.0000 mg | Freq: Once | INTRAMUSCULAR | Status: AC
  Administered 2017-08-21: 10 mg via INTRAVENOUS
  Filled 2017-08-21: qty 2

## 2017-08-21 NOTE — ED Triage Notes (Addendum)
Wife states that the pt has had flu-like symptoms x 1 week. Wife reports pt began running a fever of 103.4 tonight around 8 pm and he has a very bad headache. Pt was given 1000 mg of tylenol at 8 pm. Pt has gotten multiple ticks off of him in the past 2 weeks.

## 2017-08-21 NOTE — ED Provider Notes (Addendum)
[facility] EMERGENCY DEPARTMENT Provider Note   CSN: [id number] Arrival date & time: 08/21/17  2115     History   Chief Complaint Chief Complaint  Patient presents with  . Headache    HPI Dean Cole is a 67 y.o. male.  HPI Complains of frontal headache gradual onset 2 or 3 weeks ago accompanied by cough.  Today patient had temperature 103.2 degrees at home.  He was treated with Tylenol prior to coming here which improved his fever but did not improve his headache.  Headache feels like migraines that he is had in the past.  No focal numbness or weakness no visual changes.  Other associated symptoms include cough.  No nausea or vomiting.  No rash.  He does admit to pulling 5 ticks off of his arms and trunk within the past few days.  Nothing makes symptoms better or worse.  No shortness of breath no other associated symptoms cough is steadily improved. Past Medical History:  Diagnosis Date  . Anxiety   . Cancer ([place])    skin cancer  basel cell carcinoma  . Cholecystitis   . Controlled type 2 DM with peripheral circulatory disorder ([place]) 09/20/2014  . Coronary artery disease   . Depression   . Headache(784.0)   . HTN (hypertension)   . Hx of skin cancer, basal cell   . Hyperlipidemia   . Hypothyroidism   . PAD (peripheral artery disease) ([place]) 09/20/2014  . Restless leg syndrome     Patient Active Problem List   Diagnosis Date Noted  . Hearing loss 04/19/2016  . PAD (peripheral artery disease) ([place]) 09/20/2014  . Controlled type 2 DM with peripheral circulatory disorder ([place]) 09/20/2014  . Depression 09/16/2014  . Erectile dysfunction 07/02/2014  . Type II diabetes mellitus with renal manifestations ([place]) 03/04/2014  . Eczema 12/25/2012  . Anxiety   . Dyslipidemia 02/29/2012  . Hypertensive heart disease without CHF   . Hypothyroidism   . Coronary artery disease   . Basal cell carcinoma   . Headache   . Restless leg syndrome   . Melanoma of skin ([place])  06/14/2001    Past Surgical History:  Procedure Laterality Date  . BASAL CELL CARCINOMA EXCISION  2000   Dr,Arkin   . CARDIAC CATHETERIZATION  07/07/2011  . CHOLECYSTECTOMY  09/14/2011   Procedure: LAPAROSCOPIC CHOLECYSTECTOMY WITH INTRAOPERATIVE CHOLANGIOGRAM;  Surgeon: Gwenyth Ober, MD;  Location: [place];  Service: General;  Laterality: N/A;  . CORONARY ARTERY BYPASS GRAFT  07/10/2011   Procedure: CORONARY ARTERY BYPASS GRAFTING (CABG);  Surgeon: Ivin Poot, MD;  Location: [place];  Service: Open Heart Surgery;  Laterality: N/A;  . EXCISIONAL HEMORRHOIDECTOMY  1997  . HEMORROIDECTOMY  2000  . LEFT HEART CATHETERIZATION WITH CORONARY ANGIOGRAM N/A 07/07/2011   Procedure: LEFT HEART CATHETERIZATION WITH CORONARY ANGIOGRAM;  Surgeon: Burnell Blanks, MD;  Location: [facility] CATH LAB;  Service: Cardiovascular;  Laterality: N/A;        Home Medications    Prior to Admission medications   Medication Sig Start Date End Date Taking? Authorizing Provider  aspirin EC 325 MG EC tablet Take 1 tablet (325 mg total) by mouth daily. 07/14/11  Yes Gold, Wayne E, PA-C  diazepam (VALIUM) 5 MG tablet TAKE 1 TABLET BY MOUTH FOUR TIMES A DAY AS NEEDED FOR NERVES 02/21/17  Yes Reed, Tiffany L, DO  HYDROcodone-acetaminophen (NORCO/VICODIN) 5-325 MG tablet Take 1 tablet by mouth every 6 (six) hours as needed for severe pain.  08/04/17  Yes Mesner, Corene Cornea, MD  ibuprofen (ADVIL,MOTRIN) 400 MG tablet Take 1 tablet (400 mg total) by mouth every 6 (six) hours as needed. 08/04/17  Yes Mesner, Corene Cornea, MD  imipramine (TOFRANIL) 25 MG tablet TAKE 3 TABLETS BY MOUTH EVERY MORNING AND 4 TABLET AT BEDTIME 11/20/16  Yes Reed, Tiffany L, DO  levothyroxine (SYNTHROID, LEVOTHROID) 112 MCG tablet Take 1 tablet (112 mcg total) by mouth daily. 08/30/16  Yes Estill Dooms, MD  metFORMIN (GLUCOPHAGE) 500 MG tablet Take 1 tablet (500 mg total) by mouth 2 (two) times daily with a meal. 08/12/15  Yes Mast, Man X, NP  NITROSTAT 0.4 MG SL  tablet Place 0.4 mg under the tongue every 5 (five) minutes as needed. Reported on 10/26/2015 07/06/11  Yes [provider]  Omega-3 Fatty Acids ([employer] FISH OIL) 1200 MG CAPS One twice daily to lower triglycerides 06/09/15  Yes Estill Dooms, MD  ondansetron (ZOFRAN) 4 MG tablet Take 1 tablet (4 mg total) by mouth every 8 (eight) hours as needed for nausea or vomiting. 08/04/17  Yes Mesner, Corene Cornea, MD  pentazocine-naloxone (TALWIN NX) 50-0.5 MG tablet Take 1 tablet by mouth every 4 (four) hours as needed for pain. 01/08/17  Yes Reed, Tiffany L, DO  polyethylene glycol (MIRALAX / GLYCOLAX) packet Take 17 g by mouth daily as needed (constipation). Reported on 10/26/2015   Yes [provider]  simvastatin (ZOCOR) 20 MG tablet TAKE ONE TABLET BY MOUTH ONCE DAILY FOR CHOLESTEROL 10/16/16  Yes Reed, Tiffany L, DO  tamsulosin (FLOMAX) 0.4 MG CAPS capsule Take 1 capsule (0.4 mg total) by mouth daily. 08/04/17  Yes Mesner, Corene Cornea, MD  cephALEXin (KEFLEX) 500 MG capsule Take 1 capsule (500 mg total) by mouth 4 (four) times daily. Patient not taking: Reported on 08/21/2017 08/04/17   Mesner, Corene Cornea, MD    Family History Family History  Problem Relation Age of Onset  . Coronary artery disease Father   . Suicidality Brother   . Coronary artery disease Paternal Grandfather     Social History Social History   Tobacco Use  . Smoking status: Former Smoker    Last attempt to quit: 08/08/1981    Years since quitting: 36.0  . Smokeless tobacco: Former Network engineer Use Topics  . Alcohol use: No    Comment: former"  . Drug use: No     Allergies   Eggs or egg-derived products and Adhesive [tape]   Review of Systems Review of Systems  Constitutional: Negative.   HENT: Negative.   Respiratory: Positive for cough.   Cardiovascular: Positive for chest pain.       Syncope  Gastrointestinal: Negative.   Musculoskeletal: Negative.   Skin: Positive for wound.       Multiple tick bites    Allergic/Immunologic: Positive for immunocompromised state.       Diabetic  Neurological: Positive for headaches.  Psychiatric/Behavioral: Negative.   All other systems reviewed and are negative.    Physical Exam Updated Vital Signs BP 139/85 (BP Location: Right Arm)   Pulse (!) 114   Temp 100.1 F (37.8 C) (Oral)   Resp 19   Ht 5\' 10"  (1.778 m)   Wt 84.4 kg (186 lb)   SpO2 95%   BMI 26.69 kg/m   Physical Exam  Constitutional: He appears well-developed and well-nourished.  HENT:  Head: Normocephalic and atraumatic.  Eyes: Pupils are equal, round, and reactive to light. Conjunctivae are normal.  Neck: Neck supple. No tracheal deviation  present. No thyromegaly present.  Cardiovascular: Normal rate and regular rhythm.  No murmur heard. Pulmonary/Chest: Effort normal and breath sounds normal.  Abdominal: Soft. Bowel sounds are normal. He exhibits no distension. There is no tenderness.  Musculoskeletal: Normal range of motion. He exhibits no edema or tenderness.  Neurological: He is alert. Coordination normal.  Skin: Skin is warm and dry. No rash noted.  Multiple tiny scabbed lesions on patient's arms and trunk where he had picked ticks off of him.  No surrounding redness or tenderness.  Psychiatric: He has a normal mood and affect.  Nursing note and vitals reviewed.    ED Treatments / Results  Labs (all labs ordered are listed, but only abnormal results are displayed) Labs Reviewed  COMPREHENSIVE METABOLIC PANEL  CBC WITH DIFFERENTIAL/PLATELET  I-STAT CG4 LACTIC ACID, ED   Results for orders placed or performed during the hospital encounter of 08/21/17  Comprehensive metabolic panel  Result Value Ref Range   Sodium 137 135 - 145 mmol/L   Potassium 3.1 (L) 3.5 - 5.1 mmol/L   Chloride 98 (L) 101 - 111 mmol/L   CO2 26 22 - 32 mmol/L   Glucose, Bld 126 (H) 65 - 99 mg/dL   BUN 14 6 - 20 mg/dL   Creatinine, Ser 1.01 0.61 - 1.24 mg/dL   Calcium 8.7 (L) 8.9 - 10.3  mg/dL   Total Protein 7.3 6.5 - 8.1 g/dL   Albumin 3.7 3.5 - 5.0 g/dL   AST 21 15 - 41 U/L   ALT 12 (L) 17 - 63 U/L   Alkaline Phosphatase 99 38 - 126 U/L   Total Bilirubin 0.9 0.3 - 1.2 mg/dL   GFR calc non Af Amer >60 >60 mL/min   GFR calc Af Amer >60 >60 mL/min   Anion gap 13 5 - 15  CBC with Differential/Platelet  Result Value Ref Range   WBC 4.3 4.0 - 10.5 K/uL   RBC 4.30 4.22 - 5.81 MIL/uL   Hemoglobin 12.5 (L) 13.0 - 17.0 g/dL   HCT 37.1 (L) 39.0 - 52.0 %   MCV 86.3 78.0 - 100.0 fL   MCH 29.1 26.0 - 34.0 pg   MCHC 33.7 30.0 - 36.0 g/dL   RDW 13.8 11.5 - 15.5 %   Platelets 104 (L) 150 - 400 K/uL   Neutrophils Relative % 67 %   Neutro Abs 2.9 1.7 - 7.7 K/uL   Lymphocytes Relative 25 %   Lymphs Abs 1.0 0.7 - 4.0 K/uL   Monocytes Relative 7 %   Monocytes Absolute 0.3 0.1 - 1.0 K/uL   Eosinophils Relative 1 %   Eosinophils Absolute 0.0 0.0 - 0.7 K/uL   Basophils Relative 0 %   Basophils Absolute 0.0 0.0 - 0.1 K/uL  I-Stat CG4 Lactic Acid, ED  Result Value Ref Range   Lactic Acid, Venous 1.39 0.5 - 1.9 mmol/L   Dg Chest 2 View  Result Date: 08/21/2017 CLINICAL DATA:  Flu-like symptoms for 1 week. Fever and headache tonight. History of diabetes, hypertension, heart disease, former smoker. EXAM: CHEST - 2 VIEW COMPARISON:  05/31/2016 FINDINGS: Linear scarring in the lung bases is similar to previous study. No airspace disease or consolidation. No blunting of costophrenic angles. No pneumothorax. Normal heart size and pulmonary vascularity. Mediastinal contours appear intact. Postoperative changes in the mediastinum and right upper quadrant. IMPRESSION: Linear scarring in the lung bases, similar to previous study. No consolidation or edema. Electronically Signed   By: Oren Beckmann.D.  On: 08/21/2017 23:16   Ct Renal Stone Study  Result Date: 08/04/2017 CLINICAL DATA:  Lower back pain and left flank pain. EXAM: CT ABDOMEN AND PELVIS WITHOUT CONTRAST TECHNIQUE:  Multidetector CT imaging of the abdomen and pelvis was performed following the standard protocol without IV contrast. COMPARISON:  09/11/2011 CT FINDINGS: Lower chest: Three-vessel coronary arteriosclerosis. Heart is top-normal in size without pericardial effusion. Subsegmental atelectasis at lung bases with mild peribronchial thickening. Hepatobiliary: Status post cholecystectomy. 8 mm hypodensity in the right hepatic lobe slightly larger than prior. This is too small further characterize but statistically consistent with a cyst or hemangioma no biliary dilatation. Pancreas: Normal Spleen: Normal Adrenals/Urinary Tract: Normal bilateral adrenal glands. Perinephric fat stranding of the left kidney with mild to moderate left-sided hydroureteronephrosis. There appears to be a calculus that has almost passed into the bladder measuring approximately 8 x 4 mm. Probable inflammatory thickening of the interstitial portion of the left ureter from passage of this calculus. Nonobstructing 4 mm upper pole renal calculus is also noted on the left. Stomach/Bowel: Decompressed stomach. Mild distal esophageal thickening with trace fluid noted within. Mild esophagitis possibly from reflux is not excluded. Moderate stool burden within the colon. Normal appendix. Normal small bowel. Vascular/Lymphatic: Mild moderate aortoiliac and branch vessel atherosclerosis without aneurysm. Reproductive: Unremarkable prostate and seminal vesicles. Other: No free air nor free fluid. Tiny periumbilical fat containing hernia. Musculoskeletal: No acute or significant osseous findings. IMPRESSION: 1. Mild-to-moderate left-sided hydroureteronephrosis and perinephric fat stranding secondary to an 8 x 4 mm calculus that has almost passed into the bladder. A nonobstructing 4 mm upper pole left renal calculus is also seen. 2. Three-vessel coronary arteriosclerosis. 3. Tiny cyst or hemangioma in the right hepatic lobe measuring 8 mm, slightly larger than on  the 2013 CT. This is likely to represent a benign finding. 4. Trace fluid in the distal esophagus with mild distal esophageal thickening may reflect reflux related esophagitis. Electronically Signed   By: Ashley Royalty M.D.   On: 08/04/2017 02:00   EKG None  Radiology Dg Chest 2 View  Result Date: 08/21/2017 CLINICAL DATA:  Flu-like symptoms for 1 week. Fever and headache tonight. History of diabetes, hypertension, heart disease, former smoker. EXAM: CHEST - 2 VIEW COMPARISON:  05/31/2016 FINDINGS: Linear scarring in the lung bases is similar to previous study. No airspace disease or consolidation. No blunting of costophrenic angles. No pneumothorax. Normal heart size and pulmonary vascularity. Mediastinal contours appear intact. Postoperative changes in the mediastinum and right upper quadrant. IMPRESSION: Linear scarring in the lung bases, similar to previous study. No consolidation or edema. Electronically Signed   By: Lucienne Capers M.D.   On: 08/21/2017 23:16    Procedures Procedures (including critical care time) Chest x-ray reviewed by me.  No signs of pneumonia Medications Ordered in ED Medications  metoCLOPramide (REGLAN) injection 10 mg (10 mg Intravenous Given 08/21/17 2247)    In light of multiple tick bites, fever will write prescription for doxycycline.  Lab work consistent with hypokalemia.  Lab work also consistent with mild thrombocytopenia and anemia.  No intervention needed.  Number cytopenia is chronic oral potassium supplementation ordered  11:45 AM take much improved after treatment with intravenous Reglan.  He is alert appropriate Glasgow Coma Score 15 Initial Impression / Assessment and Plan / ED Course  I have reviewed the triage vital signs and the nursing notes.  Pertinent labs & imaging results that were available during my care of the patient were reviewed by me  and considered in my medical decision making (see chart for details).    No signs of meningitis or  encephalitis.  Patient is not ill-appearing 12:25 AM he is alert ambulatory feels ready to go home not lightheaded on standing plan Tylenol for fever prescription doxycycline.  Follow-up with Dr. Redmond Pulling if still has fever in 3 days and return precautions given for feeling worse, lightheadedness, vomiting  Final Clinical Impressions(s) / ED Diagnoses  #1 febrile illness #2 bad headache #3 multiple tick bites #4anemia #5 thrombocytopenia Final diagnoses:  None   #6 hypokalemia ED Discharge Orders    None       Orlie Dakin, MD 08/22/17 5945    Orlie Dakin, MD 08/22/17 [phone]

## 2017-08-22 LAB — CBC WITH DIFFERENTIAL/PLATELET
BASOS ABS: 0 10*3/uL (ref 0.0–0.1)
Basophils Relative: 0 %
EOS ABS: 0 10*3/uL (ref 0.0–0.7)
Eosinophils Relative: 1 %
HCT: 37.5 % — ABNORMAL LOW (ref 39.0–52.0)
HEMOGLOBIN: 12.5 g/dL — AB (ref 13.0–17.0)
LYMPHS ABS: 1.1 10*3/uL (ref 0.7–4.0)
LYMPHS PCT: 25 %
MCH: 28.7 pg (ref 26.0–34.0)
MCHC: 33.3 g/dL (ref 30.0–36.0)
MCV: 86.2 fL (ref 78.0–100.0)
Monocytes Absolute: 0.4 10*3/uL (ref 0.1–1.0)
Monocytes Relative: 9 %
NEUTROS PCT: 65 %
Neutro Abs: 2.9 10*3/uL (ref 1.7–7.7)
Platelets: 103 10*3/uL — ABNORMAL LOW (ref 150–400)
RBC: 4.35 MIL/uL (ref 4.22–5.81)
RDW: 13.7 % (ref 11.5–15.5)
WBC: 4.4 10*3/uL (ref 4.0–10.5)

## 2017-08-22 MED ORDER — DOXYCYCLINE HYCLATE 100 MG PO TABS
100.0000 mg | ORAL_TABLET | Freq: Once | ORAL | Status: AC
Start: 2017-08-22 — End: 2017-08-22
  Administered 2017-08-22: 100 mg via ORAL
  Filled 2017-08-22: qty 1

## 2017-08-22 MED ORDER — ACETAMINOPHEN 500 MG PO TABS
1000.0000 mg | ORAL_TABLET | Freq: Once | ORAL | Status: AC
  Administered 2017-08-22: 1000 mg via ORAL
  Filled 2017-08-22: qty 2

## 2017-08-22 MED ORDER — DOXYCYCLINE HYCLATE 100 MG PO CAPS: 100.0000 mg | ORAL_CAPSULE | Freq: Two times a day (BID) | ORAL | 0 refills | Status: DC

## 2017-08-22 NOTE — Discharge Instructions (Addendum)
Take Tylenol every 4 hours as needed for headache or for  fever higher than 100.4.  It is not necessary to wake up at night to check your temperature.  Take the antibiotic as prescribed starting tomorrow.  See Dr. Redmond Pulling in the office if you still have fever in 3 days.Make sure that you drink at least six 8 ounce glasses of water each day in order to stay well-hydrated.  Wash the tick bite wounds daily with soap and water and check for infection meaning redness around the wounds, more pain, drainage from the wounds.  If you think the wounds might be getting infected, see Dr. Redmond Pulling earlier or return to the emergency department.  Return to the emergency department if you develop vomiting, lightheadedness or concerned for any reason

## 2017-08-23 ENCOUNTER — Other Ambulatory Visit: Payer: Self-pay | Admitting: Internal Medicine

## 2017-08-23 DIAGNOSIS — E039 Hypothyroidism, unspecified: Secondary | ICD-10-CM

## 2017-09-29 ENCOUNTER — Encounter (HOSPITAL_COMMUNITY): Payer: Self-pay | Admitting: Emergency Medicine

## 2017-09-29 ENCOUNTER — Other Ambulatory Visit: Payer: Self-pay

## 2017-09-29 ENCOUNTER — Emergency Department (HOSPITAL_COMMUNITY)
Admission: EM | Admit: 2017-09-29 | Discharge: 2017-09-29 | Disposition: A | Discharge status: Home / Self Care | Payer: Medicare HMO | Attending: Emergency Medicine | Admitting: Emergency Medicine

## 2017-09-29 DIAGNOSIS — Z85828 Personal history of other malignant neoplasm of skin: Secondary | ICD-10-CM | POA: Insufficient documentation

## 2017-09-29 DIAGNOSIS — Z7982 Long term (current) use of aspirin: Secondary | ICD-10-CM | POA: Diagnosis not present

## 2017-09-29 DIAGNOSIS — E119 Type 2 diabetes mellitus without complications: Secondary | ICD-10-CM | POA: Insufficient documentation

## 2017-09-29 DIAGNOSIS — I2510 Atherosclerotic heart disease of native coronary artery without angina pectoris: Secondary | ICD-10-CM | POA: Insufficient documentation

## 2017-09-29 DIAGNOSIS — F419 Anxiety disorder, unspecified: Secondary | ICD-10-CM | POA: Insufficient documentation

## 2017-09-29 DIAGNOSIS — Y9389 Activity, other specified: Secondary | ICD-10-CM | POA: Insufficient documentation

## 2017-09-29 DIAGNOSIS — S91202A Unspecified open wound of left great toe with damage to nail, initial encounter: Secondary | ICD-10-CM | POA: Insufficient documentation

## 2017-09-29 DIAGNOSIS — Y998 Other external cause status: Secondary | ICD-10-CM | POA: Diagnosis not present

## 2017-09-29 DIAGNOSIS — E039 Hypothyroidism, unspecified: Secondary | ICD-10-CM | POA: Insufficient documentation

## 2017-09-29 DIAGNOSIS — Z951 Presence of aortocoronary bypass graft: Secondary | ICD-10-CM | POA: Diagnosis not present

## 2017-09-29 DIAGNOSIS — Z87891 Personal history of nicotine dependence: Secondary | ICD-10-CM | POA: Diagnosis not present

## 2017-09-29 DIAGNOSIS — Y929 Unspecified place or not applicable: Secondary | ICD-10-CM | POA: Insufficient documentation

## 2017-09-29 DIAGNOSIS — S99922A Unspecified injury of left foot, initial encounter: Secondary | ICD-10-CM | POA: Diagnosis present

## 2017-09-29 DIAGNOSIS — Z79899 Other long term (current) drug therapy: Secondary | ICD-10-CM | POA: Insufficient documentation

## 2017-09-29 DIAGNOSIS — Z9049 Acquired absence of other specified parts of digestive tract: Secondary | ICD-10-CM | POA: Diagnosis not present

## 2017-09-29 DIAGNOSIS — Z7984 Long term (current) use of oral hypoglycemic drugs: Secondary | ICD-10-CM | POA: Insufficient documentation

## 2017-09-29 DIAGNOSIS — S91209A Unspecified open wound of unspecified toe(s) with damage to nail, initial encounter: Secondary | ICD-10-CM

## 2017-09-29 DIAGNOSIS — X58XXXA Exposure to other specified factors, initial encounter: Secondary | ICD-10-CM | POA: Insufficient documentation

## 2017-09-29 MED ORDER — CIPROFLOXACIN HCL 250 MG PO TABS
500.0000 mg | ORAL_TABLET | Freq: Once | ORAL | Status: DC
  Filled 2017-09-29: qty 2

## 2017-09-29 MED ORDER — TRAMADOL HCL 50 MG PO TABS
50.0000 mg | ORAL_TABLET | Freq: Once | ORAL | Status: DC
  Filled 2017-09-29: qty 1

## 2017-09-29 NOTE — Discharge Instructions (Addendum)
Area covered with a nonadherent dressing.   Apply antibiotic ointment.   Keep dressing to keep pressure on the wound.   Expect slow improvement.   Clean daily starting on day 2

## 2017-09-29 NOTE — ED Provider Notes (Signed)
[facility] EMERGENCY DEPARTMENT Provider Note   CSN: [id number] Arrival date & time: 09/29/17  1204     History   Chief Complaint Chief Complaint  Patient presents with  . Toe Pain    HPI Dean Cole is a 67 y.o. male.  Chief complaint is "I ripped my toenail off".  HPI 67 year old male.  Has a loose left great toe.  He was taking his socks off yesterday and when he pulled the sock off it pulled the nail off.  It is mainly bleeding since last night.  He presents for evaluation.  He takes aspirin but no boutique anticoagulants or antiplatelet agent  Past Medical History:  Diagnosis Date  . Anxiety   . Cancer ([place])    skin cancer  basel cell carcinoma  . Cholecystitis   . Controlled type 2 DM with peripheral circulatory disorder ([place]) 09/20/2014  . Coronary artery disease   . Depression   . Headache(784.0)   . HTN (hypertension)   . Hx of skin cancer, basal cell   . Hyperlipidemia   . Hypothyroidism   . PAD (peripheral artery disease) ([place]) 09/20/2014  . Restless leg syndrome     Patient Active Problem List   Diagnosis Date Noted  . Hearing loss 04/19/2016  . PAD (peripheral artery disease) ([place]) 09/20/2014  . Controlled type 2 DM with peripheral circulatory disorder ([place]) 09/20/2014  . Depression 09/16/2014  . Erectile dysfunction 07/02/2014  . Type II diabetes mellitus with renal manifestations ([place]) 03/04/2014  . Eczema 12/25/2012  . Anxiety   . Dyslipidemia 02/29/2012  . Hypertensive heart disease without CHF   . Hypothyroidism   . Coronary artery disease   . Basal cell carcinoma   . Headache   . Restless leg syndrome   . Melanoma of skin ([place]) 06/14/2001    Past Surgical History:  Procedure Laterality Date  . BASAL CELL CARCINOMA EXCISION  2000   Dr,Arkin   . CARDIAC CATHETERIZATION  07/07/2011  . CHOLECYSTECTOMY  09/14/2011   Procedure: LAPAROSCOPIC CHOLECYSTECTOMY WITH INTRAOPERATIVE CHOLANGIOGRAM;  Surgeon: Gwenyth Ober, MD;  Location: [place];   Service: General;  Laterality: N/A;  . CORONARY ARTERY BYPASS GRAFT  07/10/2011   Procedure: CORONARY ARTERY BYPASS GRAFTING (CABG);  Surgeon: Ivin Poot, MD;  Location: [place];  Service: Open Heart Surgery;  Laterality: N/A;  . EXCISIONAL HEMORRHOIDECTOMY  1997  . HEMORROIDECTOMY  2000  . LEFT HEART CATHETERIZATION WITH CORONARY ANGIOGRAM N/A 07/07/2011   Procedure: LEFT HEART CATHETERIZATION WITH CORONARY ANGIOGRAM;  Surgeon: Burnell Blanks, MD;  Location: [facility] CATH LAB;  Service: Cardiovascular;  Laterality: N/A;        Home Medications    Prior to Admission medications   Medication Sig Start Date End Date Taking? Authorizing Provider  aspirin EC 325 MG EC tablet Take 1 tablet (325 mg total) by mouth daily. 07/14/11   Gold, Wayne E, PA-C  diazepam (VALIUM) 5 MG tablet TAKE 1 TABLET BY MOUTH FOUR TIMES A DAY AS NEEDED FOR NERVES 02/21/17   Reed, Tiffany L, DO  imipramine (TOFRANIL) 25 MG tablet TAKE 3 TABLETS BY MOUTH EVERY MORNING AND 4 TABLET AT BEDTIME 11/20/16   Reed, Tiffany L, DO  levothyroxine (SYNTHROID, LEVOTHROID) 112 MCG tablet Take 1 tablet (112 mcg total) by mouth daily. 08/30/16   Estill Dooms, MD  metFORMIN (GLUCOPHAGE) 500 MG tablet Take 1 tablet (500 mg total) by mouth 2 (two) times daily with a meal. 08/12/15   Mast,  Man X, NP  NITROSTAT 0.4 MG SL tablet Place 0.4 mg under the tongue every 5 (five) minutes as needed. Reported on 10/26/2015 07/06/11   [provider]  Omega-3 Fatty Acids ([employer] FISH OIL) 1200 MG CAPS One twice daily to lower triglycerides 06/09/15   Estill Dooms, MD  ondansetron (ZOFRAN) 4 MG tablet Take 1 tablet (4 mg total) by mouth every 8 (eight) hours as needed for nausea or vomiting. 08/04/17   Mesner, Corene Cornea, MD  pentazocine-naloxone (TALWIN NX) 50-0.5 MG tablet Take 1 tablet by mouth every 4 (four) hours as needed for pain. 01/08/17   Reed, Tiffany L, DO  polyethylene glycol (MIRALAX / GLYCOLAX) packet Take 17 g by mouth daily as needed  (constipation). Reported on 10/26/2015    [provider]  simvastatin (ZOCOR) 20 MG tablet TAKE ONE TABLET BY MOUTH ONCE DAILY FOR CHOLESTEROL 10/16/16   Reed, Tiffany L, DO  tamsulosin (FLOMAX) 0.4 MG CAPS capsule Take 1 capsule (0.4 mg total) by mouth daily. 08/04/17   Mesner, Corene Cornea, MD    Family History Family History  Problem Relation Age of Onset  . Coronary artery disease Father   . Suicidality Brother   . Coronary artery disease Paternal Grandfather     Social History Social History   Tobacco Use  . Smoking status: Former Smoker    Last attempt to quit: 08/08/1981    Years since quitting: 36.1  . Smokeless tobacco: Former Network engineer Use Topics  . Alcohol use: No    Comment: former"  . Drug use: No     Allergies   Eggs or egg-derived products and Adhesive [tape]   Review of Systems Review of Systems  Constitutional: Negative for appetite change, chills, diaphoresis, fatigue and fever.  HENT: Negative for mouth sores, sore throat and trouble swallowing.   Eyes: Negative for visual disturbance.  Respiratory: Negative for cough, chest tightness, shortness of breath and wheezing.   Cardiovascular: Negative for chest pain.  Gastrointestinal: Negative for abdominal distention, abdominal pain, diarrhea, nausea and vomiting.  Endocrine: Negative for polydipsia, polyphagia and polyuria.  Genitourinary: Negative for dysuria, frequency and hematuria.  Musculoskeletal: Negative for gait problem.  Skin: Negative for color change, pallor and rash.  Neurological: Negative for dizziness, syncope, light-headedness and headaches.  Hematological: Does not bruise/bleed easily.  Psychiatric/Behavioral: Negative for behavioral problems and confusion.     Physical Exam Updated Vital Signs BP 128/84 (BP Location: Right Arm)   Pulse 77   Temp 98.4 F (36.9 C) (Temporal)   Resp 16   Ht 5\' 10"  (1.778 m)   Wt 88 kg (194 lb)   SpO2 96%   BMI 27.84 kg/m   Physical  Exam  Constitutional: He is oriented to person, place, and time. He appears well-developed and well-nourished. No distress.  HENT:  Head: Normocephalic.  Eyes: Pupils are equal, round, and reactive to light. Conjunctivae are normal. No scleral icterus.  Neck: Normal range of motion. Neck supple. No thyromegaly present.  Cardiovascular: Normal rate and regular rhythm. Exam reveals no gallop and no friction rub.  No murmur heard. Pulmonary/Chest: Effort normal and breath sounds normal. No respiratory distress. He has no wheezes. He has no rales.  Abdominal: Soft. Bowel sounds are normal. He exhibits no distension. There is no tenderness. There is no rebound.  Musculoskeletal: Normal range of motion.  Neurological: He is alert and oriented to person, place, and time.  Skin: Skin is warm and dry. No rash noted.  Complete  avulsion of the left nail including the matrix.  No lacerations.  No active bleeding.  No signs of cellulitis.  Non-tender to palpate the remainder of the toe.  Psychiatric: He has a normal mood and affect. His behavior is normal.     ED Treatments / Results  Labs (all labs ordered are listed, but only abnormal results are displayed) Labs Reviewed - No data to display  EKG None  Radiology No results found.  Procedures Procedures (including critical care time)  Medications Ordered in ED Medications  ciprofloxacin (CIPRO) tablet 500 mg (has no administration in time range)  traMADol (ULTRAM) tablet 50 mg (has no administration in time range)     Initial Impression / Assessment and Plan / ED Course  I have reviewed the triage vital signs and the nursing notes.  Pertinent labs & imaging results that were available during my care of the patient were reviewed by me and considered in my medical decision making (see chart for details).    Wash daily.  Basic wound care.  Nonadherent dressing.  Final Clinical Impressions(s) / ED Diagnoses   Final diagnoses:    Avulsion of toenail, initial encounter    ED Discharge Orders    None       Tanna Furry, MD 09/29/17 1238

## 2017-09-29 NOTE — ED Triage Notes (Signed)
Patient states he took his sock off and pulled off his right big toenail with the sock. States he had a fungus on toe and it was coming off anyway. Bleeding noted to right toe at triage.

## 2017-12-19 DIAGNOSIS — Z0000 Encounter for general adult medical examination without abnormal findings: Secondary | ICD-10-CM | POA: Insufficient documentation

## 2018-04-10 DIAGNOSIS — L84 Corns and callosities: Secondary | ICD-10-CM | POA: Insufficient documentation

## 2018-04-10 DIAGNOSIS — L602 Onychogryphosis: Secondary | ICD-10-CM | POA: Insufficient documentation

## 2018-05-15 DIAGNOSIS — L608 Other nail disorders: Secondary | ICD-10-CM | POA: Insufficient documentation

## 2018-05-23 DIAGNOSIS — L03039 Cellulitis of unspecified toe: Secondary | ICD-10-CM | POA: Insufficient documentation

## 2018-06-13 ENCOUNTER — Other Ambulatory Visit: Payer: Self-pay | Admitting: Urology

## 2018-06-13 DIAGNOSIS — C61 Malignant neoplasm of prostate: Secondary | ICD-10-CM

## 2018-06-26 ENCOUNTER — Encounter (HOSPITAL_COMMUNITY)
Admission: RE | Admit: 2018-06-26 | Discharge: 2018-06-26 | Disposition: A | Discharge status: Home / Self Care | Payer: Medicare HMO | Source: Ambulatory Visit | Attending: Urology | Admitting: Urology

## 2018-06-26 DIAGNOSIS — C61 Malignant neoplasm of prostate: Secondary | ICD-10-CM | POA: Diagnosis present

## 2018-06-26 MED ORDER — TECHNETIUM TC 99M MEDRONATE IV KIT
20.8000 | PACK | Freq: Once | INTRAVENOUS | Status: AC
  Administered 2018-06-26: 20.8 via INTRAVENOUS

## 2018-07-08 ENCOUNTER — Telehealth: Payer: Self-pay | Admitting: Radiation Oncology

## 2018-07-08 NOTE — Telephone Encounter (Signed)
Received voicemail message from patient's wife, Silva Bandy, requesting a return call. Phoned her back. She expresses a need to set up a consultation appointment for her husband since he has been diagnosed with prostate cancer. Explained that this Pryor Curia does not see a referral from Dr. Jeffie Pollock to Dr. Tammi Klippel in the Epic system. Transferred Silva Bandy to [place], scheduler, to set up consultation appointment.

## 2018-07-19 NOTE — Progress Notes (Addendum)
GU Location of Tumor / Histology: prostatic adenocarcinoma  If Prostate Cancer, Gleason Score is (4 + 3) and PSA is (17.1) on 04/23/2018. Prostate volume: 54.  04/16/2018 PSA 13.10  Mikki Santee had a PSA drawn one week before Christmas that was elevated. Repeat PSA one week later was even higher.   Biopsies of prostate (if applicable) revealed:    Past/Anticipated interventions by urology, if any: prostate biopsy, bone scan (negative), CT abd/pelvis (negative), referral for consideration of EXRT and ADT  Past/Anticipated interventions by medical oncology, if any: no  Weight changes, if any: no  Bowel/Bladder complaints, if any: Reports frequency. Reports intermittent dysuria. Denies hematuria. Reports post void dribble. Reports urinary hesitancy. AUA 9. SHIM 1.   Nausea/Vomiting, if any: no  Pain issues, if any:  no  SAFETY ISSUES:  Prior radiation? no  Pacemaker/ICD? no  Possible current pregnancy? no, male patient  Is the patient on methotrexate? no  Current Complaints / other details:  68 year old male. Married with one daughter. Explains the daughter is from a previous marriage. Presently they are raising their five special needs grandchildren.  Reports he has a step daughter who is a Marine scientist. Prostate cancer - father. Appointment with Jeffie Pollock for [place] scheduled for tomorrow. Understands he will receive Lupron as well.

## 2018-07-22 ENCOUNTER — Encounter: Payer: Self-pay | Admitting: Radiation Oncology

## 2018-07-22 ENCOUNTER — Telehealth: Payer: Self-pay | Admitting: Radiation Oncology

## 2018-07-22 NOTE — Telephone Encounter (Signed)
Phoned patient's home to confirm consultation appointment. Spoke with the patient's wife, Silva Bandy. Confirmed tomorrow's appointment and requested arrival at [facility] to allow time for CVD19 screening. Encouraged that the patient come alone to his appointment. Explained that if it is unsafe for the patient to come alone then only one person may accompany him. Explained that the person accompanying him must be at least 69. She denies that the patient or herself have been exposed to any one diagnosed with CVD19. She denies that the patient or herself have a fever, cough or shortness of breath. Encouraged her to have the patient bring his medications with him to the consultation appointment. Understanding of all reviewed was verbalized.

## 2018-07-22 NOTE — Progress Notes (Signed)
  Radiation Oncology         [phone] ________________________________  Name: Dean Cole MRN: [id number]  Date: 07/22/2018  DOB: 05/16/1950  Assessment of Upcoming Visit Note:  I called the patient today regarding the upcoming clinic appointment to assess whether an in person visit versus telehealth visit may be most appropriate.  I explained that the risks of pandemic COVID-19 transmission are causing [place] to transition some patients from clinic visits to telehealth or even telephone visits.  The patient was relieved to hear about this potential option.  They are willing to participate via telehealth.  Here is the relevant information regarding the visit to create the upcoming encounter.  Patient Home: Participants: The patient and his wife Silva Bandy Connection Type: Landline, listed in chart  Other Family Members Participants: Patient's daughter Lennox Grumbles RN Connection Type: Cell phone, [phone]  Translation Services Needed:  No      Sheral Apley. Tammi Klippel, M.D.

## 2018-07-23 ENCOUNTER — Ambulatory Visit
Admission: RE | Admit: 2018-07-23 | Discharge: 2018-07-23 | Disposition: A | Discharge status: Home / Self Care | Payer: Medicare HMO | Source: Ambulatory Visit | Attending: Radiation Oncology | Admitting: Radiation Oncology

## 2018-07-23 ENCOUNTER — Encounter: Payer: Self-pay | Admitting: Radiation Oncology

## 2018-07-23 ENCOUNTER — Other Ambulatory Visit: Payer: Self-pay

## 2018-07-23 DIAGNOSIS — G2581 Restless legs syndrome: Secondary | ICD-10-CM | POA: Insufficient documentation

## 2018-07-23 DIAGNOSIS — Z806 Family history of leukemia: Secondary | ICD-10-CM | POA: Insufficient documentation

## 2018-07-23 DIAGNOSIS — Z7984 Long term (current) use of oral hypoglycemic drugs: Secondary | ICD-10-CM | POA: Diagnosis not present

## 2018-07-23 DIAGNOSIS — E1151 Type 2 diabetes mellitus with diabetic peripheral angiopathy without gangrene: Secondary | ICD-10-CM | POA: Diagnosis not present

## 2018-07-23 DIAGNOSIS — Z85828 Personal history of other malignant neoplasm of skin: Secondary | ICD-10-CM | POA: Insufficient documentation

## 2018-07-23 DIAGNOSIS — Z79890 Hormone replacement therapy (postmenopausal): Secondary | ICD-10-CM | POA: Insufficient documentation

## 2018-07-23 DIAGNOSIS — C61 Malignant neoplasm of prostate: Secondary | ICD-10-CM | POA: Insufficient documentation

## 2018-07-23 DIAGNOSIS — Z888 Allergy status to other drugs, medicaments and biological substances status: Secondary | ICD-10-CM | POA: Insufficient documentation

## 2018-07-23 DIAGNOSIS — F329 Major depressive disorder, single episode, unspecified: Secondary | ICD-10-CM | POA: Diagnosis not present

## 2018-07-23 DIAGNOSIS — Z7982 Long term (current) use of aspirin: Secondary | ICD-10-CM | POA: Diagnosis not present

## 2018-07-23 DIAGNOSIS — Z91012 Allergy to eggs: Secondary | ICD-10-CM | POA: Insufficient documentation

## 2018-07-23 DIAGNOSIS — Z9049 Acquired absence of other specified parts of digestive tract: Secondary | ICD-10-CM | POA: Insufficient documentation

## 2018-07-23 DIAGNOSIS — Z87891 Personal history of nicotine dependence: Secondary | ICD-10-CM | POA: Insufficient documentation

## 2018-07-23 DIAGNOSIS — Z8042 Family history of malignant neoplasm of prostate: Secondary | ICD-10-CM | POA: Insufficient documentation

## 2018-07-23 DIAGNOSIS — F419 Anxiety disorder, unspecified: Secondary | ICD-10-CM | POA: Diagnosis not present

## 2018-07-23 DIAGNOSIS — I10 Essential (primary) hypertension: Secondary | ICD-10-CM | POA: Diagnosis not present

## 2018-07-23 DIAGNOSIS — Z951 Presence of aortocoronary bypass graft: Secondary | ICD-10-CM | POA: Diagnosis not present

## 2018-07-23 DIAGNOSIS — Z8249 Family history of ischemic heart disease and other diseases of the circulatory system: Secondary | ICD-10-CM | POA: Insufficient documentation

## 2018-07-23 DIAGNOSIS — I25810 Atherosclerosis of coronary artery bypass graft(s) without angina pectoris: Secondary | ICD-10-CM | POA: Insufficient documentation

## 2018-07-23 DIAGNOSIS — Z79899 Other long term (current) drug therapy: Secondary | ICD-10-CM | POA: Insufficient documentation

## 2018-07-23 HISTORY — DX: Malignant neoplasm of prostate: C61

## 2018-07-23 HISTORY — DX: Unspecified malignant neoplasm of skin, unspecified: C44.90

## 2018-07-23 NOTE — Progress Notes (Signed)
Radiation Oncology         [phone] ________________________________  Initial Outpatient Consultation - Conducted via telephone due to current COVID-19 concerns for limiting patient exposure  Name: Dean Cole MRN: [id number]  Date: 07/23/2018  DOB: 1951-02-26  VO:[id number], [name], MD  Irine Seal, MD   REFERRING PHYSICIAN: Irine Seal, MD  DIAGNOSIS: 68 y.o. 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    ICD-10-CM   1. Malignant neoplasm of prostate ([place]) C61     HISTORY OF PRESENT ILLNESS: Dean Cole a 68 y.o. male with a diagnosis of prostate cancer. He Cole an established patient at [facility] for kidney stones and on routine follow-up in December 2019, was noted to have an elevated PSA of 13.10.  The patient returned one week later for repeat PSA which was elevated further to 17.10.  Per Dr. Ralene Muskrat note, the patient has had no prior PSA's taken.  Accordingly, he was seen for evaluation in urology by Dr. Jeffie Pollock on 05/09/2018, and a digital rectal examination was performed at that time revealing left lobe firmness, but no nodules.  The patient proceeded to transrectal ultrasound with 12 biopsies of the prostate on 06/10/2018.  The prostate volume measured 54 cc.  Out of 12 core biopsies, 8 were positive.  The maximum Gleason score was 4+3, and this was seen in the left base lateral, left mid lateral, left base, left mid, left apex, right base, and right apex lateral.  Additionally, there was Gleason 3+4 disease seen in the left apex lateral.  Biopsies of prostate revealed:    The patient underwent staging work up with CT A/P on 06/26/2018 which did not show any evidence of visceral metastatic disease. His bone scan, performed same day, showed no evidence of osseous metastases.  The patient reviewed the biopsy results with his urologist and he has kindly been referred today for discussion of potential radiation  treatment options. He Cole scheduled to receive his first [place] injection with Dr. Jeffie Pollock on 07/24/18.   PREVIOUS RADIATION THERAPY: No  PAST MEDICAL HISTORY:  Past Medical History:  Diagnosis Date  . Anxiety   . Cancer ([place])    skin cancer  basel cell carcinoma  . Cholecystitis   . Controlled type 2 DM with peripheral circulatory disorder ([place]) 09/20/2014  . Coronary artery disease   . Depression   . Headache(784.0)   . HTN (hypertension)   . Hx of skin cancer, basal cell   . Hyperlipidemia   . Hypothyroidism   . PAD (peripheral artery disease) ([place]) 09/20/2014  . Prostate cancer ([place])   . Restless leg syndrome   . Skin cancer    melanoma      PAST SURGICAL HISTORY: Past Surgical History:  Procedure Laterality Date  . BASAL CELL CARCINOMA EXCISION  2000   Dr,Arkin   . CARDIAC CATHETERIZATION  07/07/2011  . CHOLECYSTECTOMY  09/14/2011   Procedure: LAPAROSCOPIC CHOLECYSTECTOMY WITH INTRAOPERATIVE CHOLANGIOGRAM;  Surgeon: Gwenyth Ober, MD;  Location: [facility];  Service: General;  Laterality: N/A;  . CORONARY ARTERY BYPASS GRAFT  07/10/2011   Procedure: CORONARY ARTERY BYPASS GRAFTING (CABG);  Surgeon: Ivin Poot, MD;  Location: [facility];  Service: Open Heart Surgery;  Laterality: N/A;  . EXCISIONAL HEMORRHOIDECTOMY  1997  . HEMORROIDECTOMY  2000  . LEFT HEART CATHETERIZATION WITH CORONARY ANGIOGRAM N/A 07/07/2011   Procedure: LEFT HEART CATHETERIZATION WITH CORONARY ANGIOGRAM;  Surgeon: Harrell Gave  Santina Evans, MD;  Location: [facility] CATH LAB;  Service: Cardiovascular;  Laterality: N/A;    FAMILY HISTORY:  Family History  Problem Relation Age of Onset  . Coronary artery disease Father   . Prostate cancer Father   . Leukemia Mother   . Suicidality Brother   . Coronary artery disease Paternal Grandfather   . Breast cancer Neg Hx   . Colon cancer Neg Hx   . Pancreatic cancer Neg Hx     SOCIAL HISTORY:  Social History   Socioeconomic History  . Marital status: Married     Spouse name: Not on file  . Number of children: 2  . Years of education: Not on file  . Highest education level: Not on file  Occupational History    Comment: retired  Scientific laboratory technician  . Financial resource strain: Not on file  . Food insecurity:    Worry: Not on file    Inability: Not on file  . Transportation needs:    Medical: Not on file    Non-medical: Not on file  Tobacco Use  . Smoking status: Former Smoker    Packs/day: 1.00    Years: 21.00    Pack years: 21.00    Types: Cigarettes    Last attempt to quit: 08/08/1981    Years since quitting: 36.9  . Smokeless tobacco: Former Network engineer and Sexual Activity  . Alcohol use: No    Comment: former  . Drug use: No  . Sexual activity: Yes  Lifestyle  . Physical activity:    Days per week: Not on file    Minutes per session: Not on file  . Stress: Not on file  Relationships  . Social connections:    Talks on phone: Not on file    Gets together: Not on file    Attends religious service: Not on file    Active member of club or organization: Not on file    Attends meetings of clubs or organizations: Not on file    Relationship status: Not on file  . Intimate partner violence:    Fear of current or ex partner: Not on file    Emotionally abused: Not on file    Physically abused: Not on file    Forced sexual activity: Not on file  Other Topics Concern  . Not on file  Social History Narrative  . Not on file    ALLERGIES: Eggs or egg-derived products and Adhesive [tape]  MEDICATIONS:  Current Outpatient Medications  Medication Sig Dispense Refill  . aspirin EC 325 MG EC tablet Take 1 tablet (325 mg total) by mouth daily. 30 tablet   . Calcium Carbonate-Vitamin D (CALCIUM-VITAMIN D3 PO) Take by mouth.    . diazepam (VALIUM) 5 MG tablet TAKE 1 TABLET BY MOUTH FOUR TIMES A DAY AS NEEDED FOR NERVES 120 tablet 0  . levothyroxine (SYNTHROID, LEVOTHROID) 125 MCG tablet     . metFORMIN (GLUCOPHAGE) 500 MG tablet Take 1  tablet (500 mg total) by mouth 2 (two) times daily with a meal. 60 tablet 2  . metoprolol succinate (TOPROL-XL) 25 MG 24 hr tablet TAKE 1 TABLET BY MOUTH EVERY DAY    . mometasone (ELOCON) 0.1 % cream     . pentazocine-naloxone (TALWIN NX) 50-0.5 MG tablet One tablet daily as needed for headache    . polyethylene glycol (MIRALAX / GLYCOLAX) packet Take by mouth.    . simvastatin (ZOCOR) 20 MG tablet TAKE ONE TABLET BY MOUTH ONCE  DAILY FOR CHOLESTEROL 90 tablet 1  . tamsulosin (FLOMAX) 0.4 MG CAPS capsule Take 1 capsule (0.4 mg total) by mouth daily. (Patient taking differently: Take 0.4 mg by mouth daily. Patient taking two tablets per day. Patient take one tablet in the AM and one tablet in the PM.) 30 capsule 0  . NITROSTAT 0.4 MG SL tablet Place 0.4 mg under the tongue every 5 (five) minutes as needed. Reported on 10/26/2015    . ondansetron (ZOFRAN-ODT) 4 MG disintegrating tablet      No current facility-administered medications for this encounter.     REVIEW OF SYSTEMS:  On review of systems, the patient reports that he Cole doing well overall. He denies any chest pain, shortness of breath, cough, fevers, chills, night sweats, or unintended weight changes. He denies abdominal pain, nausea or vomiting. He reports baseline constipation for which he regularly takes Miralax. He denies any new musculoskeletal or joint aches or pains. His IPSS was 9, indicating moderate urinary symptoms with frequency, intermittent dysuria, and post-void dribble. He denies hematuria. His SHIM was 1, indicating he does have severe erectile dysfunction. A complete review of systems Cole obtained and Cole otherwise negative.    PHYSICAL EXAM: Deferred due to virtual consult. Wt Readings from Last 3 Encounters:  09/29/17 194 lb (88 kg)  08/21/17 186 lb (84.4 kg)  08/04/17 186 lb (84.4 kg)   Temp Readings from Last 3 Encounters:  09/29/17 98.4 F (36.9 C) (Temporal)  08/22/17 (!) 100.5 F (38.1 C)  08/04/17 98.3 F  (36.8 C) (Oral)   BP Readings from Last 3 Encounters:  09/29/17 128/84  08/22/17 128/66  08/04/17 (!) 161/83   Pulse Readings from Last 3 Encounters:  09/29/17 77  08/22/17 96  08/04/17 77   Pain Assessment Pain Score: 0-No pain/10   LABORATORY DATA:  Lab Results  Component Value Date   WBC 4.4 08/21/2017   HGB 12.5 (L) 08/21/2017   HCT 37.5 (L) 08/21/2017   MCV 86.2 08/21/2017   PLT 103 (L) 08/21/2017   Lab Results  Component Value Date   NA 137 08/21/2017   K 3.1 (L) 08/21/2017   CL 98 (L) 08/21/2017   CO2 26 08/21/2017   Lab Results  Component Value Date   ALT 12 (L) 08/21/2017   AST 21 08/21/2017   ALKPHOS 99 08/21/2017   BILITOT 0.9 08/21/2017     RADIOGRAPHY: Nm Bone Scan Whole Body  Result Date: 06/27/2018 CLINICAL DATA:  Prostate cancer. EXAM: NUCLEAR MEDICINE WHOLE BODY BONE SCAN TECHNIQUE: Whole body anterior and posterior images were obtained approximately 3 hours after intravenous injection of radiopharmaceutical. RADIOPHARMACEUTICALS:  20.8 mCi Technetium-51m MDP IV COMPARISON:  CT 06/26/2018. FINDINGS: Bilateral renal function excretion. No focal bony abnormality identified. Mild increased activity noted about the right knee most likely degenerative. IMPRESSION: Mild increased activity noted about the right knee, most likely degenerative. Exam otherwise unremarkable. Electronically Signed   By: Marcello Moores  Register   On: 06/27/2018 08:49      IMPRESSION/PLAN: 1. 68 y.o. gentleman with Stage T2b adenocarcinoma of the prostate with Gleason Score of 4+3, and PSA of 17.10. We discussed the patient's workup and outlined the nature of prostate cancer in this setting. The patient's T stage, Gleason's score, and PSA put him into the unfavorable-intermediate risk group. Accordingly, he Cole eligible for LT-ADT in combination with 8 weeks of external radiation. We discussed the available radiation techniques, and focused on the details and logistics and delivery. We  discussed and  outlined the risks, benefits, short and long-term effects associated with radiotherapy and compared and contrasted these with prostatectomy. We discussed the role of SpaceOAR in reducing the rectal toxicity associated with radiotherapy. We also detailed the role of ADT in the treatment of unfavorable-intermediate risk prostate cancer and outlined the associated side effects that could be expected with this therapy.  At the end of the conversation the patient Cole interested in moving forward with 8 weeks of external beam therapy in combination with LT-ADT. He has not received his first [place] injection but Cole scheduled to receive it tomorrow, March 25th. We will contact [facility] to make arrangements for fiducial marker with SpaceOAR to reduce rectal toxicity from radiotherapy placement prior to simulation, but did discuss that due to the current COVID-19 pandemic, such elective procedures may be delayed, thus delay the start of treatment beyond the usual 2 month period. They voiced understanding and are comfortable with this plan. We will share our discussion with Dr. Jeffie Pollock and coordinate treatment planning with his surgical schedulers in anticipation of beginning IMRT approximately two months after start of ADT.   Given current concerns for patient exposure during the COVID-19 pandemic, this encounter was conducted via telephone.  The patient has given verbal consent for this type of encounter. The time spent during this encounter was 60 minutes. The attendants for this meeting include Tyler Pita MD, Ashlyn Bruning PA-C, [place], patient Dean Cole and his wife, Silva Bandy. During the encounter, Tyler Pita MD, Ashlyn Bruning PA-C, and scribe, Rae Lips were located at [facility] Radiation Oncology Department.  Patient Dean Cole and wife, Silva Bandy were located at home.     [name], PA-C    Tyler Pita, MD  [facility] Direct Dial: [phone]  Fax: [phone] [email or web address]  Skype  [email or web address]  This document serves as a record of services personally performed by Tyler Pita, MD and Freeman Caldron, PA-C. It was created on their behalf by Rae Lips, a trained medical scribe. The creation of this record Cole based on the scribe's personal observations and the providers' statements to them. This document has been checked and approved by the attending providers.

## 2018-07-26 ENCOUNTER — Encounter: Payer: Self-pay | Admitting: Urology

## 2018-07-26 ENCOUNTER — Other Ambulatory Visit: Payer: Self-pay | Admitting: Urology

## 2018-07-26 ENCOUNTER — Telehealth: Payer: Self-pay | Admitting: *Deleted

## 2018-07-26 DIAGNOSIS — C61 Malignant neoplasm of prostate: Secondary | ICD-10-CM

## 2018-07-26 NOTE — Progress Notes (Signed)
Patient will have fiducial markers and spaceOAR gel placed on September 30, 2018 at [facility] urology.  CT simulation is scheduled for June 5 at [place], MMS, PA-C [place] at [place]: [phone]  Fax: [phone]

## 2018-07-26 NOTE — Telephone Encounter (Signed)
Called patient to inform of fid. markers and space oar ON 09-30-18 @ [facility] and his sim will be on 10-04-18 @ 10 am @ Dr. Johny Shears Office, spoke with patient's wife and she is aware of these appts.

## 2018-07-29 ENCOUNTER — Telehealth: Payer: Self-pay | Admitting: Radiation Oncology

## 2018-07-29 NOTE — Telephone Encounter (Signed)
Received voicemail message from wife, Dean Cole, requesting a return call. Phoned wife back promptly. She questions the purpose of the MRI in June. Explained the MRI is to confirm placement of the SpaceOar gel. She verbalized understanding and expressed appreciation for the return call.

## 2018-10-01 ENCOUNTER — Telehealth: Payer: Self-pay | Admitting: Radiation Oncology

## 2018-10-01 NOTE — Telephone Encounter (Signed)
Received voicemail from patient's wife, Silva Bandy, requesting return call. Phoned back promptly. The patient reports Silva Bandy is out at an appointment with their son but he wanted to ask a few questions. Answered several questions about what to expect with CT simulation and MRI tomorrow plus several treatment questions. Answered all questions to the best of my ability and patient verbalized understanding.

## 2018-10-02 ENCOUNTER — Encounter: Payer: Self-pay | Admitting: *Deleted

## 2018-10-04 ENCOUNTER — Other Ambulatory Visit: Payer: Self-pay

## 2018-10-04 ENCOUNTER — Telehealth: Payer: Self-pay | Admitting: *Deleted

## 2018-10-04 ENCOUNTER — Ambulatory Visit
Admission: RE | Admit: 2018-10-04 | Discharge: 2018-10-04 | Disposition: A | Discharge status: Home / Self Care | Payer: Medicare HMO | Source: Ambulatory Visit | Attending: Radiation Oncology | Admitting: Radiation Oncology

## 2018-10-04 ENCOUNTER — Ambulatory Visit (HOSPITAL_COMMUNITY): Payer: Medicare HMO

## 2018-10-04 DIAGNOSIS — Z91012 Allergy to eggs: Secondary | ICD-10-CM | POA: Diagnosis not present

## 2018-10-04 DIAGNOSIS — I25810 Atherosclerosis of coronary artery bypass graft(s) without angina pectoris: Secondary | ICD-10-CM | POA: Insufficient documentation

## 2018-10-04 DIAGNOSIS — Z7982 Long term (current) use of aspirin: Secondary | ICD-10-CM | POA: Insufficient documentation

## 2018-10-04 DIAGNOSIS — Z8042 Family history of malignant neoplasm of prostate: Secondary | ICD-10-CM | POA: Insufficient documentation

## 2018-10-04 DIAGNOSIS — Z87891 Personal history of nicotine dependence: Secondary | ICD-10-CM | POA: Insufficient documentation

## 2018-10-04 DIAGNOSIS — Z8249 Family history of ischemic heart disease and other diseases of the circulatory system: Secondary | ICD-10-CM | POA: Insufficient documentation

## 2018-10-04 DIAGNOSIS — E1151 Type 2 diabetes mellitus with diabetic peripheral angiopathy without gangrene: Secondary | ICD-10-CM | POA: Insufficient documentation

## 2018-10-04 DIAGNOSIS — C61 Malignant neoplasm of prostate: Secondary | ICD-10-CM | POA: Diagnosis not present

## 2018-10-04 DIAGNOSIS — G2581 Restless legs syndrome: Secondary | ICD-10-CM | POA: Diagnosis not present

## 2018-10-04 DIAGNOSIS — Z806 Family history of leukemia: Secondary | ICD-10-CM | POA: Insufficient documentation

## 2018-10-04 DIAGNOSIS — Z888 Allergy status to other drugs, medicaments and biological substances status: Secondary | ICD-10-CM | POA: Diagnosis not present

## 2018-10-04 DIAGNOSIS — I10 Essential (primary) hypertension: Secondary | ICD-10-CM | POA: Diagnosis not present

## 2018-10-04 DIAGNOSIS — Z7984 Long term (current) use of oral hypoglycemic drugs: Secondary | ICD-10-CM | POA: Diagnosis not present

## 2018-10-04 DIAGNOSIS — Z9049 Acquired absence of other specified parts of digestive tract: Secondary | ICD-10-CM | POA: Insufficient documentation

## 2018-10-04 DIAGNOSIS — Z79890 Hormone replacement therapy (postmenopausal): Secondary | ICD-10-CM | POA: Insufficient documentation

## 2018-10-04 DIAGNOSIS — Z85828 Personal history of other malignant neoplasm of skin: Secondary | ICD-10-CM | POA: Diagnosis not present

## 2018-10-04 DIAGNOSIS — Z951 Presence of aortocoronary bypass graft: Secondary | ICD-10-CM | POA: Diagnosis not present

## 2018-10-04 DIAGNOSIS — Z79899 Other long term (current) drug therapy: Secondary | ICD-10-CM | POA: Diagnosis not present

## 2018-10-04 DIAGNOSIS — F329 Major depressive disorder, single episode, unspecified: Secondary | ICD-10-CM | POA: Insufficient documentation

## 2018-10-04 DIAGNOSIS — F419 Anxiety disorder, unspecified: Secondary | ICD-10-CM | POA: Insufficient documentation

## 2018-10-04 NOTE — Progress Notes (Signed)
  Radiation Oncology         [phone] ________________________________  Name: Dean Cole MRN: [id number]  Date: 10/04/2018  DOB: [birth year]  SIMULATION AND TREATMENT PLANNING NOTE    ICD-10-CM   1. Malignant neoplasm of prostate ([place]) C61     DIAGNOSIS:  68 y.o. 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  Identity was confirmed.  All relevant records and images related to the planned course of therapy were reviewed.  The patient freely provided informed written consent to proceed with treatment after reviewing the details related to the planned course of therapy. The consent form was witnessed and verified by the simulation staff.  Then, the patient was set-up in a stable reproducible supine position for radiation therapy.  A vacuum lock pillow device was custom fabricated to position his legs in a reproducible immobilized position.  Then, I performed a urethrogram under sterile conditions to identify the prostatic apex.  CT images were obtained.  Surface markings were placed.  The CT images were loaded into the planning software.  Then the prostate target and avoidance structures including the rectum, bladder, bowel and hips were contoured.  Treatment planning then occurred.  The radiation prescription was entered and confirmed.  A total of one complex treatment devices were fabricated. I have requested : Intensity Modulated Radiotherapy (IMRT) is medically necessary for this case for the following reason:  Rectal sparing.Marland Kitchen  PLAN:  The patient will receive 45 Gy in 25 fractions of 1.8 Gy, followed by a boost to the prostate to a total dose of 75 Gy with 15 additional fractions of 2.0 Gy.  ________________________________  Sheral Apley Tammi Klippel, M.D.

## 2018-10-04 NOTE — Telephone Encounter (Signed)
Called patient to inform of MRI, no answer, unable to leave message

## 2018-10-07 ENCOUNTER — Ambulatory Visit (HOSPITAL_COMMUNITY)
Admission: RE | Admit: 2018-10-07 | Discharge: 2018-10-07 | Disposition: A | Discharge status: Home / Self Care | Payer: Medicare HMO | Source: Ambulatory Visit | Attending: Urology | Admitting: Urology

## 2018-10-07 ENCOUNTER — Other Ambulatory Visit: Payer: Self-pay

## 2018-10-07 DIAGNOSIS — C61 Malignant neoplasm of prostate: Secondary | ICD-10-CM | POA: Insufficient documentation

## 2018-10-14 DIAGNOSIS — C61 Malignant neoplasm of prostate: Secondary | ICD-10-CM | POA: Diagnosis not present

## 2018-10-15 ENCOUNTER — Ambulatory Visit: Payer: Medicare HMO

## 2018-10-15 ENCOUNTER — Telehealth: Payer: Self-pay | Admitting: Radiation Oncology

## 2018-10-15 NOTE — Telephone Encounter (Signed)
Received voicemail message from patient's wife, Dean Cole, requesting a return call. Phoned her back promptly. She called to confirm new start radiation treatment appointment tomorrow at 0930.

## 2018-10-16 ENCOUNTER — Other Ambulatory Visit: Payer: Self-pay

## 2018-10-16 ENCOUNTER — Ambulatory Visit
Admission: RE | Admit: 2018-10-16 | Discharge: 2018-10-16 | Disposition: A | Discharge status: Home / Self Care | Payer: Medicare HMO | Source: Ambulatory Visit | Attending: Radiation Oncology | Admitting: Radiation Oncology

## 2018-10-16 DIAGNOSIS — C61 Malignant neoplasm of prostate: Secondary | ICD-10-CM | POA: Diagnosis not present

## 2018-10-17 ENCOUNTER — Other Ambulatory Visit: Payer: Self-pay

## 2018-10-17 ENCOUNTER — Ambulatory Visit
Admission: RE | Admit: 2018-10-17 | Discharge: 2018-10-17 | Disposition: A | Discharge status: Home / Self Care | Payer: Medicare HMO | Source: Ambulatory Visit | Attending: Radiation Oncology | Admitting: Radiation Oncology

## 2018-10-17 DIAGNOSIS — C61 Malignant neoplasm of prostate: Secondary | ICD-10-CM | POA: Diagnosis not present

## 2018-10-18 ENCOUNTER — Ambulatory Visit
Admission: RE | Admit: 2018-10-18 | Discharge: 2018-10-18 | Disposition: A | Discharge status: Home / Self Care | Payer: Medicare HMO | Source: Ambulatory Visit | Attending: Radiation Oncology | Admitting: Radiation Oncology

## 2018-10-18 ENCOUNTER — Other Ambulatory Visit: Payer: Self-pay

## 2018-10-18 ENCOUNTER — Telehealth: Payer: Self-pay | Admitting: Radiation Oncology

## 2018-10-18 DIAGNOSIS — C61 Malignant neoplasm of prostate: Secondary | ICD-10-CM | POA: Diagnosis not present

## 2018-10-18 NOTE — Telephone Encounter (Signed)
Received voicemail message from patient's wife, Silva Bandy. Returned her call promptly. She reports her grandson (which lives with them) requires surgery on Monday and she will not be able to bring [facility] for his scheduled radiation treatment. Advised against missing several treatment appointments because of the negative impact it could have on the efficacy of the treatment. She verbalized understanding. Explained the missed appointment would be added to the end of his treatment thus he would complete tx on 8/13 instead of 8/12 as originally planned. She verbalized understanding. Informed [place], RT on L1 of these findings.

## 2018-10-21 ENCOUNTER — Ambulatory Visit: Payer: Medicare HMO

## 2018-10-22 ENCOUNTER — Ambulatory Visit
Admission: RE | Admit: 2018-10-22 | Discharge: 2018-10-22 | Disposition: A | Discharge status: Home / Self Care | Payer: Medicare HMO | Source: Ambulatory Visit | Attending: Radiation Oncology | Admitting: Radiation Oncology

## 2018-10-22 ENCOUNTER — Other Ambulatory Visit: Payer: Self-pay

## 2018-10-22 DIAGNOSIS — C61 Malignant neoplasm of prostate: Secondary | ICD-10-CM | POA: Diagnosis not present

## 2018-10-23 ENCOUNTER — Other Ambulatory Visit: Payer: Self-pay

## 2018-10-23 ENCOUNTER — Ambulatory Visit
Admission: RE | Admit: 2018-10-23 | Discharge: 2018-10-23 | Disposition: A | Discharge status: Home / Self Care | Payer: Medicare HMO | Source: Ambulatory Visit | Attending: Radiation Oncology | Admitting: Radiation Oncology

## 2018-10-23 DIAGNOSIS — C61 Malignant neoplasm of prostate: Secondary | ICD-10-CM | POA: Diagnosis not present

## 2018-10-24 ENCOUNTER — Other Ambulatory Visit: Payer: Self-pay

## 2018-10-24 ENCOUNTER — Ambulatory Visit
Admission: RE | Admit: 2018-10-24 | Discharge: 2018-10-24 | Disposition: A | Discharge status: Home / Self Care | Payer: Medicare HMO | Source: Ambulatory Visit | Attending: Radiation Oncology | Admitting: Radiation Oncology

## 2018-10-24 DIAGNOSIS — C61 Malignant neoplasm of prostate: Secondary | ICD-10-CM | POA: Diagnosis not present

## 2018-10-25 ENCOUNTER — Other Ambulatory Visit: Payer: Self-pay

## 2018-10-25 ENCOUNTER — Ambulatory Visit
Admission: RE | Admit: 2018-10-25 | Discharge: 2018-10-25 | Disposition: A | Discharge status: Home / Self Care | Payer: Medicare HMO | Source: Ambulatory Visit | Attending: Radiation Oncology | Admitting: Radiation Oncology

## 2018-10-25 DIAGNOSIS — C61 Malignant neoplasm of prostate: Secondary | ICD-10-CM | POA: Diagnosis not present

## 2018-10-28 ENCOUNTER — Other Ambulatory Visit: Payer: Self-pay

## 2018-10-28 ENCOUNTER — Ambulatory Visit
Admission: RE | Admit: 2018-10-28 | Discharge: 2018-10-28 | Disposition: A | Discharge status: Home / Self Care | Payer: Medicare HMO | Source: Ambulatory Visit | Attending: Radiation Oncology | Admitting: Radiation Oncology

## 2018-10-28 DIAGNOSIS — C61 Malignant neoplasm of prostate: Secondary | ICD-10-CM | POA: Diagnosis not present

## 2018-10-29 ENCOUNTER — Ambulatory Visit
Admission: RE | Admit: 2018-10-29 | Discharge: 2018-10-29 | Disposition: A | Discharge status: Home / Self Care | Payer: Medicare HMO | Source: Ambulatory Visit | Attending: Radiation Oncology | Admitting: Radiation Oncology

## 2018-10-29 ENCOUNTER — Other Ambulatory Visit: Payer: Self-pay

## 2018-10-29 DIAGNOSIS — C61 Malignant neoplasm of prostate: Secondary | ICD-10-CM | POA: Diagnosis not present

## 2018-10-30 ENCOUNTER — Other Ambulatory Visit: Payer: Self-pay

## 2018-10-30 ENCOUNTER — Ambulatory Visit
Admission: RE | Admit: 2018-10-30 | Discharge: 2018-10-30 | Disposition: A | Discharge status: Home / Self Care | Payer: Medicare HMO | Source: Ambulatory Visit | Attending: Radiation Oncology | Admitting: Radiation Oncology

## 2018-10-30 DIAGNOSIS — Z79890 Hormone replacement therapy (postmenopausal): Secondary | ICD-10-CM | POA: Diagnosis not present

## 2018-10-30 DIAGNOSIS — E1151 Type 2 diabetes mellitus with diabetic peripheral angiopathy without gangrene: Secondary | ICD-10-CM | POA: Diagnosis not present

## 2018-10-30 DIAGNOSIS — C61 Malignant neoplasm of prostate: Secondary | ICD-10-CM | POA: Diagnosis not present

## 2018-10-30 DIAGNOSIS — I10 Essential (primary) hypertension: Secondary | ICD-10-CM | POA: Insufficient documentation

## 2018-10-30 DIAGNOSIS — Z8249 Family history of ischemic heart disease and other diseases of the circulatory system: Secondary | ICD-10-CM | POA: Insufficient documentation

## 2018-10-30 DIAGNOSIS — Z87891 Personal history of nicotine dependence: Secondary | ICD-10-CM | POA: Insufficient documentation

## 2018-10-30 DIAGNOSIS — I25810 Atherosclerosis of coronary artery bypass graft(s) without angina pectoris: Secondary | ICD-10-CM | POA: Insufficient documentation

## 2018-10-30 DIAGNOSIS — Z79899 Other long term (current) drug therapy: Secondary | ICD-10-CM | POA: Diagnosis not present

## 2018-10-30 DIAGNOSIS — Z951 Presence of aortocoronary bypass graft: Secondary | ICD-10-CM | POA: Insufficient documentation

## 2018-10-30 DIAGNOSIS — F329 Major depressive disorder, single episode, unspecified: Secondary | ICD-10-CM | POA: Diagnosis not present

## 2018-10-30 DIAGNOSIS — F419 Anxiety disorder, unspecified: Secondary | ICD-10-CM | POA: Diagnosis not present

## 2018-10-30 DIAGNOSIS — Z7982 Long term (current) use of aspirin: Secondary | ICD-10-CM | POA: Diagnosis not present

## 2018-10-30 DIAGNOSIS — Z7984 Long term (current) use of oral hypoglycemic drugs: Secondary | ICD-10-CM | POA: Insufficient documentation

## 2018-10-30 DIAGNOSIS — Z9049 Acquired absence of other specified parts of digestive tract: Secondary | ICD-10-CM | POA: Insufficient documentation

## 2018-10-30 DIAGNOSIS — Z85828 Personal history of other malignant neoplasm of skin: Secondary | ICD-10-CM | POA: Diagnosis not present

## 2018-10-30 DIAGNOSIS — Z8042 Family history of malignant neoplasm of prostate: Secondary | ICD-10-CM | POA: Diagnosis not present

## 2018-10-30 DIAGNOSIS — Z806 Family history of leukemia: Secondary | ICD-10-CM | POA: Diagnosis not present

## 2018-10-30 DIAGNOSIS — Z888 Allergy status to other drugs, medicaments and biological substances status: Secondary | ICD-10-CM | POA: Diagnosis not present

## 2018-10-30 DIAGNOSIS — Z91012 Allergy to eggs: Secondary | ICD-10-CM | POA: Insufficient documentation

## 2018-10-30 DIAGNOSIS — G2581 Restless legs syndrome: Secondary | ICD-10-CM | POA: Insufficient documentation

## 2018-10-31 ENCOUNTER — Ambulatory Visit
Admission: RE | Admit: 2018-10-31 | Discharge: 2018-10-31 | Disposition: A | Discharge status: Home / Self Care | Payer: Medicare HMO | Source: Ambulatory Visit | Attending: Radiation Oncology | Admitting: Radiation Oncology

## 2018-10-31 ENCOUNTER — Other Ambulatory Visit: Payer: Self-pay

## 2018-10-31 DIAGNOSIS — C61 Malignant neoplasm of prostate: Secondary | ICD-10-CM | POA: Diagnosis not present

## 2018-11-04 ENCOUNTER — Other Ambulatory Visit: Payer: Self-pay

## 2018-11-04 ENCOUNTER — Ambulatory Visit
Admission: RE | Admit: 2018-11-04 | Discharge: 2018-11-04 | Disposition: A | Discharge status: Home / Self Care | Payer: Medicare HMO | Source: Ambulatory Visit | Attending: Radiation Oncology | Admitting: Radiation Oncology

## 2018-11-04 DIAGNOSIS — C61 Malignant neoplasm of prostate: Secondary | ICD-10-CM | POA: Diagnosis not present

## 2018-11-05 ENCOUNTER — Other Ambulatory Visit: Payer: Self-pay

## 2018-11-05 ENCOUNTER — Ambulatory Visit
Admission: RE | Admit: 2018-11-05 | Discharge: 2018-11-05 | Disposition: A | Discharge status: Home / Self Care | Payer: Medicare HMO | Source: Ambulatory Visit | Attending: Radiation Oncology | Admitting: Radiation Oncology

## 2018-11-05 DIAGNOSIS — C61 Malignant neoplasm of prostate: Secondary | ICD-10-CM | POA: Diagnosis not present

## 2018-11-06 ENCOUNTER — Ambulatory Visit
Admission: RE | Admit: 2018-11-06 | Discharge: 2018-11-06 | Disposition: A | Discharge status: Home / Self Care | Payer: Medicare HMO | Source: Ambulatory Visit | Attending: Radiation Oncology | Admitting: Radiation Oncology

## 2018-11-06 ENCOUNTER — Other Ambulatory Visit: Payer: Self-pay

## 2018-11-06 DIAGNOSIS — C61 Malignant neoplasm of prostate: Secondary | ICD-10-CM | POA: Diagnosis not present

## 2018-11-07 ENCOUNTER — Other Ambulatory Visit: Payer: Self-pay

## 2018-11-07 ENCOUNTER — Ambulatory Visit
Admission: RE | Admit: 2018-11-07 | Discharge: 2018-11-07 | Disposition: A | Discharge status: Home / Self Care | Payer: Medicare HMO | Source: Ambulatory Visit | Attending: Radiation Oncology | Admitting: Radiation Oncology

## 2018-11-07 DIAGNOSIS — C61 Malignant neoplasm of prostate: Secondary | ICD-10-CM | POA: Diagnosis not present

## 2018-11-08 ENCOUNTER — Ambulatory Visit
Admission: RE | Admit: 2018-11-08 | Discharge: 2018-11-08 | Disposition: A | Discharge status: Home / Self Care | Payer: Medicare HMO | Source: Ambulatory Visit | Attending: Radiation Oncology | Admitting: Radiation Oncology

## 2018-11-08 ENCOUNTER — Other Ambulatory Visit: Payer: Self-pay

## 2018-11-08 DIAGNOSIS — C61 Malignant neoplasm of prostate: Secondary | ICD-10-CM | POA: Diagnosis not present

## 2018-11-11 ENCOUNTER — Ambulatory Visit
Admission: RE | Admit: 2018-11-11 | Discharge: 2018-11-11 | Disposition: A | Discharge status: Home / Self Care | Payer: Medicare HMO | Source: Ambulatory Visit | Attending: Radiation Oncology | Admitting: Radiation Oncology

## 2018-11-11 ENCOUNTER — Other Ambulatory Visit: Payer: Self-pay

## 2018-11-11 DIAGNOSIS — C61 Malignant neoplasm of prostate: Secondary | ICD-10-CM | POA: Diagnosis not present

## 2018-11-12 ENCOUNTER — Other Ambulatory Visit: Payer: Self-pay

## 2018-11-12 ENCOUNTER — Ambulatory Visit
Admission: RE | Admit: 2018-11-12 | Discharge: 2018-11-12 | Disposition: A | Discharge status: Home / Self Care | Payer: Medicare HMO | Source: Ambulatory Visit | Attending: Radiation Oncology | Admitting: Radiation Oncology

## 2018-11-12 DIAGNOSIS — C61 Malignant neoplasm of prostate: Secondary | ICD-10-CM | POA: Diagnosis not present

## 2018-11-13 ENCOUNTER — Ambulatory Visit
Admission: RE | Admit: 2018-11-13 | Discharge: 2018-11-13 | Disposition: A | Discharge status: Home / Self Care | Payer: Medicare HMO | Source: Ambulatory Visit | Attending: Radiation Oncology | Admitting: Radiation Oncology

## 2018-11-13 ENCOUNTER — Other Ambulatory Visit: Payer: Self-pay

## 2018-11-13 DIAGNOSIS — C61 Malignant neoplasm of prostate: Secondary | ICD-10-CM | POA: Diagnosis not present

## 2018-11-14 ENCOUNTER — Ambulatory Visit
Admission: RE | Admit: 2018-11-14 | Discharge: 2018-11-14 | Disposition: A | Discharge status: Home / Self Care | Payer: Medicare HMO | Source: Ambulatory Visit | Attending: Radiation Oncology | Admitting: Radiation Oncology

## 2018-11-14 ENCOUNTER — Other Ambulatory Visit: Payer: Self-pay

## 2018-11-14 DIAGNOSIS — C61 Malignant neoplasm of prostate: Secondary | ICD-10-CM | POA: Diagnosis not present

## 2018-11-15 ENCOUNTER — Ambulatory Visit
Admission: RE | Admit: 2018-11-15 | Discharge: 2018-11-15 | Disposition: A | Discharge status: Home / Self Care | Payer: Medicare HMO | Source: Ambulatory Visit | Attending: Radiation Oncology | Admitting: Radiation Oncology

## 2018-11-15 ENCOUNTER — Other Ambulatory Visit: Payer: Self-pay

## 2018-11-15 DIAGNOSIS — C61 Malignant neoplasm of prostate: Secondary | ICD-10-CM | POA: Diagnosis not present

## 2018-11-18 ENCOUNTER — Ambulatory Visit
Admission: RE | Admit: 2018-11-18 | Discharge: 2018-11-18 | Disposition: A | Discharge status: Home / Self Care | Payer: Medicare HMO | Source: Ambulatory Visit | Attending: Radiation Oncology | Admitting: Radiation Oncology

## 2018-11-18 ENCOUNTER — Other Ambulatory Visit: Payer: Self-pay

## 2018-11-18 DIAGNOSIS — C61 Malignant neoplasm of prostate: Secondary | ICD-10-CM | POA: Diagnosis not present

## 2018-11-19 ENCOUNTER — Other Ambulatory Visit: Payer: Self-pay

## 2018-11-19 ENCOUNTER — Ambulatory Visit
Admission: RE | Admit: 2018-11-19 | Discharge: 2018-11-19 | Disposition: A | Discharge status: Home / Self Care | Payer: Medicare HMO | Source: Ambulatory Visit | Attending: Radiation Oncology | Admitting: Radiation Oncology

## 2018-11-19 DIAGNOSIS — C61 Malignant neoplasm of prostate: Secondary | ICD-10-CM | POA: Diagnosis not present

## 2018-11-20 ENCOUNTER — Encounter: Payer: Self-pay | Admitting: Radiation Oncology

## 2018-11-20 ENCOUNTER — Ambulatory Visit
Admission: RE | Admit: 2018-11-20 | Discharge: 2018-11-20 | Disposition: A | Discharge status: Home / Self Care | Payer: Medicare HMO | Source: Ambulatory Visit | Attending: Radiation Oncology | Admitting: Radiation Oncology

## 2018-11-20 ENCOUNTER — Ambulatory Visit: Payer: Medicare HMO

## 2018-11-20 ENCOUNTER — Telehealth: Payer: Self-pay | Admitting: Radiation Oncology

## 2018-11-20 DIAGNOSIS — C61 Malignant neoplasm of prostate: Secondary | ICD-10-CM | POA: Diagnosis not present

## 2018-11-20 NOTE — Telephone Encounter (Signed)
Received voicemail message from patient's wife, Silva Bandy, that letter is needed for court. Phoned her back for clarification and details. Silva Bandy states, "that information is at the house and I will call you back once we get home." Awaiting call back with court date, address, and email address to send confirmation letter that patient is actively receiving radiation therapy.

## 2018-11-20 NOTE — Progress Notes (Signed)
Copy of letter mailed to patient's home. Copy of letter placed on treatment machine for patient to pick up tomorrow following radiation treatment. Also, letter emailed to [email or web address] as requested by patient's wife, Dean Cole.

## 2018-11-21 ENCOUNTER — Ambulatory Visit
Admission: RE | Admit: 2018-11-21 | Discharge: 2018-11-21 | Disposition: A | Discharge status: Home / Self Care | Payer: Medicare HMO | Source: Ambulatory Visit | Attending: Radiation Oncology | Admitting: Radiation Oncology

## 2018-11-21 ENCOUNTER — Ambulatory Visit: Payer: Medicare HMO

## 2018-11-21 DIAGNOSIS — C61 Malignant neoplasm of prostate: Secondary | ICD-10-CM | POA: Diagnosis not present

## 2018-11-22 ENCOUNTER — Ambulatory Visit
Admission: RE | Admit: 2018-11-22 | Discharge: 2018-11-22 | Disposition: A | Discharge status: Home / Self Care | Payer: Medicare HMO | Source: Ambulatory Visit | Attending: Radiation Oncology | Admitting: Radiation Oncology

## 2018-11-22 ENCOUNTER — Other Ambulatory Visit: Payer: Self-pay | Admitting: Radiation Oncology

## 2018-11-22 ENCOUNTER — Other Ambulatory Visit: Payer: Self-pay

## 2018-11-22 ENCOUNTER — Ambulatory Visit: Payer: Medicare HMO

## 2018-11-22 DIAGNOSIS — C61 Malignant neoplasm of prostate: Secondary | ICD-10-CM | POA: Diagnosis not present

## 2018-11-25 ENCOUNTER — Telehealth: Payer: Self-pay | Admitting: Radiation Oncology

## 2018-11-25 ENCOUNTER — Ambulatory Visit
Admission: RE | Admit: 2018-11-25 | Discharge: 2018-11-25 | Disposition: A | Discharge status: Home / Self Care | Payer: Medicare HMO | Source: Ambulatory Visit | Attending: Radiation Oncology | Admitting: Radiation Oncology

## 2018-11-25 ENCOUNTER — Other Ambulatory Visit: Payer: Self-pay

## 2018-11-25 DIAGNOSIS — C61 Malignant neoplasm of prostate: Secondary | ICD-10-CM | POA: Diagnosis not present

## 2018-11-25 NOTE — Telephone Encounter (Signed)
Received voicemail message from patient's wife, Silva Bandy, inquiring if lightheadedness and racing heartbeat is a side effect of radiation therapy. Phoned her back promptly explaining that the symptoms she described are not related to prostate radiation but could be indicative to a cardiac issue. Strongly encouraged her to contact his cardiologist asap and she verbalized understanding.

## 2018-11-26 ENCOUNTER — Ambulatory Visit
Admission: RE | Admit: 2018-11-26 | Discharge: 2018-11-26 | Disposition: A | Discharge status: Home / Self Care | Payer: Medicare HMO | Source: Ambulatory Visit | Attending: Radiation Oncology | Admitting: Radiation Oncology

## 2018-11-26 ENCOUNTER — Other Ambulatory Visit: Payer: Self-pay

## 2018-11-26 ENCOUNTER — Telehealth: Payer: Self-pay | Admitting: Radiation Oncology

## 2018-11-26 DIAGNOSIS — C61 Malignant neoplasm of prostate: Secondary | ICD-10-CM | POA: Diagnosis not present

## 2018-11-26 NOTE — Telephone Encounter (Signed)
Received voicemail message from Dorthula Perfect, attorney, requesting call back reference this patient. Phoned patient's wife's cell. No answer. Left voicemail message. Phoned patient's home. Spoke with wife, Silva Bandy. She confirmed that Dorthula Perfect is her husband's attorney and I am ok to speak with her reference his treatment. Phoned Dorthula Perfect back at [phone]. No answer. Left voicemail message with my direct number. Awaiting return call.

## 2018-11-27 ENCOUNTER — Ambulatory Visit
Admission: RE | Admit: 2018-11-27 | Discharge: 2018-11-27 | Disposition: A | Discharge status: Home / Self Care | Payer: Medicare HMO | Source: Ambulatory Visit | Attending: Radiation Oncology | Admitting: Radiation Oncology

## 2018-11-27 ENCOUNTER — Other Ambulatory Visit: Payer: Self-pay

## 2018-11-27 DIAGNOSIS — C61 Malignant neoplasm of prostate: Secondary | ICD-10-CM | POA: Diagnosis not present

## 2018-11-28 ENCOUNTER — Telehealth: Payer: Self-pay | Admitting: Radiation Oncology

## 2018-11-28 ENCOUNTER — Other Ambulatory Visit: Payer: Self-pay

## 2018-11-28 ENCOUNTER — Ambulatory Visit
Admission: RE | Admit: 2018-11-28 | Discharge: 2018-11-28 | Disposition: A | Discharge status: Home / Self Care | Payer: Medicare HMO | Source: Ambulatory Visit | Attending: Radiation Oncology | Admitting: Radiation Oncology

## 2018-11-28 DIAGNOSIS — C61 Malignant neoplasm of prostate: Secondary | ICD-10-CM | POA: Diagnosis not present

## 2018-11-28 NOTE — Telephone Encounter (Signed)
Received voicemail from Girard Cooter, [place] requesting a return call reference this patient. Phoned back promptly to inquire. She reference letter provided to the court by patient's attorney. Confirmed the patient receives treatment Monday - Friday at [facility] until August 13th. Explained the patient has not missed any treatment thus far this week. Explained his treatment appointment is for 15 minutes but he could spend up to 45 minutes in our facility related to delays in registration and having to redress after treatment. Explained that fatigue, urinary frequency, urgency and diarrhea are all associated with prostate radiation treatments. She verbalized understanding and expressed appreciation for the callback.

## 2018-11-29 ENCOUNTER — Ambulatory Visit
Admission: RE | Admit: 2018-11-29 | Discharge: 2018-11-29 | Disposition: A | Discharge status: Home / Self Care | Payer: Medicare HMO | Source: Ambulatory Visit | Attending: Radiation Oncology | Admitting: Radiation Oncology

## 2018-11-29 ENCOUNTER — Other Ambulatory Visit: Payer: Self-pay

## 2018-11-29 ENCOUNTER — Other Ambulatory Visit: Payer: Self-pay | Admitting: Radiation Oncology

## 2018-11-29 DIAGNOSIS — C61 Malignant neoplasm of prostate: Secondary | ICD-10-CM | POA: Diagnosis not present

## 2018-11-29 MED ORDER — PHENAZOPYRIDINE HCL 200 MG PO TABS: 200.0000 mg | ORAL_TABLET | Freq: Three times a day (TID) | ORAL | 3 refills | Status: DC | PRN

## 2018-12-02 ENCOUNTER — Other Ambulatory Visit: Payer: Self-pay

## 2018-12-02 ENCOUNTER — Ambulatory Visit
Admission: RE | Admit: 2018-12-02 | Discharge: 2018-12-02 | Disposition: A | Discharge status: Home / Self Care | Payer: Medicare HMO | Source: Ambulatory Visit | Attending: Radiation Oncology | Admitting: Radiation Oncology

## 2018-12-02 DIAGNOSIS — G2581 Restless legs syndrome: Secondary | ICD-10-CM | POA: Diagnosis not present

## 2018-12-02 DIAGNOSIS — Z87891 Personal history of nicotine dependence: Secondary | ICD-10-CM | POA: Diagnosis not present

## 2018-12-02 DIAGNOSIS — Z806 Family history of leukemia: Secondary | ICD-10-CM | POA: Insufficient documentation

## 2018-12-02 DIAGNOSIS — Z85828 Personal history of other malignant neoplasm of skin: Secondary | ICD-10-CM | POA: Insufficient documentation

## 2018-12-02 DIAGNOSIS — Z510 Encounter for antineoplastic radiation therapy: Secondary | ICD-10-CM | POA: Insufficient documentation

## 2018-12-02 DIAGNOSIS — Z7982 Long term (current) use of aspirin: Secondary | ICD-10-CM | POA: Insufficient documentation

## 2018-12-02 DIAGNOSIS — Z9049 Acquired absence of other specified parts of digestive tract: Secondary | ICD-10-CM | POA: Diagnosis not present

## 2018-12-02 DIAGNOSIS — C61 Malignant neoplasm of prostate: Secondary | ICD-10-CM | POA: Insufficient documentation

## 2018-12-02 DIAGNOSIS — E1151 Type 2 diabetes mellitus with diabetic peripheral angiopathy without gangrene: Secondary | ICD-10-CM | POA: Diagnosis not present

## 2018-12-02 DIAGNOSIS — I25810 Atherosclerosis of coronary artery bypass graft(s) without angina pectoris: Secondary | ICD-10-CM | POA: Insufficient documentation

## 2018-12-02 DIAGNOSIS — Z951 Presence of aortocoronary bypass graft: Secondary | ICD-10-CM | POA: Insufficient documentation

## 2018-12-02 DIAGNOSIS — F419 Anxiety disorder, unspecified: Secondary | ICD-10-CM | POA: Diagnosis not present

## 2018-12-02 DIAGNOSIS — Z79890 Hormone replacement therapy (postmenopausal): Secondary | ICD-10-CM | POA: Insufficient documentation

## 2018-12-02 DIAGNOSIS — Z91012 Allergy to eggs: Secondary | ICD-10-CM | POA: Diagnosis not present

## 2018-12-02 DIAGNOSIS — Z888 Allergy status to other drugs, medicaments and biological substances status: Secondary | ICD-10-CM | POA: Diagnosis not present

## 2018-12-02 DIAGNOSIS — I10 Essential (primary) hypertension: Secondary | ICD-10-CM | POA: Insufficient documentation

## 2018-12-02 DIAGNOSIS — Z8042 Family history of malignant neoplasm of prostate: Secondary | ICD-10-CM | POA: Insufficient documentation

## 2018-12-02 DIAGNOSIS — Z7984 Long term (current) use of oral hypoglycemic drugs: Secondary | ICD-10-CM | POA: Insufficient documentation

## 2018-12-02 DIAGNOSIS — Z79899 Other long term (current) drug therapy: Secondary | ICD-10-CM | POA: Insufficient documentation

## 2018-12-02 DIAGNOSIS — F329 Major depressive disorder, single episode, unspecified: Secondary | ICD-10-CM | POA: Insufficient documentation

## 2018-12-02 DIAGNOSIS — Z8249 Family history of ischemic heart disease and other diseases of the circulatory system: Secondary | ICD-10-CM | POA: Diagnosis not present

## 2018-12-03 ENCOUNTER — Ambulatory Visit
Admission: RE | Admit: 2018-12-03 | Discharge: 2018-12-03 | Disposition: A | Discharge status: Home / Self Care | Payer: Medicare HMO | Source: Ambulatory Visit | Attending: Radiation Oncology | Admitting: Radiation Oncology

## 2018-12-03 ENCOUNTER — Other Ambulatory Visit: Payer: Self-pay

## 2018-12-03 DIAGNOSIS — Z510 Encounter for antineoplastic radiation therapy: Secondary | ICD-10-CM | POA: Diagnosis not present

## 2018-12-04 ENCOUNTER — Ambulatory Visit
Admission: RE | Admit: 2018-12-04 | Discharge: 2018-12-04 | Disposition: A | Discharge status: Home / Self Care | Payer: Medicare HMO | Source: Ambulatory Visit | Attending: Radiation Oncology | Admitting: Radiation Oncology

## 2018-12-04 ENCOUNTER — Other Ambulatory Visit: Payer: Self-pay

## 2018-12-04 DIAGNOSIS — Z510 Encounter for antineoplastic radiation therapy: Secondary | ICD-10-CM | POA: Diagnosis not present

## 2018-12-05 ENCOUNTER — Other Ambulatory Visit: Payer: Self-pay

## 2018-12-05 ENCOUNTER — Ambulatory Visit
Admission: RE | Admit: 2018-12-05 | Discharge: 2018-12-05 | Disposition: A | Discharge status: Home / Self Care | Payer: Medicare HMO | Source: Ambulatory Visit | Attending: Radiation Oncology | Admitting: Radiation Oncology

## 2018-12-05 DIAGNOSIS — Z510 Encounter for antineoplastic radiation therapy: Secondary | ICD-10-CM | POA: Diagnosis not present

## 2018-12-06 ENCOUNTER — Other Ambulatory Visit: Payer: Self-pay

## 2018-12-06 ENCOUNTER — Ambulatory Visit
Admission: RE | Admit: 2018-12-06 | Discharge: 2018-12-06 | Disposition: A | Discharge status: Home / Self Care | Payer: Medicare HMO | Source: Ambulatory Visit | Attending: Radiation Oncology | Admitting: Radiation Oncology

## 2018-12-06 DIAGNOSIS — Z510 Encounter for antineoplastic radiation therapy: Secondary | ICD-10-CM | POA: Diagnosis not present

## 2018-12-09 ENCOUNTER — Other Ambulatory Visit: Payer: Self-pay

## 2018-12-09 ENCOUNTER — Ambulatory Visit
Admission: RE | Admit: 2018-12-09 | Discharge: 2018-12-09 | Disposition: A | Discharge status: Home / Self Care | Payer: Medicare HMO | Source: Ambulatory Visit | Attending: Radiation Oncology | Admitting: Radiation Oncology

## 2018-12-09 DIAGNOSIS — Z510 Encounter for antineoplastic radiation therapy: Secondary | ICD-10-CM | POA: Diagnosis not present

## 2018-12-10 ENCOUNTER — Ambulatory Visit: Payer: Medicare HMO

## 2018-12-10 ENCOUNTER — Ambulatory Visit
Admission: RE | Admit: 2018-12-10 | Discharge: 2018-12-10 | Disposition: A | Discharge status: Home / Self Care | Payer: Medicare HMO | Source: Ambulatory Visit | Attending: Radiation Oncology | Admitting: Radiation Oncology

## 2018-12-10 ENCOUNTER — Other Ambulatory Visit: Payer: Self-pay

## 2018-12-10 DIAGNOSIS — Z510 Encounter for antineoplastic radiation therapy: Secondary | ICD-10-CM | POA: Diagnosis not present

## 2018-12-11 ENCOUNTER — Other Ambulatory Visit: Payer: Self-pay

## 2018-12-11 ENCOUNTER — Ambulatory Visit: Payer: Medicare HMO

## 2018-12-11 ENCOUNTER — Ambulatory Visit
Admission: RE | Admit: 2018-12-11 | Discharge: 2018-12-11 | Disposition: A | Discharge status: Home / Self Care | Payer: Medicare HMO | Source: Ambulatory Visit | Attending: Radiation Oncology | Admitting: Radiation Oncology

## 2018-12-11 DIAGNOSIS — Z510 Encounter for antineoplastic radiation therapy: Secondary | ICD-10-CM | POA: Diagnosis not present

## 2018-12-12 ENCOUNTER — Encounter: Payer: Self-pay | Admitting: Radiation Oncology

## 2018-12-12 ENCOUNTER — Ambulatory Visit
Admission: RE | Admit: 2018-12-12 | Discharge: 2018-12-12 | Disposition: A | Discharge status: Home / Self Care | Payer: Medicare HMO | Source: Ambulatory Visit | Attending: Radiation Oncology | Admitting: Radiation Oncology

## 2018-12-12 ENCOUNTER — Other Ambulatory Visit: Payer: Self-pay

## 2018-12-12 DIAGNOSIS — Z510 Encounter for antineoplastic radiation therapy: Secondary | ICD-10-CM | POA: Diagnosis not present

## 2018-12-13 ENCOUNTER — Telehealth: Payer: Self-pay

## 2018-12-13 NOTE — Telephone Encounter (Signed)
Virtual Visit Pre-Appointment Phone Call  "(Name), I am calling you today to discuss your upcoming appointment. We are currently trying to limit exposure to the virus that causes COVID-19 by seeing patients at home rather than in the office."  1. "What is the BEST phone number to call the day of the visit?" - include this in appointment notes  2. "Do you have or have access to (through a family member/friend) a smartphone with video capability that we can use for your visit?" a. If yes - list this number in appt notes as "cell" (if different from BEST phone #) and list the appointment type as a VIDEO visit in appointment notes b. If no - list the appointment type as a PHONE visit in appointment notes  3. Confirm consent - "In the setting of the current Covid19 crisis, you are scheduled for a (phone or video) visit with your provider on (date) at (time).  Just as we do with many in-office visits, in order for you to participate in this visit, we must obtain consent.  If you'd like, I can send this to your mychart (if signed up) or email for you to review.  Otherwise, I can obtain your verbal consent now.  All virtual visits are billed to your insurance company just like a normal visit would be.  By agreeing to a virtual visit, we'd like you to understand that the technology does not allow for your provider to perform an examination, and thus may limit your provider's ability to fully assess your condition. If your provider identifies any concerns that need to be evaluated in person, we will make arrangements to do so.  Finally, though the technology is pretty good, we cannot assure that it will always work on either your or our end, and in the setting of a video visit, we may have to convert it to a phone-only visit.  In either situation, we cannot ensure that we have a secure connection.  Are you willing to proceed?" STAFF: Did the patient verbally acknowledge consent to telehealth visit? Document  YES/NO here:yes  4. Advise patient to be prepared - "Two hours prior to your appointment, go ahead and check your blood pressure, pulse, oxygen saturation, and your weight (if you have the equipment to check those) and write them all down. When your visit starts, your provider will ask you for this information. If you have an Apple Watch or Kardia device, please plan to have heart rate information ready on the day of your appointment. Please have a pen and paper handy nearby the day of the visit as well."  5. Give patient instructions for MyChart download to smartphone OR Doximity/Doxy.me as below if video visit (depending on what platform provider is using)  6. Inform patient they will receive a phone call 15 minutes prior to their appointment time (may be from unknown caller ID) so they should be prepared to answer    TELEPHONE CALL NOTE  Dean Cole has been deemed a candidate for a follow-up tele-health visit to limit community exposure during the Covid-19 pandemic. I spoke with the patient via phone to ensure availability of phone/video source, confirm preferred email & phone number, and discuss instructions and expectations.  I reminded Dean Cole to be prepared with any vital sign and/or heart rhythm information that could potentially be obtained via home monitoring, at the time of his visit. I reminded Dean Cole to expect a phone call prior to his  visit.  [name], [facility] 12/13/2018 2:28 PM   INSTRUCTIONS FOR DOWNLOADING THE MYCHART APP TO SMARTPHONE  - The patient must first make sure to have activated MyChart and know their login information - If Apple, go to [employer] and type in MyChart in the search bar and download the app. If Android, ask patient to go to [employer] and type in [place] in the search bar and download the app. The app is free but as with any other app downloads, their phone may require them to verify saved payment information or  Apple/Android password.  - The patient will need to then log into the app with their MyChart username and password, and select [facility] as their healthcare provider to link the account. When it is time for your visit, go to the MyChart app, find appointments, and click Begin Video Visit. Be sure to Select Allow for your device to access the Microphone and Camera for your visit. You will then be connected, and your provider will be with you shortly.  **If they have any issues connecting, or need assistance please contact MyChart service desk [phone])**  **If using a computer, in order to ensure the best quality for their visit they will need to use either of the following Internet Browsers: [employer], or Google Chrome**  IF USING DOXIMITY or DOXY.ME - The patient will receive a link just prior to their visit by text.     FULL LENGTH CONSENT FOR TELE-HEALTH VISIT   I hereby voluntarily request, consent and authorize [facility] and its employed or contracted physicians, physician assistants, nurse practitioners or other licensed health care professionals (the Practitioner), to provide me with telemedicine health care services (the "Services") as deemed necessary by the treating Practitioner. I acknowledge and consent to receive the Services by the Practitioner via telemedicine. I understand that the telemedicine visit will involve communicating with the Practitioner through live audiovisual communication technology and the disclosure of certain medical information by electronic transmission. I acknowledge that I have been given the opportunity to request an in-person assessment or other available alternative prior to the telemedicine visit and am voluntarily participating in the telemedicine visit.  I understand that I have the right to withhold or withdraw my consent to the use of telemedicine in the course of my care at any time, without affecting my right to future care  or treatment, and that the Practitioner or I may terminate the telemedicine visit at any time. I understand that I have the right to inspect all information obtained and/or recorded in the course of the telemedicine visit and may receive copies of available information for a reasonable fee.  I understand that some of the potential risks of receiving the Services via telemedicine include:  Marland Kitchen Delay or interruption in medical evaluation due to technological equipment failure or disruption; . Information transmitted may not be sufficient (e.g. poor resolution of images) to allow for appropriate medical decision making by the Practitioner; and/or  . In rare instances, security protocols could fail, causing a breach of personal health information.  Furthermore, I acknowledge that it is my responsibility to provide information about my medical history, conditions and care that is complete and accurate to the best of my ability. I acknowledge that Practitioner's advice, recommendations, and/or decision may be based on factors not within their control, such as incomplete or inaccurate data provided by me or distortions of diagnostic images or specimens that may result from electronic transmissions. I  understand that the practice of medicine is not an exact science and that Practitioner makes no warranties or guarantees regarding treatment outcomes. I acknowledge that I will receive a copy of this consent concurrently upon execution via email to the email address I last provided but may also request a printed copy by calling the office of [facility].    I understand that my insurance will be billed for this visit.   I have read or had this consent read to me. . I understand the contents of this consent, which adequately explains the benefits and risks of the Services being provided via telemedicine.  . I have been provided ample opportunity to ask questions regarding this consent and the Services and have had  my questions answered to my satisfaction. . I give my informed consent for the services to be provided through the use of telemedicine in my medical care  By participating in this telemedicine visit I agree to the above.

## 2018-12-15 NOTE — Progress Notes (Signed)
Virtual Visit via Telephone Note   This visit type was conducted due to national recommendations for restrictions regarding the COVID-19 Pandemic (e.g. social distancing) in an effort to limit this patient's exposure and mitigate transmission in our community.  Due to his co-morbid illnesses, this patient is at least at moderate risk for complications without adequate follow up.  This format is felt to be most appropriate for this patient at this time.  The patient did not have access to video technology/had technical difficulties with video requiring transitioning to audio format only (telephone).  All issues noted in this document were discussed and addressed.  No physical exam could be performed with this format.  Please refer to the patient's chart for his  consent to telehealth for [facility].   Date:  12/16/2018   ID:  Dean Cole, DOB 1950-12-03, MRN [id number]  Patient Location: Home Provider Location: Home  PCP:  [name], MD  Cardiologist:   Acie Fredrickson  ( previous [facility])  Electrophysiologist:  None   Evaluation Performed:  Follow-Up Visit  Chief Complaint:   CAD   Aug. 17, 2020   Dean Cole was last seen in May, 2017.  He is a previous patient of Dr. Mare Ferrari.  He has a history of coronary artery disease, coronary artery bypass grafting, hypertension. Just finished XRT for prostate cancer. No cp or dyspnea. Ha some palpitations with the XRT.  Has had some syncopal episodes which his doctors said might happen with XRT.  Symptoms are c/w orthostasis  Has had labs at Dr. Thurman Coyer office    The patient does not have symptoms concerning for COVID-19 infection (fever, chills, cough, or new shortness of breath).    Past Medical History:  Diagnosis Date  . Anxiety   . Cancer ([place])    skin cancer  basel cell carcinoma  . Cholecystitis   . Controlled type 2 DM with peripheral circulatory disorder ([place]) 09/20/2014  . Coronary artery disease   . Depression    . Headache(784.0)   . HTN (hypertension)   . Hx of skin cancer, basal cell   . Hyperlipidemia   . Hypothyroidism   . PAD (peripheral artery disease) ([place]) 09/20/2014  . Prostate cancer ([place])   . Restless leg syndrome   . Skin cancer    melanoma   Past Surgical History:  Procedure Laterality Date  . BASAL CELL CARCINOMA EXCISION  2000   Dr,Arkin   . CARDIAC CATHETERIZATION  07/07/2011  . CHOLECYSTECTOMY  09/14/2011   Procedure: LAPAROSCOPIC CHOLECYSTECTOMY WITH INTRAOPERATIVE CHOLANGIOGRAM;  Surgeon: Gwenyth Ober, MD;  Location: [facility];  Service: General;  Laterality: N/A;  . CORONARY ARTERY BYPASS GRAFT  07/10/2011   Procedure: CORONARY ARTERY BYPASS GRAFTING (CABG);  Surgeon: Ivin Poot, MD;  Location: [facility];  Service: Open Heart Surgery;  Laterality: N/A;  . EXCISIONAL HEMORRHOIDECTOMY  1997  . HEMORROIDECTOMY  2000  . LEFT HEART CATHETERIZATION WITH CORONARY ANGIOGRAM N/A 07/07/2011   Procedure: LEFT HEART CATHETERIZATION WITH CORONARY ANGIOGRAM;  Surgeon: Burnell Blanks, MD;  Location: [facility] CATH LAB;  Service: Cardiovascular;  Laterality: N/A;     Current Meds  Medication Sig  . Calcium Carbonate-Vitamin D (CALCIUM-VITAMIN D3 PO) Take by mouth.  . diazepam (VALIUM) 5 MG tablet TAKE 1 TABLET BY MOUTH FOUR TIMES A DAY AS NEEDED FOR NERVES  . levothyroxine (SYNTHROID, LEVOTHROID) 125 MCG tablet   . metFORMIN (GLUCOPHAGE) 500 MG tablet Take 1 tablet (500 mg total) by mouth  2 (two) times daily with a meal.  . metoprolol succinate (TOPROL-XL) 25 MG 24 hr tablet TAKE 1 TABLET BY MOUTH EVERY DAY  . mometasone (ELOCON) 0.1 % cream   . NITROSTAT 0.4 MG SL tablet Place 0.4 mg under the tongue every 5 (five) minutes as needed. Reported on 10/26/2015  . ondansetron (ZOFRAN-ODT) 4 MG disintegrating tablet   . pentazocine-naloxone (TALWIN NX) 50-0.5 MG tablet One tablet daily as needed for headache  . phenazopyridine (PYRIDIUM) 200 MG tablet Take 1 tablet (200 mg total) by mouth 3  (three) times daily as needed for pain.  . polyethylene glycol (MIRALAX / GLYCOLAX) packet Take by mouth.  . simvastatin (ZOCOR) 20 MG tablet TAKE ONE TABLET BY MOUTH ONCE DAILY FOR CHOLESTEROL  . tamsulosin (FLOMAX) 0.4 MG CAPS capsule Take 0.4 mg by mouth 2 (two) times daily.  . [DISCONTINUED] aspirin EC 325 MG EC tablet Take 1 tablet (325 mg total) by mouth daily.     Allergies:   Eggs or egg-derived products and Adhesive [tape]   Social History   Tobacco Use  . Smoking status: Former Smoker    Packs/day: 1.00    Years: 21.00    Pack years: 21.00    Types: Cigarettes    Quit date: 08/08/1981    Years since quitting: 37.3  . Smokeless tobacco: Former Network engineer Use Topics  . Alcohol use: No    Comment: former  . Drug use: No     Family Hx: The patient's family history includes Coronary artery disease in his father and paternal grandfather; Leukemia in his mother; Prostate cancer in his father; Suicidality in his brother. There is no history of Breast cancer, Colon cancer, or Pancreatic cancer.  ROS:   Please see the history of present illness.     All other systems reviewed and are negative.   Prior CV studies:   The following studies were reviewed today:    Labs/Other Tests and Data Reviewed:    EKG:  No ECG reviewed.  Recent Labs: No results found for requested labs within last 8760 hours.   Recent Lipid Panel Lab Results  Component Value Date/Time   CHOL 166 04/17/2016 09:28 AM   CHOL 128 10/22/2015 08:16 AM   TRIG 242 (H) 04/17/2016 09:28 AM   HDL 34 (L) 04/17/2016 09:28 AM   HDL 30 (L) 10/22/2015 08:16 AM   CHOLHDL 4.9 04/17/2016 09:28 AM   LDLCALC 84 04/17/2016 09:28 AM   LDLCALC 48 10/22/2015 08:16 AM    Wt Readings from Last 3 Encounters:  12/16/18 190 lb (86.2 kg)  09/29/17 194 lb (88 kg)  08/21/17 186 lb (84.4 kg)     Objective:    Vital Signs:  BP 123/82 (BP Location: Left Arm, Patient Position: Sitting, Cuff Size: Normal)   Pulse  73   Temp (!) 96 F (35.6 C)   Ht 5\' 10"  (1.778 m)   Wt 190 lb (86.2 kg)   BMI 27.26 kg/m      ASSESSMENT & PLAN:    1. CAD:   Hx of CABG.   No angina.  No dysnea.   Has had some orthostatic symptoms and rare palpitations  Has had labs and ecg at Dr. Thurman Coyer office .  Will see him back in the office in 6 months for office visit, ECG, fasting lipids, liver enz, bmp   2.   Hypertension:   BP is well controlled.  3.  Hyperlipidemia: Continue simvastatin.  Will check labs  again in 6 months.  COVID-19 Education: The signs and symptoms of COVID-19 were discussed with the patient and how to seek care for testing (follow up with PCP or arrange E-visit).  The importance of social distancing was discussed today.  Time:   Today, I have spent 18 minutes with the patient with telehealth technology discussing the above problems.     Medication Adjustments/Labs and Tests Ordered: Current medicines are reviewed at length with the patient today.  Concerns regarding medicines are outlined above.   Tests Ordered: Orders Placed This Encounter  Procedures  . Basic Metabolic Panel (BMET)  . Lipid Profile  . Hepatic function panel    Medication Changes: Meds ordered this encounter  Medications  . aspirin EC 81 MG tablet    Sig: Take 1 tablet (81 mg total) by mouth daily.    Dispense:       Follow Up:  In Person in 6 month(s)  Signed, Mertie Moores, MD  12/16/2018 9:55 AM    [place]

## 2018-12-16 ENCOUNTER — Telehealth (INDEPENDENT_AMBULATORY_CARE_PROVIDER_SITE_OTHER): Payer: Medicare HMO | Admitting: Cardiovascular Disease

## 2018-12-16 ENCOUNTER — Other Ambulatory Visit: Payer: Self-pay

## 2018-12-16 ENCOUNTER — Encounter: Payer: Self-pay | Admitting: Cardiovascular Disease

## 2018-12-16 VITALS — BP 123/82 | HR 73 | Temp 96.0°F | Ht 70.0 in | Wt 190.0 lb

## 2018-12-16 DIAGNOSIS — I2510 Atherosclerotic heart disease of native coronary artery without angina pectoris: Secondary | ICD-10-CM

## 2018-12-16 DIAGNOSIS — I119 Hypertensive heart disease without heart failure: Secondary | ICD-10-CM | POA: Diagnosis not present

## 2018-12-16 DIAGNOSIS — I951 Orthostatic hypotension: Secondary | ICD-10-CM

## 2018-12-16 DIAGNOSIS — I2583 Coronary atherosclerosis due to lipid rich plaque: Secondary | ICD-10-CM | POA: Diagnosis not present

## 2018-12-16 MED ORDER — ASPIRIN EC 81 MG PO TBEC: 81.0000 mg | DELAYED_RELEASE_TABLET | Freq: Every day | ORAL | Status: DC

## 2018-12-16 NOTE — Patient Instructions (Signed)
Medication Instructions:  Your physician has recommended you make the following change in your medication:  DECREASE Aspirin to 81 mg once daily  If you need a refill on your cardiac medications before your next appointment, please call your pharmacy.     Lab work: Your physician recommends that you return for lab work in: 6 months on the day of or a few days before your office visit with Dr. Acie Fredrickson.  You will need to FAST for this appointment - nothing to eat or drink after midnight the night before except water.    Testing/Procedures: None Ordered   Follow-Up: At [facility], you and your health needs are our priority.  As part of our continuing mission to provide you with exceptional heart care, we have created designated Provider Care Teams.  These Care Teams include your primary Cardiologist (physician) and Advanced Practice Providers (APPs -  Physician Assistants and Nurse Practitioners) who all work together to provide you with the care you need, when you need it. You will need a follow up appointment in:  6 months.  Please call our office 2 months in advance to schedule this appointment.  You may see Dr. Acie Fredrickson or one of the following Advanced Practice Providers on your designated Care Team: Richardson Dopp, PA-C [place], [place] . Daune Perch, NP

## 2018-12-18 ENCOUNTER — Telehealth: Payer: Self-pay | Admitting: Radiation Oncology

## 2018-12-18 NOTE — Telephone Encounter (Signed)
Received voicemail message from patient's wife, Silva Bandy, requesting a return call. Phoned her back. Explained that appointment with Freeman Caldron, PA-C will be via telephone. She reports the patient has blisters on the bottom of his feet and passed out the other day. Stressed these symptoms are not related to the radiation therapy he completed over a week ago. Reinforced that her husband needs to be seen by his PCP for an evaluation ASAP. Explained these symptoms could be related to his diabetes and/or heart condition.Spoke with patient directly at the request of his wife and explained the same things to him. Both verbalized understanding. Silva Bandy commits to call their PCP asap.

## 2018-12-22 NOTE — Progress Notes (Signed)
  Radiation Oncology         [phone] ________________________________  Name: Dean Cole MRN: [id number]  Date: 12/12/2018  DOB: [birth year]  End of Treatment Note  Diagnosis:   68 y.o. 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 The prostate, seminal vesicles, and pelvic lymph nodes were initially treated to 45 Gy in 25 fractions of 1.8 Gy  2. The prostate only was boosted to 75 Gy with 15 additional fractions of 2.0 Gy   Beams/energy:  1. The prostate, seminal vesicles, and pelvic lymph nodes were initially treated using VMAT intensity modulated radiotherapy delivering 6 megavolt photons. Image guidance was performed with CB-CT studies prior to each fraction. He was immobilized with a body fix lower extremity mold.  2. the prostate only was boosted using VMAT intensity modulated radiotherapy delivering 6 megavolt photons. Image guidance was performed with CB-CT studies prior to each fraction. He was immobilized with a body fix lower extremity mold.  Narrative: The patient tolerated radiation treatment relatively well.   The patient experienced some minor urinary irritation and modest fatigue.    Plan: The patient has completed radiation treatment. He will return to radiation oncology clinic for routine followup in one month. I advised him to call or return sooner if he has any questions or concerns related to his recovery or treatment. ________________________________  Sheral Apley. Tammi Klippel, M.D.

## 2018-12-31 ENCOUNTER — Ambulatory Visit: Payer: Medicare HMO | Admitting: Podiatry

## 2018-12-31 ENCOUNTER — Ambulatory Visit (INDEPENDENT_AMBULATORY_CARE_PROVIDER_SITE_OTHER): Payer: Medicare HMO

## 2018-12-31 ENCOUNTER — Other Ambulatory Visit: Payer: Self-pay

## 2018-12-31 ENCOUNTER — Encounter: Payer: Self-pay | Admitting: Podiatry

## 2018-12-31 VITALS — BP 162/87 | HR 83

## 2018-12-31 DIAGNOSIS — L02619 Cutaneous abscess of unspecified foot: Secondary | ICD-10-CM

## 2018-12-31 DIAGNOSIS — L03119 Cellulitis of unspecified part of limb: Secondary | ICD-10-CM

## 2018-12-31 DIAGNOSIS — L089 Local infection of the skin and subcutaneous tissue, unspecified: Secondary | ICD-10-CM | POA: Diagnosis not present

## 2018-12-31 DIAGNOSIS — S90822A Blister (nonthermal), left foot, initial encounter: Secondary | ICD-10-CM

## 2018-12-31 DIAGNOSIS — L02612 Cutaneous abscess of left foot: Secondary | ICD-10-CM | POA: Diagnosis not present

## 2018-12-31 DIAGNOSIS — E1149 Type 2 diabetes mellitus with other diabetic neurological complication: Secondary | ICD-10-CM | POA: Diagnosis not present

## 2018-12-31 NOTE — Progress Notes (Signed)
Subjective:   Patient ID: [facility], male   DOB: 68 y.o.   MRN: [id number]   HPI 68 year old male presents the office today for concerns of a blister started about 2 weeks ago in the top of his left foot.  He is not sure of how this started. He states originally there was no pain to the area. He was using Neosporin as well as hydrocortisone cream.  Pain started 2 days ago when he follow-up with his primary care physician yesterday.  He was recommended to discontinue the topical and to keep the area clean and dry he was started on doxycycline. He is now using iodine ointment.   Recently finished 40 days of radiation for prostate cancer. Completed this treatment on 12/13/18.  Denies any fevers, nausea, vomiting. No calf pain, chest pain, SOB.    Review of Systems  All other systems reviewed and are negative.  Past Medical History:  Diagnosis Date  . Anxiety   . Cancer ([place])    skin cancer  basel cell carcinoma  . Cholecystitis   . Controlled type 2 DM with peripheral circulatory disorder ([place]) 09/20/2014  . Coronary artery disease   . Depression   . Headache(784.0)   . HTN (hypertension)   . Hx of skin cancer, basal cell   . Hyperlipidemia   . Hypothyroidism   . PAD (peripheral artery disease) ([place]) 09/20/2014  . Prostate cancer ([place])   . Restless leg syndrome   . Skin cancer    melanoma    Past Surgical History:  Procedure Laterality Date  . BASAL CELL CARCINOMA EXCISION  2000   Dr,Arkin   . CARDIAC CATHETERIZATION  07/07/2011  . CHOLECYSTECTOMY  09/14/2011   Procedure: LAPAROSCOPIC CHOLECYSTECTOMY WITH INTRAOPERATIVE CHOLANGIOGRAM;  Surgeon: Gwenyth Ober, MD;  Location: [place];  Service: General;  Laterality: N/A;  . CORONARY ARTERY BYPASS GRAFT  07/10/2011   Procedure: CORONARY ARTERY BYPASS GRAFTING (CABG);  Surgeon: Ivin Poot, MD;  Location: [place];  Service: Open Heart Surgery;  Laterality: N/A;  . EXCISIONAL HEMORRHOIDECTOMY  1997  . HEMORROIDECTOMY  2000  .  LEFT HEART CATHETERIZATION WITH CORONARY ANGIOGRAM N/A 07/07/2011   Procedure: LEFT HEART CATHETERIZATION WITH CORONARY ANGIOGRAM;  Surgeon: Burnell Blanks, MD;  Location: [facility] CATH LAB;  Service: Cardiovascular;  Laterality: N/A;     Current Outpatient Medications:  .  aspirin EC 81 MG tablet, Take 1 tablet (81 mg total) by mouth daily., Disp:  , Rfl:  .  Calcium Carbonate-Vitamin D (CALCIUM-VITAMIN D3 PO), Take by mouth., Disp: , Rfl:  .  diazepam (VALIUM) 5 MG tablet, TAKE 1 TABLET BY MOUTH FOUR TIMES A DAY AS NEEDED FOR NERVES, Disp: 120 tablet, Rfl: 0 .  levothyroxine (SYNTHROID, LEVOTHROID) 125 MCG tablet, , Disp: , Rfl:  .  metFORMIN (GLUCOPHAGE) 500 MG tablet, Take 1 tablet (500 mg total) by mouth 2 (two) times daily with a meal., Disp: 60 tablet, Rfl: 2 .  metoprolol succinate (TOPROL-XL) 25 MG 24 hr tablet, TAKE 1 TABLET BY MOUTH EVERY DAY, Disp: , Rfl:  .  mometasone (ELOCON) 0.1 % cream, , Disp: , Rfl:  .  NITROSTAT 0.4 MG SL tablet, Place 0.4 mg under the tongue every 5 (five) minutes as needed. Reported on 10/26/2015, Disp: , Rfl:  .  ondansetron (ZOFRAN-ODT) 4 MG disintegrating tablet, , Disp: , Rfl:  .  pentazocine-naloxone (TALWIN NX) 50-0.5 MG tablet, One tablet daily as needed for headache, Disp: , Rfl:  .  phenazopyridine (PYRIDIUM) 200 MG tablet, Take 1 tablet (200 mg total) by mouth 3 (three) times daily as needed for pain., Disp: 60 tablet, Rfl: 3 .  polyethylene glycol (MIRALAX / GLYCOLAX) packet, Take by mouth., Disp: , Rfl:  .  simvastatin (ZOCOR) 20 MG tablet, TAKE ONE TABLET BY MOUTH ONCE DAILY FOR CHOLESTEROL, Disp: 90 tablet, Rfl: 1 .  tamsulosin (FLOMAX) 0.4 MG CAPS capsule, Take 0.4 mg by mouth 2 (two) times daily., Disp: , Rfl:   Allergies  Allergen Reactions  . Eggs Or Egg-Derived Products Other (See Comments)    Bloating, nausea, and no rash   . Adhesive [Tape] Swelling        Objective:  Physical Exam  General: AAO x3, NAD  Dermatological:  Ulcer formations present dorsal aspect the left foot on the top of the second third toes at the level of the MPJ going into the interspace.  There was one blister that was fluid-filled right drain today and clear, blister fluid was expressed but no purulence.  There is localized cellulitis to the area but no ascending cellulitis.  There is no fluctuation crepitation.  There is no malodor.  No other open lesions.  Vascular: Dorsalis Pedis artery and Posterior Tibial artery pedal pulses are 2/4 bilateral with immedate capillary fill time.There is no pain with calf compression, swelling, warmth, erythema.   Neruologic: Sensation decreased with Semmes-Weinstein monofilament  Musculoskeletal: No gross boney pedal deformities bilateral.      Assessment:   Infected blister left foot with controlled diabetes with neuropathy     Plan:  -Treatment options discussed including all alternatives, risks, and complications -Etiology of symptoms were discussed -X-rays were obtained and reviewed with the patient. No evidence of acute fracture, osteomyelitis or soft tissue emphysema. -I cleaned the blister with alcohol  And drained the blister utilizing 18-gauge needle. I took a wound culture of the fluid.  For now we will continue doxycycline.  Recommended Betadine with a dry dressing changes daily.  I did clean some of the epidermolysis over the blisters today there was some slough off.  Granular wound base present.  Surgical shoe dispensed. -Monitor for any clinical signs or symptoms of infection and directed to call the office immediately should any occur or go to the ER.  Return in about 1 week (around 01/07/2019).  Trula Slade DPM

## 2018-12-31 NOTE — Patient Instructions (Signed)
Continue current antibiotics Wear surgical shoe Apply a small amount of betadine to the wound daily.

## 2019-01-03 LAB — WOUND CULTURE
MICRO NUMBER:: 835671
RESULT:: NO GROWTH
SPECIMEN QUALITY:: ADEQUATE

## 2019-01-10 ENCOUNTER — Ambulatory Visit: Payer: Medicare HMO | Admitting: Podiatry

## 2019-01-10 ENCOUNTER — Encounter: Payer: Self-pay | Admitting: Podiatry

## 2019-01-10 ENCOUNTER — Other Ambulatory Visit: Payer: Self-pay

## 2019-01-10 DIAGNOSIS — L089 Local infection of the skin and subcutaneous tissue, unspecified: Secondary | ICD-10-CM

## 2019-01-10 DIAGNOSIS — E1149 Type 2 diabetes mellitus with other diabetic neurological complication: Secondary | ICD-10-CM

## 2019-01-10 DIAGNOSIS — L03119 Cellulitis of unspecified part of limb: Secondary | ICD-10-CM | POA: Diagnosis not present

## 2019-01-10 DIAGNOSIS — L97501 Non-pressure chronic ulcer of other part of unspecified foot limited to breakdown of skin: Secondary | ICD-10-CM | POA: Diagnosis not present

## 2019-01-10 DIAGNOSIS — L02619 Cutaneous abscess of unspecified foot: Secondary | ICD-10-CM

## 2019-01-10 DIAGNOSIS — S90822A Blister (nonthermal), left foot, initial encounter: Secondary | ICD-10-CM

## 2019-01-10 MED ORDER — MUPIROCIN 2 % EX OINT: 1.0000 "application " | TOPICAL_OINTMENT | Freq: Two times a day (BID) | CUTANEOUS | 2 refills | Status: DC

## 2019-01-12 NOTE — Progress Notes (Signed)
Subjective: 68 year old male presents the office today for follow-up evaluation of a wound, blister on the top of his left foot.  His wife is on the phone during the appointment today.  He states that the wound is doing better.  He has finished the antibiotics.  He has been keeping Betadine on the wound.  Overall he feels it is improving.  Denies any drainage or pus or any redness or red streaks. Denies any systemic complaints such as fevers, chills, nausea, vomiting. No acute changes since last appointment, and no other complaints at this time.   Objective: AAO x3, NAD DP/PT pulses palpable bilaterally, CRT less than 3 seconds Hyperkeratotic tissue overlying the area the previous blister.  I was able to debride some of this and there is new, healthy skin underlying.  There is no drainage or pus.  No edema, erythema.  No ascending cellulitis.  No fluctuation crepitation.   No pain with calf compression, swelling, warmth, erythema      Assessment: Healing wound left foot  Plan: -All treatment options discussed with the patient including all alternatives, risks, complications.  -Mildly debrided some of the loose tissue to the any complications or bleeding utilizing #312 with scalpel.We will switch to using a small amount of mupirocin ointment which are prescribed today.  Continue offloading. -Monitor for any clinical signs or symptoms of infection and directed to call the office immediately should any occur or go to the ER. -Patient encouraged to call the office with any questions, concerns, change in symptoms.   Trula Slade DPM

## 2019-01-15 ENCOUNTER — Ambulatory Visit
Admission: RE | Admit: 2019-01-15 | Discharge: 2019-01-15 | Disposition: A | Discharge status: Home / Self Care | Payer: Medicare HMO | Source: Ambulatory Visit | Attending: Urology | Admitting: Urology

## 2019-01-15 ENCOUNTER — Telehealth: Payer: Self-pay | Admitting: *Deleted

## 2019-01-15 ENCOUNTER — Other Ambulatory Visit: Payer: Self-pay

## 2019-01-15 ENCOUNTER — Encounter: Payer: Self-pay | Admitting: Urology

## 2019-01-15 DIAGNOSIS — C61 Malignant neoplasm of prostate: Secondary | ICD-10-CM

## 2019-01-15 MED ORDER — MIRABEGRON ER 25 MG PO TB24: 25.0000 mg | ORAL_TABLET | Freq: Every day | ORAL | 2 refills | Status: DC

## 2019-01-15 NOTE — Telephone Encounter (Signed)
I called pt's wife, Silva Bandy and she states pt had red develop in the same area of the right foot as the left and she painted with betadine, but now looks like the blisters have come and went. I told Silva Bandy to apply the mupirocin ointment prescribed by Dr. Jacqualyn Posey to the right foot. Phyllis asked if circulation could cause and I told her that would need to be evaluated prior to diagnosing as circulation. Transferred Phyllis to [employer].

## 2019-01-15 NOTE — Progress Notes (Signed)
Radiation Oncology         [phone] ________________________________  Name: Dean Cole MRN: [id number]  Date: 01/15/2019  DOB: 1950-06-10  Post Treatment Note  CC: Dean Sacramento, MD  Dean Sacramento, MD  Diagnosis:   68 y.o. 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 The prostate, seminal vesicles, and pelvic lymph nodes were initially treated to 45 Gy in 25 fractions of 1.8 Gy  2. The prostate only was boosted to 75 Gy with 15 additional fractions of 2.0 Gy   Narrative:  I spoke with the patient to conduct his routine scheduled 1 month follow up visit via telephone to spare the patient unnecessary potential exposure in the healthcare setting during the current COVID-19 pandemic.  The patient was notified in advance and gave permission to proceed with this visit format.   He tolerated radiation treatment relatively well with only minor urinary irritation with mild dysuria, weak stream and nocturia 6-7 times per night. He continued taking Flomax but did not notice any significant relief. He denied blood in urine or stool. He did experience modest fatigue.                                On review of systems, the patient states that he continue to struggle with significant urgency, frequency with small volume voids, nocturia every hour at night and weak stream despite taking Flomax BID. He is unsure whether he is emptying his bladder completely on voiding but reports that at times, he has a strong, steady stream but more often he has the small volume voids and urge to void every 30-45 minutes.  He denies dysuria, gross hematuria, fever, chills or night sweats. He has continued tolerating the Lupron well despite moderate fatigue. Currently, his most bothersome concern is dealing with blisters on his feet. He is seeing a podiatrist at [facility] for management of  this.  ALLERGIES:  is allergic to eggs or egg-derived products and adhesive [tape].  Meds: Current Outpatient Medications  Medication Sig Dispense Refill   aspirin EC 81 MG tablet Take 1 tablet (81 mg total) by mouth daily.     Calcium Carbonate-Vitamin D (CALCIUM-VITAMIN D3 PO) Take by mouth.     diazepam (VALIUM) 5 MG tablet TAKE 1 TABLET BY MOUTH FOUR TIMES A DAY AS NEEDED FOR NERVES 120 tablet 0   imipramine (TOFRANIL) 25 MG tablet TAKE 3 TABLETS EVERY MORNING AND 4 TABLETS EVERY NIGHT FOR ANXIETY AND DEPRESSION     levothyroxine (SYNTHROID, LEVOTHROID) 125 MCG tablet      metFORMIN (GLUCOPHAGE) 500 MG tablet Take 1 tablet (500 mg total) by mouth 2 (two) times daily with a meal. 60 tablet 2   metoprolol succinate (TOPROL-XL) 25 MG 24 hr tablet TAKE 1 TABLET BY MOUTH EVERY DAY     mometasone (ELOCON) 0.1 % cream      mupirocin ointment (BACTROBAN) 2 % Apply 1 application topically 2 (two) times daily. 30 g 2   NITROSTAT 0.4 MG SL tablet Place 0.4 mg under the tongue every 5 (five) minutes as needed. Reported on 10/26/2015     ondansetron (ZOFRAN-ODT) 4 MG disintegrating tablet      pentazocine-naloxone (TALWIN NX) 50-0.5 MG tablet One tablet daily as needed for headache  phenazopyridine (PYRIDIUM) 200 MG tablet Take 1 tablet (200 mg total) by mouth 3 (three) times daily as needed for pain. 60 tablet 3   polyethylene glycol (MIRALAX / GLYCOLAX) packet Take by mouth.     simvastatin (ZOCOR) 20 MG tablet TAKE ONE TABLET BY MOUTH ONCE DAILY FOR CHOLESTEROL 90 tablet 1   tamsulosin (FLOMAX) 0.4 MG CAPS capsule Take 0.4 mg by mouth 2 (two) times daily.     No current facility-administered medications for this encounter.     Physical Findings:  vitals were not taken for this visit.   /Unable to assess due to telephone follow up visit format.  Lab Findings: Lab Results  Component Value Date   WBC 4.4 08/21/2017   HGB 12.5 (L) 08/21/2017   HCT 37.5 (L) 08/21/2017    MCV 86.2 08/21/2017   PLT 103 (L) 08/21/2017     Radiographic Findings: Dg Foot Complete Left  Result Date: 12/31/2018 Please see detailed radiograph report in office note.   Impression/Plan: 1. 68 y.o. 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 He will continue to follow up with urology for ongoing PSA determinations but does not currently have a scheduled appointment with Dr. Jeffie Pollock to his knowledge.  He is due for repeat Lupron injection this month so they will call to schedule this appointment. Regarding his ongoing LUTS, he will continue taking Flomax BID and we will try adding Myrbetriq 25mg  daily to see if this can help with the urgency/frequency and nocuturia. He understands what to expect with regards to PSA monitoring going forward. I will look forward to following his response to treatment via correspondence with urology, and would be happy to continue to participate in his care if clinically indicated. I talked to the patient about what to expect in the future, including his risk for erectile dysfunction and rectal bleeding. I encouraged him to call or return to the office if he has any questions regarding his previous radiation or possible radiation side effects. He was comfortable with this plan and will follow up as needed.        Dean Johns, PA-C

## 2019-01-15 NOTE — Telephone Encounter (Signed)
Pt's wife, Silva Bandy states pt is getting blisters on the other foot that hurt and when is pt's next appt.

## 2019-01-16 ENCOUNTER — Ambulatory Visit: Payer: Medicare HMO | Admitting: Podiatry

## 2019-01-24 ENCOUNTER — Other Ambulatory Visit: Payer: Self-pay

## 2019-01-24 ENCOUNTER — Ambulatory Visit: Payer: Medicare HMO | Admitting: Podiatry

## 2019-01-24 ENCOUNTER — Encounter: Payer: Self-pay | Admitting: Podiatry

## 2019-01-24 DIAGNOSIS — E1149 Type 2 diabetes mellitus with other diabetic neurological complication: Secondary | ICD-10-CM

## 2019-01-24 DIAGNOSIS — R238 Other skin changes: Secondary | ICD-10-CM

## 2019-01-24 DIAGNOSIS — B353 Tinea pedis: Secondary | ICD-10-CM

## 2019-01-24 DIAGNOSIS — G629 Polyneuropathy, unspecified: Secondary | ICD-10-CM | POA: Diagnosis not present

## 2019-01-24 MED ORDER — CLOTRIMAZOLE-BETAMETHASONE 1-0.05 % EX CREA: 1.0000 "application " | TOPICAL_CREAM | Freq: Two times a day (BID) | CUTANEOUS | 0 refills | Status: DC

## 2019-01-24 MED ORDER — GABAPENTIN 100 MG PO CAPS: 100.0000 mg | ORAL_CAPSULE | Freq: Every day | ORAL | 3 refills | Status: DC

## 2019-01-24 NOTE — Progress Notes (Signed)
Subjective: 68 year old male presents the office today for follow-up evaluation of a wound, blister on the top of his left foot.  Also he started to develop blisters on the right foot.  Patient switched to using Betadine and the blisters are resolved on the right side.  Currently denies any open sores.  He also has secondary concerns of getting a lot of burning pain to his feet mostly at nighttime he describes it more of an aggravation.  He has difficulty sleeping at night because of this. Denies any systemic complaints such as fevers, chills, nausea, vomiting. No acute changes since last appointment, and no other complaints at this time.   Objective: AAO x3, NAD DP/PT pulses palpable bilaterally, CRT less than 3 seconds The wound on the left foot is healed.  There is evidence of old blister on the right foot there is a well-healed as well.Marland Kitchen  Appears to be some dry, erythematous skin almost more of tinea pedis type infection on the right foot.  There is no active blisters or open sores identified bilaterally. Also describing burning pain to both of his feet mostly to the toes at nighttime.  Bunion deformities present. No pain with calf compression, swelling, erythema  Assessment: Heel bilaterally, tinea pedis, neuropathy  Plan: -All treatment options discussed with the patient including all alternatives, risks, complications.  -The wounds of healed.  This appears to be more of a fungal type infection.  Prescribed Lotrisone cream. -Likely neuropathy symptoms and start gabapentin.  This was prescribed and discussed medication.  Can titrate the dose based on his response as well as as long as he can tolerate medication.  Return in about 4 weeks (around 02/21/2019).  Trula Slade DPM

## 2019-01-24 NOTE — Patient Instructions (Signed)

## 2019-02-21 ENCOUNTER — Ambulatory Visit: Payer: Medicare HMO | Admitting: Podiatry

## 2019-02-21 ENCOUNTER — Other Ambulatory Visit: Payer: Self-pay

## 2019-02-21 DIAGNOSIS — M2041 Other hammer toe(s) (acquired), right foot: Secondary | ICD-10-CM

## 2019-02-21 DIAGNOSIS — E1149 Type 2 diabetes mellitus with other diabetic neurological complication: Secondary | ICD-10-CM

## 2019-02-21 DIAGNOSIS — Z872 Personal history of diseases of the skin and subcutaneous tissue: Secondary | ICD-10-CM

## 2019-02-21 DIAGNOSIS — M2042 Other hammer toe(s) (acquired), left foot: Secondary | ICD-10-CM

## 2019-02-21 MED ORDER — GABAPENTIN 100 MG PO CAPS: 100.0000 mg | ORAL_CAPSULE | Freq: Three times a day (TID) | ORAL | 3 refills | Status: DC

## 2019-02-21 NOTE — Patient Instructions (Signed)
Diabetes Mellitus and Foot Care Foot care is an important part of your health, especially when you have diabetes. Diabetes may cause you to have problems because of poor blood flow (circulation) to your feet and legs, which can cause your skin to:  Become thinner and drier.  Break more easily.  Heal more slowly.  Peel and crack. You may also have nerve damage (neuropathy) in your legs and feet, causing decreased feeling in them. This means that you may not notice minor injuries to your feet that could lead to more serious problems. Noticing and addressing any potential problems early is the best way to prevent future foot problems. How to care for your feet Foot hygiene  Wash your feet daily with warm water and mild soap. Do not use hot water. Then, pat your feet and the areas between your toes until they are completely dry. Do not soak your feet as this can dry your skin.  Trim your toenails straight across. Do not dig under them or around the cuticle. File the edges of your nails with an emery board or nail file.  Apply a moisturizing lotion or petroleum jelly to the skin on your feet and to dry, brittle toenails. Use lotion that does not contain alcohol and is unscented. Do not apply lotion between your toes. Shoes and socks  Wear clean socks or stockings every day. Make sure they are not too tight. Do not wear knee-high stockings since they may decrease blood flow to your legs.  Wear shoes that fit properly and have enough cushioning. Always look in your shoes before you put them on to be sure there are no objects inside.  To break in new shoes, wear them for just a few hours a day. This prevents injuries on your feet. Wounds, scrapes, corns, and calluses  Check your feet daily for blisters, cuts, bruises, sores, and redness. If you cannot see the bottom of your feet, use a mirror or ask someone for help.  Do not cut corns or calluses or try to remove them with medicine.  If you  find a minor scrape, cut, or break in the skin on your feet, keep it and the skin around it clean and dry. You may clean these areas with mild soap and water. Do not clean the area with peroxide, alcohol, or iodine.  If you have a wound, scrape, corn, or callus on your foot, look at it several times a day to make sure it is healing and not infected. Check for: ? Redness, swelling, or pain. ? Fluid or blood. ? Warmth. ? Pus or a bad smell. General instructions  Do not cross your legs. This may decrease blood flow to your feet.  Do not use heating pads or hot water bottles on your feet. They may burn your skin. If you have lost feeling in your feet or legs, you may not know this is happening until it is too late.  Protect your feet from hot and cold by wearing shoes, such as at the beach or on hot pavement.  Schedule a complete foot exam at least once a year (annually) or more often if you have foot problems. If you have foot problems, report any cuts, sores, or bruises to your health care provider immediately. Contact a health care provider if:  You have a medical condition that increases your risk of infection and you have any cuts, sores, or bruises on your feet.  You have an injury that is not   healing.  You have redness on your legs or feet.  You feel burning or tingling in your legs or feet.  You have pain or cramps in your legs and feet.  Your legs or feet are numb.  Your feet always feel cold.  You have pain around a toenail. Get help right away if:  You have a wound, scrape, corn, or callus on your foot and: ? You have pain, swelling, or redness that gets worse. ? You have fluid or blood coming from the wound, scrape, corn, or callus. ? Your wound, scrape, corn, or callus feels warm to the touch. ? You have pus or a bad smell coming from the wound, scrape, corn, or callus. ? You have a fever. ? You have a red line going up your leg. Summary  Check your feet every day  for cuts, sores, red spots, swelling, and blisters.  Moisturize feet and legs daily.  Wear shoes that fit properly and have enough cushioning.  If you have foot problems, report any cuts, sores, or bruises to your health care provider immediately.  Schedule a complete foot exam at least once a year (annually) or more often if you have foot problems. This information is not intended to replace advice given to you by your health care provider. Make sure you discuss any questions you have with your health care provider. Document Released: 04/14/2000 Document Revised: 05/30/2017 Document Reviewed: 05/19/2016 Elsevier Patient Education  [place].   Gabapentin capsules or tablets What is this medicine? GABAPENTIN (GA ba pen tin) is used to control seizures in certain types of epilepsy. It is also used to treat certain types of nerve pain. This medicine may be used for other purposes; ask your health care provider or pharmacist if you have questions. COMMON BRAND NAME(S): Active-PAC with Gabapentin, Gabarone, Neurontin What should I tell my health care provider before I take this medicine? They need to know if you have any of these conditions:  history of drug abuse or alcohol abuse problem  kidney disease  lung or breathing disease  suicidal thoughts, plans, or attempt; a previous suicide attempt by you or a family member  an unusual or allergic reaction to gabapentin, other medicines, foods, dyes, or preservatives  pregnant or trying to get pregnant  breast-feeding How should I use this medicine? Take this medicine by mouth with a glass of water. Follow the directions on the prescription label. You can take it with or without food. If it upsets your stomach, take it with food. Take your medicine at regular intervals. Do not take it more often than directed. Do not stop taking except on your doctor's advice. If you are directed to break the 600 or 800 mg tablets in half as  part of your dose, the extra half tablet should be used for the next dose. If you have not used the extra half tablet within 28 days, it should be thrown away. A special MedGuide will be given to you by the pharmacist with each prescription and refill. Be sure to read this information carefully each time. Talk to your pediatrician regarding the use of this medicine in children. While this drug may be prescribed for children as young as 3 years for selected conditions, precautions do apply. Overdosage: If you think you have taken too much of this medicine contact a poison control center or emergency room at once. NOTE: This medicine is only for you. Do not share this medicine with others. What if  I miss a dose? If you miss a dose, take it as soon as you can. If it is almost time for your next dose, take only that dose. Do not take double or extra doses. What may interact with this medicine? This medicine may interact with the following medications:  alcohol  antihistamines for allergy, cough, and cold  certain medicines for anxiety or sleep  certain medicines for depression like amitriptyline, fluoxetine, sertraline  certain medicines for seizures like phenobarbital, primidone  certain medicines for stomach problems  general anesthetics like halothane, isoflurane, methoxyflurane, propofol  local anesthetics like lidocaine, pramoxine, tetracaine  medicines that relax muscles for surgery  narcotic medicines for pain  phenothiazines like chlorpromazine, mesoridazine, prochlorperazine, thioridazine This list may not describe all possible interactions. Give your health care provider a list of all the medicines, herbs, non-prescription drugs, or dietary supplements you use. Also tell them if you smoke, drink alcohol, or use illegal drugs. Some items may interact with your medicine. What should I watch for while using this medicine? Visit your doctor or health care provider for regular  checks on your progress. You may want to keep a record at home of how you feel your condition is responding to treatment. You may want to share this information with your doctor or health care provider at each visit. You should contact your doctor or health care provider if your seizures get worse or if you have any new types of seizures. Do not stop taking this medicine or any of your seizure medicines unless instructed by your doctor or health care provider. Stopping your medicine suddenly can increase your seizures or their severity. This medicine may cause serious skin reactions. They can happen weeks to months after starting the medicine. Contact your health care provider right away if you notice fevers or flu-like symptoms with a rash. The rash may be red or purple and then turn into blisters or peeling of the skin. Or, you might notice a red rash with swelling of the face, lips or lymph nodes in your neck or under your arms. Wear a medical identification bracelet or chain if you are taking this medicine for seizures, and carry a card that lists all your medications. You may get drowsy, dizzy, or have blurred vision. Do not drive, use machinery, or do anything that needs mental alertness until you know how this medicine affects you. To reduce dizzy or fainting spells, do not sit or stand up quickly, especially if you are an older patient. Alcohol can increase drowsiness and dizziness. Avoid alcoholic drinks. Your mouth may get dry. Chewing sugarless gum or sucking hard candy, and drinking plenty of water will help. The use of this medicine may increase the chance of suicidal thoughts or actions. Pay special attention to how you are responding while on this medicine. Any worsening of mood, or thoughts of suicide or dying should be reported to your health care provider right away. Women who become pregnant while using this medicine may enroll in the [place] Pregnancy Registry by  calling [phone]. This registry collects information about the safety of antiepileptic drug use during pregnancy. What side effects may I notice from receiving this medicine? Side effects that you should report to your doctor or health care professional as soon as possible:  allergic reactions like skin rash, itching or hives, swelling of the face, lips, or tongue  breathing problems  rash, fever, and swollen lymph nodes  redness, blistering, peeling or loosening of the  skin, including inside the mouth  suicidal thoughts, mood changes Side effects that usually do not require medical attention (report to your doctor or health care professional if they continue or are bothersome):  dizziness  drowsiness  headache  nausea, vomiting  swelling of ankles, feet, hands  tiredness This list may not describe all possible side effects. Call your doctor for medical advice about side effects. You may report side effects to FDA at 1-800-FDA-1088. Where should I keep my medicine? Keep out of reach of children. This medicine may cause accidental overdose and death if it taken by other adults, children, or pets. Mix any unused medicine with a substance like cat litter or coffee grounds. Then throw the medicine away in a sealed container like a sealed bag or a coffee can with a lid. Do not use the medicine after the expiration date. Store at room temperature between 15 and 30 degrees C (59 and 86 degrees F). NOTE: This sheet is a summary. It may not cover all possible information. If you have questions about this medicine, talk to your doctor, pharmacist, or health care provider.  2020 Elsevier/Gold Standard (2018-07-19 14:16:43)

## 2019-02-21 NOTE — Progress Notes (Signed)
Subjective: 68 year old male presents of supervised evaluation of neuropathy as well as the area of the wound on the left foot.  The wound is healed.  He has been taking the gabapentin nighttime not see much improvement.  He states he still gets numbness to his feet but he also feels he has tight bands around his big toes as well as occasional burning to his feet.  He has been using Lotrisone cream for athlete's foot.Denies any systemic complaints such as fevers, chills, nausea, vomiting. No acute changes since last appointment, and no other complaints at this time.   Objective: AAO x3, NAD DP/PT pulses palpable bilaterally, CRT less than 3 seconds Sensation decreased with Semmes Weinstein monofilament Wounds are healed there is no current ulceration identified. Hammertoe contractures are present. Nails are hypertrophic, dystrophic with yellow-brown discoloration and are causing irritation inside shoes.  No edema or erythema or any signs of infection. No pain with calf compression, swelling, warmth, erythema  Assessment: 68 year old male with neuropathy, symptomatic onychomycosis  Plan: -All treatment options discussed with the patient including all alternatives, risks, complications.  -Nails are very x10 without any complications or bleeding -We will increase gabapentin 1 mg 3 times a day.  Has not had any side effects medication but continue to monitor. 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  Note to check on cost and we will let him know negative point if he desires diabetic shoes. -Patient encouraged to call the office with any questions, concerns, change in symptoms.   Trula Slade DPM

## 2019-03-26 ENCOUNTER — Telehealth: Payer: Self-pay | Admitting: Podiatry

## 2019-03-26 NOTE — Telephone Encounter (Signed)
pts wife Silva Bandy left message about pt getting Diabetic shoes. But pt had a ppt at 300 with cancer doctor.  I returned call and left message with a gentleman(Not pt) to tell her I call her on Monday.

## 2019-04-17 ENCOUNTER — Telehealth: Payer: Self-pay | Admitting: Radiation Oncology

## 2019-04-17 NOTE — Telephone Encounter (Signed)
Received refill request for patient's Myrbetriq. Faxed denial back to pharmacy. Confirmation fax of delivery obtained.   Wrote note on fax recommended patient request refill from Dr. Jeffie Pollock at [facility]. Patient released back to Dr. Jeffie Pollock following radiation in September.

## 2019-04-27 ENCOUNTER — Other Ambulatory Visit: Payer: Self-pay | Admitting: Urology

## 2019-04-27 ENCOUNTER — Other Ambulatory Visit: Payer: Self-pay | Admitting: Podiatry

## 2019-05-26 ENCOUNTER — Ambulatory Visit: Payer: Medicare HMO | Admitting: Orthotics

## 2019-05-26 ENCOUNTER — Other Ambulatory Visit: Payer: Self-pay

## 2019-05-26 ENCOUNTER — Encounter: Payer: Self-pay | Admitting: Podiatry

## 2019-05-26 ENCOUNTER — Ambulatory Visit: Payer: Medicare HMO | Admitting: Podiatry

## 2019-05-26 VITALS — Temp 96.1°F

## 2019-05-26 DIAGNOSIS — B351 Tinea unguium: Secondary | ICD-10-CM | POA: Diagnosis not present

## 2019-05-26 DIAGNOSIS — M79674 Pain in right toe(s): Secondary | ICD-10-CM | POA: Diagnosis not present

## 2019-05-26 DIAGNOSIS — M79675 Pain in left toe(s): Secondary | ICD-10-CM

## 2019-05-26 DIAGNOSIS — E1149 Type 2 diabetes mellitus with other diabetic neurological complication: Secondary | ICD-10-CM | POA: Diagnosis not present

## 2019-05-26 NOTE — Progress Notes (Signed)
Subjective: 69 year old male presents to the office for follow-up evaluation of neuropathy as well as for elongated tenderness that he cannot trim himself.  Denies any open sores currently except for he does have a puppy who scratched the top of his feet but denies any current open sores or swelling or redness.  He does take gabapentin but not always as prescribed. Denies any systemic complaints such as fevers, chills, nausea, vomiting. No acute changes since last appointment, and no other complaints at this time.   Objective: AAO x3, NAD DP/PT pulses palpable bilaterally, CRT less than 3 seconds Sensation decreased with Semmes Weinstein monofilament Wounds are healed there is no current ulceration identified.  Superficial scratches are present in the dorsal right foot but no signs of infection. Hammertoe contractures are present. Nails are hypertrophic, dystrophic with yellow-brown discoloration and are causing irritation inside shoes.  No edema or erythema or any signs of infection. No pain with calf compression, swelling, warmth, erythema  Assessment: 69 year old male with neuropathy, symptomatic onychomycosis  Plan: -All treatment options discussed with the patient including all alternatives, risks, complications.  -Nails were sharply debrided x10 without any complications or bleeding -Continue current dose of gabapentin-discussed taking as prescribed. -Discussed the importance of the foot inspection  Return in about 3 months (around 08/24/2019).  Trula Slade DPM

## 2019-05-29 DIAGNOSIS — G894 Chronic pain syndrome: Secondary | ICD-10-CM | POA: Insufficient documentation

## 2019-05-30 ENCOUNTER — Other Ambulatory Visit: Payer: Medicare HMO | Admitting: Orthotics

## 2019-06-10 ENCOUNTER — Ambulatory Visit: Payer: Medicare HMO | Admitting: Orthotics

## 2019-06-10 ENCOUNTER — Other Ambulatory Visit: Payer: Self-pay

## 2019-06-10 DIAGNOSIS — L97501 Non-pressure chronic ulcer of other part of unspecified foot limited to breakdown of skin: Secondary | ICD-10-CM

## 2019-06-10 DIAGNOSIS — Z872 Personal history of diseases of the skin and subcutaneous tissue: Secondary | ICD-10-CM

## 2019-06-10 DIAGNOSIS — L84 Corns and callosities: Secondary | ICD-10-CM

## 2019-06-10 DIAGNOSIS — E1149 Type 2 diabetes mellitus with other diabetic neurological complication: Secondary | ICD-10-CM

## 2019-06-10 NOTE — Progress Notes (Signed)

## 2019-06-13 ENCOUNTER — Encounter (HOSPITAL_COMMUNITY): Payer: Self-pay

## 2019-06-13 ENCOUNTER — Emergency Department (HOSPITAL_COMMUNITY)
Admission: EM | Admit: 2019-06-13 | Discharge: 2019-06-13 | Disposition: A | Discharge status: Home / Self Care | Payer: Medicare HMO | Attending: Emergency Medicine | Admitting: Emergency Medicine

## 2019-06-13 ENCOUNTER — Emergency Department (HOSPITAL_COMMUNITY): Payer: Medicare HMO

## 2019-06-13 ENCOUNTER — Other Ambulatory Visit: Payer: Self-pay

## 2019-06-13 DIAGNOSIS — F17200 Nicotine dependence, unspecified, uncomplicated: Secondary | ICD-10-CM | POA: Insufficient documentation

## 2019-06-13 DIAGNOSIS — Z85828 Personal history of other malignant neoplasm of skin: Secondary | ICD-10-CM | POA: Diagnosis not present

## 2019-06-13 DIAGNOSIS — R079 Chest pain, unspecified: Secondary | ICD-10-CM | POA: Insufficient documentation

## 2019-06-13 DIAGNOSIS — E039 Hypothyroidism, unspecified: Secondary | ICD-10-CM | POA: Diagnosis not present

## 2019-06-13 DIAGNOSIS — Z20822 Contact with and (suspected) exposure to covid-19: Secondary | ICD-10-CM | POA: Insufficient documentation

## 2019-06-13 DIAGNOSIS — I119 Hypertensive heart disease without heart failure: Secondary | ICD-10-CM | POA: Diagnosis not present

## 2019-06-13 DIAGNOSIS — Z7982 Long term (current) use of aspirin: Secondary | ICD-10-CM | POA: Insufficient documentation

## 2019-06-13 DIAGNOSIS — E119 Type 2 diabetes mellitus without complications: Secondary | ICD-10-CM | POA: Insufficient documentation

## 2019-06-13 DIAGNOSIS — Z7984 Long term (current) use of oral hypoglycemic drugs: Secondary | ICD-10-CM | POA: Diagnosis not present

## 2019-06-13 DIAGNOSIS — E1151 Type 2 diabetes mellitus with diabetic peripheral angiopathy without gangrene: Secondary | ICD-10-CM | POA: Insufficient documentation

## 2019-06-13 LAB — BASIC METABOLIC PANEL
Anion gap: 11 (ref 5–15)
BUN: 12 mg/dL (ref 8–23)
CO2: 31 mmol/L (ref 22–32)
Calcium: 9.5 mg/dL (ref 8.9–10.3)
Chloride: 94 mmol/L — ABNORMAL LOW (ref 98–111)
Creatinine, Ser: 1.05 mg/dL (ref 0.61–1.24)
GFR calc Af Amer: >60 mL/min (ref >60)
GFR calc non Af Amer: >60 mL/min (ref >60)
Glucose, Bld: 288 mg/dL — ABNORMAL HIGH (ref 70–99)
Potassium: 3.8 mmol/L (ref 3.5–5.1)
Sodium: 136 mmol/L (ref 135–145)

## 2019-06-13 LAB — CBC WITH DIFFERENTIAL/PLATELET
Abs Immature Granulocytes: 0.07 10*3/uL (ref 0.00–0.07)
Basophils Absolute: 0 10*3/uL (ref 0.0–0.1)
Basophils Relative: 1 %
Eosinophils Absolute: 0.1 10*3/uL (ref 0.0–0.5)
Eosinophils Relative: 2 %
HCT: 44.7 % (ref 39.0–52.0)
Hemoglobin: 14.7 g/dL (ref 13.0–17.0)
Immature Granulocytes: 1 %
Lymphocytes Relative: 23 %
Lymphs Abs: 1.1 10*3/uL (ref 0.7–4.0)
MCH: 29.6 pg (ref 26.0–34.0)
MCHC: 32.9 g/dL (ref 30.0–36.0)
MCV: 89.9 fL (ref 80.0–100.0)
Monocytes Absolute: 0.4 10*3/uL (ref 0.1–1.0)
Monocytes Relative: 8 %
Neutro Abs: 3.2 10*3/uL (ref 1.7–7.7)
Neutrophils Relative %: 65 %
Platelets: 133 10*3/uL — ABNORMAL LOW (ref 150–400)
RBC: 4.97 MIL/uL (ref 4.22–5.81)
RDW: 13.5 % (ref 11.5–15.5)
WBC: 4.9 10*3/uL (ref 4.0–10.5)
nRBC: 0 % (ref 0.0–0.2)

## 2019-06-13 LAB — SARS CORONAVIRUS 2 (TAT 6-24 HRS): SARS Coronavirus 2: NEGATIVE

## 2019-06-13 LAB — TROPONIN I (HIGH SENSITIVITY)
Troponin I (High Sensitivity): <2 ng/L (ref <18)
Troponin I (High Sensitivity): <2 ng/L (ref <18)

## 2019-06-13 LAB — D-DIMER, QUANTITATIVE: D-Dimer, Quant: 0.39 ug/mL-FEU (ref 0.00–0.50)

## 2019-06-13 MED ORDER — DOXYCYCLINE HYCLATE 100 MG PO CAPS: 100.0000 mg | ORAL_CAPSULE | Freq: Two times a day (BID) | ORAL | 0 refills | Status: DC

## 2019-06-13 NOTE — Discharge Instructions (Signed)
Return for new or worsening symptoms especially chest pain that will not go away, shortness of breath or if you pass out.  Follow-up with your doctor as directed. Take antibiotics as prescribed.

## 2019-06-13 NOTE — ED Provider Notes (Signed)
[facility] Provider Note   CSN: [id number] Arrival date & time: 06/13/19  [id number]     History Chief Complaint  Patient presents with  . Chest Pain    Dean Cole is a 69 y.o. male.  Patient presents to the emergency department for evaluation of chest pain.  Patient had onset of pain in the center of his chest this morning.  He reports that he had gotten up out of bed and picked up his 30 pound puppy and immediately felt pain in the chest.  He took his morning medications before coming to the ER.  At arrival pain has resolved.  He denies associated nausea, diaphoresis, shortness of breath.        Past Medical History:  Diagnosis Date  . Anxiety   . Cancer ([place])    skin cancer  basel cell carcinoma  . Cholecystitis   . Controlled type 2 DM with peripheral circulatory disorder ([place]) 09/20/2014  . Coronary artery disease   . Depression   . Headache(784.0)   . HTN (hypertension)   . Hx of skin cancer, basal cell   . Hyperlipidemia   . Hypothyroidism   . PAD (peripheral artery disease) ([place]) 09/20/2014  . Prostate cancer ([place])   . Restless leg syndrome   . Skin cancer    melanoma    Patient Active Problem List   Diagnosis Date Noted  . Malignant neoplasm of prostate ([place]) 07/23/2018  . Cellulitis of toe 05/23/2018  . Acquired deformity of toenail 05/15/2018  . Foot callus 04/10/2018  . Long toenail 04/10/2018  . Encounter for Medicare annual wellness exam 12/19/2017  . Ureteral stone with hydronephrosis 08/04/2017  . Anxiety, generalized 03/19/2017  . Essential hypertension 03/19/2017  . Moderate episode of recurrent major depressive disorder ([place]) 03/19/2017  . Hearing loss 04/19/2016  . PAD (peripheral artery disease) ([place]) 09/20/2014  . Controlled type 2 DM with peripheral circulatory disorder ([place]) 09/20/2014  . Depression 09/16/2014  . Erectile dysfunction 07/02/2014  . Type II diabetes mellitus with renal manifestations ([place])  03/04/2014  . Eczema 12/25/2012  . Anxiety   . Dyslipidemia 02/29/2012  . Hypertensive heart disease without CHF   . Hypothyroidism   . Coronary artery disease   . Basal cell carcinoma   . Headache   . Restless leg syndrome   . Melanoma of skin ([place]) 06/14/2001    Past Surgical History:  Procedure Laterality Date  . BASAL CELL CARCINOMA EXCISION  2000   Dr,Arkin   . CARDIAC CATHETERIZATION  07/07/2011  . CHOLECYSTECTOMY  09/14/2011   Procedure: LAPAROSCOPIC CHOLECYSTECTOMY WITH INTRAOPERATIVE CHOLANGIOGRAM;  Surgeon: Gwenyth Ober, MD;  Location: [facility];  Service: General;  Laterality: N/A;  . CORONARY ARTERY BYPASS GRAFT  07/10/2011   Procedure: CORONARY ARTERY BYPASS GRAFTING (CABG);  Surgeon: Ivin Poot, MD;  Location: [facility];  Service: Open Heart Surgery;  Laterality: N/A;  . EXCISIONAL HEMORRHOIDECTOMY  1997  . HEMORROIDECTOMY  2000  . LEFT HEART CATHETERIZATION WITH CORONARY ANGIOGRAM N/A 07/07/2011   Procedure: LEFT HEART CATHETERIZATION WITH CORONARY ANGIOGRAM;  Surgeon: Burnell Blanks, MD;  Location: [facility] CATH LAB;  Service: Cardiovascular;  Laterality: N/A;       Family History  Problem Relation Age of Onset  . Coronary artery disease Father   . Prostate cancer Father   . Leukemia Mother   . Suicidality Brother   . Coronary artery disease Paternal Grandfather   . Breast cancer Neg Hx   .  Colon cancer Neg Hx   . Pancreatic cancer Neg Hx     Social History   Tobacco Use  . Smoking status: Former Smoker    Packs/day: 1.00    Years: 21.00    Pack years: 21.00    Types: Cigarettes    Quit date: 08/08/1981    Years since quitting: 37.8  . Smokeless tobacco: Former Network engineer Use Topics  . Alcohol use: No    Comment: former  . Drug use: No    Home Medications Prior to Admission medications   Medication Sig Start Date End Date Taking? Authorizing Provider  aspirin EC 81 MG tablet Take 1 tablet (81 mg total) by mouth daily. 12/16/18   Nahser,  Wonda Cheng, MD  Calcium Carbonate-Vitamin D (CALCIUM-VITAMIN D3 PO) Take by mouth.    [provider]  clotrimazole-betamethasone (LOTRISONE) cream APPLY TO AFFECTED AREA TWICE A DAY 04/28/19   Trula Slade, DPM  diazepam (VALIUM) 5 MG tablet TAKE 1 TABLET BY MOUTH FOUR TIMES A DAY AS NEEDED FOR NERVES 02/21/17   Reed, Tiffany L, DO  gabapentin (NEURONTIN) 100 MG capsule Take 1 capsule (100 mg total) by mouth 3 (three) times daily. 02/21/19   Trula Slade, DPM  imipramine (TOFRANIL) 25 MG tablet TAKE 3 TABLETS EVERY MORNING AND 4 TABLETS EVERY NIGHT FOR ANXIETY AND DEPRESSION 12/23/18   [provider]  levothyroxine (SYNTHROID, LEVOTHROID) 125 MCG tablet  06/17/18   [provider]  metFORMIN (GLUCOPHAGE) 500 MG tablet Take 1 tablet (500 mg total) by mouth 2 (two) times daily with a meal. 08/12/15   Mast, Man X, NP  metoprolol succinate (TOPROL-XL) 25 MG 24 hr tablet TAKE 1 TABLET BY MOUTH EVERY DAY 07/17/18   [provider]  mometasone (ELOCON) 0.1 % cream  06/19/18   [provider]  mupirocin ointment (BACTROBAN) 2 % Apply 1 application topically 2 (two) times daily. 01/10/19   Trula Slade, DPM  MYRBETRIQ 25 MG TB24 tablet TAKE 1 TABLET BY MOUTH EVERY DAY 04/27/19   Bruning, Ashlyn, PA-C  NITROSTAT 0.4 MG SL tablet Place 0.4 mg under the tongue every 5 (five) minutes as needed. Reported on 10/26/2015 07/06/11   [provider]  ondansetron (ZOFRAN-ODT) 4 MG disintegrating tablet  05/21/18   [provider]  pentazocine-naloxone (TALWIN NX) 50-0.5 MG tablet One tablet daily as needed for headache 07/18/18   [provider]  phenazopyridine (PYRIDIUM) 200 MG tablet Take 1 tablet (200 mg total) by mouth 3 (three) times daily as needed for pain. 11/29/18   Tyler Pita, MD  polyethylene glycol [facility] / Floria Raveling) packet Take by mouth.    [provider]  simvastatin (ZOCOR) 20 MG tablet TAKE ONE TABLET BY  MOUTH ONCE DAILY FOR CHOLESTEROL 10/16/16   Reed, Tiffany L, DO  tamsulosin (FLOMAX) 0.4 MG CAPS capsule Take 0.4 mg by mouth 2 (two) times daily.    [provider]    Allergies    Eggs or egg-derived products and Adhesive [tape]  Review of Systems   Review of Systems  Constitutional: Negative for fever.  Respiratory: Negative for cough.   Cardiovascular: Positive for chest pain.  All other systems reviewed and are negative.   Physical Exam Updated Vital Signs BP 131/77   Pulse 92   Temp 98.9 F (37.2 C) (Oral)   Resp (!) 31   Ht 5\' 10"  (1.778 m)   Wt 84.8 kg   SpO2 94%   BMI  26.83 kg/m   Physical Exam Vitals and nursing note reviewed.  Constitutional:      General: He is not in acute distress.    Appearance: Normal appearance. He is well-developed.  HENT:     Head: Normocephalic and atraumatic.     Right Ear: Hearing normal.     Left Ear: Hearing normal.     Nose: Nose normal.  Eyes:     Conjunctiva/sclera: Conjunctivae normal.     Pupils: Pupils are equal, round, and reactive to light.  Cardiovascular:     Rate and Rhythm: Regular rhythm.     Heart sounds: S1 normal and S2 normal. No murmur. No friction rub. No gallop.   Pulmonary:     Effort: Pulmonary effort is normal. No respiratory distress.     Breath sounds: Normal breath sounds.  Chest:     Chest wall: No tenderness.  Abdominal:     General: Bowel sounds are normal.     Palpations: Abdomen is soft.     Tenderness: There is no abdominal tenderness. There is no guarding or rebound. Negative signs include Murphy's sign and McBurney's sign.     Hernia: No hernia is present.  Musculoskeletal:        General: Normal range of motion.     Cervical back: Normal range of motion and neck supple.  Skin:    General: Skin is warm and dry.     Findings: No rash.  Neurological:     Mental Status: He is alert and oriented to person, place, and time.     GCS: GCS eye subscore is 4. GCS verbal subscore is  5. GCS motor subscore is 6.     Cranial Nerves: No cranial nerve deficit.     Sensory: No sensory deficit.     Coordination: Coordination normal.  Psychiatric:        Speech: Speech normal.        Behavior: Behavior normal.        Thought Content: Thought content normal.     ED Results / Procedures / Treatments   Labs (all labs ordered are listed, but only abnormal results are displayed) Labs Reviewed  CBC WITH DIFFERENTIAL/PLATELET - Abnormal; Notable for the following components:      Result Value   Platelets 133 (*)    All other components within normal limits  BASIC METABOLIC PANEL - Abnormal; Notable for the following components:   Chloride 94 (*)    Glucose, Bld 288 (*)    All other components within normal limits  TROPONIN I (HIGH SENSITIVITY)    EKG EKG Interpretation  Date/Time:  Friday June 13 2019 05:31:44 EST Ventricular Rate:  96 PR Interval:    QRS Duration: 110 QT Interval:  376 QTC Calculation: 476 R Axis:   36 Text Interpretation: Sinus rhythm Probable left atrial enlargement Borderline prolonged QT interval Confirmed by Orpah Greek [phone]) on 06/13/2019 6:38:11 AM  Radiology DG Chest Port 1 View  Result Date: 06/13/2019 CLINICAL DATA:  Chest pain after lifting EXAM: PORTABLE CHEST 1 VIEW COMPARISON:  08/21/2017 FINDINGS: Streaky opacity on both sides, atelectatic appearing. Normal heart size and stable mediastinal contours. CABG. No acute osseous finding. Sternal wires are in line and intact. IMPRESSION: Bilateral atelectasis. Electronically Signed   By: Monte Fantasia M.D.   On: 06/13/2019 06:10    Procedures Procedures (including critical care time)  Medications Ordered in ED Medications - No data to display  ED Course  I have reviewed  the triage vital signs and the nursing notes.  Pertinent labs & imaging results that were available during my care of the patient were reviewed by me and considered in my medical decision making  (see chart for details).    MDM Rules/Calculators/A&P                      Patient with previous history of coronary artery disease, status post bypass surgery presents to the emergency department for evaluation of chest pain.  Patient had sudden onset of sharp pain in the chest after lifting his 30 pound puppy.  By the time he arrived in the ER, pain is now resolved.  He took baby aspirin prior to arrival.  He did not take any nitro.  Patient's EKG is unclear change from previous.  First troponin is negative.  Second troponin is pending.  Symptoms seem to be very low likelihood for cardiac etiology, will not require further cardiac evaluation of second troponin is negative.  Patient's wife now tells me that he did start having some cough and chest congestion yesterday.  He was treated for pneumonia approximately 1 month ago.  Chest x-ray does not show any clear pneumonia but he does have bilateral streaky opacities felt to be atelectasis.  Will send Covid testing.  Feel PE is unlikely, will send D-dimer.  If negative, no further work-up.  Will sign out to oncoming ER physician.  If troponins are negative, patient can be discharged, will empirically provide antibiotic coverage for lung and have follow-up with primary care.  Final Clinical Impression(s) / ED Diagnoses Final diagnoses:  Chest pain, unspecified type    Rx / DC Orders ED Discharge Orders    None       Orpah Greek, MD 06/13/19 [phone]

## 2019-06-13 NOTE — ED Triage Notes (Signed)
Pt brought in for chest pain after lifting puppy (30 lb) per spouse report. Pt is very hard of hearing. Pt has hx of prostate CA- last chemo tx in August 2020. Pt also reports dizziness and chest pain radiating down left arm. Pt took baby ASA at home. Pt denies chest pain at this time.

## 2019-06-13 NOTE — ED Provider Notes (Signed)
Patient CARE signed out to follow-up second troponin.  Patient presented with atypical chest pain and starting of congestion cough.  Chest x-ray mild opacities.  Patient care signed out to discharge if troponin negative with doxycycline and close outpatient follow-up.  Golda Acre, MD 06/13/19 [phone]

## 2019-07-06 ENCOUNTER — Emergency Department (HOSPITAL_COMMUNITY): Payer: Medicare Other

## 2019-07-06 ENCOUNTER — Inpatient Hospital Stay (HOSPITAL_COMMUNITY)
Admission: EM | Admit: 2019-07-06 | Discharge: 2019-07-08 | DRG: 871 | Disposition: A | Discharge status: Home / Self Care | Payer: Medicare Other | Attending: Family Medicine | Admitting: Family Medicine

## 2019-07-06 ENCOUNTER — Encounter (HOSPITAL_COMMUNITY): Payer: Self-pay

## 2019-07-06 ENCOUNTER — Observation Stay (HOSPITAL_COMMUNITY): Payer: Medicare Other

## 2019-07-06 ENCOUNTER — Other Ambulatory Visit: Payer: Self-pay

## 2019-07-06 DIAGNOSIS — R296 Repeated falls: Secondary | ICD-10-CM | POA: Diagnosis present

## 2019-07-06 DIAGNOSIS — W1839XA Other fall on same level, initial encounter: Secondary | ICD-10-CM | POA: Diagnosis present

## 2019-07-06 DIAGNOSIS — R627 Adult failure to thrive: Secondary | ICD-10-CM | POA: Diagnosis present

## 2019-07-06 DIAGNOSIS — Z818 Family history of other mental and behavioral disorders: Secondary | ICD-10-CM

## 2019-07-06 DIAGNOSIS — E785 Hyperlipidemia, unspecified: Secondary | ICD-10-CM | POA: Diagnosis present

## 2019-07-06 DIAGNOSIS — E1165 Type 2 diabetes mellitus with hyperglycemia: Secondary | ICD-10-CM | POA: Diagnosis not present

## 2019-07-06 DIAGNOSIS — R6520 Severe sepsis without septic shock: Secondary | ICD-10-CM | POA: Diagnosis not present

## 2019-07-06 DIAGNOSIS — Z923 Personal history of irradiation: Secondary | ICD-10-CM

## 2019-07-06 DIAGNOSIS — Z91048 Other nonmedicinal substance allergy status: Secondary | ICD-10-CM

## 2019-07-06 DIAGNOSIS — I10 Essential (primary) hypertension: Secondary | ICD-10-CM | POA: Diagnosis present

## 2019-07-06 DIAGNOSIS — R4182 Altered mental status, unspecified: Secondary | ICD-10-CM | POA: Diagnosis present

## 2019-07-06 DIAGNOSIS — H9193 Unspecified hearing loss, bilateral: Secondary | ICD-10-CM

## 2019-07-06 DIAGNOSIS — I4581 Long QT syndrome: Secondary | ICD-10-CM | POA: Diagnosis present

## 2019-07-06 DIAGNOSIS — D696 Thrombocytopenia, unspecified: Secondary | ICD-10-CM | POA: Diagnosis present

## 2019-07-06 DIAGNOSIS — K529 Noninfective gastroenteritis and colitis, unspecified: Secondary | ICD-10-CM | POA: Diagnosis present

## 2019-07-06 DIAGNOSIS — M205X2 Other deformities of toe(s) (acquired), left foot: Secondary | ICD-10-CM | POA: Diagnosis present

## 2019-07-06 DIAGNOSIS — D61818 Other pancytopenia: Secondary | ICD-10-CM | POA: Diagnosis not present

## 2019-07-06 DIAGNOSIS — F339 Major depressive disorder, recurrent, unspecified: Secondary | ICD-10-CM | POA: Diagnosis present

## 2019-07-06 DIAGNOSIS — A419 Sepsis, unspecified organism: Secondary | ICD-10-CM | POA: Diagnosis not present

## 2019-07-06 DIAGNOSIS — I2510 Atherosclerotic heart disease of native coronary artery without angina pectoris: Secondary | ICD-10-CM | POA: Diagnosis not present

## 2019-07-06 DIAGNOSIS — L309 Dermatitis, unspecified: Secondary | ICD-10-CM | POA: Diagnosis not present

## 2019-07-06 DIAGNOSIS — G9341 Metabolic encephalopathy: Secondary | ICD-10-CM | POA: Diagnosis present

## 2019-07-06 DIAGNOSIS — Z951 Presence of aortocoronary bypass graft: Secondary | ICD-10-CM

## 2019-07-06 DIAGNOSIS — Z20822 Contact with and (suspected) exposure to covid-19: Secondary | ICD-10-CM | POA: Diagnosis present

## 2019-07-06 DIAGNOSIS — E876 Hypokalemia: Secondary | ICD-10-CM | POA: Diagnosis not present

## 2019-07-06 DIAGNOSIS — E860 Dehydration: Secondary | ICD-10-CM | POA: Diagnosis present

## 2019-07-06 DIAGNOSIS — D649 Anemia, unspecified: Secondary | ICD-10-CM | POA: Diagnosis not present

## 2019-07-06 DIAGNOSIS — N179 Acute kidney failure, unspecified: Secondary | ICD-10-CM | POA: Diagnosis present

## 2019-07-06 DIAGNOSIS — R509 Fever, unspecified: Secondary | ICD-10-CM | POA: Diagnosis present

## 2019-07-06 DIAGNOSIS — Z8546 Personal history of malignant neoplasm of prostate: Secondary | ICD-10-CM

## 2019-07-06 DIAGNOSIS — Z85820 Personal history of malignant melanoma of skin: Secondary | ICD-10-CM

## 2019-07-06 DIAGNOSIS — E039 Hypothyroidism, unspecified: Secondary | ICD-10-CM | POA: Diagnosis present

## 2019-07-06 DIAGNOSIS — Z87891 Personal history of nicotine dependence: Secondary | ICD-10-CM

## 2019-07-06 DIAGNOSIS — F411 Generalized anxiety disorder: Secondary | ICD-10-CM | POA: Diagnosis present

## 2019-07-06 DIAGNOSIS — E1151 Type 2 diabetes mellitus with diabetic peripheral angiopathy without gangrene: Secondary | ICD-10-CM | POA: Diagnosis present

## 2019-07-06 DIAGNOSIS — Z8701 Personal history of pneumonia (recurrent): Secondary | ICD-10-CM

## 2019-07-06 DIAGNOSIS — J9811 Atelectasis: Secondary | ICD-10-CM | POA: Diagnosis present

## 2019-07-06 DIAGNOSIS — Z79890 Hormone replacement therapy (postmenopausal): Secondary | ICD-10-CM

## 2019-07-06 DIAGNOSIS — R911 Solitary pulmonary nodule: Secondary | ICD-10-CM | POA: Diagnosis not present

## 2019-07-06 DIAGNOSIS — Z9181 History of falling: Secondary | ICD-10-CM

## 2019-07-06 DIAGNOSIS — Z7984 Long term (current) use of oral hypoglycemic drugs: Secondary | ICD-10-CM

## 2019-07-06 DIAGNOSIS — L608 Other nail disorders: Secondary | ICD-10-CM | POA: Diagnosis present

## 2019-07-06 DIAGNOSIS — Z8719 Personal history of other diseases of the digestive system: Secondary | ICD-10-CM

## 2019-07-06 DIAGNOSIS — Z8249 Family history of ischemic heart disease and other diseases of the circulatory system: Secondary | ICD-10-CM

## 2019-07-06 DIAGNOSIS — G2581 Restless legs syndrome: Secondary | ICD-10-CM | POA: Diagnosis not present

## 2019-07-06 DIAGNOSIS — Z8042 Family history of malignant neoplasm of prostate: Secondary | ICD-10-CM

## 2019-07-06 DIAGNOSIS — R112 Nausea with vomiting, unspecified: Secondary | ICD-10-CM | POA: Diagnosis present

## 2019-07-06 DIAGNOSIS — Z7982 Long term (current) use of aspirin: Secondary | ICD-10-CM

## 2019-07-06 DIAGNOSIS — M869 Osteomyelitis, unspecified: Secondary | ICD-10-CM | POA: Diagnosis present

## 2019-07-06 DIAGNOSIS — Z806 Family history of leukemia: Secondary | ICD-10-CM

## 2019-07-06 DIAGNOSIS — I6782 Cerebral ischemia: Secondary | ICD-10-CM | POA: Diagnosis present

## 2019-07-06 DIAGNOSIS — R42 Dizziness and giddiness: Secondary | ICD-10-CM | POA: Diagnosis present

## 2019-07-06 DIAGNOSIS — Z9049 Acquired absence of other specified parts of digestive tract: Secondary | ICD-10-CM

## 2019-07-06 DIAGNOSIS — S20222A Contusion of left back wall of thorax, initial encounter: Secondary | ICD-10-CM | POA: Diagnosis present

## 2019-07-06 DIAGNOSIS — H9190 Unspecified hearing loss, unspecified ear: Secondary | ICD-10-CM | POA: Diagnosis present

## 2019-07-06 DIAGNOSIS — Z91012 Allergy to eggs: Secondary | ICD-10-CM

## 2019-07-06 DIAGNOSIS — Z79899 Other long term (current) drug therapy: Secondary | ICD-10-CM

## 2019-07-06 LAB — LIPASE, BLOOD: Lipase: 25 U/L (ref 11–51)

## 2019-07-06 LAB — CBC WITH DIFFERENTIAL/PLATELET
Abs Immature Granulocytes: 0.03 10*3/uL (ref 0.00–0.07)
Basophils Absolute: 0 10*3/uL (ref 0.0–0.1)
Basophils Relative: 0 %
Eosinophils Absolute: 0.1 10*3/uL (ref 0.0–0.5)
Eosinophils Relative: 1 %
HCT: 40.1 % (ref 39.0–52.0)
Hemoglobin: 12.9 g/dL — ABNORMAL LOW (ref 13.0–17.0)
Immature Granulocytes: 1 %
Lymphocytes Relative: 9 %
Lymphs Abs: 0.5 10*3/uL — ABNORMAL LOW (ref 0.7–4.0)
MCH: 29.4 pg (ref 26.0–34.0)
MCHC: 32.2 g/dL (ref 30.0–36.0)
MCV: 91.3 fL (ref 80.0–100.0)
Monocytes Absolute: 0.6 10*3/uL (ref 0.1–1.0)
Monocytes Relative: 10 %
Neutro Abs: 4.4 10*3/uL (ref 1.7–7.7)
Neutrophils Relative %: 79 %
Platelets: 95 10*3/uL — ABNORMAL LOW (ref 150–400)
RBC: 4.39 MIL/uL (ref 4.22–5.81)
RDW: 13.8 % (ref 11.5–15.5)
WBC: 5.5 10*3/uL (ref 4.0–10.5)
nRBC: 0 % (ref 0.0–0.2)

## 2019-07-06 LAB — BASIC METABOLIC PANEL
Anion gap: 15 (ref 5–15)
BUN: 17 mg/dL (ref 8–23)
CO2: 22 mmol/L (ref 22–32)
Calcium: 8.3 mg/dL — ABNORMAL LOW (ref 8.9–10.3)
Chloride: 100 mmol/L (ref 98–111)
Creatinine, Ser: 1.42 mg/dL — ABNORMAL HIGH (ref 0.61–1.24)
GFR calc Af Amer: 58 mL/min — ABNORMAL LOW (ref >60)
GFR calc non Af Amer: 50 mL/min — ABNORMAL LOW (ref >60)
Glucose, Bld: 312 mg/dL — ABNORMAL HIGH (ref 70–99)
Potassium: 3.9 mmol/L (ref 3.5–5.1)
Sodium: 137 mmol/L (ref 135–145)

## 2019-07-06 LAB — LACTIC ACID, PLASMA
Lactic Acid, Venous: 4.7 mmol/L (ref 0.5–1.9)
Lactic Acid, Venous: 3.2 mmol/L (ref 0.5–1.9)
Lactic Acid, Venous: 2.6 mmol/L (ref 0.5–1.9)

## 2019-07-06 LAB — HEPATIC FUNCTION PANEL
ALT: 36 U/L (ref 0–44)
AST: 60 U/L — ABNORMAL HIGH (ref 15–41)
Albumin: 3.5 g/dL (ref 3.5–5.0)
Alkaline Phosphatase: 102 U/L (ref 38–126)
Bilirubin, Direct: 0.2 mg/dL (ref 0.0–0.2)
Indirect Bilirubin: 0.6 mg/dL (ref 0.3–0.9)
Total Bilirubin: 0.8 mg/dL (ref 0.3–1.2)
Total Protein: 6.7 g/dL (ref 6.5–8.1)

## 2019-07-06 LAB — I-STAT CHEM 8, ED
BUN: 19 mg/dL (ref 8–23)
Calcium, Ion: 0.99 mmol/L — ABNORMAL LOW (ref 1.15–1.40)
Chloride: 100 mmol/L (ref 98–111)
Creatinine, Ser: 1.2 mg/dL (ref 0.61–1.24)
Glucose, Bld: 270 mg/dL — ABNORMAL HIGH (ref 70–99)
HCT: 40 % (ref 39.0–52.0)
Hemoglobin: 13.6 g/dL (ref 13.0–17.0)
Potassium: 4.1 mmol/L (ref 3.5–5.1)
Sodium: 136 mmol/L (ref 135–145)
TCO2: 23 mmol/L (ref 22–32)

## 2019-07-06 LAB — URINALYSIS, ROUTINE W REFLEX MICROSCOPIC
Bacteria, UA: NONE SEEN
Bilirubin Urine: NEGATIVE
Glucose, UA: 50 mg/dL — AB
Hgb urine dipstick: NEGATIVE
Ketones, ur: 5 mg/dL — AB
Leukocytes,Ua: NEGATIVE
Nitrite: NEGATIVE
Protein, ur: 30 mg/dL — AB
Specific Gravity, Urine: 1.027 (ref 1.005–1.030)
pH: 5 (ref 5.0–8.0)

## 2019-07-06 LAB — SARS CORONAVIRUS 2 (TAT 6-24 HRS): SARS Coronavirus 2: NEGATIVE

## 2019-07-06 LAB — APTT: aPTT: 28 seconds (ref 24–36)

## 2019-07-06 LAB — PROTIME-INR
INR: 1.2 (ref 0.8–1.2)
Prothrombin Time: 14.8 seconds (ref 11.4–15.2)

## 2019-07-06 LAB — HEMOGLOBIN A1C
Hgb A1c MFr Bld: 7.2 % — ABNORMAL HIGH (ref 4.8–5.6)
Mean Plasma Glucose: 159.94 mg/dL

## 2019-07-06 LAB — ETHANOL: Alcohol, Ethyl (B): <10 mg/dL (ref <10)

## 2019-07-06 LAB — T4, FREE: Free T4: 0.73 ng/dL (ref 0.61–1.12)

## 2019-07-06 LAB — GLUCOSE, CAPILLARY
Glucose-Capillary: 137 mg/dL — ABNORMAL HIGH (ref 70–99)
Glucose-Capillary: 112 mg/dL — ABNORMAL HIGH (ref 70–99)

## 2019-07-06 LAB — CBG MONITORING, ED
Glucose-Capillary: 243 mg/dL — ABNORMAL HIGH (ref 70–99)
Glucose-Capillary: 136 mg/dL — ABNORMAL HIGH (ref 70–99)

## 2019-07-06 LAB — HIV ANTIBODY (ROUTINE TESTING W REFLEX): HIV Screen 4th Generation wRfx: NONREACTIVE

## 2019-07-06 LAB — AMMONIA: Ammonia: 39 umol/L — ABNORMAL HIGH (ref 9–35)

## 2019-07-06 LAB — TROPONIN I (HIGH SENSITIVITY)
Troponin I (High Sensitivity): 3 ng/L (ref <18)
Troponin I (High Sensitivity): 5 ng/L (ref <18)

## 2019-07-06 LAB — TSH: TSH: 6.509 u[IU]/mL — ABNORMAL HIGH (ref 0.350–4.500)

## 2019-07-06 MED ORDER — LEVOTHYROXINE SODIUM 25 MCG PO TABS
125.0000 ug | ORAL_TABLET | Freq: Every day | ORAL | Status: DC
  Administered 2019-07-07 – 2019-07-08 (×2): 125 ug via ORAL
  Filled 2019-07-06 (×2): qty 1

## 2019-07-06 MED ORDER — SODIUM CHLORIDE 0.9 % IV BOLUS
1000.0000 mL | Freq: Once | INTRAVENOUS | Status: AC
  Administered 2019-07-06: 07:00:00 1000 mL via INTRAVENOUS

## 2019-07-06 MED ORDER — IOHEXOL 300 MG/ML  SOLN
100.0000 mL | Freq: Once | INTRAMUSCULAR | Status: AC | PRN
  Administered 2019-07-06: 09:00:00 100 mL via INTRAVENOUS

## 2019-07-06 MED ORDER — VANCOMYCIN HCL 1500 MG/300ML IV SOLN
1500.0000 mg | INTRAVENOUS | Status: DC
  Filled 2019-07-06 (×2): qty 300

## 2019-07-06 MED ORDER — SODIUM CHLORIDE 0.9 % IV BOLUS
500.0000 mL | Freq: Once | INTRAVENOUS | Status: AC
  Administered 2019-07-06: 10:00:00 500 mL via INTRAVENOUS

## 2019-07-06 MED ORDER — ONDANSETRON HCL 4 MG PO TABS: 4.0000 mg | ORAL_TABLET | Freq: Four times a day (QID) | ORAL | Status: DC | PRN

## 2019-07-06 MED ORDER — SODIUM CHLORIDE 0.9 % IV SOLN
2.0000 g | Freq: Three times a day (TID) | INTRAVENOUS | Status: DC
  Administered 2019-07-06 – 2019-07-08 (×6): 2 g via INTRAVENOUS
  Filled 2019-07-06 (×7): qty 2

## 2019-07-06 MED ORDER — DIAZEPAM 2 MG PO TABS: 2.0000 mg | ORAL_TABLET | Freq: Four times a day (QID) | ORAL | Status: DC | PRN

## 2019-07-06 MED ORDER — METRONIDAZOLE IN NACL 5-0.79 MG/ML-% IV SOLN
500.0000 mg | Freq: Once | INTRAVENOUS | Status: AC
  Administered 2019-07-06: 10:00:00 500 mg via INTRAVENOUS
  Filled 2019-07-06: qty 100

## 2019-07-06 MED ORDER — TRAZODONE HCL 50 MG PO TABS
50.0000 mg | ORAL_TABLET | Freq: Once | ORAL | Status: AC
  Administered 2019-07-06: 50 mg via ORAL
  Filled 2019-07-06: qty 1

## 2019-07-06 MED ORDER — SODIUM CHLORIDE 0.9 % IV SOLN: INTRAVENOUS | Status: DC

## 2019-07-06 MED ORDER — GABAPENTIN 100 MG PO CAPS: 100.0000 mg | ORAL_CAPSULE | Freq: Three times a day (TID) | ORAL | Status: DC

## 2019-07-06 MED ORDER — ONDANSETRON HCL 4 MG/2ML IJ SOLN: 4.0000 mg | Freq: Four times a day (QID) | INTRAMUSCULAR | Status: DC | PRN

## 2019-07-06 MED ORDER — SODIUM CHLORIDE 0.9 % IV BOLUS
1000.0000 mL | Freq: Once | INTRAVENOUS | Status: AC
  Administered 2019-07-06: 1000 mL via INTRAVENOUS

## 2019-07-06 MED ORDER — SODIUM CHLORIDE 0.9 % IV SOLN
2.0000 g | Freq: Once | INTRAVENOUS | Status: AC
  Administered 2019-07-06: 09:00:00 2 g via INTRAVENOUS
  Filled 2019-07-06: qty 2

## 2019-07-06 MED ORDER — HEPARIN SODIUM (PORCINE) 5000 UNIT/ML IJ SOLN
5000.0000 [IU] | Freq: Three times a day (TID) | INTRAMUSCULAR | Status: DC
  Filled 2019-07-06 (×5): qty 1

## 2019-07-06 MED ORDER — INSULIN ASPART 100 UNIT/ML ~~LOC~~ SOLN: 0.0000 [IU] | Freq: Every day | SUBCUTANEOUS | Status: DC

## 2019-07-06 MED ORDER — CALCIUM GLUCONATE-NACL 1-0.675 GM/50ML-% IV SOLN
1.0000 g | Freq: Once | INTRAVENOUS | Status: AC
  Administered 2019-07-06: 11:00:00 1000 mg via INTRAVENOUS
  Filled 2019-07-06: qty 50

## 2019-07-06 MED ORDER — SODIUM CHLORIDE 0.9 % IV BOLUS (SEPSIS)
1000.0000 mL | Freq: Once | INTRAVENOUS | Status: AC
  Administered 2019-07-06: 11:00:00 1000 mL via INTRAVENOUS

## 2019-07-06 MED ORDER — VANCOMYCIN HCL IN DEXTROSE 1-5 GM/200ML-% IV SOLN: 1000.0000 mg | Freq: Once | INTRAVENOUS | Status: DC

## 2019-07-06 MED ORDER — INSULIN ASPART 100 UNIT/ML ~~LOC~~ SOLN
0.0000 [IU] | Freq: Three times a day (TID) | SUBCUTANEOUS | Status: DC
  Administered 2019-07-06 (×2): 1 [IU] via SUBCUTANEOUS
  Administered 2019-07-07: 2 [IU] via SUBCUTANEOUS
  Administered 2019-07-08: 1 [IU] via SUBCUTANEOUS

## 2019-07-06 MED ORDER — VANCOMYCIN HCL 1750 MG/350ML IV SOLN
1750.0000 mg | Freq: Once | INTRAVENOUS | Status: AC
  Administered 2019-07-06: 10:00:00 1750 mg via INTRAVENOUS
  Filled 2019-07-06: qty 350

## 2019-07-06 NOTE — ED Provider Notes (Signed)
Medical screening examination/treatment/procedure(s) were conducted as a shared visit with non-physician practitioner(s) and myself.  I personally evaluated the patient during the encounter. Briefly, the patient is a 69 y.o. male with history of hypertension, CAD, prostate cancer who presents the ED with altered mental status, dizziness, falls.  Patient with overall unremarkable vitals except for mild tachycardia.  Patient recently treated for 2 pneumonias but denies ever having Covid.  Patient is not vaccinated.  Has had multiple falls over the last several months.  Recently fell this past week and has large bruise to his left flank.  Has not been evaluated for this.  However, he did see his primary care doctor who treated him for pneumonia as.  Recently increased his thyroid medications.  Does not have any current chest pain or shortness of breath.  He is diaphoretic on exam but otherwise appears neurologically intact.  Today he had some nausea and vomiting that started earlier in the night.  Has had some headaches on and off for the last several months as well.  Denies any abdominal pain.  Maybe has had some pain with urination.  Cough has improved.  No sputum production.  Overall patient does not appear very well has had some failure to thrive here over the last several weeks.  Has large left flank bruise.  Will obtain infectious work-up and CT of head, neck, abdomen, pelvis, chest to evaluate for traumatic/infectious findings as well.  Does not appear to have any signs to suggest acute stroke but given that symptoms have been ongoing CT scan will likely help evaluate for this as well.  May consider MRI.  Patient with overall unremarkable CT imaging except for possibly enteritis.  Lactic acid was 4.7.  Empiric antibiotics have been given.  Urinalysis negative for infection.  Patient to be admitted for further sepsis care.  Hemodynamically stable otherwise.  This chart was dictated using voice recognition  software.  Despite best efforts to proofread,  errors can occur which can change the documentation meaning.     EKG Interpretation  Date/Time:  Sunday July 06 2019 07:08:29 EST Ventricular Rate:  112 PR Interval:    QRS Duration: 110 QT Interval:  373 QTC Calculation: 510 R Axis:   46 Text Interpretation: Sinus tachycardia Borderline repolarization abnormality Prolonged QT interval Baseline wander in lead(s) V2 No significant change since last tracing Confirmed by Lennice Sites [phone]) on 07/06/2019 7:34:03 AM           Lennice Sites, DO 07/06/19 [phone]

## 2019-07-06 NOTE — H&P (Signed)
History and Physical    Dean Cole [id number] DOB: 1951-03-26 DOA: 07/06/2019  I have briefly reviewed the patient's prior medical records in [facility]  PCP: [name], MD  Patient coming from: home  Chief Complaint: Presents with multiple complaints: Dizziness when standing x2 months, wife reports altered mental status x1 month, falls  HPI: Dean Cole is a 69 y.o. male with medical history significant of skin cancer as well as prostate cancer, diabetes, hypertension and hypothyroidism.  Patient was seen in the ER on 06/13/2019 for the evaluation of chest pain.  Chest pain was deemed to be related to picking up a puppy and possibly a pneumonia.  He was treated with doxycycline.  He was brought to the ER today via EMS with complaints of dizziness when standing for the last 2 months.  Per ER report, wife reported altered mental status for the last 1 month.  Wife also reports that the patient has fallen 3 times in the last week.  Last night he developed multiple episodes of vomiting.  Today in the ER he had several episodes of vomiting as well as uncontrollable loose stools.  Patient states that he has not been eating well for the last few weeks.  Patient is alert and oriented but a poor historian.   Patient states that he has for adopted/foster children all of which have special needs. I called wife to gain more information regarding his baseline function, memory and issues she has noticed at home but no answer  In the ER patient was pan scanned for the reported trauma/falls and there was questionable enteritis found on the CT scan in the abdomen pelvis as well as an incidental pulmonary nodule.  Other labs were remarkable for a low calcium as well as elevated glucose, creatinine. ER PA did note that patient was ambulating in the hallways of the ER without difficulty, wife reported that he got lost going to the bathroom.   Review of Systems: As per HPI otherwise 10 point  review of systems negative.   Past Medical History:  Diagnosis Date  . Anxiety   . Cancer ([place])    skin cancer  basel cell carcinoma  . Cholecystitis   . Controlled type 2 DM with peripheral circulatory disorder ([place]) 09/20/2014  . Coronary artery disease   . Depression   . Headache(784.0)   . HTN (hypertension)   . Hx of skin cancer, basal cell   . Hyperlipidemia   . Hypothyroidism   . PAD (peripheral artery disease) ([place]) 09/20/2014  . Prostate cancer ([place])   . Restless leg syndrome   . Skin cancer    melanoma    Past Surgical History:  Procedure Laterality Date  . BASAL CELL CARCINOMA EXCISION  2000   Dr,Arkin   . CARDIAC CATHETERIZATION  07/07/2011  . CHOLECYSTECTOMY  09/14/2011   Procedure: LAPAROSCOPIC CHOLECYSTECTOMY WITH INTRAOPERATIVE CHOLANGIOGRAM;  Surgeon: Gwenyth Ober, MD;  Location: [facility];  Service: General;  Laterality: N/A;  . CORONARY ARTERY BYPASS GRAFT  07/10/2011   Procedure: CORONARY ARTERY BYPASS GRAFTING (CABG);  Surgeon: Ivin Poot, MD;  Location: [facility];  Service: Open Heart Surgery;  Laterality: N/A;  . EXCISIONAL HEMORRHOIDECTOMY  1997  . HEMORROIDECTOMY  2000  . LEFT HEART CATHETERIZATION WITH CORONARY ANGIOGRAM N/A 07/07/2011   Procedure: LEFT HEART CATHETERIZATION WITH CORONARY ANGIOGRAM;  Surgeon: Burnell Blanks, MD;  Location: [facility] CATH LAB;  Service: Cardiovascular;  Laterality: N/A;     reports  that he quit smoking about 37 years ago. His smoking use included cigarettes. He has a 21.00 pack-year smoking history. He has quit using smokeless tobacco. He reports that he does not drink alcohol or use drugs.  Allergies  Allergen Reactions  . Eggs Or Egg-Derived Products Other (See Comments)    Bloating, nausea, and no rash   . Adhesive [Tape] Swelling    Family History  Problem Relation Age of Onset  . Coronary artery disease Father   . Prostate cancer Father   . Leukemia Mother   . Suicidality Brother   . Coronary artery disease  Paternal Grandfather   . Breast cancer Neg Hx   . Colon cancer Neg Hx   . Pancreatic cancer Neg Hx     Prior to Admission medications   Medication Sig Start Date End Date Taking? Authorizing Provider  aspirin EC 81 MG tablet Take 1 tablet (81 mg total) by mouth daily. 12/16/18   Nahser, Wonda Cheng, MD  Calcium Carbonate-Vitamin D (CALCIUM-VITAMIN D3 PO) Take by mouth.    [provider]  clotrimazole-betamethasone (LOTRISONE) cream APPLY TO AFFECTED AREA TWICE A DAY 04/28/19   Trula Slade, DPM  diazepam (VALIUM) 5 MG tablet TAKE 1 TABLET BY MOUTH FOUR TIMES A DAY AS NEEDED FOR NERVES 02/21/17   Reed, Tiffany L, DO  doxycycline (VIBRAMYCIN) 100 MG capsule Take 1 capsule (100 mg total) by mouth 2 (two) times daily. One po bid x 7 days 06/13/19   Elnora Morrison, MD  gabapentin (NEURONTIN) 100 MG capsule Take 1 capsule (100 mg total) by mouth 3 (three) times daily. 02/21/19   Trula Slade, DPM  imipramine (TOFRANIL) 25 MG tablet TAKE 3 TABLETS EVERY MORNING AND 4 TABLETS EVERY NIGHT FOR ANXIETY AND DEPRESSION 12/23/18   [provider]  levothyroxine (SYNTHROID, LEVOTHROID) 125 MCG tablet  06/17/18   [provider]  metFORMIN (GLUCOPHAGE) 500 MG tablet Take 1 tablet (500 mg total) by mouth 2 (two) times daily with a meal. 08/12/15   Mast, Man X, NP  metoprolol succinate (TOPROL-XL) 25 MG 24 hr tablet TAKE 1 TABLET BY MOUTH EVERY DAY 07/17/18   [provider]  mometasone (ELOCON) 0.1 % cream  06/19/18   [provider]  mupirocin ointment (BACTROBAN) 2 % Apply 1 application topically 2 (two) times daily. 01/10/19   Trula Slade, DPM  MYRBETRIQ 25 MG TB24 tablet TAKE 1 TABLET BY MOUTH EVERY DAY 04/27/19   Bruning, Ashlyn, PA-C  NITROSTAT 0.4 MG SL tablet Place 0.4 mg under the tongue every 5 (five) minutes as needed. Reported on 10/26/2015 07/06/11   [provider]  ondansetron (ZOFRAN-ODT) 4 MG disintegrating tablet  05/21/18   [provider]  pentazocine-naloxone (TALWIN NX) 50-0.5 MG tablet One tablet daily as needed for headache 07/18/18   [provider]  phenazopyridine (PYRIDIUM) 200 MG tablet Take 1 tablet (200 mg total) by mouth 3 (three) times daily as needed for pain. 11/29/18   Tyler Pita, MD  polyethylene glycol [facility] / Floria Raveling) packet Take by mouth.    [provider]  simvastatin (ZOCOR) 20 MG tablet TAKE ONE TABLET BY MOUTH ONCE DAILY FOR CHOLESTEROL 10/16/16   Reed, Tiffany L, DO  tamsulosin (FLOMAX) 0.4 MG CAPS capsule Take 0.4 mg by mouth 2 (two) times daily.    [provider]    Physical Exam: Vitals:   07/06/19 0744 07/06/19 0930 07/06/19 0945 07/06/19 1000  BP:  (!) 141/58 (!) 99/58  105/64  Pulse:  (!) 109 (!) 106 (!) 105  Resp:   19 16  Temp: (!) 100.5 F (38.1 C)     TempSrc: Rectal     SpO2:  96% 94% 94%  Weight:    84.8 kg  Height:    5\' 10"  (1.778 m)      Constitutional: In bed, hard of hearing, multiple tattoos on face and upper body Eyes: PERRL, lids and conjunctivae normal ENMT: Mucous membranes are dry  Neck: normal, supple, no masses, no thyromegaly Respiratory: No wheezing, no increased work of breathing, diminished Cardiovascular: Sinus tachycardia, regular, no lower extremity edema Abdomen: no tenderness, no masses palpated. Bowel sounds positive.  Musculoskeletal: no clubbing / cyanosis. Normal muscle tone.  Skin: Left toenail has been removed and has a large bloody scab over, right toenail removed and has healed well Psychiatric: Answers questions appropriately but is hard of hearing  Labs on Admission: I have personally reviewed following labs and imaging studies  CBC: Recent Labs  Lab 07/06/19 0630 07/06/19 0727  WBC 5.5  --   NEUTROABS 4.4  --   HGB 12.9* 13.6  HCT 40.1 40.0  MCV 91.3  --   PLT 95*  --    Basic Metabolic Panel: Recent Labs  Lab 07/06/19 0630 07/06/19 0727  NA 137 136  K 3.9 4.1  CL 100 100  CO2  22  --   GLUCOSE 312* 270*  BUN 17 19  CREATININE 1.42* 1.20  CALCIUM 8.3*  --    GFR: Estimated Creatinine Clearance: 60.8 mL/min (by C-G formula based on SCr of 1.2 mg/dL). Liver Function Tests: Recent Labs  Lab 07/06/19 0630  AST 60*  ALT 36  ALKPHOS 102  BILITOT 0.8  PROT 6.7  ALBUMIN 3.5   Recent Labs  Lab 07/06/19 0630  LIPASE 25   Recent Labs  Lab 07/06/19 0630  AMMONIA 39*   Coagulation Profile: Recent Labs  Lab 07/06/19 0630  INR 1.2   Cardiac Enzymes: No results for input(s): CKTOTAL, CKMB, CKMBINDEX, TROPONINI in the last 168 hours. BNP (last 3 results) No results for input(s): PROBNP in the last 8760 hours. HbA1C: No results for input(s): HGBA1C in the last 72 hours. CBG: Recent Labs  Lab 07/06/19 0726  GLUCAP 243*   Lipid Profile: No results for input(s): CHOL, HDL, LDLCALC, TRIG, CHOLHDL, LDLDIRECT in the last 72 hours. Thyroid Function Tests: Recent Labs    07/06/19 0748  TSH 6.509*  FREET4 0.73   Anemia Panel: No results for input(s): VITAMINB12, FOLATE, FERRITIN, TIBC, IRON, RETICCTPCT in the last 72 hours. Urine analysis:    Component Value Date/Time   COLORURINE AMBER (A) 07/06/2019 0647   APPEARANCEUR HAZY (A) 07/06/2019 0647   APPEARANCEUR Clear 10/26/2015 1638   LABSPEC 1.027 07/06/2019 0647   PHURINE 5.0 07/06/2019 0647   GLUCOSEU 50 (A) 07/06/2019 0647   HGBUR NEGATIVE 07/06/2019 0647   BILIRUBINUR NEGATIVE 07/06/2019 0647   BILIRUBINUR Negative 10/26/2015 1638   KETONESUR 5 (A) 07/06/2019 0647   PROTEINUR 30 (A) 07/06/2019 0647   UROBILINOGEN 1.0 09/11/2011 2335   NITRITE NEGATIVE 07/06/2019 0647   LEUKOCYTESUR NEGATIVE 07/06/2019 0647     Radiological Exams on Admission: CT Head Wo Contrast  Result Date: 07/06/2019 CLINICAL DATA:  Altered mental status. EXAM: CT HEAD WITHOUT CONTRAST TECHNIQUE: Contiguous axial images were obtained from the base of the skull through the vertex without intravenous contrast.  COMPARISON:  None. FINDINGS: Brain: Atrophy with sulcal prominence and centralized  atrophy with commensurate ex vacuo dilatation of the ventricular system. Scattered periventricular hypodensities compatible with microvascular ischemic disease. No CT evidence of acute large territory infarct. No intraparenchymal or extra-axial mass or hemorrhage. Normal configuration of the ventricles and the basilar cisterns. No midline shift. Vascular: Intracranial atherosclerosis. Skull: No displaced calvarial fracture. Sinuses/Orbits: There is underpneumatization the bilateral frontal sinuses. The remaining paranasal sinuses and mastoid air cells is normal. No air-fluid levels. Other: Regional soft tissues appear normal. IMPRESSION: Mild microvascular ischemic disease and advanced atrophy without superimposed acute intracranial process. Electronically Signed   By: Sandi Mariscal M.D.   On: 07/06/2019 08:33   CT Chest W Contrast  Result Date: 07/06/2019 CLINICAL DATA:  Multiple falls over the past several months. History of prostate cancer. EXAM: CT CHEST, ABDOMEN, AND PELVIS WITH CONTRAST TECHNIQUE: Multidetector CT imaging of the chest, abdomen and pelvis was performed following the standard protocol during bolus administration of intravenous contrast. CONTRAST:  180mL OMNIPAQUE IOHEXOL 300 MG/ML  SOLN COMPARISON:  CT abdomen pelvis-06/26/2018; 09/11/2011 FINDINGS: CT CHEST FINDINGS Cardiovascular: Normal heart size. Post median sternotomy and CABG with calcifications within the native coronary arteries. No pericardial effusion. Normal caliber of the thoracic aorta. No evidence of thoracic aortic dissection or periaortic stranding on this nongated examination. The left vertebral artery is incidentally noted to arise directly from the aortic arch. The branch vessels of the aortic arch appear patent throughout their imaged courses. Although this examination was not tailored for the evaluation the pulmonary arteries, there are no  discrete filling defects within the central pulmonary arterial tree to suggest central pulmonary embolism. Normal caliber the main pulmonary artery. Mediastinum/Nodes: No bulky mediastinal, hilar or axillary lymphadenopathy. Lungs/Pleura: There is a punctate (approximate 0.5 cm) subpleural nodule within right upper lobe (image 46, series 7 as well as a smaller (approximately 0.3 cm) subpleural nodule within the left upper lobe (image 59, series 7). Subsegmental atelectasis within the bilateral lower lobes, left greater than right as well as about the anterior aspect of the right minor fissure and within the lingula. No discrete focal airspace opacities. No pleural effusion or pneumothorax. The central pulmonary airways appear widely patent. Musculoskeletal: No acute or aggressive osseous abnormalities. Mild symmetric gynecomastia. Regional soft tissues appear otherwise normal. The thyroid gland appears atrophic. _________________________________________________________ CT ABDOMEN PELVIS FINDINGS Hepatobiliary: There is diffuse decreased attenuation of the hepatic parenchyma on this postcontrast examination suggestive of hepatic steatosis. Punctate (subcentimeter) hypoattenuating lesions within the right lobe of the liver (image 62, series 6) as well as the caudate (image 62, series 6), are too small to adequately characterize though unchanged compared to the 06/2018 examination and favored to represent hepatic cysts. Post cholecystectomy. No intra or extrahepatic biliary ductal dilatation. No ascites. Pancreas: Normal appearance of the pancreas. Spleen: Normal appearance of the spleen. Adrenals/Urinary Tract: There is symmetric enhancement and excretion of the bilateral kidneys. Solitary punctate (2 mm) nonobstructing renal stones are seen bilaterally (right coronal image 104, series 8; left coronal image 87, series 8). No discrete renal lesions. There is a minimal amount of grossly symmetric bilateral perinephric  stranding. No urine obstruction. There is mild thickening of the left adrenal gland without discrete nodule. Normal appearance of the right adrenal gland. There is a approximately 0.6 cm stone lying dependently within the urinary bladder (image 116, series 6, similar to the 06/26/2018 examination. Mild thickening of the urinary bladder wall. Stomach/Bowel: There is circumferential wall thickening involving the sigmoid colon to the level of the rectum (representative images 102  and 107, series 6), with associated minimal amount of fluid in the pelvic cul-de-sac but without definable/drainable fluid collection (sagittal image 99, series 9). This finding is associated with mild patulous distension of the upstream colon, not definitely resulting in enteric obstruction. Circumferential wall thickening involving several loops of mid jejunum (representative image 52 and 63, series 6), similar to the 06/2018 examination though new compared to the 08/2011 examination. Normal appearance of the terminal ileum and the retrocecal appendix. No pneumoperitoneum, pneumatosis or portal venous gas. Vascular/Lymphatic: There is a moderate to large amount of eccentric mixed calcified and noncalcified atherosclerotic plaque throughout the normal caliber abdominal aorta. Moderate to large amount calcified atherosclerotic plaque involving the right common iliac artery approaches 50% luminal narrowing (image 93, series 6, similar to the 2013 examination. No bulky mediastinal, hilar or axillary lymphadenopathy. 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 Other: There is a minimal amount of ill-defined subcutaneous edema about the left flank. Regional soft tissues appear otherwise normal. Musculoskeletal: No acute or aggressive osseous abnormalities within the abdomen or pelvis. Mild multilevel lumbar spine DDD, worse at L3-L4 and L4-L5 with disc  space height loss, endplate irregularity and sclerosis. IMPRESSION: Chest CT impression: 1. No acute cardiopulmonary disease. 2. Post median sternotomy and CABG. Calcifications within native coronary arteries. Aortic Atherosclerosis (ICD10-I70.0). 3. Bilateral punctate pulmonary nodules, largest of which within the right upper lobe measures 0.5 cm in diameter. No follow-up needed if patient is low-risk (and has no known or suspected primary neoplasm). Non-contrast chest CT can be considered in 12 months if patient is high-risk. This recommendation follows the consensus statement: Guidelines for Management of Incidental Pulmonary Nodules Detected on CT Images: From the Fleischner Society 2017; Radiology 2017; 284:228-243. Abdomen and pelvic CT impression: 1. Apparent circumferential wall thickening involving the sigmoid colon extending with patulous distension of the upstream colon, though not resulting in enteric obstruction. Findings are nonspecific though could be seen in the setting of an enteritis. Further evaluation with colonoscopy could be performed as indicated. 2. Mild circumferential wall thickening involving several loops of mid small bowel, not resulting in enteric obstruction, similar to the 06/2018 examination while potentially secondary to peristalsis though additional location of an enteritis could have a similar appearance. 3. Redemonstrated 0.6 cm urinary bladder calculus, similar to the 06/2018 examination. 4. Redemonstrated solitary punctate (2 mm) nonobstructing bilateral renal stones. 5. Suspected hepatic steatosis.  Correlation with LFTs is advised. Electronically Signed   By: Sandi Mariscal M.D.   On: 07/06/2019 09:02   CT Cervical Spine Wo Contrast  Result Date: 07/06/2019 CLINICAL DATA:  Dizziness, multiple falls, history of prostate cancer EXAM: CT CERVICAL SPINE WITHOUT CONTRAST TECHNIQUE: Multidetector CT imaging of the cervical spine was performed without intravenous contrast.  Multiplanar CT image reconstructions were also generated. COMPARISON:  None. FINDINGS: Alignment: Anteroposterior alignment is maintained. Skull base and vertebrae: Vertebral body heights are preserved. There is no suspicious osseous lesion. No acute fracture. Soft tissues and spinal canal: No prevertebral fluid or swelling. No visible canal hematoma. Disc levels:  There is no significant degenerative stenosis. Upper chest: Negative. Other: None. IMPRESSION: No significant or acute osseous abnormality. Electronically Signed   By: Macy Mis M.D.   On: 07/06/2019 08:45   CT ABDOMEN PELVIS W CONTRAST  Result Date: 07/06/2019 CLINICAL DATA:  Multiple falls over the past several months. History of prostate cancer. EXAM: CT CHEST, ABDOMEN, AND PELVIS WITH CONTRAST TECHNIQUE:  Multidetector CT imaging of the chest, abdomen and pelvis was performed following the standard protocol during bolus administration of intravenous contrast. CONTRAST:  17mL OMNIPAQUE IOHEXOL 300 MG/ML  SOLN COMPARISON:  CT abdomen pelvis-06/26/2018; 09/11/2011 FINDINGS: CT CHEST FINDINGS Cardiovascular: Normal heart size. Post median sternotomy and CABG with calcifications within the native coronary arteries. No pericardial effusion. Normal caliber of the thoracic aorta. No evidence of thoracic aortic dissection or periaortic stranding on this nongated examination. The left vertebral artery is incidentally noted to arise directly from the aortic arch. The branch vessels of the aortic arch appear patent throughout their imaged courses. Although this examination was not tailored for the evaluation the pulmonary arteries, there are no discrete filling defects within the central pulmonary arterial tree to suggest central pulmonary embolism. Normal caliber the main pulmonary artery. Mediastinum/Nodes: No bulky mediastinal, hilar or axillary lymphadenopathy. Lungs/Pleura: There is a punctate (approximate 0.5 cm) subpleural nodule within right upper  lobe (image 46, series 7 as well as a smaller (approximately 0.3 cm) subpleural nodule within the left upper lobe (image 59, series 7). Subsegmental atelectasis within the bilateral lower lobes, left greater than right as well as about the anterior aspect of the right minor fissure and within the lingula. No discrete focal airspace opacities. No pleural effusion or pneumothorax. The central pulmonary airways appear widely patent. Musculoskeletal: No acute or aggressive osseous abnormalities. Mild symmetric gynecomastia. Regional soft tissues appear otherwise normal. The thyroid gland appears atrophic. _________________________________________________________ CT ABDOMEN PELVIS FINDINGS Hepatobiliary: There is diffuse decreased attenuation of the hepatic parenchyma on this postcontrast examination suggestive of hepatic steatosis. Punctate (subcentimeter) hypoattenuating lesions within the right lobe of the liver (image 62, series 6) as well as the caudate (image 62, series 6), are too small to adequately characterize though unchanged compared to the 06/2018 examination and favored to represent hepatic cysts. Post cholecystectomy. No intra or extrahepatic biliary ductal dilatation. No ascites. Pancreas: Normal appearance of the pancreas. Spleen: Normal appearance of the spleen. Adrenals/Urinary Tract: There is symmetric enhancement and excretion of the bilateral kidneys. Solitary punctate (2 mm) nonobstructing renal stones are seen bilaterally (right coronal image 104, series 8; left coronal image 87, series 8). No discrete renal lesions. There is a minimal amount of grossly symmetric bilateral perinephric stranding. No urine obstruction. There is mild thickening of the left adrenal gland without discrete nodule. Normal appearance of the right adrenal gland. There is a approximately 0.6 cm stone lying dependently within the urinary bladder (image 116, series 6, similar to the 06/26/2018 examination. Mild thickening of  the urinary bladder wall. Stomach/Bowel: There is circumferential wall thickening involving the sigmoid colon to the level of the rectum (representative images 102 and 107, series 6), with associated minimal amount of fluid in the pelvic cul-de-sac but without definable/drainable fluid collection (sagittal image 99, series 9). This finding is associated with mild patulous distension of the upstream colon, not definitely resulting in enteric obstruction. Circumferential wall thickening involving several loops of mid jejunum (representative image 52 and 63, series 6), similar to the 06/2018 examination though new compared to the 08/2011 examination. Normal appearance of the terminal ileum and the retrocecal appendix. No pneumoperitoneum, pneumatosis or portal venous gas. Vascular/Lymphatic: There is a moderate to large amount of eccentric mixed calcified and noncalcified atherosclerotic plaque throughout the normal caliber abdominal aorta. Moderate to large amount calcified atherosclerotic plaque involving the right common iliac artery approaches 50% luminal narrowing (image 93, series 6, similar to the 2013 examination. No bulky mediastinal, hilar or  axillary lymphadenopathy. 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 Other: There is a minimal amount of ill-defined subcutaneous edema about the left flank. Regional soft tissues appear otherwise normal. Musculoskeletal: No acute or aggressive osseous abnormalities within the abdomen or pelvis. Mild multilevel lumbar spine DDD, worse at L3-L4 and L4-L5 with disc space height loss, endplate irregularity and sclerosis. IMPRESSION: Chest CT impression: 1. No acute cardiopulmonary disease. 2. Post median sternotomy and CABG. Calcifications within native coronary arteries. Aortic Atherosclerosis (ICD10-I70.0). 3. Bilateral punctate pulmonary nodules, largest of which within the  right upper lobe measures 0.5 cm in diameter. No follow-up needed if patient is low-risk (and has no known or suspected primary neoplasm). Non-contrast chest CT can be considered in 12 months if patient is high-risk. This recommendation follows the consensus statement: Guidelines for Management of Incidental Pulmonary Nodules Detected on CT Images: From the Fleischner Society 2017; Radiology 2017; 284:228-243. Abdomen and pelvic CT impression: 1. Apparent circumferential wall thickening involving the sigmoid colon extending with patulous distension of the upstream colon, though not resulting in enteric obstruction. Findings are nonspecific though could be seen in the setting of an enteritis. Further evaluation with colonoscopy could be performed as indicated. 2. Mild circumferential wall thickening involving several loops of mid small bowel, not resulting in enteric obstruction, similar to the 06/2018 examination while potentially secondary to peristalsis though additional location of an enteritis could have a similar appearance. 3. Redemonstrated 0.6 cm urinary bladder calculus, similar to the 06/2018 examination. 4. Redemonstrated solitary punctate (2 mm) nonobstructing bilateral renal stones. 5. Suspected hepatic steatosis.  Correlation with LFTs is advised. Electronically Signed   By: Sandi Mariscal M.D.   On: 07/06/2019 09:02   DG Chest Portable 1 View  Result Date: 07/06/2019 CLINICAL DATA:  69 year old male with history of trauma from a fall. Evaluate for potential pneumonia. EXAM: PORTABLE CHEST 1 VIEW COMPARISON:  Chest x-ray 06/13/2019. FINDINGS: Lung volumes are low. Linear opacities in the left base may reflect areas of subsegmental atelectasis or scarring. No consolidative airspace disease. No pleural effusions. No pneumothorax. No pulmonary nodule or mass noted. Pulmonary vasculature and the cardiomediastinal silhouette are within normal limits. Status post median sternotomy for CABG. IMPRESSION: 1. Low  lung volumes with probable subsegmental atelectasis in the left base. 2. Postoperative changes, as above. Electronically Signed   By: Vinnie Langton M.D.   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)   Hearing loss   Acquired deformity of toenail   N&V (nausea and vomiting)   Lung nodule    Sepsis due to unknown source -Patient was reportedly febrile, hypotensive, tachycardic, tachypneic, with an elevated lactic acid but unclear source -Denies symptoms of UTI, cough or any symptoms corresponding to a meningitis:  in the ER he did have an episode of nausea vomiting with diarrhea, patient denies any one else in the family is sick -Was given broad-spectrum IV antibiotics in the ER -Patient states he has been "picking and digging with a knife" at the toenail bed of his left great toe: Will get x-ray to rule out osteomyelitis.  Mild redness around toenail bed but no streaking up foot -Blood cultures pending -CT scan of abdomen was obtained as well that showed apparent circumferential wall thickening involving the sigmoid colon extending upstream in the colon: Appears to be  an enteritis: Abdominal exam is unremarkable -Monitor lactic acid, initially was 4.7 but has improved to 3.2 -Advance diet as tolerated  Reported confusion/altered mental status -Patient is hard of hearing but appears to be oriented to person and place -Attempted to call wife to get more background on this issue but no answer -Ammonia slightly elevated -Check RPR, B12 -May need MRI with contrast as he has a history of skin cancer as well as prostate cancer  Acute kidney injury -Baseline creatinine appears to be greater than 60/baseline creatinine of 1 -Creatinine here elevated at 1.42 -Continue IV fluids  Postural dizziness -We will check orthostatics in the a.m. -Has received 3 L IV fluids in the  ER  DM type 2- uncontrolled -Last hemoglobin A1c from November was 6.5 -Blood sugars elevated here so we will recheck hemoglobin A1c -Hold home Metformin  History of prostate cancer -It appears his final radiation treatment was in August 2020  Hypothyroidism -Per chart review recent increase of his Synthroid for elevated TSH  Hypocalcemia -Replete IV  Incidental pulmonary nodule -Follow-up CT scan outpatient  Anxiety -We will resume low-dose diazepam as patient seems to have been taking this for a while  Covid-19 testing pending  Final reconciliation of medications by pharmacy pending   DVT prophylaxis: Heparin Code Status: Full Family Communication: Called wife to obtain more history at [phone] which is listed as her mobile number but there was no answer. Disposition Plan: Cultures pending, IV antibiotics given, IV fluids and labs in the a.m. Consults called: None    Admission status: Observation as patient seems to have improved in the ER  At the point of initial evaluation, it is my clinical opinion that admission for OBSERVATION is reasonable and necessary because the patient's presenting complaints in the context of their chronic conditions represent sufficient risk of deterioration or significant morbidity to constitute reasonable grounds for close observation in the hospital setting, but that the patient may be medically stable for discharge from the hospital within 24 to 48 hours.      Geradine Girt [facility]   How to contact the [facility] Attending or Consulting provider [place] or covering provider during after hours [place], for this patient?  1. Check the care team in [facility] and look for a) attending/consulting [facility] provider listed and b) the [facility] team listed 2. Log into [email or web address] and use [place] universal password to access. If you do not have the password, please contact the hospital operator. 3. Locate the [facility] provider you are looking for  under [facility] and page to a number that you can be directly reached. 4. If you still have difficulty reaching the provider, please page the [facility] (Director on Call) for the Hospitalists listed on amion for assistance.  07/06/2019, 10:18 AM

## 2019-07-06 NOTE — ED Provider Notes (Signed)
[facility] EMERGENCY DEPARTMENT Provider Note   CSN: [id number] Arrival date & time: 07/06/19  [id number]     History Chief Complaint  Patient presents with  . Altered Mental Status  . Cough    Dean Cole is a 69 y.o. male history of prostate cancer completed treatment in August 2020, diabetes, hypertension, hyperlipidemia presents today with his wife.  Wife reports that patient has been having increased confusion and dizziness for the past 2 months.  She reports that patient has been treated for pneumonia twice by PCP and since that time feels his health has continued to decline.  She reports that he often asks repetitive questions and seems confused.  She reports that over the past 1 week patient has fallen 3 times, mostly he falls backwards while ambulating, most recent fall 2-3 days ago striking his back on the ground and developed a bruise over his left lower ribs.  She reports that they were seen by primary care provider for this, no imaging performed per wife.  She reports that patient has appeared more confused and weaker since most recent fall.  Last night he developed multiple episodes of nonbloody/nonbilious emesis.  Patient reports that he has been feeling weaker for some time now he cannot quite tell me a definite amount of time.  He reports that he is currently not experiencing any pain.  He is alert and oriented to self, place, year but not to date or event.  He does have rambling speech while talking and asks repetitive questions.  No history of recent fever/chills, vision changes, cough/hemoptysis, abdominal pain, diarrhea, dysuria/hematuria, numbness/tingling, weakness or any additional concerns.  HPI     Past Medical History:  Diagnosis Date  . Anxiety   . Cancer ([place])    skin cancer  basel cell carcinoma  . Cholecystitis   . Controlled type 2 DM with peripheral circulatory disorder ([place]) 09/20/2014  . Coronary artery disease   . Depression   .  Headache(784.0)   . HTN (hypertension)   . Hx of skin cancer, basal cell   . Hyperlipidemia   . Hypothyroidism   . PAD (peripheral artery disease) ([place]) 09/20/2014  . Prostate cancer ([place])   . Restless leg syndrome   . Skin cancer    melanoma    Patient Active Problem List   Diagnosis Date Noted  . N&V (nausea and vomiting) 07/06/2019  . Malignant neoplasm of prostate ([place]) 07/23/2018  . Cellulitis of toe 05/23/2018  . Acquired deformity of toenail 05/15/2018  . Foot callus 04/10/2018  . Long toenail 04/10/2018  . Encounter for Medicare annual wellness exam 12/19/2017  . Ureteral stone with hydronephrosis 08/04/2017  . Anxiety, generalized 03/19/2017  . Essential hypertension 03/19/2017  . Moderate episode of recurrent major depressive disorder ([place]) 03/19/2017  . Hearing loss 04/19/2016  . PAD (peripheral artery disease) ([place]) 09/20/2014  . Controlled type 2 DM with peripheral circulatory disorder ([place]) 09/20/2014  . Depression 09/16/2014  . Erectile dysfunction 07/02/2014  . Type II diabetes mellitus with renal manifestations ([place]) 03/04/2014  . Eczema 12/25/2012  . Anxiety   . Dyslipidemia 02/29/2012  . Hypertensive heart disease without CHF   . Hypothyroidism   . Coronary artery disease   . Basal cell carcinoma   . Headache   . Restless leg syndrome   . Melanoma of skin ([place]) 06/14/2001    Past Surgical History:  Procedure Laterality Date  . BASAL CELL CARCINOMA EXCISION  2000   Dr,Arkin   .  CARDIAC CATHETERIZATION  07/07/2011  . CHOLECYSTECTOMY  09/14/2011   Procedure: LAPAROSCOPIC CHOLECYSTECTOMY WITH INTRAOPERATIVE CHOLANGIOGRAM;  Surgeon: Gwenyth Ober, MD;  Location: [place];  Service: General;  Laterality: N/A;  . CORONARY ARTERY BYPASS GRAFT  07/10/2011   Procedure: CORONARY ARTERY BYPASS GRAFTING (CABG);  Surgeon: Ivin Poot, MD;  Location: [place];  Service: Open Heart Surgery;  Laterality: N/A;  . EXCISIONAL HEMORRHOIDECTOMY  1997  . HEMORROIDECTOMY   2000  . LEFT HEART CATHETERIZATION WITH CORONARY ANGIOGRAM N/A 07/07/2011   Procedure: LEFT HEART CATHETERIZATION WITH CORONARY ANGIOGRAM;  Surgeon: Burnell Blanks, MD;  Location: [facility] CATH LAB;  Service: Cardiovascular;  Laterality: N/A;       Family History  Problem Relation Age of Onset  . Coronary artery disease Father   . Prostate cancer Father   . Leukemia Mother   . Suicidality Brother   . Coronary artery disease Paternal Grandfather   . Breast cancer Neg Hx   . Colon cancer Neg Hx   . Pancreatic cancer Neg Hx     Social History   Tobacco Use  . Smoking status: Former Smoker    Packs/day: 1.00    Years: 21.00    Pack years: 21.00    Types: Cigarettes    Quit date: 08/08/1981    Years since quitting: 37.9  . Smokeless tobacco: Former Network engineer Use Topics  . Alcohol use: No    Comment: former  . Drug use: No    Home Medications Prior to Admission medications   Medication Sig Start Date End Date Taking? Authorizing Provider  aspirin EC 81 MG tablet Take 1 tablet (81 mg total) by mouth daily. 12/16/18   Nahser, Wonda Cheng, MD  Calcium Carbonate-Vitamin D (CALCIUM-VITAMIN D3 PO) Take by mouth.    [provider]  clotrimazole-betamethasone (LOTRISONE) cream APPLY TO AFFECTED AREA TWICE A DAY 04/28/19   Trula Slade, DPM  diazepam (VALIUM) 5 MG tablet TAKE 1 TABLET BY MOUTH FOUR TIMES A DAY AS NEEDED FOR NERVES 02/21/17   Reed, Tiffany L, DO  doxycycline (VIBRAMYCIN) 100 MG capsule Take 1 capsule (100 mg total) by mouth 2 (two) times daily. One po bid x 7 days 06/13/19   Elnora Morrison, MD  gabapentin (NEURONTIN) 100 MG capsule Take 1 capsule (100 mg total) by mouth 3 (three) times daily. 02/21/19   Trula Slade, DPM  imipramine (TOFRANIL) 25 MG tablet TAKE 3 TABLETS EVERY MORNING AND 4 TABLETS EVERY NIGHT FOR ANXIETY AND DEPRESSION 12/23/18   [provider]  levothyroxine (SYNTHROID, LEVOTHROID) 125 MCG tablet  06/17/18   [provider]  metFORMIN (GLUCOPHAGE) 500 MG tablet Take 1 tablet (500 mg total) by mouth 2 (two) times daily with a meal. 08/12/15   Mast, Man X, NP  metoprolol succinate (TOPROL-XL) 25 MG 24 hr tablet TAKE 1 TABLET BY MOUTH EVERY DAY 07/17/18   [provider]  mometasone (ELOCON) 0.1 % cream  06/19/18   [provider]  mupirocin ointment (BACTROBAN) 2 % Apply 1 application topically 2 (two) times daily. 01/10/19   Trula Slade, DPM  MYRBETRIQ 25 MG TB24 tablet TAKE 1 TABLET BY MOUTH EVERY DAY 04/27/19   Bruning, Ashlyn, PA-C  NITROSTAT 0.4 MG SL tablet Place 0.4 mg under the tongue every 5 (five) minutes as needed. Reported on 10/26/2015 07/06/11   [provider]  ondansetron (ZOFRAN-ODT) 4 MG disintegrating tablet  05/21/18   [provider]  pentazocine-naloxone Loma Messing  NX) 50-0.5 MG tablet One tablet daily as needed for headache 07/18/18   [provider]  phenazopyridine (PYRIDIUM) 200 MG tablet Take 1 tablet (200 mg total) by mouth 3 (three) times daily as needed for pain. 11/29/18   Tyler Pita, MD  polyethylene glycol [facility] / Floria Raveling) packet Take by mouth.    [provider]  simvastatin (ZOCOR) 20 MG tablet TAKE ONE TABLET BY MOUTH ONCE DAILY FOR CHOLESTEROL 10/16/16   Reed, Tiffany L, DO  tamsulosin (FLOMAX) 0.4 MG CAPS capsule Take 0.4 mg by mouth 2 (two) times daily.    [provider]    Allergies    Eggs or egg-derived products and Adhesive [tape]  Review of Systems   Review of Systems Ten systems are reviewed and are negative for acute change except as noted in the HPI  Physical Exam Updated Vital Signs BP 112/69   Pulse (!) 111   Temp (!) 100.5 F (38.1 C) (Rectal)   Resp 20   Ht 5\' 10"  (1.778 m)   Wt 84.8 kg   SpO2 92%   BMI 26.83 kg/m   Physical Exam Constitutional:      General: He is not in acute distress.    Appearance: Normal appearance. He is well-developed. He is not ill-appearing or  diaphoretic.  HENT:     Head: Normocephalic and atraumatic.     Right Ear: External ear normal.     Left Ear: External ear normal.     Nose: Nose normal.  Eyes:     General: Vision grossly intact. Gaze aligned appropriately.     Pupils: Pupils are equal, round, and reactive to light.  Neck:     Trachea: Trachea and phonation normal. No tracheal deviation.  Cardiovascular:     Rate and Rhythm: Regular rhythm. Tachycardia present.     Pulses: Normal pulses.  Pulmonary:     Effort: Pulmonary effort is normal. No respiratory distress.     Breath sounds: Normal breath sounds.  Abdominal:     General: There is no distension.     Palpations: Abdomen is soft.     Tenderness: There is no abdominal tenderness. There is no guarding or rebound.  Musculoskeletal:        General: Normal range of motion.     Cervical back: Normal range of motion.       Back:     Comments: No midline C/T/L spinal tenderness to palpation, no deformity, crepitus, or step-off noted. No sign of injury to the neck or back.  Skin:    General: Skin is warm and dry.  Neurological:     Mental Status: He is alert.     GCS: GCS eye subscore is 4. GCS verbal subscore is 5. GCS motor subscore is 6.     Comments: Speech is clear and goal oriented, follows commands Major Cranial nerves without deficit, no facial droop Moves extremities without ataxia, coordination intact  Psychiatric:        Behavior: Behavior normal.     ED Results / Procedures / Treatments   Labs (all labs ordered are listed, but only abnormal results are displayed) Labs Reviewed  CBC WITH DIFFERENTIAL/PLATELET - Abnormal; Notable for the following components:      Result Value   Hemoglobin 12.9 (*)    Platelets 95 (*)    Lymphs Abs 0.5 (*)    All other components within normal limits  BASIC METABOLIC PANEL - Abnormal; Notable for the following components:  Glucose, Bld 312 (*)    Creatinine, Ser 1.42 (*)    Calcium 8.3 (*)    GFR calc non  Af Amer 50 (*)    GFR calc Af Amer 58 (*)    All other components within normal limits  HEPATIC FUNCTION PANEL - Abnormal; Notable for the following components:   AST 60 (*)    All other components within normal limits  LACTIC ACID, PLASMA - Abnormal; Notable for the following components:   Lactic Acid, Venous 4.7 (*)    All other components within normal limits  AMMONIA - Abnormal; Notable for the following components:   Ammonia 39 (*)    All other components within normal limits  URINALYSIS, ROUTINE W REFLEX MICROSCOPIC - Abnormal; Notable for the following components:   Color, Urine AMBER (*)    APPearance HAZY (*)    Glucose, UA 50 (*)    Ketones, ur 5 (*)    Protein, ur 30 (*)    All other components within normal limits  TSH - Abnormal; Notable for the following components:   TSH 6.509 (*)    All other components within normal limits  I-STAT CHEM 8, ED - Abnormal; Notable for the following components:   Glucose, Bld 270 (*)    Calcium, Ion 0.99 (*)    All other components within normal limits  CBG MONITORING, ED - Abnormal; Notable for the following components:   Glucose-Capillary 243 (*)    All other components within normal limits  CULTURE, BLOOD (ROUTINE X 2)  CULTURE, BLOOD (ROUTINE X 2)  URINE CULTURE  SARS CORONAVIRUS 2 (TAT 6-24 HRS)  LIPASE, BLOOD  PROTIME-INR  ETHANOL  APTT  T4, FREE  LACTIC ACID, PLASMA  TROPONIN I (HIGH SENSITIVITY)  TROPONIN I (HIGH SENSITIVITY)    EKG EKG Interpretation  Date/Time:  Sunday July 06 2019 07:08:29 EST Ventricular Rate:  112 PR Interval:    QRS Duration: 110 QT Interval:  373 QTC Calculation: 510 R Axis:   46 Text Interpretation: Sinus tachycardia Borderline repolarization abnormality Prolonged QT interval Baseline wander in lead(s) V2 No significant change since last tracing Confirmed by Lennice Sites [phone]) on 07/06/2019 7:34:03 AM   Radiology CT Head Wo Contrast  Result Date: 07/06/2019 CLINICAL DATA:   Altered mental status. EXAM: CT HEAD WITHOUT CONTRAST TECHNIQUE: Contiguous axial images were obtained from the base of the skull through the vertex without intravenous contrast. COMPARISON:  None. FINDINGS: Brain: Atrophy with sulcal prominence and centralized atrophy with commensurate ex vacuo dilatation of the ventricular system. Scattered periventricular hypodensities compatible with microvascular ischemic disease. No CT evidence of acute large territory infarct. No intraparenchymal or extra-axial mass or hemorrhage. Normal configuration of the ventricles and the basilar cisterns. No midline shift. Vascular: Intracranial atherosclerosis. Skull: No displaced calvarial fracture. Sinuses/Orbits: There is underpneumatization the bilateral frontal sinuses. The remaining paranasal sinuses and mastoid air cells is normal. No air-fluid levels. Other: Regional soft tissues appear normal. IMPRESSION: Mild microvascular ischemic disease and advanced atrophy without superimposed acute intracranial process. Electronically Signed   By: Sandi Mariscal M.D.   On: 07/06/2019 08:33   CT Chest W Contrast  Result Date: 07/06/2019 CLINICAL DATA:  Multiple falls over the past several months. History of prostate cancer. EXAM: CT CHEST, ABDOMEN, AND PELVIS WITH CONTRAST TECHNIQUE: Multidetector CT imaging of the chest, abdomen and pelvis was performed following the standard protocol during bolus administration of intravenous contrast. CONTRAST:  135mL OMNIPAQUE IOHEXOL 300 MG/ML  SOLN COMPARISON:  CT  abdomen pelvis-06/26/2018; 09/11/2011 FINDINGS: CT CHEST FINDINGS Cardiovascular: Normal heart size. Post median sternotomy and CABG with calcifications within the native coronary arteries. No pericardial effusion. Normal caliber of the thoracic aorta. No evidence of thoracic aortic dissection or periaortic stranding on this nongated examination. The left vertebral artery is incidentally noted to arise directly from the aortic arch. The  branch vessels of the aortic arch appear patent throughout their imaged courses. Although this examination was not tailored for the evaluation the pulmonary arteries, there are no discrete filling defects within the central pulmonary arterial tree to suggest central pulmonary embolism. Normal caliber the main pulmonary artery. Mediastinum/Nodes: No bulky mediastinal, hilar or axillary lymphadenopathy. Lungs/Pleura: There is a punctate (approximate 0.5 cm) subpleural nodule within right upper lobe (image 46, series 7 as well as a smaller (approximately 0.3 cm) subpleural nodule within the left upper lobe (image 59, series 7). Subsegmental atelectasis within the bilateral lower lobes, left greater than right as well as about the anterior aspect of the right minor fissure and within the lingula. No discrete focal airspace opacities. No pleural effusion or pneumothorax. The central pulmonary airways appear widely patent. Musculoskeletal: No acute or aggressive osseous abnormalities. Mild symmetric gynecomastia. Regional soft tissues appear otherwise normal. The thyroid gland appears atrophic. _________________________________________________________ CT ABDOMEN PELVIS FINDINGS Hepatobiliary: There is diffuse decreased attenuation of the hepatic parenchyma on this postcontrast examination suggestive of hepatic steatosis. Punctate (subcentimeter) hypoattenuating lesions within the right lobe of the liver (image 62, series 6) as well as the caudate (image 62, series 6), are too small to adequately characterize though unchanged compared to the 06/2018 examination and favored to represent hepatic cysts. Post cholecystectomy. No intra or extrahepatic biliary ductal dilatation. No ascites. Pancreas: Normal appearance of the pancreas. Spleen: Normal appearance of the spleen. Adrenals/Urinary Tract: There is symmetric enhancement and excretion of the bilateral kidneys. Solitary punctate (2 mm) nonobstructing renal stones are seen  bilaterally (right coronal image 104, series 8; left coronal image 87, series 8). No discrete renal lesions. There is a minimal amount of grossly symmetric bilateral perinephric stranding. No urine obstruction. There is mild thickening of the left adrenal gland without discrete nodule. Normal appearance of the right adrenal gland. There is a approximately 0.6 cm stone lying dependently within the urinary bladder (image 116, series 6, similar to the 06/26/2018 examination. Mild thickening of the urinary bladder wall. Stomach/Bowel: There is circumferential wall thickening involving the sigmoid colon to the level of the rectum (representative images 102 and 107, series 6), with associated minimal amount of fluid in the pelvic cul-de-sac but without definable/drainable fluid collection (sagittal image 99, series 9). This finding is associated with mild patulous distension of the upstream colon, not definitely resulting in enteric obstruction. Circumferential wall thickening involving several loops of mid jejunum (representative image 52 and 63, series 6), similar to the 06/2018 examination though new compared to the 08/2011 examination. Normal appearance of the terminal ileum and the retrocecal appendix. No pneumoperitoneum, pneumatosis or portal venous gas. Vascular/Lymphatic: There is a moderate to large amount of eccentric mixed calcified and noncalcified atherosclerotic plaque throughout the normal caliber abdominal aorta. Moderate to large amount calcified atherosclerotic plaque involving the right common iliac artery approaches 50% luminal narrowing (image 93, series 6, similar to the 2013 examination. No bulky mediastinal, hilar or axillary lymphadenopathy. 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 Other: There is a minimal amount of ill-defined subcutaneous edema about  the left flank. Regional soft tissues appear  otherwise normal. Musculoskeletal: No acute or aggressive osseous abnormalities within the abdomen or pelvis. Mild multilevel lumbar spine DDD, worse at L3-L4 and L4-L5 with disc space height loss, endplate irregularity and sclerosis. IMPRESSION: Chest CT impression: 1. No acute cardiopulmonary disease. 2. Post median sternotomy and CABG. Calcifications within native coronary arteries. Aortic Atherosclerosis (ICD10-I70.0). 3. Bilateral punctate pulmonary nodules, largest of which within the right upper lobe measures 0.5 cm in diameter. No follow-up needed if patient is low-risk (and has no known or suspected primary neoplasm). Non-contrast chest CT can be considered in 12 months if patient is high-risk. This recommendation follows the consensus statement: Guidelines for Management of Incidental Pulmonary Nodules Detected on CT Images: From the Fleischner Society 2017; Radiology 2017; 284:228-243. Abdomen and pelvic CT impression: 1. Apparent circumferential wall thickening involving the sigmoid colon extending with patulous distension of the upstream colon, though not resulting in enteric obstruction. Findings are nonspecific though could be seen in the setting of an enteritis. Further evaluation with colonoscopy could be performed as indicated. 2. Mild circumferential wall thickening involving several loops of mid small bowel, not resulting in enteric obstruction, similar to the 06/2018 examination while potentially secondary to peristalsis though additional location of an enteritis could have a similar appearance. 3. Redemonstrated 0.6 cm urinary bladder calculus, similar to the 06/2018 examination. 4. Redemonstrated solitary punctate (2 mm) nonobstructing bilateral renal stones. 5. Suspected hepatic steatosis.  Correlation with LFTs is advised. Electronically Signed   By: Sandi Mariscal M.D.   On: 07/06/2019 09:02   CT Cervical Spine Wo Contrast  Result Date: 07/06/2019 CLINICAL DATA:  Dizziness, multiple falls,  history of prostate cancer EXAM: CT CERVICAL SPINE WITHOUT CONTRAST TECHNIQUE: Multidetector CT imaging of the cervical spine was performed without intravenous contrast. Multiplanar CT image reconstructions were also generated. COMPARISON:  None. FINDINGS: Alignment: Anteroposterior alignment is maintained. Skull base and vertebrae: Vertebral body heights are preserved. There is no suspicious osseous lesion. No acute fracture. Soft tissues and spinal canal: No prevertebral fluid or swelling. No visible canal hematoma. Disc levels:  There is no significant degenerative stenosis. Upper chest: Negative. Other: None. IMPRESSION: No significant or acute osseous abnormality. Electronically Signed   By: Macy Mis M.D.   On: 07/06/2019 08:45   CT ABDOMEN PELVIS W CONTRAST  Result Date: 07/06/2019 CLINICAL DATA:  Multiple falls over the past several months. History of prostate cancer. EXAM: CT CHEST, ABDOMEN, AND PELVIS WITH CONTRAST TECHNIQUE: Multidetector CT imaging of the chest, abdomen and pelvis was performed following the standard protocol during bolus administration of intravenous contrast. CONTRAST:  174mL OMNIPAQUE IOHEXOL 300 MG/ML  SOLN COMPARISON:  CT abdomen pelvis-06/26/2018; 09/11/2011 FINDINGS: CT CHEST FINDINGS Cardiovascular: Normal heart size. Post median sternotomy and CABG with calcifications within the native coronary arteries. No pericardial effusion. Normal caliber of the thoracic aorta. No evidence of thoracic aortic dissection or periaortic stranding on this nongated examination. The left vertebral artery is incidentally noted to arise directly from the aortic arch. The branch vessels of the aortic arch appear patent throughout their imaged courses. Although this examination was not tailored for the evaluation the pulmonary arteries, there are no discrete filling defects within the central pulmonary arterial tree to suggest central pulmonary embolism. Normal caliber the main pulmonary  artery. Mediastinum/Nodes: No bulky mediastinal, hilar or axillary lymphadenopathy. Lungs/Pleura: There is a punctate (approximate 0.5 cm) subpleural nodule within right upper lobe (image 46, series 7 as well as a smaller (approximately  0.3 cm) subpleural nodule within the left upper lobe (image 59, series 7). Subsegmental atelectasis within the bilateral lower lobes, left greater than right as well as about the anterior aspect of the right minor fissure and within the lingula. No discrete focal airspace opacities. No pleural effusion or pneumothorax. The central pulmonary airways appear widely patent. Musculoskeletal: No acute or aggressive osseous abnormalities. Mild symmetric gynecomastia. Regional soft tissues appear otherwise normal. The thyroid gland appears atrophic. _________________________________________________________ CT ABDOMEN PELVIS FINDINGS Hepatobiliary: There is diffuse decreased attenuation of the hepatic parenchyma on this postcontrast examination suggestive of hepatic steatosis. Punctate (subcentimeter) hypoattenuating lesions within the right lobe of the liver (image 62, series 6) as well as the caudate (image 62, series 6), are too small to adequately characterize though unchanged compared to the 06/2018 examination and favored to represent hepatic cysts. Post cholecystectomy. No intra or extrahepatic biliary ductal dilatation. No ascites. Pancreas: Normal appearance of the pancreas. Spleen: Normal appearance of the spleen. Adrenals/Urinary Tract: There is symmetric enhancement and excretion of the bilateral kidneys. Solitary punctate (2 mm) nonobstructing renal stones are seen bilaterally (right coronal image 104, series 8; left coronal image 87, series 8). No discrete renal lesions. There is a minimal amount of grossly symmetric bilateral perinephric stranding. No urine obstruction. There is mild thickening of the left adrenal gland without discrete nodule. Normal appearance of the right  adrenal gland. There is a approximately 0.6 cm stone lying dependently within the urinary bladder (image 116, series 6, similar to the 06/26/2018 examination. Mild thickening of the urinary bladder wall. Stomach/Bowel: There is circumferential wall thickening involving the sigmoid colon to the level of the rectum (representative images 102 and 107, series 6), with associated minimal amount of fluid in the pelvic cul-de-sac but without definable/drainable fluid collection (sagittal image 99, series 9). This finding is associated with mild patulous distension of the upstream colon, not definitely resulting in enteric obstruction. Circumferential wall thickening involving several loops of mid jejunum (representative image 52 and 63, series 6), similar to the 06/2018 examination though new compared to the 08/2011 examination. Normal appearance of the terminal ileum and the retrocecal appendix. No pneumoperitoneum, pneumatosis or portal venous gas. Vascular/Lymphatic: There is a moderate to large amount of eccentric mixed calcified and noncalcified atherosclerotic plaque throughout the normal caliber abdominal aorta. Moderate to large amount calcified atherosclerotic plaque involving the right common iliac artery approaches 50% luminal narrowing (image 93, series 6, similar to the 2013 examination. No bulky mediastinal, hilar or axillary lymphadenopathy. 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 Other: There is a minimal amount of ill-defined subcutaneous edema about the left flank. Regional soft tissues appear otherwise normal. Musculoskeletal: No acute or aggressive osseous abnormalities within the abdomen or pelvis. Mild multilevel lumbar spine DDD, worse at L3-L4 and L4-L5 with disc space height loss, endplate irregularity and sclerosis. IMPRESSION: Chest CT impression: 1. No acute cardiopulmonary disease. 2. Post median  sternotomy and CABG. Calcifications within native coronary arteries. Aortic Atherosclerosis (ICD10-I70.0). 3. Bilateral punctate pulmonary nodules, largest of which within the right upper lobe measures 0.5 cm in diameter. No follow-up needed if patient is low-risk (and has no known or suspected primary neoplasm). Non-contrast chest CT can be considered in 12 months if patient is high-risk. This recommendation follows the consensus statement: Guidelines for Management of Incidental Pulmonary Nodules Detected on CT Images: From the Fleischner Society 2017; Radiology 2017; 284:228-243. Abdomen and pelvic CT impression:  1. Apparent circumferential wall thickening involving the sigmoid colon extending with patulous distension of the upstream colon, though not resulting in enteric obstruction. Findings are nonspecific though could be seen in the setting of an enteritis. Further evaluation with colonoscopy could be performed as indicated. 2. Mild circumferential wall thickening involving several loops of mid small bowel, not resulting in enteric obstruction, similar to the 06/2018 examination while potentially secondary to peristalsis though additional location of an enteritis could have a similar appearance. 3. Redemonstrated 0.6 cm urinary bladder calculus, similar to the 06/2018 examination. 4. Redemonstrated solitary punctate (2 mm) nonobstructing bilateral renal stones. 5. Suspected hepatic steatosis.  Correlation with LFTs is advised. Electronically Signed   By: Sandi Mariscal M.D.   On: 07/06/2019 09:02   DG Chest Portable 1 View  Result Date: 07/06/2019 CLINICAL DATA:  69 year old male with history of trauma from a fall. Evaluate for potential pneumonia. EXAM: PORTABLE CHEST 1 VIEW COMPARISON:  Chest x-ray 06/13/2019. FINDINGS: Lung volumes are low. Linear opacities in the left base may reflect areas of subsegmental atelectasis or scarring. No consolidative airspace disease. No pleural effusions. No pneumothorax. No  pulmonary nodule or mass noted. Pulmonary vasculature and the cardiomediastinal silhouette are within normal limits. Status post median sternotomy for CABG. IMPRESSION: 1. Low lung volumes with probable subsegmental atelectasis in the left base. 2. Postoperative changes, as above. Electronically Signed   By: Vinnie Langton M.D.   On: 07/06/2019 07:30    Procedures .Critical Care Performed by: Deliah Boston, PA-C Authorized by: Deliah Boston, PA-C   Critical care provider statement:    Critical care time (minutes):  32   Critical care was time spent personally by me on the following activities:  Discussions with consultants, evaluation of patient's response to treatment, examination of patient, ordering and performing treatments and interventions, ordering and review of laboratory studies, ordering and review of radiographic studies, pulse oximetry, re-evaluation of patient's condition, obtaining history from patient or surrogate, review of old charts and development of treatment plan with patient or surrogate   (including critical care time)  Medications Ordered in ED Medications  sodium chloride 0.9 % bolus 500 mL (has no administration in time range)  metroNIDAZOLE (FLAGYL) IVPB 500 mg (has no administration in time range)  vancomycin (VANCOREADY) IVPB 1750 mg/350 mL (has no administration in time range)  vancomycin (VANCOREADY) IVPB 1500 mg/300 mL (has no administration in time range)  ceFEPIme (MAXIPIME) 2 g in sodium chloride 0.9 % 100 mL IVPB (has no administration in time range)  sodium chloride 0.9 % bolus 1,000 mL (1,000 mLs Intravenous New Bag/Given 07/06/19 0719)  iohexol (OMNIPAQUE) 300 MG/ML solution 100 mL (100 mLs Intravenous Contrast Given 07/06/19 0830)  sodium chloride 0.9 % bolus 1,000 mL (1,000 mLs Intravenous New Bag/Given 07/06/19 0925)  ceFEPIme (MAXIPIME) 2 g in sodium chloride 0.9 % 100 mL IVPB (2 g Intravenous New Bag/Given 07/06/19 0915)    ED Course  I have  reviewed the triage vital signs and the nursing notes.  Pertinent labs & imaging results that were available during my care of the patient were reviewed by me and considered in my medical decision making (see chart for details).    MDM Rules/Calculators/A&P                     69 year old male with history as detailed above presents today with his wife for multiple concerns.  Primarily dizziness and increasing confusion over the past 2 months, also  with recurrent pneumonia recently finished treatment.  Additionally with multiple falls over the past week, bruising to the left lower ribs and finally nausea and vomiting that began this morning.  On initial evaluation he is pleasant smiling and in no acute distress.  He is mildly tachycardic on arrival with heart rate approximately 110 bpm.  No cranial nerve deficits, no evidence of significant head injury.  He has no pain at the neck and has appropriate range of motion.  He denies any chest pain or difficulty breathing, heart and lungs sound clear.  He has bruising to the left posterior lower ribs and tenderness at that area, no midline spinal tenderness crepitus step-off or deformity.  He has no neurologic complaints suggestive of cauda equina.  His abdomen is soft and nontender no evidence of pain or injury.  He is neurovascular intact to all 4 extremities with equal pulses and strength.  Denies dizziness on initial evaluation in his no obvious neuro deficits suggestive of a CVA at this time.  Will obtain a CT scan of patient's head for evaluation.  Additionally patient is tachycardic, recent treatment of pneumonia per wife.  He does not appear septic/toxic at this point.  Concern that due to recent fall patient may have had an intrathoracic or intra-abdominal blunt trauma injury.  Will obtain CT scan of the chest abdomen and pelvis.  Additionally will obtain broad work-up including blood cultures, lactic, abdominal labs, troponin, EKG, urinalysis and AMS  labs including ethanol and ammonia.  Fluid bolus given.  I have asked nursing staff to obtain a rectal temperature.  Case discussed with Dr. Ronnald Nian. - Patient seen and evaluated by Dr. Ronnald Nian, thyroid studies added.   - 8:16 AM : Rectal temperature reveals low-grade fever of 100.5 F additionally patient's lactic is elevated at 4.7.  Patient now meets SIRS criteria, empiric antibiotics for unknown source, Vanc/cefepime/Flagyl have been ordered.  Additionally the remainder of fluids for 30 cc/kg have been ordered. - EKG: Sinus tachycardia Borderline repolarization abnormality Prolonged QT interval Baseline wander in lead(s) V2 No significant change since last tracing Confirmed by Lennice Sites [id number]) on 07/06/2019 7:34:03 AM  CXR:  IMPRESSION:  1. Low lung volumes with probable subsegmental atelectasis in the  left base.  2. Postoperative changes, as above.  I have personally reviewed patient's chest x-ray and agree with radiologist interpretation.  CT Head:  IMPRESSION:  Mild microvascular ischemic disease and advanced atrophy without  superimposed acute intracranial process.   CT C-Spine:    IMPRESSION:  No significant or acute osseous abnormality.   CT Chest/AP:  IMPRESSION:  Chest CT impression:    1. No acute cardiopulmonary disease.  2. Post median sternotomy and CABG. Calcifications within native  coronary arteries. Aortic Atherosclerosis (ICD10-I70.0).  3. Bilateral punctate pulmonary nodules, largest of which within the  right upper lobe measures 0.5 cm in diameter. No follow-up needed if  patient is low-risk (and has no known or suspected primary  neoplasm). Non-contrast chest CT can be considered in 12 months if  patient is high-risk. This recommendation follows the consensus  statement: Guidelines for Management of Incidental Pulmonary Nodules  Detected on CT Images: From the Fleischner Society 2017; Radiology  2017; 284:228-243.    Abdomen and pelvic CT  impression:    1. Apparent circumferential wall thickening involving the sigmoid  colon extending with patulous distension of the upstream colon,  though not resulting in enteric obstruction. Findings are  nonspecific though could be seen in the setting of  an enteritis.  Further evaluation with colonoscopy could be performed as indicated.  2. Mild circumferential wall thickening involving several loops of  mid small bowel, not resulting in enteric obstruction, similar to  the 06/2018 examination while potentially secondary to peristalsis  though additional location of an enteritis could have a similar  appearance.  3. Redemonstrated 0.6 cm urinary bladder calculus, similar to the  06/2018 examination.  4. Redemonstrated solitary punctate (2 mm) nonobstructing bilateral  renal stones.  5. Suspected hepatic steatosis. Correlation with LFTs is advised.   Lab review: CBC without leukocytosis, hemoglobin of 12.9 nonacute.  Thrombocytopenia of 95, again no hemorrhagic symptoms, Covid test is pending.  Urinalysis shows evidence of dehydration no evidence of infection.  Or high-sensitivity troponin within normal limits.  Lipase within normal limits no evidence of pancreatitis.  LFTs show mild elevation of AST no other elevations seen.  BMP shows AKI with creatinine of 1.42, glucose elevated at 312.  No evidence of DKA at this time, borderline slightly of 15, bicarb within normal limits no acute electrolyte derangements.  Ammonia slightly elevated at 39, doubt hepatic encephalopathy at this time.  At the low with normal limits no evidence of intoxication withdrawal.  PT/INR and APTT within normal limits.  TSH elevated, free T4 within normal limits. - 8:55 AM: I went to reevaluate the patient he was not in bed.  I found the patient walking with his wife out of yellow zone in a different area of our emergency department.  He is ambulating without assistance or difficulty.  Wife reports that he became lost  while trying to go use the bathroom.  Patient is well-appearing in no acute distress.  He returned to bed without assistance or difficulty.  Consult called to hospitalist for admission.  At this time Covid test is pending, he does meet sepsis criteria, possible enteritis source.  Suspect dehydration likely contributing to lactic acidosis and elevation of creatinine.  Consult called to Dr. Eliseo Squires from hospitalist service who has accepted patient for admission.  Dean Cole was evaluated in Emergency Department on 07/06/2019 for the symptoms described in the history of present illness. He was evaluated in the context of the global COVID-19 pandemic, which necessitated consideration that the patient might be at risk for infection with the SARS-CoV-2 virus that causes COVID-19. Institutional protocols and algorithms that pertain to the evaluation of patients at risk for COVID-19 are in a state of rapid change based on information released by regulatory bodies including the CDC and federal and state organizations. These policies and algorithms were followed during the patient's care in the ED.  Note: Portions of this report may have been transcribed using voice recognition software. Every effort was made to ensure accuracy; however, inadvertent computerized transcription errors may still be present. Final Clinical Impression(s) / ED Diagnoses Final diagnoses:  Sepsis with acute renal failure without septic shock, due to unspecified organism, unspecified acute renal failure type [facility])  Dehydration    Rx / DC Orders ED Discharge Orders    None       Gari Crown 07/06/19 [place], [place], DO 07/06/19 [id number]

## 2019-07-06 NOTE — Progress Notes (Signed)
Pharmacy Antibiotic Note  Dean Cole is a 69 y.o. male admitted on 07/06/2019 with AMS for 1 month PTA and dizziness.  Pharmacy has been consulted for vancomycin and cefepime dosing for sepsis of unknown origin.  SCr 1.2, CrCL 61 ml/min, Tmax 100.5, WBC WNL, LA elevated at 4.7.  Plan: Vanc 1750mg  IV x 1, then 1500mg  IV Q24H for AUC 448 using SCr 1.2 Cefepime 2gm IV Q8H Monitor renal fxn, clinical progress, vanc AUC as indicated  Height: 5\' 10"  (177.8 cm) Weight: 187 lb (84.8 kg) IBW/kg (Calculated) : 73  Temp (24hrs), Avg:99.6 F (37.6 C), Min:98.7 F (37.1 C), Max:100.5 F (38.1 C)  Recent Labs  Lab 07/06/19 0630 07/06/19 0713 07/06/19 0727  WBC 5.5  --   --   CREATININE  --   --  1.20  LATICACIDVEN  --  4.7*  --     Estimated Creatinine Clearance: 60.8 mL/min (by C-G formula based on SCr of 1.2 mg/dL).    Allergies  Allergen Reactions  . Eggs Or Egg-Derived Products Other (See Comments)    Bloating, nausea, and no rash   . Adhesive [Tape] Swelling    Vanc 3/7 >> Cefepime 3/7 >> Flagyl x1 3/7 >>  3/7 UCx -  3/7 BCx -   Yossi Hinchman D. Mina Marble, PharmD, BCPS, [place] 07/06/2019, 8:35 AM

## 2019-07-06 NOTE — Progress Notes (Signed)
Requests sleep med. States he takes Valium at home. Page out to MD with request.

## 2019-07-06 NOTE — ED Triage Notes (Addendum)
Pt brought to ED via EMS from home with c/o of dizziness when standing x2 months. Wife reports AMS for about 1 month. Pt HR 120 initially with EMS, 500 mL NS admin pta. Hx: prostate cancer last year, cardiac bypass sx about 8 years ago. No syncopal episodes associated with dizziness. Wife reports pt has fallen multiple times in the last 3 months. Denies chest pain. Wife reports pt has had recent episodes of n/v/h/shortness of breath. Currently A&O x3, disoriented to time; 100% on 2L [place]. Wife at bedside.

## 2019-07-07 DIAGNOSIS — N179 Acute kidney failure, unspecified: Secondary | ICD-10-CM | POA: Diagnosis present

## 2019-07-07 DIAGNOSIS — K529 Noninfective gastroenteritis and colitis, unspecified: Secondary | ICD-10-CM | POA: Diagnosis not present

## 2019-07-07 DIAGNOSIS — R6520 Severe sepsis without septic shock: Secondary | ICD-10-CM | POA: Diagnosis not present

## 2019-07-07 DIAGNOSIS — E860 Dehydration: Secondary | ICD-10-CM | POA: Diagnosis not present

## 2019-07-07 DIAGNOSIS — R4182 Altered mental status, unspecified: Secondary | ICD-10-CM | POA: Diagnosis present

## 2019-07-07 DIAGNOSIS — M869 Osteomyelitis, unspecified: Secondary | ICD-10-CM | POA: Diagnosis not present

## 2019-07-07 DIAGNOSIS — F339 Major depressive disorder, recurrent, unspecified: Secondary | ICD-10-CM | POA: Diagnosis not present

## 2019-07-07 DIAGNOSIS — A419 Sepsis, unspecified organism: Secondary | ICD-10-CM | POA: Diagnosis not present

## 2019-07-07 DIAGNOSIS — I4581 Long QT syndrome: Secondary | ICD-10-CM | POA: Diagnosis not present

## 2019-07-07 DIAGNOSIS — I2510 Atherosclerotic heart disease of native coronary artery without angina pectoris: Secondary | ICD-10-CM | POA: Diagnosis not present

## 2019-07-07 DIAGNOSIS — E039 Hypothyroidism, unspecified: Secondary | ICD-10-CM | POA: Diagnosis not present

## 2019-07-07 DIAGNOSIS — I10 Essential (primary) hypertension: Secondary | ICD-10-CM | POA: Diagnosis not present

## 2019-07-07 DIAGNOSIS — I6782 Cerebral ischemia: Secondary | ICD-10-CM | POA: Diagnosis not present

## 2019-07-07 DIAGNOSIS — L309 Dermatitis, unspecified: Secondary | ICD-10-CM | POA: Diagnosis present

## 2019-07-07 DIAGNOSIS — E1165 Type 2 diabetes mellitus with hyperglycemia: Secondary | ICD-10-CM | POA: Diagnosis not present

## 2019-07-07 DIAGNOSIS — G9341 Metabolic encephalopathy: Secondary | ICD-10-CM | POA: Diagnosis not present

## 2019-07-07 DIAGNOSIS — J9811 Atelectasis: Secondary | ICD-10-CM | POA: Diagnosis not present

## 2019-07-07 DIAGNOSIS — E1151 Type 2 diabetes mellitus with diabetic peripheral angiopathy without gangrene: Secondary | ICD-10-CM | POA: Diagnosis not present

## 2019-07-07 DIAGNOSIS — E876 Hypokalemia: Secondary | ICD-10-CM | POA: Diagnosis not present

## 2019-07-07 DIAGNOSIS — E785 Hyperlipidemia, unspecified: Secondary | ICD-10-CM | POA: Diagnosis not present

## 2019-07-07 DIAGNOSIS — R627 Adult failure to thrive: Secondary | ICD-10-CM | POA: Diagnosis not present

## 2019-07-07 DIAGNOSIS — D696 Thrombocytopenia, unspecified: Secondary | ICD-10-CM | POA: Diagnosis not present

## 2019-07-07 DIAGNOSIS — D61818 Other pancytopenia: Secondary | ICD-10-CM | POA: Diagnosis not present

## 2019-07-07 DIAGNOSIS — R42 Dizziness and giddiness: Secondary | ICD-10-CM

## 2019-07-07 DIAGNOSIS — G2581 Restless legs syndrome: Secondary | ICD-10-CM | POA: Diagnosis present

## 2019-07-07 DIAGNOSIS — W1839XA Other fall on same level, initial encounter: Secondary | ICD-10-CM | POA: Diagnosis not present

## 2019-07-07 DIAGNOSIS — Z20822 Contact with and (suspected) exposure to covid-19: Secondary | ICD-10-CM | POA: Diagnosis not present

## 2019-07-07 DIAGNOSIS — D649 Anemia, unspecified: Secondary | ICD-10-CM | POA: Diagnosis not present

## 2019-07-07 LAB — C DIFFICILE QUICK SCREEN W PCR REFLEX
C Diff antigen: NEGATIVE
C Diff interpretation: NOT DETECTED
C Diff toxin: NEGATIVE

## 2019-07-07 LAB — PROCALCITONIN: Procalcitonin: 4.08 ng/mL

## 2019-07-07 LAB — BASIC METABOLIC PANEL
Anion gap: 11 (ref 5–15)
BUN: 11 mg/dL (ref 8–23)
CO2: 24 mmol/L (ref 22–32)
Calcium: 7.7 mg/dL — ABNORMAL LOW (ref 8.9–10.3)
Chloride: 105 mmol/L (ref 98–111)
Creatinine, Ser: 1.02 mg/dL (ref 0.61–1.24)
GFR calc Af Amer: >60 mL/min (ref >60)
GFR calc non Af Amer: >60 mL/min (ref >60)
Glucose, Bld: 159 mg/dL — ABNORMAL HIGH (ref 70–99)
Potassium: 3.5 mmol/L (ref 3.5–5.1)
Sodium: 140 mmol/L (ref 135–145)

## 2019-07-07 LAB — CBC
HCT: 31.3 % — ABNORMAL LOW (ref 39.0–52.0)
Hemoglobin: 10.3 g/dL — ABNORMAL LOW (ref 13.0–17.0)
MCH: 29.8 pg (ref 26.0–34.0)
MCHC: 32.9 g/dL (ref 30.0–36.0)
MCV: 90.5 fL (ref 80.0–100.0)
Platelets: 69 10*3/uL — ABNORMAL LOW (ref 150–400)
RBC: 3.46 MIL/uL — ABNORMAL LOW (ref 4.22–5.81)
RDW: 14.3 % (ref 11.5–15.5)
WBC: 3.8 10*3/uL — ABNORMAL LOW (ref 4.0–10.5)
nRBC: 0 % (ref 0.0–0.2)

## 2019-07-07 LAB — GLUCOSE, CAPILLARY
Glucose-Capillary: 148 mg/dL — ABNORMAL HIGH (ref 70–99)
Glucose-Capillary: 172 mg/dL — ABNORMAL HIGH (ref 70–99)
Glucose-Capillary: 117 mg/dL — ABNORMAL HIGH (ref 70–99)
Glucose-Capillary: 128 mg/dL — ABNORMAL HIGH (ref 70–99)

## 2019-07-07 LAB — URINE CULTURE: Culture: NO GROWTH

## 2019-07-07 LAB — RPR: RPR Ser Ql: NONREACTIVE

## 2019-07-07 LAB — VITAMIN B12: Vitamin B-12: 354 pg/mL (ref 180–914)

## 2019-07-07 LAB — HEPATITIS PANEL, ACUTE
HCV Ab: NONREACTIVE
Hep A IgM: NONREACTIVE
Hep B C IgM: NONREACTIVE
Hepatitis B Surface Ag: NONREACTIVE

## 2019-07-07 LAB — PROTIME-INR
INR: 1.3 — ABNORMAL HIGH (ref 0.8–1.2)
Prothrombin Time: 16 seconds — ABNORMAL HIGH (ref 11.4–15.2)

## 2019-07-07 LAB — CORTISOL-AM, BLOOD: Cortisol - AM: 20.9 ug/dL (ref 6.7–22.6)

## 2019-07-07 MED ORDER — MIRABEGRON ER 25 MG PO TB24
25.0000 mg | ORAL_TABLET | Freq: Every day | ORAL | Status: DC
  Administered 2019-07-07 – 2019-07-08 (×2): 25 mg via ORAL
  Filled 2019-07-07 (×2): qty 1

## 2019-07-07 MED ORDER — PHENAZOPYRIDINE HCL 200 MG PO TABS
200.0000 mg | ORAL_TABLET | Freq: Three times a day (TID) | ORAL | Status: DC
  Administered 2019-07-08: 200 mg via ORAL
  Filled 2019-07-07 (×3): qty 1

## 2019-07-07 MED ORDER — ARIPIPRAZOLE 5 MG PO TABS
7.5000 mg | ORAL_TABLET | Freq: Every day | ORAL | Status: DC
  Administered 2019-07-07 – 2019-07-08 (×2): 7.5 mg via ORAL
  Filled 2019-07-07 (×2): qty 2

## 2019-07-07 MED ORDER — PENTAZOCINE-NALOXONE HCL 50-0.5 MG PO TABS
1.0000 | ORAL_TABLET | Freq: Four times a day (QID) | ORAL | Status: DC | PRN
  Administered 2019-07-07: 1 via ORAL
  Filled 2019-07-07: qty 1

## 2019-07-07 MED ORDER — TAMSULOSIN HCL 0.4 MG PO CAPS
0.4000 mg | ORAL_CAPSULE | Freq: Two times a day (BID) | ORAL | Status: DC
  Administered 2019-07-07 – 2019-07-08 (×2): 0.4 mg via ORAL
  Filled 2019-07-07 (×2): qty 1

## 2019-07-07 MED ORDER — DIAZEPAM 5 MG PO TABS
5.0000 mg | ORAL_TABLET | Freq: Every evening | ORAL | Status: DC | PRN
  Administered 2019-07-07: 5 mg via ORAL
  Filled 2019-07-07: qty 1

## 2019-07-07 MED ORDER — ASPIRIN EC 81 MG PO TBEC
81.0000 mg | DELAYED_RELEASE_TABLET | Freq: Every day | ORAL | Status: DC
  Administered 2019-07-07 – 2019-07-08 (×2): 81 mg via ORAL
  Filled 2019-07-07 (×2): qty 1

## 2019-07-07 MED ORDER — METOPROLOL SUCCINATE ER 25 MG PO TB24
25.0000 mg | ORAL_TABLET | Freq: Every day | ORAL | Status: DC
  Administered 2019-07-07 – 2019-07-08 (×2): 25 mg via ORAL
  Filled 2019-07-07 (×2): qty 1

## 2019-07-07 MED ORDER — VANCOMYCIN HCL IN DEXTROSE 1-5 GM/200ML-% IV SOLN
1000.0000 mg | Freq: Two times a day (BID) | INTRAVENOUS | Status: DC
  Administered 2019-07-07 – 2019-07-08 (×2): 1000 mg via INTRAVENOUS
  Filled 2019-07-07 (×4): qty 200

## 2019-07-07 MED ORDER — GABAPENTIN 100 MG PO CAPS
100.0000 mg | ORAL_CAPSULE | Freq: Three times a day (TID) | ORAL | Status: DC
  Administered 2019-07-07 – 2019-07-08 (×2): 100 mg via ORAL
  Filled 2019-07-07 (×2): qty 1

## 2019-07-07 MED ORDER — PRAVASTATIN SODIUM 10 MG PO TABS
10.0000 mg | ORAL_TABLET | Freq: Every day | ORAL | Status: DC
  Administered 2019-07-07 – 2019-07-08 (×2): 10 mg via ORAL
  Filled 2019-07-07 (×2): qty 1

## 2019-07-07 NOTE — Consult Note (Signed)
[facility] Consult  Referring Provider: Dr. Doristine Bosworth Primary Care Physician:  [name], MD Primary Gastroenterologist: Althia Forts  Reason for Consultation: Diarrhea  HPI: Dean Cole is a 69 y.o. male with past medical history of prostate cancer, DM type II, PAD, CAD (s/p CABG), HTN, hearing loss and recent PNA currently hospitalized for sepsis of unknown origin presenting for consult of persistent diarrhea.    Patient is unable to state when the diarrhea started but believes that coincided with hospital clear liquid diet.  However, on chart review, it appears that he saw his PCP on 05/26/2019 for diarrhea that is reported to have been persisting for 1.5 weeks at that time. He reports having approximately 10-12 loose bowel movements today.  He denies associated abdominal pain or cramping.  He denies hematochezia or melena.  He denies sick contacts.He reports 7-8 lbs weight loss.  He reports recent antibiotics on two occassions, though he is unable to recall the names of the antibiotics.  Per chart review, one of the antibiotics was doxycycline x7 days.  He endorses intermittent belching and heartburn.  He states he had some recent weight loss, which he attributes to dehydration.  He had nausea and vomiting during this hospital stay, but currently denies any nausea or vomiting.  Last colonoscopy on file was January 2005.  Patient states that he has not had a colonoscopy for approximately 20 years.  Creatinine 1.08 in November 2020, 1.05 on 06/13/2019, and elevated to 1.42 on arrival yesterday.  Currently trending down and is now at 1.02.  CT of abdomen yesterday showed circumferential wall thickening involving the sigmoid colon extending with patulous distension of the upstream colon, though not resulting in enteric obstruction. Findings are nonspecific though could be seen in the setting of an enteritis.    Past Medical History:  Diagnosis Date  . Anxiety   . Cancer  ([place])    skin cancer  basel cell carcinoma  . Cholecystitis   . Controlled type 2 DM with peripheral circulatory disorder ([place]) 09/20/2014  . Coronary artery disease   . Depression   . Headache(784.0)   . HTN (hypertension)   . Hx of skin cancer, basal cell   . Hyperlipidemia   . Hypothyroidism   . PAD (peripheral artery disease) ([place]) 09/20/2014  . Prostate cancer ([place])   . Restless leg syndrome   . Skin cancer    melanoma    Past Surgical History:  Procedure Laterality Date  . BASAL CELL CARCINOMA EXCISION  2000   Dr,Arkin   . CARDIAC CATHETERIZATION  07/07/2011  . CHOLECYSTECTOMY  09/14/2011   Procedure: LAPAROSCOPIC CHOLECYSTECTOMY WITH INTRAOPERATIVE CHOLANGIOGRAM;  Surgeon: Gwenyth Ober, MD;  Location: [facility];  Service: General;  Laterality: N/A;  . CORONARY ARTERY BYPASS GRAFT  07/10/2011   Procedure: CORONARY ARTERY BYPASS GRAFTING (CABG);  Surgeon: Ivin Poot, MD;  Location: [facility];  Service: Open Heart Surgery;  Laterality: N/A;  . EXCISIONAL HEMORRHOIDECTOMY  1997  . HEMORROIDECTOMY  2000  . LEFT HEART CATHETERIZATION WITH CORONARY ANGIOGRAM N/A 07/07/2011   Procedure: LEFT HEART CATHETERIZATION WITH CORONARY ANGIOGRAM;  Surgeon: Burnell Blanks, MD;  Location: [facility] CATH LAB;  Service: Cardiovascular;  Laterality: N/A;    Prior to Admission medications   Medication Sig Start Date End Date Taking? Authorizing Provider  ARIPiprazole (ABILIFY) 5 MG tablet Take 7.5 mg by mouth in the morning and at bedtime.   Yes [provider]  aspirin EC 81 MG tablet Take 1  tablet (81 mg total) by mouth daily. 12/16/18  Yes Nahser, Wonda Cheng, MD  Cholecalciferol (VITAMIN D3 PO) Take 2 tablets by mouth daily.   Yes [provider]  imipramine (TOFRANIL) 25 MG tablet Take 75-100 mg by mouth 2 (two) times daily.  12/23/18  Yes [provider]  levothyroxine (SYNTHROID) 150 MCG tablet Take 150 mcg by mouth daily.  06/17/18  Yes [provider]  metFORMIN  (GLUCOPHAGE) 500 MG tablet Take 1 tablet (500 mg total) by mouth 2 (two) times daily with a meal. 08/12/15  Yes Mast, Man X, NP  metoprolol succinate (TOPROL-XL) 25 MG 24 hr tablet Take 25 mg by mouth in the morning and at bedtime.  07/17/18  Yes [provider]  MYRBETRIQ 25 MG TB24 tablet TAKE 1 TABLET BY MOUTH EVERY DAY Patient taking differently: Take 25 mg by mouth daily.  04/27/19  Yes Bruning, Ashlyn, PA-C  NITROSTAT 0.4 MG SL tablet Place 0.4 mg under the tongue every 5 (five) minutes as needed for chest pain. Reported on 10/26/2015 07/06/11  Yes [provider]  Nutritional Supplements (ENSURE PO) Take 1 Bottle by mouth in the morning, at noon, and at bedtime.   Yes [provider]  pentazocine-naloxone (TALWIN NX) 50-0.5 MG tablet Take 1 tablet by mouth every 4 (four) hours as needed for moderate pain.   Yes [provider]  pravastatin (PRAVACHOL) 10 MG tablet Take 1 tablet by mouth daily. 06/24/19  Yes [provider]  simvastatin (ZOCOR) 20 MG tablet TAKE ONE TABLET BY MOUTH ONCE DAILY FOR CHOLESTEROL Patient taking differently: Take 20 mg by mouth daily. Take one tablet by mouth once daily for cholesterol 10/16/16  Yes Reed, Tiffany L, DO  tamsulosin (FLOMAX) 0.4 MG CAPS capsule Take 0.4 mg by mouth 2 (two) times daily.   Yes [provider]  clotrimazole-betamethasone (LOTRISONE) cream APPLY TO AFFECTED AREA TWICE A DAY Patient not taking: Reported on 07/06/2019 04/28/19   Trula Slade, DPM  diazepam (VALIUM) 5 MG tablet TAKE 1 TABLET BY MOUTH FOUR TIMES A DAY AS NEEDED FOR NERVES Patient not taking: Reported on 07/06/2019 02/21/17   Hollace Kinnier L, DO  doxycycline (VIBRAMYCIN) 100 MG capsule Take 1 capsule (100 mg total) by mouth 2 (two) times daily. One po bid x 7 days Patient not taking: Reported on 07/06/2019 06/13/19   Elnora Morrison, MD  gabapentin (NEURONTIN) 100 MG capsule Take 1 capsule (100 mg total) by mouth 3 (three) times  daily. Patient not taking: Reported on 07/06/2019 02/21/19   Trula Slade, DPM  mupirocin ointment (BACTROBAN) 2 % Apply 1 application topically 2 (two) times daily. Patient not taking: Reported on 07/06/2019 01/10/19   Trula Slade, DPM  phenazopyridine (PYRIDIUM) 200 MG tablet Take 1 tablet (200 mg total) by mouth 3 (three) times daily as needed for pain. Patient not taking: Reported on 07/06/2019 11/29/18   Tyler Pita, MD    Current Facility-Administered Medications  Medication Dose Route Frequency Provider Last Rate Last Admin  . 0.9 %  sodium chloride infusion   Intravenous Continuous Eulogio Bear U, DO 100 mL/hr at 07/06/19 1653 Restarted at 07/06/19 1653  . ceFEPIme (MAXIPIME) 2 g in sodium chloride 0.9 % 100 mL IVPB  2 g Intravenous Q8H Vann, Jessica U, DO 200 mL/hr at 07/07/19 1120 2 g at 07/07/19 1120  . diazepam (VALIUM) tablet 5 mg  5 mg Oral QHS PRN Radene Gunning, NP      .  heparin injection 5,000 Units  5,000 Units Subcutaneous Q8H Vann, Jessica U, DO      . insulin aspart (novoLOG) injection 0-5 Units  0-5 Units Subcutaneous QHS Vann, Jessica U, DO      . insulin aspart (novoLOG) injection 0-9 Units  0-9 Units Subcutaneous TID WC Vann, Jessica U, DO   2 Units at 07/07/19 1321  . levothyroxine (SYNTHROID) tablet 125 mcg  125 mcg Oral Q0600 Geradine Girt, DO   125 mcg at 07/07/19 [id number]  . ondansetron (ZOFRAN) tablet 4 mg  4 mg Oral Q6H PRN Eulogio Bear U, DO       Or  . ondansetron (ZOFRAN) injection 4 mg  4 mg Intravenous Q6H PRN Vann, Jessica U, DO      . pentazocine-naloxone (TALWIN NX) 50-0.5 MG per tablet 1 tablet  1 tablet Oral Q6H PRN Radene Gunning, NP      . vancomycin (VANCOCIN) IVPB 1000 mg/200 mL premix  1,000 mg Intravenous Q12H Dang, Thuy D, RPH        Allergies as of 07/06/2019 - Review Complete 07/06/2019  Allergen Reaction Noted  . Eggs or egg-derived products Other (See Comments) 03/04/2014  . Adhesive [tape] Swelling 04/01/2012    Family  History  Problem Relation Age of Onset  . Coronary artery disease Father   . Prostate cancer Father   . Leukemia Mother   . Suicidality Brother   . Coronary artery disease Paternal Grandfather   . Breast cancer Neg Hx   . Colon cancer Neg Hx   . Pancreatic cancer Neg Hx     Social History   Socioeconomic History  . Marital status: Married    Spouse name: Not on file  . Number of children: 2  . Years of education: Not on file  . Highest education level: Not on file  Occupational History    Comment: retired  Tobacco Use  . Smoking status: Former Smoker    Packs/day: 1.00    Years: 21.00    Pack years: 21.00    Types: Cigarettes    Quit date: 08/08/1981    Years since quitting: 37.9  . Smokeless tobacco: Former Network engineer and Sexual Activity  . Alcohol use: No    Comment: former  . Drug use: No  . Sexual activity: Yes  Other Topics Concern  . Not on file  Social History Narrative  . Not on file   Social Determinants of Health   Financial Resource Strain:   . Difficulty of Paying Living Expenses: Not on file  Food Insecurity:   . Worried About Charity fundraiser in the Last Year: Not on file  . Ran Out of Food in the Last Year: Not on file  Transportation Needs:   . Lack of Transportation (Medical): Not on file  . Lack of Transportation (Non-Medical): Not on file  Physical Activity:   . Days of Exercise per Week: Not on file  . Minutes of Exercise per Session: Not on file  Stress:   . Feeling of Stress : Not on file  Social Connections:   . Frequency of Communication with Friends and Family: Not on file  . Frequency of Social Gatherings with Friends and Family: Not on file  . Attends Religious Services: Not on file  . Active Member of Clubs or Organizations: Not on file  . Attends Archivist Meetings: Not on file  . Marital Status: Not on file  Intimate Partner Violence:   .  Fear of Current or Ex-Partner: Not on file  . Emotionally Abused:  Not on file  . Physically Abused: Not on file  . Sexually Abused: Not on file    Review of Systems: As noted in HPI.  Physical Exam: Vital signs in last 24 hours: Temp:  [98.2 F (36.8 C)-99 F (37.2 C)] 98.2 F (36.8 C) (03/08 0836) Pulse Rate:  [85-97] 92 (03/08 0836) Resp:  [16-25] 16 (03/08 0836) BP: (110-148)/(65-74) 148/70 (03/08 0836) SpO2:  [96 %-100 %] 97 % (03/08 0836) Last BM Date: 07/06/19  General:   Alert,  Well-developed, well-nourished, pleasant and cooperative in NAD Head:  Normocephalic and atraumatic. Eyes:  Sclera clear, no icterus.    Ears: Hearing loss. Nose:  No deformity, discharge,  or lesions. Lungs:  Clear throughout to auscultation.   No wheezes, crackles, or rhonchi. No acute distress. Heart:  Regular rate and rhythm; no murmurs, clicks, rubs,  or gallops. Extremities:  Without edema. Neurologic:  Alert and oriented;  grossly normal neurologically. Skin:  Intact without significant lesions or rashes. Psych:  Alert and cooperative. Normal mood and affect. Abdomen:  Soft, nontender and nondistended. No masses noted. Normal bowel sounds, without guarding, and without rebound.       Lab Results: Recent Labs    07/06/19 0630 07/06/19 0727 07/07/19 0249  WBC 5.5  --  3.8*  HGB 12.9* 13.6 10.3*  HCT 40.1 40.0 31.3*  PLT 95*  --  69*   BMET Recent Labs    07/06/19 0630 07/06/19 0727 07/07/19 0249  NA 137 136 140  K 3.9 4.1 3.5  CL 100 100 105  CO2 22  --  24  GLUCOSE 312* 270* 159*  BUN 17 19 11   CREATININE 1.42* 1.20 1.02  CALCIUM 8.3*  --  7.7*   LFT Recent Labs    07/06/19 0630  PROT 6.7  ALBUMIN 3.5  AST 60*  ALT 36  ALKPHOS 102  BILITOT 0.8  BILIDIR 0.2  IBILI 0.6   PT/INR Recent Labs    07/06/19 0630 07/07/19 0249  LABPROT 14.8 16.0*  INR 1.2 1.3*    Studies/Results: CT Head Wo Contrast  Result Date: 07/06/2019 CLINICAL DATA:  Altered mental status. EXAM: CT HEAD WITHOUT CONTRAST TECHNIQUE: Contiguous axial  images were obtained from the base of the skull through the vertex without intravenous contrast. COMPARISON:  None. FINDINGS: Brain: Atrophy with sulcal prominence and centralized atrophy with commensurate ex vacuo dilatation of the ventricular system. Scattered periventricular hypodensities compatible with microvascular ischemic disease. No CT evidence of acute large territory infarct. No intraparenchymal or extra-axial mass or hemorrhage. Normal configuration of the ventricles and the basilar cisterns. No midline shift. Vascular: Intracranial atherosclerosis. Skull: No displaced calvarial fracture. Sinuses/Orbits: There is underpneumatization the bilateral frontal sinuses. The remaining paranasal sinuses and mastoid air cells is normal. No air-fluid levels. Other: Regional soft tissues appear normal. IMPRESSION: Mild microvascular ischemic disease and advanced atrophy without superimposed acute intracranial process. Electronically Signed   By: Sandi Mariscal M.D.   On: 07/06/2019 08:33   CT Chest W Contrast  Result Date: 07/06/2019 CLINICAL DATA:  Multiple falls over the past several months. History of prostate cancer. EXAM: CT CHEST, ABDOMEN, AND PELVIS WITH CONTRAST TECHNIQUE: Multidetector CT imaging of the chest, abdomen and pelvis was performed following the standard protocol during bolus administration of intravenous contrast. CONTRAST:  177mL OMNIPAQUE IOHEXOL 300 MG/ML  SOLN COMPARISON:  CT abdomen pelvis-06/26/2018; 09/11/2011 FINDINGS: CT CHEST FINDINGS Cardiovascular: Normal heart  size. Post median sternotomy and CABG with calcifications within the native coronary arteries. No pericardial effusion. Normal caliber of the thoracic aorta. No evidence of thoracic aortic dissection or periaortic stranding on this nongated examination. The left vertebral artery is incidentally noted to arise directly from the aortic arch. The branch vessels of the aortic arch appear patent throughout their imaged courses.  Although this examination was not tailored for the evaluation the pulmonary arteries, there are no discrete filling defects within the central pulmonary arterial tree to suggest central pulmonary embolism. Normal caliber the main pulmonary artery. Mediastinum/Nodes: No bulky mediastinal, hilar or axillary lymphadenopathy. Lungs/Pleura: There is a punctate (approximate 0.5 cm) subpleural nodule within right upper lobe (image 46, series 7 as well as a smaller (approximately 0.3 cm) subpleural nodule within the left upper lobe (image 59, series 7). Subsegmental atelectasis within the bilateral lower lobes, left greater than right as well as about the anterior aspect of the right minor fissure and within the lingula. No discrete focal airspace opacities. No pleural effusion or pneumothorax. The central pulmonary airways appear widely patent. Musculoskeletal: No acute or aggressive osseous abnormalities. Mild symmetric gynecomastia. Regional soft tissues appear otherwise normal. The thyroid gland appears atrophic. _________________________________________________________ CT ABDOMEN PELVIS FINDINGS Hepatobiliary: There is diffuse decreased attenuation of the hepatic parenchyma on this postcontrast examination suggestive of hepatic steatosis. Punctate (subcentimeter) hypoattenuating lesions within the right lobe of the liver (image 62, series 6) as well as the caudate (image 62, series 6), are too small to adequately characterize though unchanged compared to the 06/2018 examination and favored to represent hepatic cysts. Post cholecystectomy. No intra or extrahepatic biliary ductal dilatation. No ascites. Pancreas: Normal appearance of the pancreas. Spleen: Normal appearance of the spleen. Adrenals/Urinary Tract: There is symmetric enhancement and excretion of the bilateral kidneys. Solitary punctate (2 mm) nonobstructing renal stones are seen bilaterally (right coronal image 104, series 8; left coronal image 87, series  8). No discrete renal lesions. There is a minimal amount of grossly symmetric bilateral perinephric stranding. No urine obstruction. There is mild thickening of the left adrenal gland without discrete nodule. Normal appearance of the right adrenal gland. There is a approximately 0.6 cm stone lying dependently within the urinary bladder (image 116, series 6, similar to the 06/26/2018 examination. Mild thickening of the urinary bladder wall. Stomach/Bowel: There is circumferential wall thickening involving the sigmoid colon to the level of the rectum (representative images 102 and 107, series 6), with associated minimal amount of fluid in the pelvic cul-de-sac but without definable/drainable fluid collection (sagittal image 99, series 9). This finding is associated with mild patulous distension of the upstream colon, not definitely resulting in enteric obstruction. Circumferential wall thickening involving several loops of mid jejunum (representative image 52 and 63, series 6), similar to the 06/2018 examination though new compared to the 08/2011 examination. Normal appearance of the terminal ileum and the retrocecal appendix. No pneumoperitoneum, pneumatosis or portal venous gas. Vascular/Lymphatic: There is a moderate to large amount of eccentric mixed calcified and noncalcified atherosclerotic plaque throughout the normal caliber abdominal aorta. Moderate to large amount calcified atherosclerotic plaque involving the right common iliac artery approaches 50% luminal narrowing (image 93, series 6, similar to the 2013 examination. No bulky mediastinal, hilar or axillary lymphadenopathy. 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 Other: There is a minimal amount of ill-defined subcutaneous edema about the left flank. Regional soft tissues appear otherwise normal. Musculoskeletal:  No acute or aggressive osseous abnormalities  within the abdomen or pelvis. Mild multilevel lumbar spine DDD, worse at L3-L4 and L4-L5 with disc space height loss, endplate irregularity and sclerosis. IMPRESSION: Chest CT impression: 1. No acute cardiopulmonary disease. 2. Post median sternotomy and CABG. Calcifications within native coronary arteries. Aortic Atherosclerosis (ICD10-I70.0). 3. Bilateral punctate pulmonary nodules, largest of which within the right upper lobe measures 0.5 cm in diameter. No follow-up needed if patient is low-risk (and has no known or suspected primary neoplasm). Non-contrast chest CT can be considered in 12 months if patient is high-risk. This recommendation follows the consensus statement: Guidelines for Management of Incidental Pulmonary Nodules Detected on CT Images: From the Fleischner Society 2017; Radiology 2017; 284:228-243. Abdomen and pelvic CT impression: 1. Apparent circumferential wall thickening involving the sigmoid colon extending with patulous distension of the upstream colon, though not resulting in enteric obstruction. Findings are nonspecific though could be seen in the setting of an enteritis. Further evaluation with colonoscopy could be performed as indicated. 2. Mild circumferential wall thickening involving several loops of mid small bowel, not resulting in enteric obstruction, similar to the 06/2018 examination while potentially secondary to peristalsis though additional location of an enteritis could have a similar appearance. 3. Redemonstrated 0.6 cm urinary bladder calculus, similar to the 06/2018 examination. 4. Redemonstrated solitary punctate (2 mm) nonobstructing bilateral renal stones. 5. Suspected hepatic steatosis.  Correlation with LFTs is advised. Electronically Signed   By: Sandi Mariscal M.D.   On: 07/06/2019 09:02   CT Cervical Spine Wo Contrast  Result Date: 07/06/2019 CLINICAL DATA:  Dizziness, multiple falls, history of prostate cancer EXAM: CT CERVICAL SPINE WITHOUT CONTRAST TECHNIQUE:  Multidetector CT imaging of the cervical spine was performed without intravenous contrast. Multiplanar CT image reconstructions were also generated. COMPARISON:  None. FINDINGS: Alignment: Anteroposterior alignment is maintained. Skull base and vertebrae: Vertebral body heights are preserved. There is no suspicious osseous lesion. No acute fracture. Soft tissues and spinal canal: No prevertebral fluid or swelling. No visible canal hematoma. Disc levels:  There is no significant degenerative stenosis. Upper chest: Negative. Other: None. IMPRESSION: No significant or acute osseous abnormality. Electronically Signed   By: Macy Mis M.D.   On: 07/06/2019 08:45   CT ABDOMEN PELVIS W CONTRAST  Result Date: 07/06/2019 CLINICAL DATA:  Multiple falls over the past several months. History of prostate cancer. EXAM: CT CHEST, ABDOMEN, AND PELVIS WITH CONTRAST TECHNIQUE: Multidetector CT imaging of the chest, abdomen and pelvis was performed following the standard protocol during bolus administration of intravenous contrast. CONTRAST:  144mL OMNIPAQUE IOHEXOL 300 MG/ML  SOLN COMPARISON:  CT abdomen pelvis-06/26/2018; 09/11/2011 FINDINGS: CT CHEST FINDINGS Cardiovascular: Normal heart size. Post median sternotomy and CABG with calcifications within the native coronary arteries. No pericardial effusion. Normal caliber of the thoracic aorta. No evidence of thoracic aortic dissection or periaortic stranding on this nongated examination. The left vertebral artery is incidentally noted to arise directly from the aortic arch. The branch vessels of the aortic arch appear patent throughout their imaged courses. Although this examination was not tailored for the evaluation the pulmonary arteries, there are no discrete filling defects within the central pulmonary arterial tree to suggest central pulmonary embolism. Normal caliber the main pulmonary artery. Mediastinum/Nodes: No bulky mediastinal, hilar or axillary lymphadenopathy.  Lungs/Pleura: There is a punctate (approximate 0.5 cm) subpleural nodule within right upper lobe (image 46, series 7 as well as a smaller (approximately 0.3 cm) subpleural nodule within the left upper lobe (image  59, series 7). Subsegmental atelectasis within the bilateral lower lobes, left greater than right as well as about the anterior aspect of the right minor fissure and within the lingula. No discrete focal airspace opacities. No pleural effusion or pneumothorax. The central pulmonary airways appear widely patent. Musculoskeletal: No acute or aggressive osseous abnormalities. Mild symmetric gynecomastia. Regional soft tissues appear otherwise normal. The thyroid gland appears atrophic. _________________________________________________________ CT ABDOMEN PELVIS FINDINGS Hepatobiliary: There is diffuse decreased attenuation of the hepatic parenchyma on this postcontrast examination suggestive of hepatic steatosis. Punctate (subcentimeter) hypoattenuating lesions within the right lobe of the liver (image 62, series 6) as well as the caudate (image 62, series 6), are too small to adequately characterize though unchanged compared to the 06/2018 examination and favored to represent hepatic cysts. Post cholecystectomy. No intra or extrahepatic biliary ductal dilatation. No ascites. Pancreas: Normal appearance of the pancreas. Spleen: Normal appearance of the spleen. Adrenals/Urinary Tract: There is symmetric enhancement and excretion of the bilateral kidneys. Solitary punctate (2 mm) nonobstructing renal stones are seen bilaterally (right coronal image 104, series 8; left coronal image 87, series 8). No discrete renal lesions. There is a minimal amount of grossly symmetric bilateral perinephric stranding. No urine obstruction. There is mild thickening of the left adrenal gland without discrete nodule. Normal appearance of the right adrenal gland. There is a approximately 0.6 cm stone lying dependently within the  urinary bladder (image 116, series 6, similar to the 06/26/2018 examination. Mild thickening of the urinary bladder wall. Stomach/Bowel: There is circumferential wall thickening involving the sigmoid colon to the level of the rectum (representative images 102 and 107, series 6), with associated minimal amount of fluid in the pelvic cul-de-sac but without definable/drainable fluid collection (sagittal image 99, series 9). This finding is associated with mild patulous distension of the upstream colon, not definitely resulting in enteric obstruction. Circumferential wall thickening involving several loops of mid jejunum (representative image 52 and 63, series 6), similar to the 06/2018 examination though new compared to the 08/2011 examination. Normal appearance of the terminal ileum and the retrocecal appendix. No pneumoperitoneum, pneumatosis or portal venous gas. Vascular/Lymphatic: There is a moderate to large amount of eccentric mixed calcified and noncalcified atherosclerotic plaque throughout the normal caliber abdominal aorta. Moderate to large amount calcified atherosclerotic plaque involving the right common iliac artery approaches 50% luminal narrowing (image 93, series 6, similar to the 2013 examination. No bulky mediastinal, hilar or axillary lymphadenopathy. 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 Other: There is a minimal amount of ill-defined subcutaneous edema about the left flank. Regional soft tissues appear otherwise normal. Musculoskeletal: No acute or aggressive osseous abnormalities within the abdomen or pelvis. Mild multilevel lumbar spine DDD, worse at L3-L4 and L4-L5 with disc space height loss, endplate irregularity and sclerosis. IMPRESSION: Chest CT impression: 1. No acute cardiopulmonary disease. 2. Post median sternotomy and CABG. Calcifications within native coronary arteries. Aortic  Atherosclerosis (ICD10-I70.0). 3. Bilateral punctate pulmonary nodules, largest of which within the right upper lobe measures 0.5 cm in diameter. No follow-up needed if patient is low-risk (and has no known or suspected primary neoplasm). Non-contrast chest CT can be considered in 12 months if patient is high-risk. This recommendation follows the consensus statement: Guidelines for Management of Incidental Pulmonary Nodules Detected on CT Images: From the Fleischner Society 2017; Radiology 2017; 284:228-243. Abdomen and pelvic CT impression: 1. Apparent circumferential wall thickening involving the sigmoid colon extending  with patulous distension of the upstream colon, though not resulting in enteric obstruction. Findings are nonspecific though could be seen in the setting of an enteritis. Further evaluation with colonoscopy could be performed as indicated. 2. Mild circumferential wall thickening involving several loops of mid small bowel, not resulting in enteric obstruction, similar to the 06/2018 examination while potentially secondary to peristalsis though additional location of an enteritis could have a similar appearance. 3. Redemonstrated 0.6 cm urinary bladder calculus, similar to the 06/2018 examination. 4. Redemonstrated solitary punctate (2 mm) nonobstructing bilateral renal stones. 5. Suspected hepatic steatosis.  Correlation with LFTs is advised. Electronically Signed   By: Sandi Mariscal M.D.   On: 07/06/2019 09:02   DG Chest Portable 1 View  Result Date: 07/06/2019 CLINICAL DATA:  69 year old male with history of trauma from a fall. Evaluate for potential pneumonia. EXAM: PORTABLE CHEST 1 VIEW COMPARISON:  Chest x-ray 06/13/2019. FINDINGS: Lung volumes are low. Linear opacities in the left base may reflect areas of subsegmental atelectasis or scarring. No consolidative airspace disease. No pleural effusions. No pneumothorax. No pulmonary nodule or mass noted. Pulmonary vasculature and the  cardiomediastinal silhouette are within normal limits. Status post median sternotomy for CABG. IMPRESSION: 1. Low lung volumes with probable subsegmental atelectasis in the left base. 2. Postoperative changes, as above. Electronically Signed   By: Vinnie Langton M.D.   On: 07/06/2019 07:30   DG Toe Great Left  Result Date: 07/06/2019 CLINICAL DATA:  Pain and swelling involving the LEFT great toe. No known injuries. EXAM: LEFT GREAT TOE-3 VIEWS COMPARISON:  LEFT foot x-rays 12/31/2018. FINDINGS: No evidence of acute fracture or dislocation. Joint spaces well preserved. Well-preserved bone mineral density. No intrinsic osseous abnormalities. IMPRESSION: Normal examination. Electronically Signed   By: Evangeline Dakin M.D.   On: 07/06/2019 10:43    Impression: Diarrhea persisting since January 2021.  Unknown etiology.  C. difficile negative. Recent use of doxycycline Normal renal function, normal WBC count, mild normocytic anemia, thrombocytopenia(95 on admission, 69 today)  Plan: 1.  Await GI pathogen panel. 2.  Continue IVF hydration. 3.  If GI pathogen panel is negative, recommend colonoscopy. Discussed the possibility of colonoscopy with patient and he stated "no, I am not doing that," further stating he has "a lot going on right now."  We will readdress this once GI pathogen panel results are back.    LOS: 0 days   Ronnette Juniper, MD  07/07/2019, 2:38 PM

## 2019-07-07 NOTE — Progress Notes (Signed)
Progress Note    Dean Cole  OZY:[id number] DOB: 1950/11/29  DOA: 07/06/2019 PCP: [name], MD    Brief Narrative:   Chief complaint: diarrhea  Medical records reviewed and are as summarized below:  Dean Cole is an irritable and very HOH 69 y.o. male with a past medical history that includes prostate cancer, diabetes type 2 hypertension, hyperlipidemia admitted with chief complaint persistent diarrhea and altered mental status.  Work-up in the emergency department revealed patient met criteria for sepsis from unknown source.  He was reportedly febrile hypotensive tachycardic with tachypnea and an elevated lactic acid.  Assessment/Plan:   Principal Problem:   Sepsis ([place]) Active Problems:   Acquired deformity of toenail   N&V (nausea and vomiting)   Fever   Acute kidney failure (HCC)   Postural dizziness   Controlled type 2 DM with peripheral circulatory disorder (HCC)   Lung nodule   AMS (altered mental status)   Hypocalcemia   Hypothyroidism   Hearing loss  #1.  Sepsis.  Source unclear.  Urine with ketones and protein no signs of infection, chest x-ray with low lung volumes with probable subsegmental atelectasis in the left base, x-ray of his left great toe no evidence of acute fracture or dislocation.  He is afebrile, tachycardia and tachypnea have resolved with IV fluids.  Lactic acid trending down.  Has been receiving Vanc and cefepime.  No leukocytosis.  CT of the abdomen shows wall thickening involving sigmoid colon and mild thickening involving several loops of mid small bowel not resulting in enteric obstruction.  Findings nonspecific though could be setting of enteritis.  LFTs within the limits of normal.  Continues with multiple watery stools.  Was on antibiotics last month for "pneumonia" -Continue gentle IV fluids -Continue IV antibiotics -Clear liquids -C. Difficile -GI pathogen panel -GI consult  #2.  Nausea/vomiting/diarrhea.  Nausea and  vomiting are improved but continues with frequent watery stools.  See #1.  He reports he was on an antibiotic "last month for pneumonia".  Not know the name of the antibiotic. -Gentle IV fluids -Clear liquids -GI pathogen panel -C. difficile PCR -As needed Zofran -GI consult  #3.  Acute kidney injury.  Likely related to above.  Creatinine trending down this morning.  Chart review indicates baseline around 1.  On admission creatinine 1.4. -Gentle IV fluids -Hold nephrotoxins -Monitor intake and output -Recheck in the morning  #4.  Postural dizziness.  Clear related to dehydration/volume depletion in the setting of persistent diarrhea and decreased oral intake. -Await orthostatics -Continue IV fluids -See above  #5.  Altered mental status.  Wife reports intermittent confusion worsening over the last 2 days.  On exam patient is alert and oriented x3.  Folate B12 RPR within the limits of normal -Monitor  #6.  Diabetes type 2.  Hemoglobin A1c greater than 7.  Serum glucose 312 on admission.  Home medications include Metformin. -Hold Metformin -Sliding scale insulin for optimal control -Monitor  #7.  Hypocalcemia -Repleted  #8.  Hypothyroidism.  TSH greater than 6.  Free T4 within the limits of normal -Synthroid dose increased on admission and will continue  #9.  Anxiety.  Chart review indicates 2 years ago patient was taken Valium 4 times a day.  Had a propensity for panic attacks.  Currently at baseline.  Home medications include Valium at bedtime.  #10.  Acquired deformity of the toe.  Left great toe with erythema swelling.  Patient reports he has been "  picking at it with a knife."  X-ray no acute abnormality -Monitor -Antibiotics as noted above  Family Communication/Anticipated D/C date and plan/Code Status   DVT prophylaxis: Lovenox ordered. Code Status: Full Code.  Family Communication: wife at bedside with MD Disposition Plan: home  Medical Consultants:     GI   Anti-Infectives:    Vancomycin 3/7>>  Cefepime 3/7>>  Subjective:   Lying in bed quite irritable.  Complains of pain "all over".  Complains of continued watery stool "at least 6 times during the night".  Stated that he is not able to get up on his own.  Denies chest pain palpitations dizziness.  Complains that his home medications were not resumed  Objective:    Vitals:   07/06/19 1500 07/06/19 1600 07/06/19 2331 07/07/19 0836  BP: 110/67 134/65 138/74 (!) 148/70  Pulse: 89 85 97 92  Resp: (!) _0 Temp:  98.4 F (36.9 C) 99 F (37.2 C) 98.2 F (36.8 C)  TempSrc:  Oral    SpO2: 98% 100% 96% 97%  Weight:      Height:        Intake/Output Summary (Last 24 hours) at 07/07/2019 1233 Last data filed at 07/06/2019 1700 Gross per 24 hour  Intake 2750.37 ml  Output --  Net 2750.37 ml   Filed Weights   07/06/19 5320 07/06/19 1000  Weight: 84.8 kg 84.8 kg    Exam: General: Awake alert well-nourished no acute distress CV: Regular rate and rhythm no murmur gallop or rub no lower extremity edema pedal pulses present palpable Respiratory: No increased work of breathing breath sounds are clear somewhat distant I hear no wheeze no crackles Abdomen: Soft positive bowel sounds throughout nontender to palpation no guarding or rebounding Musculoskeletal: Joints without swelling/erythema left great toe with erythema and swelling slight discoloration of the nail bed. Neuro: Alert and oriented x3 speech clear facial symmetry somewhat irritable  Data Reviewed:   I have personally reviewed following labs and imaging studies:  Labs: Labs show the following:   Basic Metabolic Panel: Recent Labs  Lab 07/06/19 0630 07/06/19 0630 07/06/19 0727 07/07/19 0249  NA 137  --  136 140  K 3.9   < > 4.1 3.5  CL 100  --  100 105  CO2 22  --   --  24  GLUCOSE 312*  --  270* 159*  BUN 17  --  19 11  CREATININE 1.42*  --  1.20 1.02  CALCIUM 8.3*  --   --  7.7*   < > =  values in this interval not displayed.   GFR Estimated Creatinine Clearance: 71.6 mL/min (by C-G formula based on SCr of 1.02 mg/dL). Liver Function Tests: Recent Labs  Lab 07/06/19 0630  AST 60*  ALT 36  ALKPHOS 102  BILITOT 0.8  PROT 6.7  ALBUMIN 3.5   Recent Labs  Lab 07/06/19 0630  LIPASE 25   Recent Labs  Lab 07/06/19 0630  AMMONIA 39*   Coagulation profile Recent Labs  Lab 07/06/19 0630 07/07/19 0249  INR 1.2 1.3*    CBC: Recent Labs  Lab 07/06/19 0630 07/06/19 0727 07/07/19 0249  WBC 5.5  --  3.8*  NEUTROABS 4.4  --   --   HGB 12.9* 13.6 10.3*  HCT 40.1 40.0 31.3*  MCV 91.3  --  90.5  PLT 95*  --  69*   Cardiac Enzymes: No results for input(s): CKTOTAL, CKMB, CKMBINDEX, TROPONINI in the last  168 hours. BNP (last 3 results) No results for input(s): PROBNP in the last 8760 hours. CBG: Recent Labs  Lab 07/06/19 1222 07/06/19 1633 07/06/19 2112 07/07/19 0833 07/07/19 1201  GLUCAP 136* 137* 112* 148* 172*   D-Dimer: No results for input(s): DDIMER in the last 72 hours. Hgb A1c: Recent Labs    07/06/19 1606  HGBA1C 7.2*   Lipid Profile: No results for input(s): CHOL, HDL, LDLCALC, TRIG, CHOLHDL, LDLDIRECT in the last 72 hours. Thyroid function studies: Recent Labs    07/06/19 0748  TSH 6.509*   Anemia work up: Recent Labs    07/07/19 0249  VITAMINB12 354   Sepsis Labs: Recent Labs  Lab 07/06/19 0630 07/06/19 0713 07/06/19 0934 07/06/19 1606 07/07/19 0249  PROCALCITON  --   --   --   --  4.08  WBC 5.5  --   --   --  3.8*  LATICACIDVEN  --  4.7* 3.2* 2.6*  --     Microbiology Recent Results (from the past 240 hour(s))  Urine culture     Status: None   Collection Time: 07/06/19  6:47 AM   Specimen: In/Out Cath Urine  Result Value Ref Range Status   Specimen Description IN/OUT CATH URINE  Final   Special Requests NONE  Final   Culture   Final    NO GROWTH Performed at [facility], [address],  [place], [place] [address]    Report Status 07/07/2019 FINAL  Final  Blood culture (routine x 2)     Status: None (Preliminary result)   Collection Time: 07/06/19  7:13 AM   Specimen: BLOOD  Result Value Ref Range Status   Specimen Description BLOOD RIGHT ANTECUBITAL  Final   Special Requests   Final    BOTTLES DRAWN AEROBIC AND ANAEROBIC Blood Culture adequate volume   Culture   Final    NO GROWTH < 24 HOURS Performed at [facility], [place] [address], [place], [place] [address]    Report Status PENDING  Incomplete  Blood culture (routine x 2)     Status: None (Preliminary result)   Collection Time: 07/06/19  7:24 AM   Specimen: BLOOD  Result Value Ref Range Status   Specimen Description BLOOD LEFT ANTECUBITAL  Final   Special Requests   Final    BOTTLES DRAWN AEROBIC AND ANAEROBIC Blood Culture results may not be optimal due to an inadequate volume of blood received in culture bottles   Culture   Final    NO GROWTH < 24 HOURS Performed at [facility], [place] [address] [place], [place] [address]    Report Status PENDING  Incomplete  SARS CORONAVIRUS 2 (TAT 6-24 HRS) Nasopharyngeal Nasopharyngeal Swab     Status: None   Collection Time: 07/06/19 10:11 AM   Specimen: Nasopharyngeal Swab  Result Value Ref Range Status   SARS Coronavirus 2 NEGATIVE NEGATIVE Final    Comment: (NOTE) SARS-CoV-2 target nucleic acids are NOT DETECTED. The SARS-CoV-2 RNA is generally detectable in upper and lower respiratory specimens during the acute phase of infection. Negative results do not preclude SARS-CoV-2 infection, do not rule out co-infections with other pathogens, and should not be used as the sole basis for treatment or other patient management decisions. Negative results must be combined with clinical observations, patient history, and epidemiological information. The expected result is Negative. Fact Sheet for Patients: SugarRoll.be Fact Sheet for  Healthcare Providers: https://www.woods-mathews.com/ This test is not yet approved  or cleared by the [place] and  has been authorized for detection and/or diagnosis of SARS-CoV-2 by FDA under an Emergency Use Authorization (EUA). This EUA will remain  in effect (meaning this test can be used) for the duration of the COVID-19 declaration under Section 56 4(b)(1) of the Act, 21 U.S.C. section 360bbb-3(b)(1), unless the authorization is terminated or revoked sooner. Performed at [facility], [place] [address], [place], [place] [address]     Procedures and diagnostic studies:  CT Head Wo Contrast  Result Date: 07/06/2019 CLINICAL DATA:  Altered mental status. EXAM: CT HEAD WITHOUT CONTRAST TECHNIQUE: Contiguous axial images were obtained from the base of the skull through the vertex without intravenous contrast. COMPARISON:  None. FINDINGS: Brain: Atrophy with sulcal prominence and centralized atrophy with commensurate ex vacuo dilatation of the ventricular system. Scattered periventricular hypodensities compatible with microvascular ischemic disease. No CT evidence of acute large territory infarct. No intraparenchymal or extra-axial mass or hemorrhage. Normal configuration of the ventricles and the basilar cisterns. No midline shift. Vascular: Intracranial atherosclerosis. Skull: No displaced calvarial fracture. Sinuses/Orbits: There is underpneumatization the bilateral frontal sinuses. The remaining paranasal sinuses and mastoid air cells is normal. No air-fluid levels. Other: Regional soft tissues appear normal. IMPRESSION: Mild microvascular ischemic disease and advanced atrophy without superimposed acute intracranial process. Electronically Signed   By: Sandi Mariscal M.D.   On: 07/06/2019 08:33   CT Chest W Contrast  Result Date: 07/06/2019 CLINICAL DATA:  Multiple falls over the past several months. History of prostate cancer. EXAM: CT CHEST, ABDOMEN, AND PELVIS WITH  CONTRAST TECHNIQUE: Multidetector CT imaging of the chest, abdomen and pelvis was performed following the standard protocol during bolus administration of intravenous contrast. CONTRAST:  144m OMNIPAQUE IOHEXOL 300 MG/ML  SOLN COMPARISON:  CT abdomen pelvis-06/26/2018; 09/11/2011 FINDINGS: CT CHEST FINDINGS Cardiovascular: Normal heart size. Post median sternotomy and CABG with calcifications within the native coronary arteries. No pericardial effusion. Normal caliber of the thoracic aorta. No evidence of thoracic aortic dissection or periaortic stranding on this nongated examination. The left vertebral artery is incidentally noted to arise directly from the aortic arch. The branch vessels of the aortic arch appear patent throughout their imaged courses. Although this examination was not tailored for the evaluation the pulmonary arteries, there are no discrete filling defects within the central pulmonary arterial tree to suggest central pulmonary embolism. Normal caliber the main pulmonary artery. Mediastinum/Nodes: No bulky mediastinal, hilar or axillary lymphadenopathy. Lungs/Pleura: There is a punctate (approximate 0.5 cm) subpleural nodule within right upper lobe (image 46, series 7 as well as a smaller (approximately 0.3 cm) subpleural nodule within the left upper lobe (image 59, series 7). Subsegmental atelectasis within the bilateral lower lobes, left greater than right as well as about the anterior aspect of the right minor fissure and within the lingula. No discrete focal airspace opacities. No pleural effusion or pneumothorax. The central pulmonary airways appear widely patent. Musculoskeletal: No acute or aggressive osseous abnormalities. Mild symmetric gynecomastia. Regional soft tissues appear otherwise normal. The thyroid gland appears atrophic. _________________________________________________________ CT ABDOMEN PELVIS FINDINGS Hepatobiliary: There is diffuse decreased attenuation of the hepatic  parenchyma on this postcontrast examination suggestive of hepatic steatosis. Punctate (subcentimeter) hypoattenuating lesions within the right lobe of the liver (image 62, series 6) as well as the caudate (image 62, series 6), are too small to adequately characterize though unchanged compared to the 06/2018 examination and favored to represent hepatic cysts. Post cholecystectomy. No intra or extrahepatic biliary  ductal dilatation. No ascites. Pancreas: Normal appearance of the pancreas. Spleen: Normal appearance of the spleen. Adrenals/Urinary Tract: There is symmetric enhancement and excretion of the bilateral kidneys. Solitary punctate (2 mm) nonobstructing renal stones are seen bilaterally (right coronal image 104, series 8; left coronal image 87, series 8). No discrete renal lesions. There is a minimal amount of grossly symmetric bilateral perinephric stranding. No urine obstruction. There is mild thickening of the left adrenal gland without discrete nodule. Normal appearance of the right adrenal gland. There is a approximately 0.6 cm stone lying dependently within the urinary bladder (image 116, series 6, similar to the 06/26/2018 examination. Mild thickening of the urinary bladder wall. Stomach/Bowel: There is circumferential wall thickening involving the sigmoid colon to the level of the rectum (representative images 102 and 107, series 6), with associated minimal amount of fluid in the pelvic cul-de-sac but without definable/drainable fluid collection (sagittal image 99, series 9). This finding is associated with mild patulous distension of the upstream colon, not definitely resulting in enteric obstruction. Circumferential wall thickening involving several loops of mid jejunum (representative image 52 and 63, series 6), similar to the 06/2018 examination though new compared to the 08/2011 examination. Normal appearance of the terminal ileum and the retrocecal appendix. No pneumoperitoneum, pneumatosis or  portal venous gas. Vascular/Lymphatic: There is a moderate to large amount of eccentric mixed calcified and noncalcified atherosclerotic plaque throughout the normal caliber abdominal aorta. Moderate to large amount calcified atherosclerotic plaque involving the right common iliac artery approaches 50% luminal narrowing (image 93, series 6, similar to the 2013 examination. No bulky mediastinal, hilar or axillary lymphadenopathy. 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 Other: There is a minimal amount of ill-defined subcutaneous edema about the left flank. Regional soft tissues appear otherwise normal. Musculoskeletal: No acute or aggressive osseous abnormalities within the abdomen or pelvis. Mild multilevel lumbar spine DDD, worse at L3-L4 and L4-L5 with disc space height loss, endplate irregularity and sclerosis. IMPRESSION: Chest CT impression: 1. No acute cardiopulmonary disease. 2. Post median sternotomy and CABG. Calcifications within native coronary arteries. Aortic Atherosclerosis (ICD10-I70.0). 3. Bilateral punctate pulmonary nodules, largest of which within the right upper lobe measures 0.5 cm in diameter. No follow-up needed if patient is low-risk (and has no known or suspected primary neoplasm). Non-contrast chest CT can be considered in 12 months if patient is high-risk. This recommendation follows the consensus statement: Guidelines for Management of Incidental Pulmonary Nodules Detected on CT Images: From the Fleischner Society 2017; Radiology 2017; 284:228-243. Abdomen and pelvic CT impression: 1. Apparent circumferential wall thickening involving the sigmoid colon extending with patulous distension of the upstream colon, though not resulting in enteric obstruction. Findings are nonspecific though could be seen in the setting of an enteritis. Further evaluation with colonoscopy could be performed as  indicated. 2. Mild circumferential wall thickening involving several loops of mid small bowel, not resulting in enteric obstruction, similar to the 06/2018 examination while potentially secondary to peristalsis though additional location of an enteritis could have a similar appearance. 3. Redemonstrated 0.6 cm urinary bladder calculus, similar to the 06/2018 examination. 4. Redemonstrated solitary punctate (2 mm) nonobstructing bilateral renal stones. 5. Suspected hepatic steatosis.  Correlation with LFTs is advised. Electronically Signed   By: Sandi Mariscal M.D.   On: 07/06/2019 09:02   CT Cervical Spine Wo Contrast  Result Date: 07/06/2019 CLINICAL DATA:  Dizziness, multiple falls, history of prostate cancer EXAM:  CT CERVICAL SPINE WITHOUT CONTRAST TECHNIQUE: Multidetector CT imaging of the cervical spine was performed without intravenous contrast. Multiplanar CT image reconstructions were also generated. COMPARISON:  None. FINDINGS: Alignment: Anteroposterior alignment is maintained. Skull base and vertebrae: Vertebral body heights are preserved. There is no suspicious osseous lesion. No acute fracture. Soft tissues and spinal canal: No prevertebral fluid or swelling. No visible canal hematoma. Disc levels:  There is no significant degenerative stenosis. Upper chest: Negative. Other: None. IMPRESSION: No significant or acute osseous abnormality. Electronically Signed   By: Macy Mis M.D.   On: 07/06/2019 08:45   CT ABDOMEN PELVIS W CONTRAST  Result Date: 07/06/2019 CLINICAL DATA:  Multiple falls over the past several months. History of prostate cancer. EXAM: CT CHEST, ABDOMEN, AND PELVIS WITH CONTRAST TECHNIQUE: Multidetector CT imaging of the chest, abdomen and pelvis was performed following the standard protocol during bolus administration of intravenous contrast. CONTRAST:  170m OMNIPAQUE IOHEXOL 300 MG/ML  SOLN COMPARISON:  CT abdomen pelvis-06/26/2018; 09/11/2011 FINDINGS: CT CHEST FINDINGS  Cardiovascular: Normal heart size. Post median sternotomy and CABG with calcifications within the native coronary arteries. No pericardial effusion. Normal caliber of the thoracic aorta. No evidence of thoracic aortic dissection or periaortic stranding on this nongated examination. The left vertebral artery is incidentally noted to arise directly from the aortic arch. The branch vessels of the aortic arch appear patent throughout their imaged courses. Although this examination was not tailored for the evaluation the pulmonary arteries, there are no discrete filling defects within the central pulmonary arterial tree to suggest central pulmonary embolism. Normal caliber the main pulmonary artery. Mediastinum/Nodes: No bulky mediastinal, hilar or axillary lymphadenopathy. Lungs/Pleura: There is a punctate (approximate 0.5 cm) subpleural nodule within right upper lobe (image 46, series 7 as well as a smaller (approximately 0.3 cm) subpleural nodule within the left upper lobe (image 59, series 7). Subsegmental atelectasis within the bilateral lower lobes, left greater than right as well as about the anterior aspect of the right minor fissure and within the lingula. No discrete focal airspace opacities. No pleural effusion or pneumothorax. The central pulmonary airways appear widely patent. Musculoskeletal: No acute or aggressive osseous abnormalities. Mild symmetric gynecomastia. Regional soft tissues appear otherwise normal. The thyroid gland appears atrophic. _________________________________________________________ CT ABDOMEN PELVIS FINDINGS Hepatobiliary: There is diffuse decreased attenuation of the hepatic parenchyma on this postcontrast examination suggestive of hepatic steatosis. Punctate (subcentimeter) hypoattenuating lesions within the right lobe of the liver (image 62, series 6) as well as the caudate (image 62, series 6), are too small to adequately characterize though unchanged compared to the 06/2018  examination and favored to represent hepatic cysts. Post cholecystectomy. No intra or extrahepatic biliary ductal dilatation. No ascites. Pancreas: Normal appearance of the pancreas. Spleen: Normal appearance of the spleen. Adrenals/Urinary Tract: There is symmetric enhancement and excretion of the bilateral kidneys. Solitary punctate (2 mm) nonobstructing renal stones are seen bilaterally (right coronal image 104, series 8; left coronal image 87, series 8). No discrete renal lesions. There is a minimal amount of grossly symmetric bilateral perinephric stranding. No urine obstruction. There is mild thickening of the left adrenal gland without discrete nodule. Normal appearance of the right adrenal gland. There is a approximately 0.6 cm stone lying dependently within the urinary bladder (image 116, series 6, similar to the 06/26/2018 examination. Mild thickening of the urinary bladder wall. Stomach/Bowel: There is circumferential wall thickening involving the sigmoid colon to the level of the rectum (representative images 102 and 107, series 6), with  associated minimal amount of fluid in the pelvic cul-de-sac but without definable/drainable fluid collection (sagittal image 99, series 9). This finding is associated with mild patulous distension of the upstream colon, not definitely resulting in enteric obstruction. Circumferential wall thickening involving several loops of mid jejunum (representative image 52 and 63, series 6), similar to the 06/2018 examination though new compared to the 08/2011 examination. Normal appearance of the terminal ileum and the retrocecal appendix. No pneumoperitoneum, pneumatosis or portal venous gas. Vascular/Lymphatic: There is a moderate to large amount of eccentric mixed calcified and noncalcified atherosclerotic plaque throughout the normal caliber abdominal aorta. Moderate to large amount calcified atherosclerotic plaque involving the right common iliac artery approaches 50% luminal  narrowing (image 93, series 6, similar to the 2013 examination. No bulky mediastinal, hilar or axillary lymphadenopathy. 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 Other: There is a minimal amount of ill-defined subcutaneous edema about the left flank. Regional soft tissues appear otherwise normal. Musculoskeletal: No acute or aggressive osseous abnormalities within the abdomen or pelvis. Mild multilevel lumbar spine DDD, worse at L3-L4 and L4-L5 with disc space height loss, endplate irregularity and sclerosis. IMPRESSION: Chest CT impression: 1. No acute cardiopulmonary disease. 2. Post median sternotomy and CABG. Calcifications within native coronary arteries. Aortic Atherosclerosis (ICD10-I70.0). 3. Bilateral punctate pulmonary nodules, largest of which within the right upper lobe measures 0.5 cm in diameter. No follow-up needed if patient is low-risk (and has no known or suspected primary neoplasm). Non-contrast chest CT can be considered in 12 months if patient is high-risk. This recommendation follows the consensus statement: Guidelines for Management of Incidental Pulmonary Nodules Detected on CT Images: From the Fleischner Society 2017; Radiology 2017; 284:228-243. Abdomen and pelvic CT impression: 1. Apparent circumferential wall thickening involving the sigmoid colon extending with patulous distension of the upstream colon, though not resulting in enteric obstruction. Findings are nonspecific though could be seen in the setting of an enteritis. Further evaluation with colonoscopy could be performed as indicated. 2. Mild circumferential wall thickening involving several loops of mid small bowel, not resulting in enteric obstruction, similar to the 06/2018 examination while potentially secondary to peristalsis though additional location of an enteritis could have a similar appearance. 3. Redemonstrated 0.6 cm  urinary bladder calculus, similar to the 06/2018 examination. 4. Redemonstrated solitary punctate (2 mm) nonobstructing bilateral renal stones. 5. Suspected hepatic steatosis.  Correlation with LFTs is advised. Electronically Signed   By: Sandi Mariscal M.D.   On: 07/06/2019 09:02   DG Chest Portable 1 View  Result Date: 07/06/2019 CLINICAL DATA:  69 year old male with history of trauma from a fall. Evaluate for potential pneumonia. EXAM: PORTABLE CHEST 1 VIEW COMPARISON:  Chest x-ray 06/13/2019. FINDINGS: Lung volumes are low. Linear opacities in the left base may reflect areas of subsegmental atelectasis or scarring. No consolidative airspace disease. No pleural effusions. No pneumothorax. No pulmonary nodule or mass noted. Pulmonary vasculature and the cardiomediastinal silhouette are within normal limits. Status post median sternotomy for CABG. IMPRESSION: 1. Low lung volumes with probable subsegmental atelectasis in the left base. 2. Postoperative changes, as above. Electronically Signed   By: Vinnie Langton M.D.   On: 07/06/2019 07:30   DG Toe Great Left  Result Date: 07/06/2019 CLINICAL DATA:  Pain and swelling involving the LEFT great toe. No known injuries. EXAM: LEFT GREAT TOE-3 VIEWS COMPARISON:  LEFT foot x-rays 12/31/2018. FINDINGS: No evidence of acute fracture or dislocation.  Joint spaces well preserved. Well-preserved bone mineral density. No intrinsic osseous abnormalities. IMPRESSION: Normal examination. Electronically Signed   By: Evangeline Dakin M.D.   On: 07/06/2019 10:43    Medications:   . heparin  5,000 Units Subcutaneous Q8H  . insulin aspart  0-5 Units Subcutaneous QHS  . insulin aspart  0-9 Units Subcutaneous TID WC  . levothyroxine  125 mcg Oral Q0600   Continuous Infusions: . sodium chloride 100 mL/hr at 07/06/19 1653  . ceFEPime (MAXIPIME) IV 2 g (07/07/19 1120)  . vancomycin       LOS: 0 days   Radene Gunning NP [facility]   How to contact the [facility]  Attending or Consulting provider [place] or covering provider during after hours [place], for this patient?  1. Check the care team in [facility] and look for a) attending/consulting [facility] provider listed and b) the [facility] team listed 2. Log into [email or web address] and use [place]'s universal password to access. If you do not have the password, please contact the hospital operator. 3. Locate the [facility] provider you are looking for under [facility] and page to a number that you can be directly reached. 4. If you still have difficulty reaching the provider, please page the [facility] (Director on Call) for the Hospitalists listed on amion for assistance.  07/07/2019, 12:33 PM

## 2019-07-07 NOTE — Care Management Obs Status (Signed)
[place] NOTIFICATION   Patient Details  Name: SHOLEM VENHUIZEN MRN: [id number] Date of Birth: [birth year]   Medicare Observation Status Notification Given:  Yes    Atilano Median, LCSW 07/07/2019, 4:22 PM

## 2019-07-07 NOTE — Progress Notes (Signed)
Pharmacy Antibiotic Note  Dean Cole is a 69 y.o. male admitted on 07/06/2019 with AMS for 1 month PTA and dizziness.  Pharmacy has been consulted for vancomycin and cefepime dosing for sepsis of unknown origin.  Renal function improving - SCr down 1.02, CrCL 72 ml/min, now afebrile, WBC WNL, LA down 2.6, PCT 4.08.  Plan: Change vanc to 1gm IV Q12H for AUC 512 using SCr 1.02 Continue cefepime 2gm IV Q8H Monitor renal fxn, clinical progress, vanc AUC pending LOT  Height: 5\' 10"  (177.8 cm) Weight: 187 lb (84.8 kg) IBW/kg (Calculated) : 73  Temp (24hrs), Avg:98.5 F (36.9 C), Min:98.2 F (36.8 C), Max:99 F (37.2 C)  Recent Labs  Lab 07/06/19 0630 07/06/19 0713 07/06/19 0727 07/06/19 0934 07/06/19 1606 07/07/19 0249  WBC 5.5  --   --   --   --  3.8*  CREATININE 1.42*  --  1.20  --   --  1.02  LATICACIDVEN  --  4.7*  --  3.2* 2.6*  --     Estimated Creatinine Clearance: 71.6 mL/min (by C-G formula based on SCr of 1.02 mg/dL).    Allergies  Allergen Reactions  . Eggs Or Egg-Derived Products Other (See Comments)    Bloating, nausea, and no rash   . Adhesive [Tape] Swelling    Vanc 3/7 >> Cefepime 3/7 >> Flagyl x1 3/7 >>  3/7 covid - negative 3/7 UCx - negative 3/7 BCx - NGTD 3/8 C.diff - 3/8 GI PCR -   Dean Cole D. Dean Cole, PharmD, BCPS, [place] 07/07/2019, 1:14 PM

## 2019-07-07 NOTE — Progress Notes (Signed)
Pt refuses heparin.  He stated "I can't take heparin, my friend died from heparin." pt has been educated on the importance of taking this medication and continued to refuse

## 2019-07-07 NOTE — Evaluation (Signed)
Physical Therapy Evaluation Patient Details Name: Dean Cole MRN: [id number] DOB: [birth year] Today's Date: 07/07/2019   History of Present Illness  69 y.o. male admitted on 07/06/19 for sepsis (unclear source), N/V/D, acute kidney injury, postural dizziness, AMS.  Pt with significant PMH of PAD, HTN, CAD, DM2, CABG, HOH.    Clinical Impression  Pt was able to walk around the unit with some mild unsteadiness noted during gait.  He would benefit from using a cane for safety and balance.  He reports his fall was in a slick bathroom with the lights off.  He does seem to have some STM deficits, but did get his orientation questions correct today (admitting he did not yesterday), but with increased time to answer correctly.  He may politely refuse home therapy for balance as he reports he is busy with his adopted children's appointments and thinks it is a normal progression of getting older.  He did agree to talk it over with his wife (who was not present during our session).  I did order OT as he has some L shoulder pain and dysfunction since his fall.   PT to follow acutely for deficits listed below.      Follow Up Recommendations Home health PT    Equipment Recommendations  None recommended by PT    Recommendations for Other Services OT consult     Precautions / Restrictions Precautions Precautions: Fall Precaution Comments: pt admitts to one recent fall on the wet floor in the bathroom when the lights were off.       Mobility  Bed Mobility Overal bed mobility: Modified Independent             General bed mobility comments: extra time and used rail.   Transfers Overall transfer level: Needs assistance Equipment used: None Transfers: Sit to/from Stand Sit to Stand: Min guard         General transfer comment: Min guard assist for safety, pt needed a second to get his feet under his BOS.   Ambulation/Gait Ambulation/Gait assistance: Min guard Gait Distance (Feet): 200  Feet Assistive device: IV Pole;None Gait Pattern/deviations: Step-through pattern;Narrow base of support;Staggering right;Staggering left     General Gait Details: pt with mildly staggering gait pattern.          Balance Overall balance assessment: Needs assistance Sitting-balance support: Feet supported;No upper extremity supported Sitting balance-Leahy Scale: Good     Standing balance support: Bilateral upper extremity supported;No upper extremity supported;Single extremity supported Standing balance-Leahy Scale: Good                               Pertinent Vitals/Pain Pain Assessment: Faces Faces Pain Scale: Hurts little more Pain Location: L shoulder Pain Descriptors / Indicators: Guarding;Grimacing Pain Intervention(s): Monitored during session;Limited activity within patient's tolerance;Repositioned    Home Living Family/patient expects to be discharged to:: Private residence Living Arrangements: Spouse/significant other Available Help at Discharge: Family;Available 24 hours/day Type of Home: House Home Access: Stairs to enter Entrance Stairs-Rails: Right;Left;Can reach both Entrance Stairs-Number of Steps: 10 Home Layout: One level Home Equipment: Shower seat - built in;Cane - single point Additional Comments: He does still drive    Prior Function Level of Independence: Independent         Comments: h/o falls per chart        Extremity/Trunk Assessment   Upper Extremity Assessment Upper Extremity Assessment: Defer to OT evaluation    Lower  Extremity Assessment Lower Extremity Assessment: Generalized weakness    Cervical / Trunk Assessment Cervical / Trunk Assessment: Normal  Communication   Communication: HOH  Cognition Arousal/Alertness: Awake/alert Behavior During Therapy: WFL for tasks assessed/performed Overall Cognitive Status: Impaired/Different from baseline Area of Impairment: Memory                     Memory:  Decreased short-term memory         General Comments: Pt seems to have some STM deficits, often starts to tell a story and gets lost in the middle forgetting what he is talking about.  He is oriented x4 now, but admitts to not getting it right yesterday.  It also takes him longer than normal to come up with the right answer to basic orientation questions.        General Comments General comments (skin integrity, edema, etc.): I spoke at length with the pt re: his recent falls, his home situation (multiple adopted, special needs children, and his ability to participate in some home balance therapy.  He wants to talk it over with his wife, but is not sure that he can participate due to the many appointments that the children have.          Assessment/Plan    PT Assessment Patient needs continued PT services  PT Problem List Decreased balance;Decreased cognition;Decreased knowledge of use of DME;Decreased safety awareness;Decreased knowledge of precautions       PT Treatment Interventions DME instruction;Gait training;Stair training;Functional mobility training;Therapeutic activities;Therapeutic exercise;Balance training;Neuromuscular re-education;Cognitive remediation;Patient/family education    PT Goals (Current goals can be found in the Care Plan section)  Acute Rehab PT Goals Patient Stated Goal: to go home PT Goal Formulation: With patient Time For Goal Achievement: 07/21/19 Potential to Achieve Goals: Good    Frequency Min 3X/week           AM-PAC PT "6 Clicks" Mobility  Outcome Measure Help needed turning from your back to your side while in a flat bed without using bedrails?: A Little Help needed moving from lying on your back to sitting on the side of a flat bed without using bedrails?: A Little Help needed moving to and from a bed to a chair (including a wheelchair)?: A Little Help needed standing up from a chair using your arms (e.g., wheelchair or bedside chair)?: A  Little Help needed to walk in hospital room?: A Little Help needed climbing 3-5 steps with a railing? : A Little 6 Click Score: 18    End of Session Equipment Utilized During Treatment: Gait belt Activity Tolerance: Patient tolerated treatment well Patient left: in chair;with call bell/phone within reach;with chair alarm set Nurse Communication: Mobility status PT Visit Diagnosis: Muscle weakness (generalized) (M62.81);Difficulty in walking, not elsewhere classified (R26.2);Repeated falls (R29.6)    Time: [id number] PT Time Calculation (min) (ACUTE ONLY): 41 min   Charges:       Verdene Lennert, PT, DPT  Acute Rehabilitation [phone] pager #[phone] office  @ [place]: [phone]   PT Evaluation $PT Eval Moderate Complexity: 1 Mod PT Treatments $Gait Training: 8-22 mins $Therapeutic Activity: 8-22 mins       07/07/2019, 3:13 PM

## 2019-07-08 DIAGNOSIS — R2689 Other abnormalities of gait and mobility: Secondary | ICD-10-CM | POA: Insufficient documentation

## 2019-07-08 DIAGNOSIS — R630 Anorexia: Secondary | ICD-10-CM | POA: Insufficient documentation

## 2019-07-08 DIAGNOSIS — A419 Sepsis, unspecified organism: Secondary | ICD-10-CM | POA: Diagnosis not present

## 2019-07-08 DIAGNOSIS — R6520 Severe sepsis without septic shock: Secondary | ICD-10-CM | POA: Diagnosis not present

## 2019-07-08 DIAGNOSIS — R4182 Altered mental status, unspecified: Secondary | ICD-10-CM | POA: Diagnosis not present

## 2019-07-08 DIAGNOSIS — R296 Repeated falls: Secondary | ICD-10-CM | POA: Insufficient documentation

## 2019-07-08 LAB — COMPREHENSIVE METABOLIC PANEL
ALT: 24 U/L (ref 0–44)
AST: 28 U/L (ref 15–41)
Albumin: 3 g/dL — ABNORMAL LOW (ref 3.5–5.0)
Alkaline Phosphatase: 82 U/L (ref 38–126)
Anion gap: 12 (ref 5–15)
BUN: 9 mg/dL (ref 8–23)
CO2: 23 mmol/L (ref 22–32)
Calcium: 7.6 mg/dL — ABNORMAL LOW (ref 8.9–10.3)
Chloride: 105 mmol/L (ref 98–111)
Creatinine, Ser: 0.97 mg/dL (ref 0.61–1.24)
GFR calc Af Amer: >60 mL/min (ref >60)
GFR calc non Af Amer: >60 mL/min (ref >60)
Glucose, Bld: 147 mg/dL — ABNORMAL HIGH (ref 70–99)
Potassium: 3 mmol/L — ABNORMAL LOW (ref 3.5–5.1)
Sodium: 140 mmol/L (ref 135–145)
Total Bilirubin: 0.8 mg/dL (ref 0.3–1.2)
Total Protein: 5.9 g/dL — ABNORMAL LOW (ref 6.5–8.1)

## 2019-07-08 LAB — CBC
HCT: 30 % — ABNORMAL LOW (ref 39.0–52.0)
Hemoglobin: 10 g/dL — ABNORMAL LOW (ref 13.0–17.0)
MCH: 29.8 pg (ref 26.0–34.0)
MCHC: 33.3 g/dL (ref 30.0–36.0)
MCV: 89.3 fL (ref 80.0–100.0)
Platelets: 70 10*3/uL — ABNORMAL LOW (ref 150–400)
RBC: 3.36 MIL/uL — ABNORMAL LOW (ref 4.22–5.81)
RDW: 14.2 % (ref 11.5–15.5)
WBC: 3.5 10*3/uL — ABNORMAL LOW (ref 4.0–10.5)
nRBC: 0 % (ref 0.0–0.2)

## 2019-07-08 LAB — GLUCOSE, CAPILLARY
Glucose-Capillary: 141 mg/dL — ABNORMAL HIGH (ref 70–99)
Glucose-Capillary: 153 mg/dL — ABNORMAL HIGH (ref 70–99)

## 2019-07-08 LAB — MAGNESIUM: Magnesium: 1 mg/dL — ABNORMAL LOW (ref 1.7–2.4)

## 2019-07-08 MED ORDER — METRONIDAZOLE 500 MG PO TABS: 500.0000 mg | ORAL_TABLET | Freq: Three times a day (TID) | ORAL | 0 refills | Status: AC

## 2019-07-08 MED ORDER — MAGNESIUM OXIDE 400 (241.3 MG) MG PO TABS: 200.0000 mg | ORAL_TABLET | Freq: Every day | ORAL | Status: DC

## 2019-07-08 MED ORDER — CIPROFLOXACIN HCL 500 MG PO TABS: 500.0000 mg | ORAL_TABLET | Freq: Two times a day (BID) | ORAL | 0 refills | Status: AC

## 2019-07-08 MED ORDER — MAGNESIUM OXIDE 400 (241.3 MG) MG PO TABS: 400.0000 mg | ORAL_TABLET | Freq: Every day | ORAL | 0 refills | Status: DC

## 2019-07-08 MED ORDER — DIAZEPAM 5 MG PO TABS: 5.0000 mg | ORAL_TABLET | Freq: Every evening | ORAL | 0 refills | Status: DC | PRN

## 2019-07-08 MED ORDER — POTASSIUM CHLORIDE CRYS ER 20 MEQ PO TBCR
40.0000 meq | EXTENDED_RELEASE_TABLET | ORAL | Status: DC
  Administered 2019-07-08: 40 meq via ORAL
  Filled 2019-07-08 (×2): qty 2

## 2019-07-08 NOTE — Progress Notes (Signed)
Dean Cole is a 70 y.o. male with past medical history of prostate cancer, DM type II, PAD, CAD (s/p CABG), HTN, hearing loss and recent PNA currently hospitalized for sepsis of unknown origin presenting for consult of persistent diarrhea since Jan 2021.    Subjective: Patient reports he is feeling well this morning.  He states he had one small bowel movement overnight.  He denies any abdominal pain, endorses good appetite, and is tolerating a full liquid diet.  He denies any concerns.  Objective: Vital signs in last 24 hours: Temp:  [97.7 F (36.5 C)-98.4 F (36.9 C)] 97.7 F (36.5 C) (03/09 0808) Pulse Rate:  [85-86] 85 (03/09 0808) Resp:  [16] 16 (03/09 0808) BP: (135-139)/(69-73) 139/73 (03/09 0808) SpO2:  [97 %-98 %] 97 % (03/09 0808) Weight change:  Last BM Date: 07/06/19  PE: GENERAL: Sitting up in chair, eating ice cream, in no acute distress ABDOMEN: Soft, nontender, nondistended.  No masses noted.  Normoactive bowel sounds.  No guarding or rebound. EXTREMITIES: Without edema  Lab Results: Results for orders placed or performed during the hospital encounter of 07/06/19 (from the past 48 hour(s))  CBG monitoring, ED     Status: Abnormal   Collection Time: 07/06/19 12:22 PM  Result Value Ref Range   Glucose-Capillary 136 (H) 70 - 99 mg/dL    Comment: Glucose reference range applies only to samples taken after fasting for at least 8 hours.  HIV Antibody (routine testing w rflx)     Status: None   Collection Time: 07/06/19  4:06 PM  Result Value Ref Range   HIV Screen 4th Generation wRfx NON REACTIVE NON REACTIVE    Comment: Performed at [facility], [address] [place], [place] [address]  Hemoglobin A1c     Status: Abnormal   Collection Time: 07/06/19  4:06 PM  Result Value Ref Range   Hgb A1c MFr Bld 7.2 (H) 4.8 - 5.6 %    Comment: (NOTE) Pre diabetes:          5.7%-6.4% Diabetes:              >6.4% Glycemic control for   <7.0% adults with diabetes    Mean Plasma Glucose 159.94 mg/dL    Comment: Performed at [facility] [address] [place], [place] [address]  Lactic acid, plasma     Status: Abnormal   Collection Time: 07/06/19  4:06 PM  Result Value Ref Range   Lactic Acid, Venous 2.6 (HH) 0.5 - 1.9 mmol/L    Comment: CRITICAL VALUE NOTED.  VALUE IS CONSISTENT WITH PREVIOUSLY REPORTED AND CALLED VALUE. Performed at [facility], [place] [address], [place], [place] [address]   Glucose, capillary     Status: Abnormal   Collection Time: 07/06/19  4:33 PM  Result Value Ref Range   Glucose-Capillary 137 (H) 70 - 99 mg/dL    Comment: Glucose reference range applies only to samples taken after fasting for at least 8 hours.  Glucose, capillary     Status: Abnormal   Collection Time: 07/06/19  9:12 PM  Result Value Ref Range   Glucose-Capillary 112 (H) 70 - 99 mg/dL    Comment: Glucose reference range applies only to samples taken after fasting for at least 8 hours.  Protime-INR     Status: Abnormal   Collection Time: 07/07/19  2:49 AM  Result Value Ref Range   Prothrombin Time 16.0 (H) 11.4 - 15.2 seconds  INR 1.3 (H) 0.8 - 1.2    Comment: (NOTE) INR goal varies based on device and disease states. Performed at [facility], [place] [address] [place], [place] [address]   Cortisol-am, blood     Status: None   Collection Time: 07/07/19  2:49 AM  Result Value Ref Range   Cortisol - AM 20.9 6.7 - 22.6 ug/dL    Comment: Performed at [facility], [place] [address] [place], [place] [address]  Procalcitonin     Status: None   Collection Time: 07/07/19  2:49 AM  Result Value Ref Range   Procalcitonin 4.08 ng/mL    Comment:        Interpretation: PCT > 2 ng/mL: Systemic infection (sepsis) is likely, unless other causes are known. (NOTE)       Sepsis PCT Algorithm           Lower Respiratory Tract                                      Infection PCT Algorithm    ----------------------------      ----------------------------         PCT < 0.25 ng/mL                PCT < 0.10 ng/mL         Strongly encourage             Strongly discourage   discontinuation of antibiotics    initiation of antibiotics    ----------------------------     -----------------------------       PCT 0.25 - 0.50 ng/mL            PCT 0.10 - 0.25 ng/mL               OR       >80% decrease in PCT            Discourage initiation of                                            antibiotics      Encourage discontinuation           of antibiotics    ----------------------------     -----------------------------         PCT >= 0.50 ng/mL              PCT 0.26 - 0.50 ng/mL               AND       <80% decrease in PCT              Encourage initiation of                                             antibiotics       Encourage continuation           of antibiotics    ----------------------------     -----------------------------        PCT >= 0.50 ng/mL                  PCT > 0.50 ng/mL  AND         increase in PCT                  Strongly encourage                                      initiation of antibiotics    Strongly encourage escalation           of antibiotics                                     -----------------------------                                           PCT <= 0.25 ng/mL                                                 OR                                        > 80% decrease in PCT                                     Discontinue / Do not initiate                                             antibiotics Performed at [facility], [address] [place], [place] [address]   CBC     Status: Abnormal   Collection Time: 07/07/19  2:49 AM  Result Value Ref Range   WBC 3.8 (L) 4.0 - 10.5 K/uL   RBC 3.46 (L) 4.22 - 5.81 MIL/uL   Hemoglobin 10.3 (L) 13.0 - 17.0 g/dL    Comment: REPEATED TO VERIFY   HCT 31.3 (L) 39.0 - 52.0 %   MCV 90.5 80.0 - 100.0 fL   MCH 29.8 26.0 - 34.0  pg   MCHC 32.9 30.0 - 36.0 g/dL   RDW 14.3 11.5 - 15.5 %   Platelets 69 (L) 150 - 400 K/uL    Comment: REPEATED TO VERIFY SPECIMEN CHECKED FOR CLOTS Immature Platelet Fraction may be clinically indicated, consider ordering this additional test [id number] CONSISTENT WITH PREVIOUS RESULT    nRBC 0.0 0.0 - 0.2 %    Comment: Performed at [facility], [place] [address], [place], [place] [id number]  Basic metabolic panel     Status: Abnormal   Collection Time: 07/07/19  2:49 AM  Result Value Ref Range   Sodium 140 135 - 145 mmol/L   Potassium 3.5 3.5 - 5.1 mmol/L   Chloride 105 98 - 111 mmol/L   CO2 24 22 - 32 mmol/L   Glucose, Bld 159 (H) 70 - 99 mg/dL    Comment: Glucose reference range applies only to  samples taken after fasting for at least 8 hours.   BUN 11 8 - 23 mg/dL   Creatinine, Ser 1.02 0.61 - 1.24 mg/dL   Calcium 7.7 (L) 8.9 - 10.3 mg/dL   GFR calc non Af Amer >60 >60 mL/min   GFR calc Af Amer >60 >60 mL/min   Anion gap 11 5 - 15    Comment: Performed at [facility] [address] [place], [place] [address]  Hepatitis panel, acute     Status: None   Collection Time: 07/07/19  2:49 AM  Result Value Ref Range   Hepatitis B Surface Ag NON REACTIVE NON REACTIVE   HCV Ab NON REACTIVE NON REACTIVE    Comment: (NOTE) Nonreactive HCV antibody screen is consistent with no HCV infections,  unless recent infection is suspected or other evidence exists to indicate HCV infection.    Hep A IgM NON REACTIVE NON REACTIVE   Hep B C IgM NON REACTIVE NON REACTIVE    Comment: Performed at [facility], [place] [address], [place], [place] [address]  RPR     Status: None   Collection Time: 07/07/19  2:49 AM  Result Value Ref Range   RPR Ser Ql NON REACTIVE NON REACTIVE    Comment: Performed at [facility], [place] [address] [place], [place] [address]  Vitamin B12     Status: None   Collection Time: 07/07/19  2:49 AM  Result Value Ref Range   Vitamin B-12 354 180 -  914 pg/mL    Comment: (NOTE) This assay is not validated for testing neonatal or myeloproliferative syndrome specimens for Vitamin B12 levels. Performed at [facility], [place] [address], [place], [place] [address]   Glucose, capillary     Status: Abnormal   Collection Time: 07/07/19  8:33 AM  Result Value Ref Range   Glucose-Capillary 148 (H) 70 - 99 mg/dL    Comment: Glucose reference range applies only to samples taken after fasting for at least 8 hours.  C difficile quick scan w PCR reflex     Status: None   Collection Time: 07/07/19 10:33 AM   Specimen: STOOL  Result Value Ref Range   C Diff antigen NEGATIVE NEGATIVE   C Diff toxin NEGATIVE NEGATIVE   C Diff interpretation No C. difficile detected.     Comment: Performed at [facility], [place] [address], [place], [place] [address]  Glucose, capillary     Status: Abnormal   Collection Time: 07/07/19 12:01 PM  Result Value Ref Range   Glucose-Capillary 172 (H) 70 - 99 mg/dL    Comment: Glucose reference range applies only to samples taken after fasting for at least 8 hours.  Glucose, capillary     Status: Abnormal   Collection Time: 07/07/19  5:17 PM  Result Value Ref Range   Glucose-Capillary 117 (H) 70 - 99 mg/dL    Comment: Glucose reference range applies only to samples taken after fasting for at least 8 hours.  Glucose, capillary     Status: Abnormal   Collection Time: 07/07/19  8:30 PM  Result Value Ref Range   Glucose-Capillary 128 (H) 70 - 99 mg/dL    Comment: Glucose reference range applies only to samples taken after fasting for at least 8 hours.  CBC     Status: Abnormal   Collection Time: 07/08/19  2:12 AM  Result Value Ref Range   WBC 3.5 (L) 4.0 - 10.5 K/uL  RBC 3.36 (L) 4.22 - 5.81 MIL/uL   Hemoglobin 10.0 (L) 13.0 - 17.0 g/dL   HCT 30.0 (L) 39.0 - 52.0 %   MCV 89.3 80.0 - 100.0 fL   MCH 29.8 26.0 - 34.0 pg   MCHC 33.3 30.0 - 36.0 g/dL   RDW 14.2 11.5 - 15.5 %   Platelets 70 (L) 150 - 400 K/uL     Comment: REPEATED TO VERIFY SPECIMEN CHECKED FOR CLOTS Immature Platelet Fraction may be clinically indicated, consider ordering this additional test [id number] CONSISTENT WITH PREVIOUS RESULT    nRBC 0.0 0.0 - 0.2 %    Comment: Performed at [facility], [place] [address] [place], [place] [address]  Comprehensive metabolic panel     Status: Abnormal   Collection Time: 07/08/19  2:12 AM  Result Value Ref Range   Sodium 140 135 - 145 mmol/L   Potassium 3.0 (L) 3.5 - 5.1 mmol/L   Chloride 105 98 - 111 mmol/L   CO2 23 22 - 32 mmol/L   Glucose, Bld 147 (H) 70 - 99 mg/dL    Comment: Glucose reference range applies only to samples taken after fasting for at least 8 hours.   BUN 9 8 - 23 mg/dL   Creatinine, Ser 0.97 0.61 - 1.24 mg/dL   Calcium 7.6 (L) 8.9 - 10.3 mg/dL   Total Protein 5.9 (L) 6.5 - 8.1 g/dL   Albumin 3.0 (L) 3.5 - 5.0 g/dL   AST 28 15 - 41 U/L   ALT 24 0 - 44 U/L   Alkaline Phosphatase 82 38 - 126 U/L   Total Bilirubin 0.8 0.3 - 1.2 mg/dL   GFR calc non Af Amer >60 >60 mL/min   GFR calc Af Amer >60 >60 mL/min   Anion gap 12 5 - 15    Comment: Performed at [facility] [address], [place], [place] [address]  Magnesium     Status: Abnormal   Collection Time: 07/08/19  2:12 AM  Result Value Ref Range   Magnesium 1.0 (L) 1.7 - 2.4 mg/dL    Comment: Performed at [facility] [address], [place], [place] [address]  Glucose, capillary     Status: Abnormal   Collection Time: 07/08/19  8:05 AM  Result Value Ref Range   Glucose-Capillary 141 (H) 70 - 99 mg/dL    Comment: Glucose reference range applies only to samples taken after fasting for at least 8 hours.    Studies/Results: No results found.  Medications: I have reviewed the patient's current medications.  Impression: Diarrhea persisting since January 2021.  Unknown etiology.  C. difficile negative. Recent use of doxycycline Normal renal function  Now pancytopenic  Platelets  decreased from 95 on admission to 69 yesterday and 70 today RBC 3.36 with hemoglobin 10.0; decreased from RBC 4.39 and Hgb 12.9 on admission WBC is now 3.5, decreased from 3.8 yesterday, and 5.5 on admission 2 days ago  Hypokalemic with potassium of 3.0 today, decreased from 3.5 yesterday and 4.1 two days ago  Albumin decreased to 3.0 today from 3.5 yesterday  Plan: 1.  Await GI pathogen panel (still pending as of this morning). 2.  Continue IVF hydration. 3. Rediscussed colonoscopy with patient, if pathogen panel is negative, and patient continues to refuse inpatient colonoscopy.  Discussed benefits of colonoscopy and reasons for colonoscopy, including investigating source of diarrhea, as well as screening for colon cancer.    Patient stated he would prefer to  see Dr. Thurmond Butts as an outpatient.  If patient does not have an inpatient colonoscopy, patient should have an outpatient colonoscopy in the near future.  4.  With ongoing thrombocytopenia, now pancytopenic, may benefit from hematology consultation. 5.  Agree with potassium repletion.    Ronnette Juniper, MD 07/08/2019, 10:53 AM

## 2019-07-08 NOTE — TOC Transition Note (Signed)
Transition of Care [facility]) - CM/SW Discharge Note   Patient Details  Name: Dean Cole MRN: [id number] Date of Birth: November 01, 1950  [facility] of [facility]) CM/SW Contact:  Atilano Median, LCSW Phone Number: 07/08/2019, 1:23 PM   Clinical Narrative:     Discharged home. Patient and wife declined the need for any resources/home health needs at discharge. No other needs at this time. Case closed to this CSW.  Final next level of care: Home/Self Care Barriers to Discharge: Barriers Resolved   Patient Goals and CMS Choice        Discharge Placement                       Discharge Plan and Services                                     Social Determinants of Health (SDOH) Interventions     Readmission Risk Interventions No flowsheet data found.

## 2019-07-08 NOTE — Progress Notes (Signed)
Occupational Therapy Evaluation Patient Details Name: Dean Cole MRN: [id number] DOB: 11/14/1950 Today's Date: 07/08/2019    History of Present Illness 69 y.o. male admitted on 07/06/19 for sepsis (unclear source), N/V/D, acute kidney injury, postural dizziness, AMS.  Pt with significant PMH of PAD, HTN, CAD, DM2, CABG, HOH.     Clinical Impression   Patient lives at home with wife who manages medications and finances.  Patient has notable short term memory deficits and poor safety awareness.  He is aware of memory issues and "doesn't even want to deal with that".  Patient recently fell on slick bathroom floor and was complaining of "level 8" pain in L shoulder, though displayed no grimacing or increased complaints with motion and resistance.  L UE ROM and strength WFL.  He completed mobility and standing ADLs with supervision.  Patient would benefit from working on improving safety awareness and balance with functional mobility.  Will continue to follow with OT acutely to address the deficits listed below.       Follow Up Recommendations  Home health OT    Equipment Recommendations  None recommended by OT    Recommendations for Other Services       Precautions / Restrictions Precautions Precautions: Fall Precaution Comments: pt admitts to one recent fall on the wet floor in the bathroom when the lights were off.  Restrictions Weight Bearing Restrictions: No      Mobility Bed Mobility Overal bed mobility: Modified Independent                Transfers Overall transfer level: Needs assistance Equipment used: None Transfers: Sit to/from Stand Sit to Stand: Supervision              Balance Overall balance assessment: Needs assistance Sitting-balance support: Feet supported;No upper extremity supported Sitting balance-Leahy Scale: Good     Standing balance support: No upper extremity supported;During functional activity Standing balance-Leahy Scale: Fair                              ADL either performed or assessed with clinical judgement   ADL Overall ADL's : Needs assistance/impaired Eating/Feeding: Independent;Sitting   Grooming: Supervision/safety;Standing   Upper Body Bathing: Supervision/ safety;Standing   Lower Body Bathing: Supervison/ safety;Sit to/from stand   Upper Body Dressing : Set up;Supervision/safety;Sitting   Lower Body Dressing: Supervision/safety;Set up;Sit to/from stand   Toilet Transfer: Supervision/safety;Ambulation           Functional mobility during ADLs: Supervision/safety       Vision         Perception     Praxis      Pertinent Vitals/Pain Pain Assessment: 0-10 Pain Score: 8  Pain Location: L shoulder Pain Descriptors / Indicators: Aching Pain Intervention(s): Limited activity within patient's tolerance;Monitored during session     Hand Dominance Right   Extremity/Trunk Assessment Upper Extremity Assessment Upper Extremity Assessment: Generalized weakness;LUE deficits/detail LUE Deficits / Details: Complaining of some pain in shoulder but able to move full ROM and has 4+/5 strength without any increase in pain           Communication Communication Communication: HOH   Cognition Arousal/Alertness: Awake/alert Behavior During Therapy: WFL for tasks assessed/performed Overall Cognitive Status: Impaired/Different from baseline Area of Impairment: Memory                     Memory: Decreased short-term memory  General Comments: Patient not oriented to time.  Repeated many stories within minutes of eachother. Demonstrated STM deficits, unable to recall 2/3 words.  Unable to attempt stating months of year backwards.   General Comments       Exercises     Shoulder Instructions      Home Living Family/patient expects to be discharged to:: Private residence Living Arrangements: Spouse/significant other Available Help at Discharge:  Family;Available 24 hours/day Type of Home: House Home Access: Stairs to enter [employer] of Steps: 10 Entrance Stairs-Rails: Right;Left;Can reach both Home Layout: One level     Bathroom Shower/Tub: Occupational psychologist: Standard     Home Equipment: Shower seat - built in;Cane - single point   Additional Comments: He does still drive      Prior Functioning/Environment Level of Independence: Independent        Comments: Needs assistance with IADLs, says his wife does all the medication and finance management         OT Problem List: Decreased range of motion;Decreased activity tolerance;Impaired balance (sitting and/or standing);Decreased cognition;Decreased safety awareness;Pain      OT Treatment/Interventions: Self-care/ADL training;Therapeutic exercise;Therapeutic activities;Balance training;Patient/family education;Cognitive remediation/compensation    OT Goals(Current goals can be found in the care plan section) Acute Rehab OT Goals Patient Stated Goal: to go home OT Goal Formulation: With patient Time For Goal Achievement: 07/22/19 Potential to Achieve Goals: Good  OT Frequency: Min 2X/week   Barriers to D/C:            Co-evaluation              AM-PAC OT "6 Clicks" Daily Activity     Outcome Measure Help from another person eating meals?: None Help from another person taking care of personal grooming?: A Little Help from another person toileting, which includes using toliet, bedpan, or urinal?: A Little Help from another person bathing (including washing, rinsing, drying)?: A Little Help from another person to put on and taking off regular upper body clothing?: A Little Help from another person to put on and taking off regular lower body clothing?: A Little 6 Click Score: 19   End of Session Equipment Utilized During Treatment: Gait belt Nurse Communication: Mobility status  Activity Tolerance: Patient tolerated  treatment well Patient left: in chair;with call bell/phone within reach;with chair alarm set  OT Visit Diagnosis: Unsteadiness on feet (R26.81);Repeated falls (R29.6);History of falling (Z91.81);Other symptoms and signs involving cognitive function;Pain Pain - Right/Left: Left Pain - part of body: Shoulder                Time: [id number] OT Time Calculation (min): 24 min Charges:  OT General Charges $OT Visit: 1 Visit OT Evaluation $OT Eval Moderate Complexity: 1 Mod OT Treatments $Self Care/Home Management : 8-22 mins  August Luz, OTR/L   Phylliss Bob 07/08/2019, 12:06 PM

## 2019-07-08 NOTE — Progress Notes (Signed)
Physical Therapy Treatment Patient Details Name: Dean Cole MRN: [id number] DOB: 1950/05/30 Today's Date: 07/08/2019    History of Present Illness 69 y.o. male admitted on 07/06/19 for sepsis (unclear source), N/V/D, acute kidney injury, postural dizziness, AMS.  Pt with significant PMH of PAD, HTN, CAD, DM2, CABG, HOH.      PT Comments    Pt up in chair with pt's wife in room. Pt agreeable to ambulation with therapy, however apprehensive about use of SPC. Despite training on cane usage, balance with gait not overly improved with use. Pt stability similar without use of AD. Pt able to ambulate 400 feet with occasional side stepping to maintain balance. Pt and wife refusing HHPT due to having multiple dogs and busy schedule. PT currently recommending Outpatient PT for balance training to reduce falls and improve overall stability, hopeful that flexibility of scheduling will assist with pt follow through with PT at discharge.     Follow Up Recommendations  Outpatient PT     Equipment Recommendations  None recommended by PT    Recommendations for Other Services OT consult     Precautions / Restrictions Precautions Precautions: Fall Precaution Comments: pt admitts to one recent fall on the wet floor in the bathroom when the lights were off.  Restrictions Weight Bearing Restrictions: No    Mobility  Bed Mobility               General bed mobility comments: OOB in recliner on entry   Transfers Overall transfer level: Needs assistance Equipment used: None Transfers: Sit to/from Stand Sit to Stand: Min guard         General transfer comment: Min guard assist for safety, pt needed a second to get his feet under his BOS.   Ambulation/Gait Ambulation/Gait assistance: Min guard Gait Distance (Feet): 400 Feet Assistive device: IV Pole;Straight cane Gait Pattern/deviations: Step-through pattern;Narrow base of support;Staggering right;Staggering left     General Gait  Details: attempted ambulation with SPC, but continues with mildly unsteady pattern, despite SPC training balance similar to ambulation without AD          Balance Overall balance assessment: Needs assistance Sitting-balance support: Feet supported;No upper extremity supported Sitting balance-Leahy Scale: Good     Standing balance support: No upper extremity supported;Single extremity supported Standing balance-Leahy Scale: Fair                              Cognition Arousal/Alertness: Awake/alert Behavior During Therapy: WFL for tasks assessed/performed Overall Cognitive Status: Impaired/Different from baseline Area of Impairment: Memory                     Memory: Decreased short-term memory         General Comments: deflects memory problems with humor         General Comments General comments (skin integrity, edema, etc.): Continued to recommend balance training give neuropathy in feet and obvious dynamic balance deficits, pt afraid that his dogs would limit HHPT ability to visit, recommended Outpatient balance training so that pt and family could fit into their busy schedule      Pertinent Vitals/Pain Pain Assessment: Faces Faces Pain Scale: Hurts little more Pain Location: L shoulder Pain Descriptors / Indicators: Guarding;Grimacing Pain Intervention(s): Limited activity within patient's tolerance;Monitored during session;Repositioned           PT Goals (current goals can now be found in the care plan section) Acute  Rehab PT Goals Patient Stated Goal: to go home PT Goal Formulation: With patient Time For Goal Achievement: 07/21/19 Potential to Achieve Goals: Good Progress towards PT goals: Progressing toward goals    Frequency    Min 3X/week      PT Plan Discharge plan needs to be updated       AM-PAC PT "6 Clicks" Mobility   Outcome Measure  Help needed turning from your back to your side while in a flat bed without using  bedrails?: A Little Help needed moving from lying on your back to sitting on the side of a flat bed without using bedrails?: A Little Help needed moving to and from a bed to a chair (including a wheelchair)?: A Little Help needed standing up from a chair using your arms (e.g., wheelchair or bedside chair)?: A Little Help needed to walk in hospital room?: A Little Help needed climbing 3-5 steps with a railing? : A Little 6 Click Score: 18    End of Session Equipment Utilized During Treatment: Gait belt Activity Tolerance: Patient tolerated treatment well Patient left: in chair;with call bell/phone within reach;with chair alarm set Nurse Communication: Mobility status PT Visit Diagnosis: Muscle weakness (generalized) (M62.81);Difficulty in walking, not elsewhere classified (R26.2);Repeated falls (R29.6)     Time: 1116-1140 PT Time Calculation (min) (ACUTE ONLY): 24 min  Charges:  $Gait Training: 23-37 mins                     [place] Yeske B. Migdalia Dk PT, DPT Acute Rehabilitation Services Pager [phone] Office [phone]    [place] 07/08/2019, 1:49 PM

## 2019-07-08 NOTE — Discharge Summary (Signed)
Physician Discharge Summary  Dean Cole [id number] DOB: 1950/09/06 DOA: 07/06/2019  PCP: [name], MD  Admit date: 07/06/2019 Discharge date: 07/08/2019  Admitted From: home Discharge disposition: home   Recommendations for Outpatient Follow-Up:   1. Follow-up with primary care provider 1 to 2 weeks for evaluation of persistent diarrhea.  Recommend lab work to track magnesium and potassium. Follow GI pathogen results that are pending at discharge. Recommend cbc to track platelets and WBC.  2. 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  Diet recommendation: Low sodium, heart healthy.  Carbohydrate-modified.   Wound care: None.  Code status: Full.   History of Present Illness:   Dean Cole is a 69 y.o. male with medical history significant of skin cancer as well as prostate cancer, diabetes, hypertension and hypothyroidism.  Patient was seen in the ER on 06/13/2019 for the evaluation of chest pain.  Chest pain was deemed to be related to picking up a puppy and possibly a pneumonia.  He was treated with doxycycline.  He was brought to the ER 07/06/19 via EMS with complaints of dizziness when standing for the last 2 months.  Per ER report, wife reported altered mental status for the last 1 month.  Wife also reported that the patient has fallen 3 times in the previous week.    The night prior to presentation he developed multiple episodes of vomiting.    In the ER he had several episodes of vomiting as well as uncontrollable loose stools.   Patient stated that he had not been eating well for the last few weeks.  Patient was alert and oriented but a poor historian.   Patient stated that he had for adopted/foster children all of which have special needs. I called wife to gain more information regarding his baseline function, memory and issues she had noticed at home but no answer  In the ER patient was pan scanned for the reported trauma/falls and there was questionable enteritis found on the CT scan in the abdomen pelvis as well as an incidental pulmonary nodule.  Other labs were remarkable for a low calcium as well as elevated glucose, creatinine. ER PA did note that patient was ambulating in the hallways of the ER without difficulty, wife reported that he got lost going to the bathroom.   Hospital Course by Problem:   #1.  Sepsis.  Source unclear.  Urine with ketones and protein no signs of infection, chest x-ray with low lung volumes with probable subsegmental atelectasis in the left base, x-ray of his left great toe no evidence of acute fracture or dislocation. Query viral colitis.  He remained afebrile and  Lactic acid trended down with IV fluids. Recieved Vanc and cefepime.  No leukocytosis.  CT of the abdomen shows wall thickening involving sigmoid colon and mild thickening involving several loops of mid small bowel not resulting in enteric obstruction.  Findings nonspecific though could be setting of enteritis. c diff negative. GI pathogen panel pending.  LFTs  within the limits of normal.  Evaluated by gastroenterology who recommended colonoscopy if GI pathogen panel was negative.  Patient declines adamantly a colonoscopy.  Diarrhea resolved at discharge and tolerating full liquids.  Recommend follow-up with primary care provider in 1 to 2 weeks for evaluation of symptoms  #2.  Nausea/vomiting/diarrhea.  Nausea and vomiting improved at discharge.  See #1.    #3.  Acute kidney injury.  Resolved at discharge. Likely related to  above.  #4.  Postural dizziness.  Resolved at discharge.  Related to dehydration/volume depletion in the setting of persistent diarrhea and decreased oral intake.  #5.  Altered mental status.  Patient at baseline at discharge. Wife reported intermittent confusion.  At discharge patient is alert and oriented x3.  Folate B12 RPR within the limits of normal.  He is declining a colonoscopy tolerating his diet requesting discharge.  Discussed with wife who is ready to take patient home.  #6.  Diabetes type 2.  Hemoglobin A1c greater than 7.  Serum glucose 312 on admission.  Home medications include Metformin.  #7.  Hypocalcemia -Repleted  #8.  Hypothyroidism.  TSH greater than 6.  Free T4 within the limits of normal.  Chart review indicates his Synthroid was recently increased to 150 MCG's.  Continue this at discharge.  Recommend outpatient follow-up 4 to 6 weeks.  #9.  Anxiety.  Chart review indicates 2 years ago patient was taken Valium 4 times a day.  Had a propensity for panic attacks.  Remained at baseline.  Home medications include Valium at bedtime.  #10.  Acquired deformity of the toe.  Left great toe with erythema swelling.  Patient reported he has been "picking at it with a knife."  X-ray no acute abnormality.  Chart review indicates he has had nails from both great toes removed in the past.  Outpatient follow-up  #11.  Thrombocytopenia.  Chart review indicates history of same however current platelet level of 70 is below his baseline which appears to be around 130.  No sign symptoms of bleeding. Of note he finished treatment for prostate cancer august 2020   Medical Consultants:   [facility]   Discharge Exam:   Vitals:   07/07/19 2225 07/08/19 0808  BP: 138/73 139/73  Pulse:  85  Resp:  16  Temp: 98.4 F (36.9 C) 97.7 F (36.5 C)  SpO2: 97% 97%   Vitals:   07/07/19 0836 07/07/19 1700 07/07/19 2225 07/08/19 0808  BP: (!) 148/70 135/69 138/73 139/73  Pulse: 92 86  85    Resp: 16   16  Temp: 98.2 F (36.8 C) 97.7 F (36.5 C) 98.4 F (36.9 C) 97.7 F (36.5 C)  TempSrc:  Oral Oral   SpO2: 97% 98% 97% 97%  Weight:      Height:        General exam: Appears calm and comfortable.  Respiratory system: Clear to auscultation. Respiratory effort normal. Cardiovascular system: S1 & S2 heard, RRR. No JVD,  rubs, gallops or clicks. No murmurs. Gastrointestinal system: Abdomen is nondistended, soft and nontender. No organomegaly or masses felt. Normal bowel sounds heard. Central nervous system: Alert and oriented. No focal neurological deficits. Extremities: No clubbing,  or cyanosis. No edema. Skin: No rashes, lesions or ulcers. Psychiatry: Judgement and insight appear normal. Mood & affect appropriate.    The results of significant diagnostics from this hospitalization (including imaging, microbiology, ancillary and laboratory) are listed below for reference.     Procedures and Diagnostic Studies:  CT Head Wo Contrast  Result Date: 07/06/2019 CLINICAL DATA:  Altered mental status. EXAM: CT HEAD WITHOUT CONTRAST TECHNIQUE: Contiguous axial images were obtained from the base of the skull through the vertex without intravenous contrast. COMPARISON:  None. FINDINGS: Brain: Atrophy with sulcal prominence and centralized atrophy with commensurate ex vacuo dilatation of the ventricular system. Scattered periventricular hypodensities compatible with microvascular ischemic disease. No CT evidence of acute large territory infarct. No intraparenchymal or extra-axial mass or hemorrhage. Normal configuration of the ventricles and the basilar cisterns. No midline shift. Vascular: Intracranial atherosclerosis. Skull: No displaced calvarial fracture. Sinuses/Orbits: There is underpneumatization the bilateral frontal sinuses. The remaining paranasal sinuses and mastoid air cells is normal. No air-fluid levels. Other: Regional soft tissues appear normal. IMPRESSION: Mild  microvascular ischemic disease and advanced atrophy without superimposed acute intracranial process. Electronically Signed   By: Sandi Mariscal M.D.   On: 07/06/2019 08:33   CT Chest W Contrast  Result Date: 07/06/2019 CLINICAL DATA:  Multiple falls over the past several months. History of prostate cancer. EXAM: CT CHEST, ABDOMEN, AND PELVIS WITH CONTRAST TECHNIQUE: Multidetector CT imaging of the chest, abdomen and pelvis was performed following the standard protocol during bolus administration of intravenous contrast. CONTRAST:  120mL OMNIPAQUE IOHEXOL 300 MG/ML  SOLN COMPARISON:  CT abdomen pelvis-06/26/2018; 09/11/2011 FINDINGS: CT CHEST FINDINGS Cardiovascular: Normal heart size. Post median sternotomy and CABG with calcifications within the native coronary arteries. No pericardial effusion. Normal caliber of the thoracic aorta. No evidence of thoracic aortic dissection or periaortic stranding on this nongated examination. The left vertebral artery is incidentally noted to arise directly from the aortic arch. The branch vessels of the aortic arch appear patent throughout their imaged courses. Although this examination was not tailored for the evaluation the pulmonary arteries, there are no discrete filling defects within the central pulmonary arterial tree to suggest central pulmonary embolism. Normal caliber the main pulmonary artery. Mediastinum/Nodes: No bulky mediastinal, hilar or axillary lymphadenopathy. Lungs/Pleura: There is a punctate (approximate 0.5 cm) subpleural nodule within right upper lobe (image 46, series 7 as well as a smaller (approximately 0.3 cm) subpleural nodule within the left upper lobe (image 59, series 7). Subsegmental atelectasis within the bilateral lower lobes, left greater than right as well as about the anterior aspect of the right minor fissure and within the lingula. No discrete focal airspace opacities. No pleural effusion or pneumothorax. The central pulmonary airways appear  widely patent. Musculoskeletal: No acute or aggressive osseous abnormalities. Mild symmetric gynecomastia. Regional soft tissues appear otherwise normal. The thyroid gland appears atrophic. _________________________________________________________ CT ABDOMEN PELVIS FINDINGS Hepatobiliary: There is diffuse decreased attenuation of the hepatic parenchyma on this postcontrast examination suggestive of hepatic steatosis. Punctate (subcentimeter) hypoattenuating lesions within the right lobe of the liver (image 62, series 6) as well as the caudate (image 62, series 6), are too small to adequately characterize though unchanged compared to the 06/2018 examination and favored to represent hepatic cysts. Post cholecystectomy. No intra or extrahepatic biliary ductal dilatation. No ascites. Pancreas: Normal appearance of the pancreas. Spleen: Normal appearance of the spleen. Adrenals/Urinary Tract: There is symmetric enhancement and excretion of the bilateral kidneys. Solitary punctate (2 mm) nonobstructing renal stones are seen bilaterally (right coronal image 104, series 8; left coronal image 87, series 8). No discrete renal lesions. There is a minimal amount of grossly symmetric bilateral perinephric stranding. No urine obstruction. There is mild thickening of the left adrenal gland without discrete nodule. Normal appearance of the right adrenal gland.  There is a approximately 0.6 cm stone lying dependently within the urinary bladder (image 116, series 6, similar to the 06/26/2018 examination. Mild thickening of the urinary bladder wall. Stomach/Bowel: There is circumferential wall thickening involving the sigmoid colon to the level of the rectum (representative images 102 and 107, series 6), with associated minimal amount of fluid in the pelvic cul-de-sac but without definable/drainable fluid collection (sagittal image 99, series 9). This finding is associated with mild patulous distension of the upstream colon, not  definitely resulting in enteric obstruction. Circumferential wall thickening involving several loops of mid jejunum (representative image 52 and 63, series 6), similar to the 06/2018 examination though new compared to the 08/2011 examination. Normal appearance of the terminal ileum and the retrocecal appendix. No pneumoperitoneum, pneumatosis or portal venous gas. Vascular/Lymphatic: There is a moderate to large amount of eccentric mixed calcified and noncalcified atherosclerotic plaque throughout the normal caliber abdominal aorta. Moderate to large amount calcified atherosclerotic plaque involving the right common iliac artery approaches 50% luminal narrowing (image 93, series 6, similar to the 2013 examination. No bulky mediastinal, hilar or axillary lymphadenopathy. 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 Other: There is a minimal amount of ill-defined subcutaneous edema about the left flank. Regional soft tissues appear otherwise normal. Musculoskeletal: No acute or aggressive osseous abnormalities within the abdomen or pelvis. Mild multilevel lumbar spine DDD, worse at L3-L4 and L4-L5 with disc space height loss, endplate irregularity and sclerosis. IMPRESSION: Chest CT impression: 1. No acute cardiopulmonary disease. 2. Post median sternotomy and CABG. Calcifications within native coronary arteries. Aortic Atherosclerosis (ICD10-I70.0). 3. Bilateral punctate pulmonary nodules, largest of which within the right upper lobe measures 0.5 cm in diameter. No follow-up needed if patient is low-risk (and has no known or suspected primary neoplasm). Non-contrast chest CT can be considered in 12 months if patient is high-risk. This recommendation follows the consensus statement: Guidelines for Management of Incidental Pulmonary Nodules Detected on CT Images: From the Fleischner Society 2017; Radiology 2017; 284:228-243.  Abdomen and pelvic CT impression: 1. Apparent circumferential wall thickening involving the sigmoid colon extending with patulous distension of the upstream colon, though not resulting in enteric obstruction. Findings are nonspecific though could be seen in the setting of an enteritis. Further evaluation with colonoscopy could be performed as indicated. 2. Mild circumferential wall thickening involving several loops of mid small bowel, not resulting in enteric obstruction, similar to the 06/2018 examination while potentially secondary to peristalsis though additional location of an enteritis could have a similar appearance. 3. Redemonstrated 0.6 cm urinary bladder calculus, similar to the 06/2018 examination. 4. Redemonstrated solitary punctate (2 mm) nonobstructing bilateral renal stones. 5. Suspected hepatic steatosis.  Correlation with LFTs is advised. Electronically Signed   By: Sandi Mariscal M.D.   On: 07/06/2019 09:02   CT Cervical Spine Wo Contrast  Result Date: 07/06/2019 CLINICAL DATA:  Dizziness, multiple falls, history of prostate cancer EXAM: CT CERVICAL SPINE WITHOUT CONTRAST TECHNIQUE: Multidetector CT imaging of the cervical spine was performed without intravenous contrast. Multiplanar CT image reconstructions were also generated. COMPARISON:  None. FINDINGS: Alignment: Anteroposterior alignment is maintained. Skull base and vertebrae: Vertebral body heights are preserved. There is no suspicious osseous lesion. No acute fracture. Soft tissues and spinal canal: No prevertebral fluid or swelling. No visible canal hematoma. Disc levels:  There is no significant degenerative stenosis. Upper chest: Negative. Other: None. IMPRESSION: No significant or acute osseous abnormality.  Electronically Signed   By: Macy Mis M.D.   On: 07/06/2019 08:45   CT ABDOMEN PELVIS W CONTRAST  Result Date: 07/06/2019 CLINICAL DATA:  Multiple falls over the past several months. History of prostate cancer. EXAM: CT  CHEST, ABDOMEN, AND PELVIS WITH CONTRAST TECHNIQUE: Multidetector CT imaging of the chest, abdomen and pelvis was performed following the standard protocol during bolus administration of intravenous contrast. CONTRAST:  149mL OMNIPAQUE IOHEXOL 300 MG/ML  SOLN COMPARISON:  CT abdomen pelvis-06/26/2018; 09/11/2011 FINDINGS: CT CHEST FINDINGS Cardiovascular: Normal heart size. Post median sternotomy and CABG with calcifications within the native coronary arteries. No pericardial effusion. Normal caliber of the thoracic aorta. No evidence of thoracic aortic dissection or periaortic stranding on this nongated examination. The left vertebral artery is incidentally noted to arise directly from the aortic arch. The branch vessels of the aortic arch appear patent throughout their imaged courses. Although this examination was not tailored for the evaluation the pulmonary arteries, there are no discrete filling defects within the central pulmonary arterial tree to suggest central pulmonary embolism. Normal caliber the main pulmonary artery. Mediastinum/Nodes: No bulky mediastinal, hilar or axillary lymphadenopathy. Lungs/Pleura: There is a punctate (approximate 0.5 cm) subpleural nodule within right upper lobe (image 46, series 7 as well as a smaller (approximately 0.3 cm) subpleural nodule within the left upper lobe (image 59, series 7). Subsegmental atelectasis within the bilateral lower lobes, left greater than right as well as about the anterior aspect of the right minor fissure and within the lingula. No discrete focal airspace opacities. No pleural effusion or pneumothorax. The central pulmonary airways appear widely patent. Musculoskeletal: No acute or aggressive osseous abnormalities. Mild symmetric gynecomastia. Regional soft tissues appear otherwise normal. The thyroid gland appears atrophic. _________________________________________________________ CT ABDOMEN PELVIS FINDINGS Hepatobiliary: There is diffuse decreased  attenuation of the hepatic parenchyma on this postcontrast examination suggestive of hepatic steatosis. Punctate (subcentimeter) hypoattenuating lesions within the right lobe of the liver (image 62, series 6) as well as the caudate (image 62, series 6), are too small to adequately characterize though unchanged compared to the 06/2018 examination and favored to represent hepatic cysts. Post cholecystectomy. No intra or extrahepatic biliary ductal dilatation. No ascites. Pancreas: Normal appearance of the pancreas. Spleen: Normal appearance of the spleen. Adrenals/Urinary Tract: There is symmetric enhancement and excretion of the bilateral kidneys. Solitary punctate (2 mm) nonobstructing renal stones are seen bilaterally (right coronal image 104, series 8; left coronal image 87, series 8). No discrete renal lesions. There is a minimal amount of grossly symmetric bilateral perinephric stranding. No urine obstruction. There is mild thickening of the left adrenal gland without discrete nodule. Normal appearance of the right adrenal gland. There is a approximately 0.6 cm stone lying dependently within the urinary bladder (image 116, series 6, similar to the 06/26/2018 examination. Mild thickening of the urinary bladder wall. Stomach/Bowel: There is circumferential wall thickening involving the sigmoid colon to the level of the rectum (representative images 102 and 107, series 6), with associated minimal amount of fluid in the pelvic cul-de-sac but without definable/drainable fluid collection (sagittal image 99, series 9). This finding is associated with mild patulous distension of the upstream colon, not definitely resulting in enteric obstruction. Circumferential wall thickening involving several loops of mid jejunum (representative image 52 and 63, series 6), similar to the 06/2018 examination though new compared to the 08/2011 examination. Normal appearance of the terminal ileum and the retrocecal appendix. No  pneumoperitoneum, pneumatosis or portal venous gas. Vascular/Lymphatic: There is a  moderate to large amount of eccentric mixed calcified and noncalcified atherosclerotic plaque throughout the normal caliber abdominal aorta. Moderate to large amount calcified atherosclerotic plaque involving the right common iliac artery approaches 50% luminal narrowing (image 93, series 6, similar to the 2013 examination. No bulky mediastinal, hilar or axillary lymphadenopathy. 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 Other: There is a minimal amount of ill-defined subcutaneous edema about the left flank. Regional soft tissues appear otherwise normal. Musculoskeletal: No acute or aggressive osseous abnormalities within the abdomen or pelvis. Mild multilevel lumbar spine DDD, worse at L3-L4 and L4-L5 with disc space height loss, endplate irregularity and sclerosis. IMPRESSION: Chest CT impression: 1. No acute cardiopulmonary disease. 2. Post median sternotomy and CABG. Calcifications within native coronary arteries. Aortic Atherosclerosis (ICD10-I70.0). 3. Bilateral punctate pulmonary nodules, largest of which within the right upper lobe measures 0.5 cm in diameter. No follow-up needed if patient is low-risk (and has no known or suspected primary neoplasm). Non-contrast chest CT can be considered in 12 months if patient is high-risk. This recommendation follows the consensus statement: Guidelines for Management of Incidental Pulmonary Nodules Detected on CT Images: From the Fleischner Society 2017; Radiology 2017; 284:228-243. Abdomen and pelvic CT impression: 1. Apparent circumferential wall thickening involving the sigmoid colon extending with patulous distension of the upstream colon, though not resulting in enteric obstruction. Findings are nonspecific though could be seen in the setting of an enteritis. Further evaluation with  colonoscopy could be performed as indicated. 2. Mild circumferential wall thickening involving several loops of mid small bowel, not resulting in enteric obstruction, similar to the 06/2018 examination while potentially secondary to peristalsis though additional location of an enteritis could have a similar appearance. 3. Redemonstrated 0.6 cm urinary bladder calculus, similar to the 06/2018 examination. 4. Redemonstrated solitary punctate (2 mm) nonobstructing bilateral renal stones. 5. Suspected hepatic steatosis.  Correlation with LFTs is advised. Electronically Signed   By: Sandi Mariscal M.D.   On: 07/06/2019 09:02   DG Chest Portable 1 View  Result Date: 07/06/2019 CLINICAL DATA:  69 year old male with history of trauma from a fall. Evaluate for potential pneumonia. EXAM: PORTABLE CHEST 1 VIEW COMPARISON:  Chest x-ray 06/13/2019. FINDINGS: Lung volumes are low. Linear opacities in the left base may reflect areas of subsegmental atelectasis or scarring. No consolidative airspace disease. No pleural effusions. No pneumothorax. No pulmonary nodule or mass noted. Pulmonary vasculature and the cardiomediastinal silhouette are within normal limits. Status post median sternotomy for CABG. IMPRESSION: 1. Low lung volumes with probable subsegmental atelectasis in the left base. 2. Postoperative changes, as above. Electronically Signed   By: Vinnie Langton M.D.   On: 07/06/2019 07:30   DG Toe Great Left  Result Date: 07/06/2019 CLINICAL DATA:  Pain and swelling involving the LEFT great toe. No known injuries. EXAM: LEFT GREAT TOE-3 VIEWS COMPARISON:  LEFT foot x-rays 12/31/2018. FINDINGS: No evidence of acute fracture or dislocation. Joint spaces well preserved. Well-preserved bone mineral density. No intrinsic osseous abnormalities. IMPRESSION: Normal examination. Electronically Signed   By: Evangeline Dakin M.D.   On: 07/06/2019 10:43     Labs:   Basic Metabolic Panel: Recent Labs  Lab 07/06/19 0630  07/06/19 0630 07/06/19 0727 07/06/19 0727 07/07/19 0249 07/08/19 0212  NA 137  --  136  --  140 140  K 3.9   < > 4.1   < > 3.5 3.0*  CL 100  --  100  --  105 105  CO2 22  --   --   --  24 23  GLUCOSE 312*  --  270*  --  159* 147*  BUN 17  --  19  --  11 9  CREATININE 1.42*  --  1.20  --  1.02 0.97  CALCIUM 8.3*  --   --   --  7.7* 7.6*  MG  --   --   --   --   --  1.0*   < > = values in this interval not displayed.   GFR Estimated Creatinine Clearance: 75.3 mL/min (by C-G formula based on SCr of 0.97 mg/dL). Liver Function Tests: Recent Labs  Lab 07/06/19 0630 07/08/19 0212  AST 60* 28  ALT 36 24  ALKPHOS 102 82  BILITOT 0.8 0.8  PROT 6.7 5.9*  ALBUMIN 3.5 3.0*   Recent Labs  Lab 07/06/19 0630  LIPASE 25   Recent Labs  Lab 07/06/19 0630  AMMONIA 39*   Coagulation profile Recent Labs  Lab 07/06/19 0630 07/07/19 0249  INR 1.2 1.3*    CBC: Recent Labs  Lab 07/06/19 0630 07/06/19 0727 07/07/19 0249 07/08/19 0212  WBC 5.5  --  3.8* 3.5*  NEUTROABS 4.4  --   --   --   HGB 12.9* 13.6 10.3* 10.0*  HCT 40.1 40.0 31.3* 30.0*  MCV 91.3  --  90.5 89.3  PLT 95*  --  69* 70*   Cardiac Enzymes: No results for input(s): CKTOTAL, CKMB, CKMBINDEX, TROPONINI in the last 168 hours. BNP: Invalid input(s): POCBNP CBG: Recent Labs  Lab 07/07/19 0833 07/07/19 1201 07/07/19 1717 07/07/19 2030 07/08/19 0805  GLUCAP 148* 172* 117* 128* 141*   D-Dimer No results for input(s): DDIMER in the last 72 hours. Hgb A1c Recent Labs    07/06/19 1606  HGBA1C 7.2*   Lipid Profile No results for input(s): CHOL, HDL, LDLCALC, TRIG, CHOLHDL, LDLDIRECT in the last 72 hours. Thyroid function studies Recent Labs    07/06/19 0748  TSH 6.509*   Anemia work up Recent Labs    07/07/19 0249  [id number]   Microbiology Recent Results (from the past 240 hour(s))  Urine culture     Status: None   Collection Time: 07/06/19  6:47 AM   Specimen: In/Out Cath Urine    Result Value Ref Range Status   Specimen Description IN/OUT CATH URINE  Final   Special Requests NONE  Final   Culture   Final    NO GROWTH Performed at [facility], [address], [place], [place] [address]    Report Status 07/07/2019 FINAL  Final  Blood culture (routine x 2)     Status: None (Preliminary result)   Collection Time: 07/06/19  7:13 AM   Specimen: BLOOD  Result Value Ref Range Status   Specimen Description BLOOD RIGHT ANTECUBITAL  Final   Special Requests   Final    BOTTLES DRAWN AEROBIC AND ANAEROBIC Blood Culture adequate volume   Culture   Final    NO GROWTH < 24 HOURS Performed at [facility], [place] [address], [place],  [address]    Report Status PENDING  Incomplete  Blood culture (routine x 2)     Status: None (Preliminary result)   Collection Time: 07/06/19  7:24 AM   Specimen: BLOOD  Result Value Ref Range Status   Specimen Description BLOOD LEFT ANTECUBITAL  Final   Special Requests  Final    BOTTLES DRAWN AEROBIC AND ANAEROBIC Blood Culture results may not be optimal due to an inadequate volume of blood received in culture bottles   Culture   Final    NO GROWTH < 24 HOURS Performed at [facility] [address] [place], [place] [address]    Report Status PENDING  Incomplete  SARS CORONAVIRUS 2 (TAT 6-24 HRS) Nasopharyngeal Nasopharyngeal Swab     Status: None   Collection Time: 07/06/19 10:11 AM   Specimen: Nasopharyngeal Swab  Result Value Ref Range Status   SARS Coronavirus 2 NEGATIVE NEGATIVE Final    Comment: (NOTE) SARS-CoV-2 target nucleic acids are NOT DETECTED. The SARS-CoV-2 RNA is generally detectable in upper and lower respiratory specimens during the acute phase of infection. Negative results do not preclude SARS-CoV-2 infection, do not rule out co-infections with other pathogens, and should not be used as the sole basis for treatment or other patient management decisions. Negative results must be  combined with clinical observations, patient history, and epidemiological information. The expected result is Negative. Fact Sheet for Patients: SugarRoll.be Fact Sheet for Healthcare Providers: https://www.woods-mathews.com/ This test is not yet approved or cleared by the [place] FDA and  has been authorized for detection and/or diagnosis of SARS-CoV-2 by FDA under an Emergency Use Authorization (EUA). This EUA will remain  in effect (meaning this test can be used) for the duration of the COVID-19 declaration under Section 56 4(b)(1) of the Act, 21 U.S.C. section 360bbb-3(b)(1), unless the authorization is terminated or revoked sooner. Performed at [facility], [place] [address] [place], [place] [address]   C difficile quick scan w PCR reflex     Status: None   Collection Time: 07/07/19 10:33 AM   Specimen: STOOL  Result Value Ref Range Status   C Diff antigen NEGATIVE NEGATIVE Final   C Diff toxin NEGATIVE NEGATIVE Final   C Diff interpretation No C. difficile detected.  Final    Comment: Performed at [facility], [place] [address] [place], [place] [address]     Discharge Instructions:   Discharge Instructions    Call MD for:  persistant dizziness or light-headedness   Complete by: As directed    Call MD for:  severe uncontrolled pain   Complete by: As directed    Call MD for:  temperature >100.4   Complete by: As directed    Diet - low sodium heart healthy   Complete by: As directed    Discharge instructions   Complete by: As directed    Follow up with PCP 1-2 weeks for evaluation of persistent diarrhea Follow up with PCP 4-6 weeks for evaluation of TSH   Increase activity slowly   Complete by: As directed      Allergies as of 07/08/2019      Reactions   Eggs Or Egg-derived Products Other (See Comments)   Bloating, nausea, and no rash    Adhesive [tape] Swelling      Medication List    STOP taking these  medications   doxycycline 100 MG capsule Commonly known as: VIBRAMYCIN     TAKE these medications   ARIPiprazole 5 MG tablet Commonly known as: ABILIFY Take 7.5 mg by mouth in the morning and at bedtime.   aspirin EC 81 MG tablet Take 1 tablet (81 mg total) by mouth daily.   clotrimazole-betamethasone cream Commonly known as: LOTRISONE APPLY TO AFFECTED AREA TWICE A DAY   diazepam 5 MG  tablet Commonly known as: VALIUM Take 1 tablet (5 mg total) by mouth at bedtime as needed for anxiety. What changed: See the new instructions.   ENSURE PO Take 1 Bottle by mouth in the morning, at noon, and at bedtime.   gabapentin 100 MG capsule Commonly known as: NEURONTIN Take 1 capsule (100 mg total) by mouth 3 (three) times daily.   imipramine 25 MG tablet Commonly known as: TOFRANIL Take 75-100 mg by mouth 2 (two) times daily.   levothyroxine 150 MCG tablet Commonly known as: SYNTHROID Take 150 mcg by mouth daily.   magnesium oxide 400 (241.3 Mg) MG tablet Commonly known as: MAG-OX Take 1 tablet (400 mg total) by mouth daily.   metFORMIN 500 MG tablet Commonly known as: GLUCOPHAGE Take 1 tablet (500 mg total) by mouth 2 (two) times daily with a meal.   metoprolol succinate 25 MG 24 hr tablet Commonly known as: TOPROL-XL Take 25 mg by mouth in the morning and at bedtime.   mupirocin ointment 2 % Commonly known as: BACTROBAN Apply 1 application topically 2 (two) times daily.   Myrbetriq 25 MG Tb24 tablet Generic drug: mirabegron ER TAKE 1 TABLET BY MOUTH EVERY DAY What changed: how much to take   Nitrostat 0.4 MG SL tablet Generic drug: nitroGLYCERIN Place 0.4 mg under the tongue every 5 (five) minutes as needed for chest pain. Reported on 10/26/2015   pentazocine-naloxone 50-0.5 MG tablet Commonly known as: TALWIN NX Take 1 tablet by mouth every 4 (four) hours as needed for moderate pain.   phenazopyridine 200 MG tablet Commonly known as: Pyridium Take 1 tablet  (200 mg total) by mouth 3 (three) times daily as needed for pain.   pravastatin 10 MG tablet Commonly known as: PRAVACHOL Take 1 tablet by mouth daily.   simvastatin 20 MG tablet Commonly known as: ZOCOR TAKE ONE TABLET BY MOUTH ONCE DAILY FOR CHOLESTEROL What changed: See the new instructions.   tamsulosin 0.4 MG Caps capsule Commonly known as: FLOMAX Take 0.4 mg by mouth 2 (two) times daily.   VITAMIN D3 PO Take 2 tablets by mouth daily.         Time coordinating discharge: 45 minutes  Signed:  Radene Gunning NP  [facility] 07/08/2019, 11:31 AM

## 2019-07-10 LAB — GI PATHOGEN PANEL BY PCR, STOOL

## 2019-07-11 LAB — CULTURE, BLOOD (ROUTINE X 2)
Culture: NO GROWTH
Culture: NO GROWTH
Special Requests: ADEQUATE

## 2019-07-28 ENCOUNTER — Other Ambulatory Visit: Payer: Self-pay | Admitting: Urology

## 2019-08-05 DIAGNOSIS — Z7189 Other specified counseling: Secondary | ICD-10-CM | POA: Insufficient documentation

## 2019-08-05 NOTE — Progress Notes (Signed)
Cardiology Office Note   Date:  08/06/2019   ID:  Dean Cole, DOB 01-02-1951, MRN [id number]  PCP:  [name], MD  Cardiologist:   Mertie Moores, MD   Chief Complaint  Patient presents with  . Coronary Artery Disease      History of Present Illness: Dean Cole is a 69 y.o. male who presents for follow up of CAD.  This is my first visit with him.  He was seen previously by Dr. Mare Ferrari and Wynonia Lawman.  Since that visit he was in the hospital with sepsis of unknown source.  I don't see any cardiac issues during that admission.    He was also in the ED in Feb with chest pain not thought to be cardiac.  I reviewed these records also for this visit and he had negative enzymes and no acute EKG changes.    He comes today and he is obviously very confused.  Has had weight loss.  Has had decreased appetite.  His wife says that he is eating more memory issues.  I did look at the CT and he had advanced atrophy on his head CT when he was in the hospital.  He is having problems with balance and has had some falls.  There was an episode of bradycardia last night of his heart rate going fast but he does not remember the details of this.  This does not happen very frequently.  There was not any presyncope or syncope.  He is not describing chest pressure, neck or arm discomfort.  He is not describing new shortness of breath, PND or orthopnea.  I do notice that he has had a markedly elevated TSH and had adjustment a few months ago for his Synthroid.    Past Medical History:  Diagnosis Date  . Anxiety   . Cancer ([place])    skin cancer  basel cell carcinoma  . Cholecystitis   . Controlled type 2 DM with peripheral circulatory disorder ([place]) 09/20/2014  . Coronary artery disease   . Depression   . Headache(784.0)   . HTN (hypertension)   . Hx of skin cancer, basal cell   . Hyperlipidemia   . Hypothyroidism   . PAD (peripheral artery disease) ([place]) 09/20/2014  . Prostate cancer ([place])    . Restless leg syndrome   . Skin cancer    melanoma    Past Surgical History:  Procedure Laterality Date  . BASAL CELL CARCINOMA EXCISION  2000   Dr,Arkin   . CARDIAC CATHETERIZATION  07/07/2011  . CHOLECYSTECTOMY  09/14/2011   Procedure: LAPAROSCOPIC CHOLECYSTECTOMY WITH INTRAOPERATIVE CHOLANGIOGRAM;  Surgeon: Gwenyth Ober, MD;  Location: [facility];  Service: General;  Laterality: N/A;  . CORONARY ARTERY BYPASS GRAFT  07/10/2011   Procedure: CORONARY ARTERY BYPASS GRAFTING (CABG);  Surgeon: Ivin Poot, MD;  Location: [facility];  Service: Open Heart Surgery;  Laterality: N/A;  . EXCISIONAL HEMORRHOIDECTOMY  1997  . HEMORROIDECTOMY  2000  . LEFT HEART CATHETERIZATION WITH CORONARY ANGIOGRAM N/A 07/07/2011   Procedure: LEFT HEART CATHETERIZATION WITH CORONARY ANGIOGRAM;  Surgeon: Burnell Blanks, MD;  [facility];  Service: Cardiovascular;  Laterality: N/A;     Current Outpatient Medications  Medication Sig Dispense Refill  . aspirin EC 81 MG tablet Take 1 tablet (81 mg total) by mouth daily.    . Cholecalciferol (VITAMIN D3 PO) Take 2 tablets by mouth daily.    . clotrimazole-betamethasone (LOTRISONE) cream APPLY TO AFFECTED  AREA TWICE A DAY 30 g 0  . diazepam (VALIUM) 5 MG tablet Take 1 tablet (5 mg total) by mouth at bedtime as needed for anxiety. 30 tablet 0  . gabapentin (NEURONTIN) 100 MG capsule Take 1 capsule (100 mg total) by mouth 3 (three) times daily. 90 capsule 3  . imipramine (TOFRANIL) 25 MG tablet Take 75-100 mg by mouth 2 (two) times daily.     Marland Kitchen levothyroxine (SYNTHROID) 150 MCG tablet Take 150 mcg by mouth daily.     . magnesium oxide (MAG-OX) 400 (241.3 Mg) MG tablet Take 1 tablet (400 mg total) by mouth daily. 30 tablet 0  . metFORMIN (GLUCOPHAGE) 500 MG tablet Take 1 tablet (500 mg total) by mouth 2 (two) times daily with a meal. 60 tablet 2  . metoprolol succinate (TOPROL-XL) 25 MG 24 hr tablet Take 25 mg by mouth in the morning and at bedtime.     .  mupirocin ointment (BACTROBAN) 2 % Apply 1 application topically 2 (two) times daily. 30 g 2  . MYRBETRIQ 25 MG TB24 tablet TAKE 1 TABLET BY MOUTH EVERY DAY (Patient taking differently: Take 25 mg by mouth daily. ) 30 tablet 2  . NITROSTAT 0.4 MG SL tablet Place 0.4 mg under the tongue every 5 (five) minutes as needed for chest pain. Reported on 10/26/2015    . Nutritional Supplements (ENSURE PO) Take 1 Bottle by mouth in the morning, at noon, and at bedtime.    . pentazocine-naloxone (TALWIN NX) 50-0.5 MG tablet Take 1 tablet by mouth every 4 (four) hours as needed for moderate pain.    . phenazopyridine (PYRIDIUM) 200 MG tablet Take 1 tablet (200 mg total) by mouth 3 (three) times daily as needed for pain. 60 tablet 3  . pravastatin (PRAVACHOL) 10 MG tablet Take 1 tablet by mouth daily.    . simvastatin (ZOCOR) 20 MG tablet TAKE ONE TABLET BY MOUTH ONCE DAILY FOR CHOLESTEROL (Patient taking differently: Take 20 mg by mouth daily. Take one tablet by mouth once daily for cholesterol) 90 tablet 1  . tamsulosin (FLOMAX) 0.4 MG CAPS capsule Take 0.4 mg by mouth 2 (two) times daily.     No current facility-administered medications for this visit.    Allergies:   Eggs or egg-derived products and Adhesive [tape]   ROS:  Please see the history of present illness.   Otherwise, review of systems are positive for none.   All other systems are reviewed and negative.    PHYSICAL EXAM: VS:  BP 110/70   Pulse 76   Temp (!) 97.4 F (36.3 C)   Ht 5\' 10"  (1.778 m)   Wt 180 lb (81.6 kg)   BMI 25.83 kg/m  , BMI Body mass index is 25.83 kg/m. GENERAL:  Well appearing NECK:  No jugular venous distention, waveform within normal limits, carotid upstroke brisk and symmetric, no bruits, no thyromegaly LUNGS:  Clear to auscultation bilaterally BACK:  No CVA tenderness CHEST:  Well healed sternotomy scar. HEART:  PMI not displaced or sustained,S1 and S2 within normal limits, no S3, no S4, no clicks, no rubs, no  murmurs ABD:  Flat, positive bowel sounds normal in frequency in pitch, no bruits, no rebound, no guarding, no midline pulsatile mass, no hepatomegaly, no splenomegaly EXT:  2 plus pulses throughout, no edema, no cyanosis no clubbing   EKG:  EKG is not ordered today. The ekg ordered 07/06/2019 demonstrates sinus tachycardia, rate 112, nonspecific diffuse T wave changes.  Recent Labs: 07/06/2019: TSH 6.509 07/08/2019: ALT 24; BUN 9; Creatinine, Ser 0.97; Hemoglobin 10.0; Magnesium 1.0; Platelets 70; Potassium 3.0; Sodium 140    Lipid Panel    Component Value Date/Time   CHOL 166 04/17/2016 0928   CHOL 128 10/22/2015 0816   TRIG 242 (H) 04/17/2016 0928   HDL 34 (L) 04/17/2016 0928   HDL 30 (L) 10/22/2015 0816   CHOLHDL 4.9 04/17/2016 0928   VLDL 48 (H) 04/17/2016 0928   LDLCALC 84 04/17/2016 0928   LDLCALC 48 10/22/2015 0816      Wt Readings from Last 3 Encounters:  08/06/19 180 lb (81.6 kg)  07/06/19 187 lb (84.8 kg)  06/13/19 187 lb (84.8 kg)      Other studies Reviewed: Additional studies/ records that were reviewed today include: Hospital records as above. Review of the above records demonstrates:  Please see elsewhere in the note.     ASSESSMENT AND PLAN:  CAD:   I do not see any symptoms or signs of active ischemic or acute coronary syndrome.  He did not have any cardiac issues when he was very significantly sick in the hospital recently.  We are going to continue with secondary risk reduction.  DYSLIPIDEMIA: In the fall his direct LDL was 59.  His triglycerides were elevated.  No change in therapy.  HTN: Blood pressures well controlled.  No change in therapy.  BALANCE AND MEMORY: This is the most significant problem and I am going to get him a neurology appointment for follow-up.  HYPOTHYROID: He has had adjustment to his medications and should be soon getting repeat blood work.  I have asked the family to call and see when that is to be scheduled by his primary  provider.  COVID EDUCATION: We talked about Covid vaccine and she will get this.   Current medicines are reviewed at length with the patient today.  The patient does not have concerns regarding medicines.  The following changes have been made:  no change  Labs/ tests ordered today include: None  Orders Placed This Encounter  Procedures  . Ambulatory referral to Neurology     Disposition:   FU with me in six months.     Signed, Minus Breeding, MD  08/06/2019 12:54 PM    [facility]

## 2019-08-06 ENCOUNTER — Ambulatory Visit: Payer: Medicare HMO | Admitting: Cardiology

## 2019-08-06 ENCOUNTER — Other Ambulatory Visit: Payer: Self-pay

## 2019-08-06 ENCOUNTER — Telehealth: Payer: Self-pay | Admitting: Cardiology

## 2019-08-06 ENCOUNTER — Encounter: Payer: Self-pay | Admitting: Cardiology

## 2019-08-06 VITALS — BP 110/70 | HR 76 | Temp 97.4°F | Ht 70.0 in | Wt 180.0 lb

## 2019-08-06 DIAGNOSIS — Z7189 Other specified counseling: Secondary | ICD-10-CM | POA: Diagnosis not present

## 2019-08-06 DIAGNOSIS — I10 Essential (primary) hypertension: Secondary | ICD-10-CM | POA: Diagnosis not present

## 2019-08-06 DIAGNOSIS — R269 Unspecified abnormalities of gait and mobility: Secondary | ICD-10-CM

## 2019-08-06 DIAGNOSIS — E785 Hyperlipidemia, unspecified: Secondary | ICD-10-CM | POA: Diagnosis not present

## 2019-08-06 DIAGNOSIS — R413 Other amnesia: Secondary | ICD-10-CM

## 2019-08-06 DIAGNOSIS — I2510 Atherosclerotic heart disease of native coronary artery without angina pectoris: Secondary | ICD-10-CM

## 2019-08-06 NOTE — Telephone Encounter (Signed)
Patient's wife, Silva Bandy, is requesting to accompany the patient during appointment scheduled for today, 08/06/19 at 11:40 AM with Dr. Percival Spanish in [place], [place] due to the patient having issues with hearing. She states he needs her to interpret for him. Please route to [place] office.

## 2019-08-06 NOTE — Patient Instructions (Signed)
Medication Instructions:  The current medical regimen is effective;  continue present plan and medications.  *If you need a refill on your cardiac medications before your next appointment, please call your pharmacy*  You have been referred to neurology and will be contacted to be scheduled for this appointment.  Follow-Up: At [facility], you and your health needs are our priority.  As part of our continuing mission to provide you with exceptional heart care, we have created designated Provider Care Teams.  These Care Teams include your primary Cardiologist (physician) and Advanced Practice Providers (APPs -  Physician Assistants and Nurse Practitioners) who all work together to provide you with the care you need, when you need it.  We recommend signing up for the patient portal called "MyChart".  Sign up information is provided on this After Visit Summary.  MyChart is used to connect with patients for Virtual Visits (Telemedicine).  Patients are able to view lab/test results, encounter notes, upcoming appointments, etc.  Non-urgent messages can be sent to your provider as well.   To learn more about what you can do with MyChart, go to [email or web address].    Your next appointment:   6 month(s)  The format for your next appointment:   In Person  Provider:   Minus Breeding, MD   Thank you for choosing [facility]!!

## 2019-08-06 NOTE — Telephone Encounter (Signed)
Spoke with the patients wife, Silva Bandy who states she needs to come to the appt today with her husband because he has difficulty hearing and she interprets for him. Karren Burly that would be ok and I would make a note.

## 2019-08-07 ENCOUNTER — Encounter: Payer: Self-pay | Admitting: Neurology

## 2019-08-08 ENCOUNTER — Telehealth: Payer: Self-pay | Admitting: Cardiology

## 2019-08-08 ENCOUNTER — Encounter: Payer: Self-pay | Admitting: *Deleted

## 2019-08-08 DIAGNOSIS — R002 Palpitations: Secondary | ICD-10-CM

## 2019-08-08 NOTE — Telephone Encounter (Signed)
Pt c/o Shortness Of Breath: STAT if SOB developed within the last 24 hours or pt is noticeably SOB on the phone  1. Are you currently SOB (can you hear that pt is SOB on the phone)? no  2. How long have you been experiencing SOB? Since last week  3. Are you SOB when sitting or when up moving around? Moving around  4. Are you currently experiencing any other symptoms? Patient's wife states the patient was SOB yesterday on exertion after his appointment. She states he also gets fatigued and cold. She states they had discussed him getting a heart monitor and would like to speak with a nurse about it.

## 2019-08-08 NOTE — Telephone Encounter (Signed)
Spoke with wife who is aware monitor has been ordered and will be mailed to home address once he has been enrolled.  Address and insurance verified.  All questions answered at the time of the call.

## 2019-08-08 NOTE — Progress Notes (Signed)
Patient ID: Dean Cole, male   DOB: [birth year], 69 y.o.   MRN: [id number] Patient enrolled for [employer] to ship a 3 day ZIO XT long term holter monitor to his home.  Instructions are included in the monitor kit.

## 2019-08-08 NOTE — Telephone Encounter (Signed)
He did have some palpitations.  We could order a 48 monitor if he is still having this.  He did not recall this in detail.

## 2019-08-08 NOTE — Telephone Encounter (Signed)
Spoke with pt's wife per dpr. States that they saw Dr.H yesterday and he was told to "do some things at home and after doing these things he was out of breath." They were told that they could try a heart monitor yesterday. She states that the pt does not do much work, and that he recently had a staph infection and was in the hospital for 3 days when he should have stayed for 4. She states that the pt's brain has shrunk and the dementia has started.  She would like for her husband to have this heart monitor. Notified I would send this message to Dr.Hochrein to review. Wife verbalized understanding with no other questions at this time.

## 2019-08-12 ENCOUNTER — Ambulatory Visit (INDEPENDENT_AMBULATORY_CARE_PROVIDER_SITE_OTHER): Payer: Medicare HMO

## 2019-08-12 ENCOUNTER — Telehealth: Payer: Self-pay | Admitting: Cardiology

## 2019-08-12 DIAGNOSIS — R002 Palpitations: Secondary | ICD-10-CM

## 2019-08-12 NOTE — Telephone Encounter (Signed)
New Message    Pts wife is calling and says they received the monitor and need help with applying it     Please advise

## 2019-08-12 NOTE — Telephone Encounter (Signed)
Spoke with wife and provider overview of zio monitor application. She has a daughter that is a Marine scientist and suggested she seek assistance from her.

## 2019-08-13 ENCOUNTER — Encounter: Payer: Self-pay | Admitting: Gastroenterology

## 2019-08-25 ENCOUNTER — Ambulatory Visit: Payer: Medicare HMO | Admitting: Podiatry

## 2019-08-26 ENCOUNTER — Ambulatory Visit: Payer: Medicare HMO | Admitting: Orthotics

## 2019-08-26 ENCOUNTER — Ambulatory Visit: Payer: Medicare HMO | Admitting: Podiatry

## 2019-09-01 ENCOUNTER — Telehealth: Payer: Self-pay | Admitting: Nurse Practitioner

## 2019-09-04 ENCOUNTER — Telehealth: Payer: Self-pay | Admitting: Cardiology

## 2019-09-04 NOTE — Telephone Encounter (Signed)
Patient's wife calling back for heart monitor results. She states the patient does not remember what was discussed earlier.

## 2019-09-04 NOTE — Telephone Encounter (Signed)
Sent to primary nurse - sees MD in [place]

## 2019-09-05 NOTE — Telephone Encounter (Signed)
Shellia Cleverly, RN  09/05/2019 2:27 PM EDT    Pt very HOH and did not understand the results that were called to him yesterday. Reviewed results with wife who states understanding and will notify the patient.

## 2019-09-05 NOTE — Telephone Encounter (Signed)
Attempted to contact at # listed - NA and the mailbox is full - unable to leave a message.  Will attempt to contact pt again.

## 2019-09-05 NOTE — Telephone Encounter (Signed)
Minus Breeding, MD  09/03/2019 9:54 PM EDT    The monitor demonstrated no significant arrhythmias. No change in therapy. Call Mr. Martes with the results and send results to [name], MD

## 2019-09-07 ENCOUNTER — Other Ambulatory Visit: Payer: Self-pay | Admitting: Podiatry

## 2019-09-09 ENCOUNTER — Ambulatory Visit (INDEPENDENT_AMBULATORY_CARE_PROVIDER_SITE_OTHER): Payer: Medicare HMO | Admitting: Orthotics

## 2019-09-09 ENCOUNTER — Ambulatory Visit: Payer: Medicare HMO | Admitting: Podiatry

## 2019-09-09 ENCOUNTER — Other Ambulatory Visit: Payer: Self-pay

## 2019-09-09 DIAGNOSIS — M2042 Other hammer toe(s) (acquired), left foot: Secondary | ICD-10-CM | POA: Diagnosis not present

## 2019-09-09 DIAGNOSIS — B351 Tinea unguium: Secondary | ICD-10-CM

## 2019-09-09 DIAGNOSIS — M2041 Other hammer toe(s) (acquired), right foot: Secondary | ICD-10-CM

## 2019-09-09 DIAGNOSIS — M79675 Pain in left toe(s): Secondary | ICD-10-CM

## 2019-09-09 DIAGNOSIS — E1149 Type 2 diabetes mellitus with other diabetic neurological complication: Secondary | ICD-10-CM

## 2019-09-09 DIAGNOSIS — M79674 Pain in right toe(s): Secondary | ICD-10-CM

## 2019-09-09 DIAGNOSIS — Z872 Personal history of diseases of the skin and subcutaneous tissue: Secondary | ICD-10-CM | POA: Diagnosis not present

## 2019-09-09 NOTE — Progress Notes (Signed)

## 2019-09-10 ENCOUNTER — Ambulatory Visit: Payer: Medicare HMO | Admitting: Nurse Practitioner

## 2019-09-10 NOTE — Progress Notes (Signed)
Subjective: 69 year old male presents to the office for follow-up evaluation of neuropathy as well as for elongated tenderness that he cannot trim himself.  He said no ulcerations of the last saw him.  Still taking gabapentin.  Denies any systemic complaints such as fevers, chills, nausea, vomiting. No acute changes since last appointment, and no other complaints at this time.   Objective: AAO x3, NAD DP/PT pulses palpable bilaterally, CRT less than 3 seconds Sensation decreased with Semmes Weinstein monofilament No ulcerations were identified today. Hammertoe contractures are present. Nails are hypertrophic, dystrophic with yellow-brown discoloration and are causing irritation inside shoes.  No edema or erythema or any signs of infection. No pain with calf compression, swelling, warmth, erythema  Assessment: 69 year old male with neuropathy, symptomatic onychomycosis  Plan: -All treatment options discussed with the patient including all alternatives, risks, complications.  -Nails were sharply debrided x10 without any complications or bleeding -Continue current dose of gabapentin-discussed taking as prescribed. -Discussed the importance of the foot inspection  Return in about 3 months   Trula Slade DPM

## 2019-09-11 ENCOUNTER — Ambulatory Visit: Payer: Medicare HMO | Admitting: Gastroenterology

## 2019-09-18 ENCOUNTER — Encounter: Payer: Medicare HMO | Admitting: Gastroenterology

## 2019-09-26 ENCOUNTER — Telehealth: Payer: Self-pay | Admitting: Cardiology

## 2019-09-26 NOTE — Telephone Encounter (Signed)
   Pt's wife called , pt is having coloscopy and needs clearance. Advised his doctor needs to send clearance request. She understood

## 2019-10-04 ENCOUNTER — Emergency Department (HOSPITAL_COMMUNITY): Payer: Medicare HMO

## 2019-10-04 ENCOUNTER — Encounter (HOSPITAL_COMMUNITY): Payer: Self-pay

## 2019-10-04 ENCOUNTER — Observation Stay (HOSPITAL_COMMUNITY)
Admission: EM | Admit: 2019-10-04 | Discharge: 2019-10-05 | Disposition: A | Discharge status: Home / Self Care | Payer: Medicare HMO | Attending: Internal Medicine | Admitting: Internal Medicine

## 2019-10-04 ENCOUNTER — Other Ambulatory Visit: Payer: Self-pay

## 2019-10-04 DIAGNOSIS — E1140 Type 2 diabetes mellitus with diabetic neuropathy, unspecified: Secondary | ICD-10-CM | POA: Insufficient documentation

## 2019-10-04 DIAGNOSIS — E1165 Type 2 diabetes mellitus with hyperglycemia: Secondary | ICD-10-CM | POA: Diagnosis present

## 2019-10-04 DIAGNOSIS — C4491 Basal cell carcinoma of skin, unspecified: Secondary | ICD-10-CM | POA: Diagnosis present

## 2019-10-04 DIAGNOSIS — R3915 Urgency of urination: Secondary | ICD-10-CM | POA: Diagnosis not present

## 2019-10-04 DIAGNOSIS — I2510 Atherosclerotic heart disease of native coronary artery without angina pectoris: Secondary | ICD-10-CM | POA: Diagnosis not present

## 2019-10-04 DIAGNOSIS — E1151 Type 2 diabetes mellitus with diabetic peripheral angiopathy without gangrene: Secondary | ICD-10-CM | POA: Insufficient documentation

## 2019-10-04 DIAGNOSIS — R509 Fever, unspecified: Secondary | ICD-10-CM

## 2019-10-04 DIAGNOSIS — I119 Hypertensive heart disease without heart failure: Secondary | ICD-10-CM | POA: Diagnosis not present

## 2019-10-04 DIAGNOSIS — Z8042 Family history of malignant neoplasm of prostate: Secondary | ICD-10-CM | POA: Insufficient documentation

## 2019-10-04 DIAGNOSIS — Z85820 Personal history of malignant melanoma of skin: Secondary | ICD-10-CM | POA: Diagnosis not present

## 2019-10-04 DIAGNOSIS — N400 Enlarged prostate without lower urinary tract symptoms: Secondary | ICD-10-CM | POA: Insufficient documentation

## 2019-10-04 DIAGNOSIS — C61 Malignant neoplasm of prostate: Secondary | ICD-10-CM | POA: Insufficient documentation

## 2019-10-04 DIAGNOSIS — Z20822 Contact with and (suspected) exposure to covid-19: Secondary | ICD-10-CM | POA: Diagnosis not present

## 2019-10-04 DIAGNOSIS — Z9049 Acquired absence of other specified parts of digestive tract: Secondary | ICD-10-CM | POA: Insufficient documentation

## 2019-10-04 DIAGNOSIS — W19XXXA Unspecified fall, initial encounter: Secondary | ICD-10-CM | POA: Insufficient documentation

## 2019-10-04 DIAGNOSIS — Z806 Family history of leukemia: Secondary | ICD-10-CM | POA: Insufficient documentation

## 2019-10-04 DIAGNOSIS — F419 Anxiety disorder, unspecified: Secondary | ICD-10-CM | POA: Insufficient documentation

## 2019-10-04 DIAGNOSIS — E785 Hyperlipidemia, unspecified: Secondary | ICD-10-CM | POA: Insufficient documentation

## 2019-10-04 DIAGNOSIS — Z951 Presence of aortocoronary bypass graft: Secondary | ICD-10-CM | POA: Diagnosis not present

## 2019-10-04 DIAGNOSIS — Z8249 Family history of ischemic heart disease and other diseases of the circulatory system: Secondary | ICD-10-CM | POA: Insufficient documentation

## 2019-10-04 DIAGNOSIS — E039 Hypothyroidism, unspecified: Secondary | ICD-10-CM | POA: Insufficient documentation

## 2019-10-04 DIAGNOSIS — A419 Sepsis, unspecified organism: Secondary | ICD-10-CM | POA: Diagnosis present

## 2019-10-04 DIAGNOSIS — Z87891 Personal history of nicotine dependence: Secondary | ICD-10-CM | POA: Diagnosis not present

## 2019-10-04 DIAGNOSIS — Z91012 Allergy to eggs: Secondary | ICD-10-CM | POA: Insufficient documentation

## 2019-10-04 DIAGNOSIS — Z7982 Long term (current) use of aspirin: Secondary | ICD-10-CM | POA: Insufficient documentation

## 2019-10-04 DIAGNOSIS — R079 Chest pain, unspecified: Secondary | ICD-10-CM

## 2019-10-04 DIAGNOSIS — G9340 Encephalopathy, unspecified: Secondary | ICD-10-CM

## 2019-10-04 DIAGNOSIS — Z818 Family history of other mental and behavioral disorders: Secondary | ICD-10-CM | POA: Insufficient documentation

## 2019-10-04 DIAGNOSIS — I10 Essential (primary) hypertension: Secondary | ICD-10-CM | POA: Diagnosis not present

## 2019-10-04 DIAGNOSIS — G9341 Metabolic encephalopathy: Principal | ICD-10-CM | POA: Insufficient documentation

## 2019-10-04 DIAGNOSIS — F329 Major depressive disorder, single episode, unspecified: Secondary | ICD-10-CM | POA: Diagnosis not present

## 2019-10-04 DIAGNOSIS — Z79899 Other long term (current) drug therapy: Secondary | ICD-10-CM | POA: Insufficient documentation

## 2019-10-04 DIAGNOSIS — Z888 Allergy status to other drugs, medicaments and biological substances status: Secondary | ICD-10-CM | POA: Insufficient documentation

## 2019-10-04 DIAGNOSIS — J984 Other disorders of lung: Secondary | ICD-10-CM | POA: Diagnosis not present

## 2019-10-04 DIAGNOSIS — R300 Dysuria: Secondary | ICD-10-CM

## 2019-10-04 DIAGNOSIS — R269 Unspecified abnormalities of gait and mobility: Secondary | ICD-10-CM | POA: Diagnosis not present

## 2019-10-04 DIAGNOSIS — G2581 Restless legs syndrome: Secondary | ICD-10-CM | POA: Diagnosis not present

## 2019-10-04 DIAGNOSIS — Z794 Long term (current) use of insulin: Secondary | ICD-10-CM | POA: Insufficient documentation

## 2019-10-04 LAB — TROPONIN I (HIGH SENSITIVITY)
Troponin I (High Sensitivity): 2 ng/L (ref <18)
Troponin I (High Sensitivity): 3 ng/L (ref <18)

## 2019-10-04 LAB — CBC WITH DIFFERENTIAL/PLATELET
Abs Immature Granulocytes: 0.03 10*3/uL (ref 0.00–0.07)
Basophils Absolute: 0 10*3/uL (ref 0.0–0.1)
Basophils Relative: 0 %
Eosinophils Absolute: 0.1 10*3/uL (ref 0.0–0.5)
Eosinophils Relative: 1 %
HCT: 35.5 % — ABNORMAL LOW (ref 39.0–52.0)
Hemoglobin: 12 g/dL — ABNORMAL LOW (ref 13.0–17.0)
Immature Granulocytes: 0 %
Lymphocytes Relative: 25 %
Lymphs Abs: 1.7 10*3/uL (ref 0.7–4.0)
MCH: 29.3 pg (ref 26.0–34.0)
MCHC: 33.8 g/dL (ref 30.0–36.0)
MCV: 86.6 fL (ref 80.0–100.0)
Monocytes Absolute: 0.5 10*3/uL (ref 0.1–1.0)
Monocytes Relative: 7 %
Neutro Abs: 4.7 10*3/uL (ref 1.7–7.7)
Neutrophils Relative %: 67 %
Platelets: 100 10*3/uL — ABNORMAL LOW (ref 150–400)
RBC: 4.1 MIL/uL — ABNORMAL LOW (ref 4.22–5.81)
RDW: 14.5 % (ref 11.5–15.5)
WBC: 7.1 10*3/uL (ref 4.0–10.5)
nRBC: 0 % (ref 0.0–0.2)

## 2019-10-04 LAB — BASIC METABOLIC PANEL
Anion gap: 11 (ref 5–15)
BUN: 10 mg/dL (ref 8–23)
CO2: 29 mmol/L (ref 22–32)
Calcium: 9.1 mg/dL (ref 8.9–10.3)
Chloride: 97 mmol/L — ABNORMAL LOW (ref 98–111)
Creatinine, Ser: 1.06 mg/dL (ref 0.61–1.24)
GFR calc Af Amer: >60 mL/min (ref >60)
GFR calc non Af Amer: >60 mL/min (ref >60)
Glucose, Bld: 186 mg/dL — ABNORMAL HIGH (ref 70–99)
Potassium: 3.6 mmol/L (ref 3.5–5.1)
Sodium: 137 mmol/L (ref 135–145)

## 2019-10-04 LAB — URINALYSIS, ROUTINE W REFLEX MICROSCOPIC
Bilirubin Urine: NEGATIVE
Glucose, UA: NEGATIVE mg/dL
Hgb urine dipstick: NEGATIVE
Ketones, ur: NEGATIVE mg/dL
Leukocytes,Ua: NEGATIVE
Nitrite: NEGATIVE
Protein, ur: NEGATIVE mg/dL
Specific Gravity, Urine: 1.015 (ref 1.005–1.030)
pH: 5 (ref 5.0–8.0)

## 2019-10-04 LAB — CBC
HCT: 41.2 % (ref 39.0–52.0)
Hemoglobin: 13.7 g/dL (ref 13.0–17.0)
MCH: 29.1 pg (ref 26.0–34.0)
MCHC: 33.3 g/dL (ref 30.0–36.0)
MCV: 87.7 fL (ref 80.0–100.0)
Platelets: 123 10*3/uL — ABNORMAL LOW (ref 150–400)
RBC: 4.7 MIL/uL (ref 4.22–5.81)
RDW: 14.7 % (ref 11.5–15.5)
WBC: 8.4 10*3/uL (ref 4.0–10.5)
nRBC: 0 % (ref 0.0–0.2)

## 2019-10-04 LAB — LACTIC ACID, PLASMA: Lactic Acid, Venous: 2.3 mmol/L (ref 0.5–1.9)

## 2019-10-04 LAB — CBG MONITORING, ED: Glucose-Capillary: 176 mg/dL — ABNORMAL HIGH (ref 70–99)

## 2019-10-04 MED ORDER — SODIUM CHLORIDE 0.9 % IV BOLUS
1000.0000 mL | Freq: Once | INTRAVENOUS | Status: AC
  Administered 2019-10-05: 1000 mL via INTRAVENOUS

## 2019-10-04 MED ORDER — SODIUM CHLORIDE 0.9% FLUSH
3.0000 mL | Freq: Once | INTRAVENOUS | Status: AC
  Administered 2019-10-04: 3 mL via INTRAVENOUS

## 2019-10-04 MED ORDER — SODIUM CHLORIDE 0.9 % IV SOLN
1.0000 g | Freq: Once | INTRAVENOUS | Status: AC
  Administered 2019-10-04: 1 g via INTRAVENOUS
  Filled 2019-10-04: qty 10

## 2019-10-04 NOTE — ED Notes (Addendum)
Pt is confuse and when asked if he uses oxygen at home replies that he uses metamucil and that it does well

## 2019-10-04 NOTE — ED Notes (Signed)
Per spouse pt also w hx of prostate ca and has completed his treatments

## 2019-10-04 NOTE — ED Notes (Signed)
Pt lactic acid critical   2.3  Both JI,PA and Tiffany , RN informed   Report to Ulla Gallo, RN

## 2019-10-04 NOTE — ED Notes (Signed)
Pt w ? Elevation leads 1, 3   With depressions to V4, V6  On EKG

## 2019-10-04 NOTE — ED Notes (Signed)
Pt with confusion and chest pain   Pt is diaphoretic and has hx of CABG  IV x 2 started as well as lab to lab

## 2019-10-04 NOTE — ED Provider Notes (Signed)
[facility] EMERGENCY DEPARTMENT Provider Note   CSN: [id number] Arrival date & time: 10/04/19  2056     History Chief Complaint  Patient presents with  . Chest Pain    Dean Cole is a 69 y.o. male with a history of diabetes, CAD, hypertension, hypothyroidism, hyperlipidemia and prostate cancer and per wife at bedside slowly progressing confusion and difficulty ambulating, describes a shuffling gait for which he is awaiting neurologic evaluation presenting with complaints of increased urinary frequency along with significantly worsened confusion today.  After arrival he had complaint of chest pain, although now denies having any chest pain.  He also denies shortness of breath, no nausea or vomiting, denies abdominal pain, wife states he has had intermittent headaches, he last had a dose of Tylenol this morning and he denies headache at this time.  He fell today out in the yard according to the wife, but he does not recall doing this and he denies any injuries or complaints of pain currently.  The history is provided by the patient.       Past Medical History:  Diagnosis Date  . Anxiety   . Cancer ([place])    skin cancer  basel cell carcinoma  . Cholecystitis   . Controlled type 2 DM with peripheral circulatory disorder ([place]) 09/20/2014  . Coronary artery disease   . Depression   . Headache(784.0)   . HTN (hypertension)   . Hx of skin cancer, basal cell   . Hyperlipidemia   . Hypothyroidism   . PAD (peripheral artery disease) ([place]) 09/20/2014  . Prostate cancer ([place])   . Restless leg syndrome   . Skin cancer    melanoma    Patient Active Problem List   Diagnosis Date Noted  . Educated about COVID-19 virus infection 08/05/2019  . Acute kidney failure ([place]) 07/07/2019  . AMS (altered mental status) 07/07/2019  . Postural dizziness 07/07/2019  . Hypocalcemia 07/07/2019  . Sepsis ([place]) 07/07/2019  . Colitis 07/07/2019  . N&V (nausea and vomiting) 07/06/2019  . Lung  nodule 07/06/2019  . Fever 07/06/2019  . Malignant neoplasm of prostate ([place]) 07/23/2018  . Cellulitis of toe 05/23/2018  . Acquired deformity of toenail 05/15/2018  . Foot callus 04/10/2018  . Long toenail 04/10/2018  . Encounter for Medicare annual wellness exam 12/19/2017  . Ureteral stone with hydronephrosis 08/04/2017  . Anxiety, generalized 03/19/2017  . Essential hypertension 03/19/2017  . Moderate episode of recurrent major depressive disorder ([place]) 03/19/2017  . Hearing loss 04/19/2016  . PAD (peripheral artery disease) ([place]) 09/20/2014  . Controlled type 2 DM with peripheral circulatory disorder ([place]) 09/20/2014  . Depression 09/16/2014  . Erectile dysfunction 07/02/2014  . Type II diabetes mellitus with renal manifestations ([place]) 03/04/2014  . Eczema 12/25/2012  . Anxiety   . Dyslipidemia 02/29/2012  . Hypertensive heart disease without CHF   . Hypothyroidism   . Coronary artery disease   . Basal cell carcinoma   . Headache   . Restless leg syndrome   . Melanoma of skin ([place]) 06/14/2001    Past Surgical History:  Procedure Laterality Date  . BASAL CELL CARCINOMA EXCISION  2000   Dr,Arkin   . CARDIAC CATHETERIZATION  07/07/2011  . CHOLECYSTECTOMY  09/14/2011   Procedure: LAPAROSCOPIC CHOLECYSTECTOMY WITH INTRAOPERATIVE CHOLANGIOGRAM;  Surgeon: Gwenyth Ober, MD;  Location: [place];  Service: General;  Laterality: N/A;  . CORONARY ARTERY BYPASS GRAFT  07/10/2011   Procedure: CORONARY ARTERY BYPASS GRAFTING (CABG);  Surgeon: Collier Salina  Prescott Gum, MD;  Location: [place];  Service: Open Heart Surgery;  Laterality: N/A;  . EXCISIONAL HEMORRHOIDECTOMY  1997  . HEMORROIDECTOMY  2000  . LEFT HEART CATHETERIZATION WITH CORONARY ANGIOGRAM N/A 07/07/2011   Procedure: LEFT HEART CATHETERIZATION WITH CORONARY ANGIOGRAM;  Surgeon: Burnell Blanks, MD;  Location: [facility] CATH LAB;  Service: Cardiovascular;  Laterality: N/A;       Family History  Problem Relation Age of Onset  .  Coronary artery disease Father   . Prostate cancer Father   . Leukemia Mother   . Suicidality Brother   . Coronary artery disease Paternal Grandfather   . Breast cancer Neg Hx   . Colon cancer Neg Hx   . Pancreatic cancer Neg Hx     Social History   Tobacco Use  . Smoking status: Former Smoker    Packs/day: 1.00    Years: 21.00    Pack years: 21.00    Types: Cigarettes    Quit date: 08/08/1981    Years since quitting: 38.1  . Smokeless tobacco: Former Network engineer Use Topics  . Alcohol use: No    Comment: former  . Drug use: No    Home Medications Prior to Admission medications   Medication Sig Start Date End Date Taking? Authorizing Provider  aspirin EC 325 MG tablet Take 325 mg by mouth every morning.   Yes [provider]  Cholecalciferol (VITAMIN D3 PO) Take 2 tablets by mouth daily.   Yes [provider]  clotrimazole-betamethasone (LOTRISONE) cream APPLY TO AFFECTED AREA TWICE A DAY Patient taking differently: Apply 1 application topically 2 (two) times daily as needed (for skin irritation).  04/28/19  Yes Trula Slade, DPM  diazepam (VALIUM) 10 MG tablet Take 10 mg by mouth 2 (two) times daily. 09/22/19  Yes [provider]  gabapentin (NEURONTIN) 100 MG capsule TAKE 1 CAPSULE BY MOUTH THREE TIMES A DAY Patient taking differently: Take 100 mg by mouth 3 (three) times daily.  09/08/19  Yes Trula Slade, DPM  imipramine (TOFRANIL) 25 MG tablet Take 100 mg by mouth at bedtime.  12/23/18  Yes [provider]  levothyroxine (SYNTHROID) 175 MCG tablet Take 175 mcg by mouth daily before breakfast.  07/08/19  Yes [provider]  magnesium oxide (MAG-OX) 400 (241.3 Mg) MG tablet Take 1 tablet (400 mg total) by mouth daily. Patient taking differently: Take 400 mg by mouth at bedtime.  07/08/19  Yes Black, Lezlie Octave, NP  metFORMIN (GLUCOPHAGE) 500 MG tablet Take 1 tablet (500 mg total) by mouth 2 (two) times daily with a meal.  08/12/15  Yes Mast, Man X, NP  metoprolol tartrate (LOPRESSOR) 25 MG tablet Take 25 mg by mouth 2 (two) times daily.   Yes [provider]  mupirocin ointment (BACTROBAN) 2 % Apply 1 application topically 2 (two) times daily. Patient taking differently: Apply 1 application topically 2 (two) times daily as needed (to affected area(s) on head).  01/10/19  Yes Trula Slade, DPM  MYRBETRIQ 25 MG TB24 tablet TAKE 1 TABLET BY MOUTH EVERY DAY Patient taking differently: Take 25 mg by mouth daily.  04/27/19  Yes Bruning, Ashlyn, PA-C  NITROSTAT 0.4 MG SL tablet Place 0.4 mg under the tongue every 5 (five) minutes as needed for chest pain. Reported on 10/26/2015 07/06/11  Yes [provider]  Nutritional Supplements (ENSURE PO) Take 1 Bottle by mouth in the morning, at noon, and at bedtime.   Yes [provider]  pentazocine-naloxone (TALWIN NX) 50-0.5 MG tablet Take 1 tablet by mouth in the morning and at bedtime.    Yes [provider]  simvastatin (ZOCOR) 20 MG tablet TAKE ONE TABLET BY MOUTH ONCE DAILY FOR CHOLESTEROL Patient taking differently: Take 20 mg by mouth daily. Take one tablet by mouth once daily for cholesterol 10/16/16  Yes Reed, Tiffany L, DO  tamsulosin (FLOMAX) 0.4 MG CAPS capsule Take 0.4 mg by mouth 2 (two) times daily.   Yes [provider]    Allergies    Eggs or egg-derived products and Adhesive [tape]  Review of Systems   Review of Systems  Constitutional: Positive for chills, fatigue and fever.  HENT: Negative for congestion and sore throat.   Eyes: Negative.   Respiratory: Negative for chest tightness and shortness of breath.   Cardiovascular: Positive for chest pain.  Gastrointestinal: Negative for abdominal pain, nausea and vomiting.  Genitourinary: Positive for dysuria and frequency.  Musculoskeletal: Negative for arthralgias, joint swelling and neck pain.  Skin: Negative.  Negative for rash and wound.  Neurological:  Negative for dizziness, weakness, light-headedness, numbness and headaches.  Psychiatric/Behavioral: Positive for confusion.    Physical Exam Updated Vital Signs BP (!) 144/75   Pulse (!) 101   Temp (!) 100.6 F (38.1 C) (Rectal)   Resp (!) 26   Ht 5\' 10"  (1.778 m)   Wt 79.8 kg   SpO2 97%   BMI 25.25 kg/m   Physical Exam Vitals and nursing note reviewed.  Constitutional:      General: He is not in acute distress.    Appearance: He is well-developed. He is diaphoretic.  HENT:     Head: Normocephalic and atraumatic.     Mouth/Throat:     Mouth: Mucous membranes are moist.     Pharynx: No oropharyngeal exudate.  Eyes:     Conjunctiva/sclera: Conjunctivae normal.  Cardiovascular:     Rate and Rhythm: Regular rhythm. Tachycardia present.     Pulses: Normal pulses.     Heart sounds: Normal heart sounds.  Pulmonary:     Effort: Pulmonary effort is normal.     Breath sounds: Normal breath sounds. No wheezing.  Abdominal:     General: Bowel sounds are normal. There is distension.     Palpations: Abdomen is soft.     Tenderness: There is no abdominal tenderness. There is no right CVA tenderness, left CVA tenderness or guarding.  Genitourinary:    Prostate: Normal.     Rectum: Guaiac result negative.  Musculoskeletal:        General: Normal range of motion.     Cervical back: Normal range of motion.  Skin:    General: Skin is warm.  Neurological:     General: No focal deficit present.     Mental Status: He is alert.     Comments: Initially pt was oriented x person, only with wife giving most hx.  At re-exam, pt more alert, A &O x 3.       ED Results / Procedures / Treatments   Labs (all labs ordered are listed, but only abnormal results are displayed) Labs Reviewed  BASIC METABOLIC PANEL - Abnormal; Notable for the following components:      Result Value   Chloride 97 (*)    Glucose, Bld 186 (*)    All other components within normal limits  CBC - Abnormal; Notable  for the following components:   Platelets 123 (*)    All  other components within normal limits  CBC WITH DIFFERENTIAL/PLATELET - Abnormal; Notable for the following components:   RBC 4.10 (*)    Hemoglobin 12.0 (*)    HCT 35.5 (*)    Platelets 100 (*)    All other components within normal limits  LACTIC ACID, PLASMA - Abnormal; Notable for the following components:   Lactic Acid, Venous 2.3 (*)    All other components within normal limits  CBG MONITORING, ED - Abnormal; Notable for the following components:   Glucose-Capillary 176 (*)    All other components within normal limits  CULTURE, BLOOD (ROUTINE X 2)  CULTURE, BLOOD (ROUTINE X 2)  URINE CULTURE  SARS CORONAVIRUS 2 BY RT PCR (HOSPITAL ORDER, [facility])  URINALYSIS, ROUTINE W REFLEX MICROSCOPIC  AMMONIA  HEPATIC FUNCTION PANEL  CBG MONITORING, ED  POC OCCULT BLOOD, ED  POC OCCULT BLOOD, ED  TROPONIN I (HIGH SENSITIVITY)  TROPONIN I (HIGH SENSITIVITY)      EKG Interpretation  Date/Time:  Saturday October 04 2019 21:25:48 EDT Ventricular Rate:  104 PR Interval:    QRS Duration: 103 QT Interval:  357 QTC Calculation: 470 R Axis:   46 Text Interpretation: Sinus tachycardia Borderline repolarization abnormality Baseline wander in lead(s) II III aVF V6 Confirmed by Milton Ferguson [phone]) on 10/04/2019 11:34:04 PM        Radiology CT Head Wo Contrast  Result Date: 10/04/2019 CLINICAL DATA:  Encephalopathy. Additional history provided: Urinary retention, chest pain, confusion, unsteady gait, history of prostate cancer. EXAM: CT HEAD WITHOUT CONTRAST TECHNIQUE: Contiguous axial images were obtained from the base of the skull through the vertex without intravenous contrast. COMPARISON:  Head CT 07/06/2019 FINDINGS: Brain: There is stable cerebral atrophy which is advanced for age. Redemonstrated mild ill-defined hypoattenuation within the cerebral white matter which is nonspecific, but consistent with  chronic small vessel ischemic disease. There is no acute intracranial hemorrhage. No demarcated cortical infarct. No extra-axial fluid collection. No evidence of intracranial mass. No midline shift. Vascular: No hyperdense vessel.  Atherosclerotic calcifications. Skull: Normal. Negative for fracture or focal lesion. Sinuses/Orbits: Visualized orbits show no acute finding. Trace ethmoid sinus mucosal thickening at the imaged levels. No significant mastoid effusion. IMPRESSION: No CT evidence of acute intracranial abnormality. Stable cerebral atrophy which is advanced for age. Mild chronic small vessel ischemic disease. Trace ethmoid sinus mucosal thickening at the imaged levels. Electronically Signed   By: Kellie Simmering DO   On: 10/04/2019 23:57   DG Chest Port 1 View  Result Date: 10/04/2019 CLINICAL DATA:  Chest pain and confusion. EXAM: PORTABLE CHEST 1 VIEW COMPARISON:  Radiographs and CT 07/06/2019 FINDINGS: Low lung volumes limit assessment. Post median sternotomy and CABG. Mild cardiomegaly is likely accentuated by technique. Bandlike scarring in the left lung base unchanged, there is increased atelectasis at the right lung base. Bronchovascular crowding versus vascular congestion. No pneumothorax or large pleural effusion. No acute osseous abnormalities are seen. IMPRESSION: 1. Low lung volumes with bronchovascular crowding versus vascular congestion. 2. Increased right lung base atelectasis. Left lung base scarring is unchanged from prior exam. Electronically Signed   By: Keith Rake M.D.   On: 10/04/2019 21:54    Procedures Procedures (including critical care time)  Medications Ordered in ED Medications  sodium chloride flush (NS) 0.9 % injection 3 mL (3 mLs Intravenous Given 10/04/19 2128)  cefTRIAXone (ROCEPHIN) 1 g in sodium chloride 0.9 % 100 mL IVPB (0 g Intravenous Stopped 10/05/19 0004)  sodium  chloride 0.9 % bolus 1,000 mL (1,000 mLs Intravenous New Bag/Given 10/05/19 0005)    ED  Course  I have reviewed the triage vital signs and the nursing notes.  Pertinent labs & imaging results that were available during my care of the patient were reviewed by me and considered in my medical decision making (see chart for details).    MDM Rules/Calculators/A&P                      Patient with initial complaint of urinary frequency and urgency, UA negative, prostate mildly tender but not boggy.  Brief complaint of chest pain upon arrival which then resolved, with patient denying history of chest pain, wife denied he had any complaint of the symptom at home.  He has had increased significant confusion today along with fall x1 in the yard.  He is febrile here, borderline tachycardic, normal WBC count, urine culture and blood cultures are pending at this time.  Pt with acute encephalopathy with fever of unclear etiology.  Blood cultures/ urine cultures pending.  Pt seen by Dr Roderic Palau who agrees with need for admission.  Discussed with Dr. Humphrey Rolls who will assess pt for admission criteria    Final Clinical Impression(s) / ED Diagnoses Final diagnoses:  Acute encephalopathy  Fever, unspecified fever cause  Dysuria  Sepsis, due to unspecified organism, unspecified whether acute organ dysfunction present [facility])    Rx / DC Orders ED Discharge Orders    None       Landis Martins 10/05/19 0024    Milton Ferguson, MD 10/05/19 1627

## 2019-10-04 NOTE — ED Triage Notes (Signed)
Pt arrives in Triage from home with spouse c/o urinary retention, chest pain and confusion. Spouse reports he is planned to see a Neurologist this upcoming Monday for unsteady gait and new onset confusion. Pt presents Alerted in Triage and is poor historian. Most information is retrieved from Pts spouse.

## 2019-10-04 NOTE — ED Notes (Signed)
Lab at bedside

## 2019-10-05 DIAGNOSIS — G9341 Metabolic encephalopathy: Secondary | ICD-10-CM | POA: Diagnosis present

## 2019-10-05 DIAGNOSIS — R3915 Urgency of urination: Secondary | ICD-10-CM | POA: Diagnosis present

## 2019-10-05 DIAGNOSIS — E1165 Type 2 diabetes mellitus with hyperglycemia: Secondary | ICD-10-CM | POA: Diagnosis present

## 2019-10-05 LAB — BASIC METABOLIC PANEL
Anion gap: 9 (ref 5–15)
BUN: 10 mg/dL (ref 8–23)
CO2: 27 mmol/L (ref 22–32)
Calcium: 8.4 mg/dL — ABNORMAL LOW (ref 8.9–10.3)
Chloride: 102 mmol/L (ref 98–111)
Creatinine, Ser: 0.9 mg/dL (ref 0.61–1.24)
GFR calc Af Amer: >60 mL/min (ref >60)
GFR calc non Af Amer: >60 mL/min (ref >60)
Glucose, Bld: 161 mg/dL — ABNORMAL HIGH (ref 70–99)
Potassium: 3.2 mmol/L — ABNORMAL LOW (ref 3.5–5.1)
Sodium: 138 mmol/L (ref 135–145)

## 2019-10-05 LAB — HEPATIC FUNCTION PANEL
ALT: 14 U/L (ref 0–44)
AST: 16 U/L (ref 15–41)
Albumin: 3.3 g/dL — ABNORMAL LOW (ref 3.5–5.0)
Alkaline Phosphatase: 105 U/L (ref 38–126)
Bilirubin, Direct: 0.1 mg/dL (ref 0.0–0.2)
Indirect Bilirubin: 0.5 mg/dL (ref 0.3–0.9)
Total Bilirubin: 0.6 mg/dL (ref 0.3–1.2)
Total Protein: 6.2 g/dL — ABNORMAL LOW (ref 6.5–8.1)

## 2019-10-05 LAB — CBC
HCT: 33.2 % — ABNORMAL LOW (ref 39.0–52.0)
Hemoglobin: 11.3 g/dL — ABNORMAL LOW (ref 13.0–17.0)
MCH: 29.6 pg (ref 26.0–34.0)
MCHC: 34 g/dL (ref 30.0–36.0)
MCV: 86.9 fL (ref 80.0–100.0)
Platelets: 90 10*3/uL — ABNORMAL LOW (ref 150–400)
RBC: 3.82 MIL/uL — ABNORMAL LOW (ref 4.22–5.81)
RDW: 14.6 % (ref 11.5–15.5)
WBC: 6.5 10*3/uL (ref 4.0–10.5)
nRBC: 0 % (ref 0.0–0.2)

## 2019-10-05 LAB — HEMOGLOBIN A1C
Hgb A1c MFr Bld: 7 % — ABNORMAL HIGH (ref 4.8–5.6)
Mean Plasma Glucose: 154.2 mg/dL

## 2019-10-05 LAB — LACTIC ACID, PLASMA: Lactic Acid, Venous: 1.9 mmol/L (ref 0.5–1.9)

## 2019-10-05 LAB — TSH: TSH: 2.169 u[IU]/mL (ref 0.350–4.500)

## 2019-10-05 LAB — SARS CORONAVIRUS 2 BY RT PCR (HOSPITAL ORDER, PERFORMED IN ~~LOC~~ HOSPITAL LAB): SARS Coronavirus 2: NEGATIVE

## 2019-10-05 LAB — POC OCCULT BLOOD, ED: Fecal Occult Bld: NEGATIVE

## 2019-10-05 LAB — AMMONIA: Ammonia: 25 umol/L (ref 9–35)

## 2019-10-05 LAB — RPR: RPR Ser Ql: NONREACTIVE

## 2019-10-05 LAB — VITAMIN B12: Vitamin B-12: 248 pg/mL (ref 180–914)

## 2019-10-05 MED ORDER — TAMSULOSIN HCL 0.4 MG PO CAPS: 0.4000 mg | ORAL_CAPSULE | Freq: Two times a day (BID) | ORAL | Status: DC

## 2019-10-05 MED ORDER — ACETAMINOPHEN 325 MG PO TABS: 650.0000 mg | ORAL_TABLET | Freq: Four times a day (QID) | ORAL | Status: DC | PRN

## 2019-10-05 MED ORDER — ONDANSETRON HCL 4 MG PO TABS: 4.0000 mg | ORAL_TABLET | Freq: Four times a day (QID) | ORAL | Status: DC | PRN

## 2019-10-05 MED ORDER — IMIPRAMINE HCL 25 MG PO TABS
100.0000 mg | ORAL_TABLET | Freq: Every day | ORAL | Status: DC
  Filled 2019-10-05: qty 2

## 2019-10-05 MED ORDER — ACETAMINOPHEN 650 MG RE SUPP: 650.0000 mg | Freq: Four times a day (QID) | RECTAL | Status: DC | PRN

## 2019-10-05 MED ORDER — PENTAZOCINE-NALOXONE HCL 50-0.5 MG PO TABS
1.0000 | ORAL_TABLET | Freq: Two times a day (BID) | ORAL | Status: DC
  Administered 2019-10-05: 1 via ORAL

## 2019-10-05 MED ORDER — SODIUM CHLORIDE 0.9 % IV SOLN: Freq: Once | INTRAVENOUS | Status: AC

## 2019-10-05 MED ORDER — METOPROLOL TARTRATE 25 MG PO TABS
25.0000 mg | ORAL_TABLET | Freq: Two times a day (BID) | ORAL | Status: DC
  Administered 2019-10-05: 25 mg via ORAL
  Filled 2019-10-05: qty 1

## 2019-10-05 MED ORDER — ONDANSETRON HCL 4 MG/2ML IJ SOLN: 4.0000 mg | Freq: Four times a day (QID) | INTRAMUSCULAR | Status: DC | PRN

## 2019-10-05 MED ORDER — PIPERACILLIN-TAZOBACTAM 3.375 G IVPB: 3.3750 g | Freq: Three times a day (TID) | INTRAVENOUS | Status: DC

## 2019-10-05 MED ORDER — ENOXAPARIN SODIUM 40 MG/0.4ML ~~LOC~~ SOLN: 40.0000 mg | SUBCUTANEOUS | Status: DC

## 2019-10-05 MED ORDER — INSULIN ASPART 100 UNIT/ML ~~LOC~~ SOLN: 0.0000 [IU] | Freq: Three times a day (TID) | SUBCUTANEOUS | Status: DC

## 2019-10-05 MED ORDER — MIRABEGRON ER 25 MG PO TB24
25.0000 mg | ORAL_TABLET | Freq: Every day | ORAL | Status: DC
  Filled 2019-10-05 (×2): qty 1

## 2019-10-05 MED ORDER — SIMVASTATIN 10 MG PO TABS: 20.0000 mg | ORAL_TABLET | Freq: Every day | ORAL | Status: DC

## 2019-10-05 MED ORDER — ASPIRIN EC 325 MG PO TBEC: 325.0000 mg | DELAYED_RELEASE_TABLET | Freq: Every morning | ORAL | Status: DC

## 2019-10-05 MED ORDER — GABAPENTIN 100 MG PO CAPS: 100.0000 mg | ORAL_CAPSULE | Freq: Three times a day (TID) | ORAL | Status: DC

## 2019-10-05 MED ORDER — LEVOTHYROXINE SODIUM 50 MCG PO TABS
175.0000 ug | ORAL_TABLET | Freq: Every day | ORAL | Status: DC
  Administered 2019-10-05: 175 ug via ORAL
  Filled 2019-10-05: qty 4

## 2019-10-05 MED ORDER — PIPERACILLIN-TAZOBACTAM 3.375 G IVPB 30 MIN
3.3750 g | INTRAVENOUS | Status: AC
  Administered 2019-10-05: 3.375 g via INTRAVENOUS
  Filled 2019-10-05: qty 50

## 2019-10-05 MED ORDER — INSULIN ASPART 100 UNIT/ML ~~LOC~~ SOLN: 0.0000 [IU] | Freq: Every day | SUBCUTANEOUS | Status: DC

## 2019-10-05 NOTE — Discharge Summary (Signed)
Physician Discharge Summary  Dean Cole LFY:[id number] DOB: Dec 13, 1950 DOA: 10/04/2019  PCP: [name], MD  Admit date: 10/04/2019  Discharge date: 10/05/2019  Admitted From:Home  Disposition:  Home  Recommendations for Outpatient Follow-up:  1. Follow up with PCP in 1-2 weeks 2. Follow-up with neurology as scheduled on 6/7 for further evaluation 3. Continue on home medications as prior  Home Health: None  Equipment/Devices: None  Discharge Condition: Stable  CODE STATUS: Full  Diet recommendation: Heart Healthy  Brief/Interim Summary: This is a 69 year old gentleman with history of hypertension, dyslipidemia, prostate cancer, hypothyroidism, and anxiety/depression.  He was admitted earlier this morning with worsening confusion and ambulatory dysfunction that was apparently noted to worsen over the last 3-4 days.  According to my discussion with the patient and the wife, he was recently hospitalized at [facility] back on 06/2019 and at that time was noted to have acute metabolic encephalopathy in the setting of enteritis/colitis as well as AKI.  His symptoms had improved and his C. difficile and GI pathogen panel was negative.  He was offered colonoscopy at that time, but refused and wanted to be discharged.  He was noted to have some frequent falls and postural dizziness with gait disturbances at that time which have also resolved.  He was assessed by physical therapy and at that time was recommended for outpatient physical therapy after he refused home health physical therapy.  He was actually able to ambulate quite well at that time.  His wife essentially states that his symptoms have just been persistent and he has remained somewhat confused over several months and has had ongoing gait disturbances.  He decided to come into the ED today just for further evaluation, but no other acute changes have been noted.  They both state that the patient has an appointment with the  neurologist tomorrow morning for further evaluation.  CT of the head does not demonstrate any acute findings and there are otherwise no other acute findings on lab work at this time.  He was thought to have sepsis with a mild fever, but there are no signs of UTI or even symptomatology of urinary urgency or dysuria on my evaluation and chest x-ray does not demonstrate any pneumonia.  There are no other signs of infection on the skin, joints, or otherwise on my exam.  He also has no leukocytosis or any laboratory signs to indicate infection at this time.  He is alert and oriented x3 and is able to give a full history.  He did receive some Rocephin and was started on Zosyn in the ED.  I believe that he does need a neurology evaluation and on recent admission was noted to have TSH, B12 and RPR levels within normal limits.  I have offered the patient the choice to remain in the hospital for neurology evaluation if he does not feel comfortable going home at this time or the option to simply follow-up with neurology as scheduled tomorrow morning.  The patient and his wife became frustrated over the misinformation they received earlier this morning but, he states that he feels comfortable being discharged as there are no other active medical issues noted and he will follow-up with neurology in the outpatient setting as scheduled.   Discharge Diagnoses:  Principal Problem:   Acute metabolic encephalopathy Active Problems:   Hypothyroidism   Basal cell carcinoma   Sepsis ([place])   Urinary urgency   Hyperglycemia due to diabetes mellitus [facility])  Principal discharge diagnosis:  Chronic encephalopathy with gait disturbances with need for further neurologic evaluation in the outpatient setting.  Discharge Instructions  Discharge Instructions    Diet - low sodium heart healthy   Complete by: As directed    Increase activity slowly   Complete by: As directed      Allergies as of 10/05/2019      Reactions    Eggs Or Egg-derived Products Other (See Comments)   Bloating, nausea, and no rash    Adhesive [tape] Swelling      Medication List    TAKE these medications   aspirin EC 325 MG tablet Take 325 mg by mouth every morning.   clotrimazole-betamethasone cream Commonly known as: LOTRISONE APPLY TO AFFECTED AREA TWICE A DAY What changed: See the new instructions.   diazepam 10 MG tablet Commonly known as: VALIUM Take 10 mg by mouth 2 (two) times daily.   ENSURE PO Take 1 Bottle by mouth in the morning, at noon, and at bedtime.   gabapentin 100 MG capsule Commonly known as: NEURONTIN TAKE 1 CAPSULE BY MOUTH THREE TIMES A DAY What changed: See the new instructions.   imipramine 25 MG tablet Commonly known as: TOFRANIL Take 100 mg by mouth at bedtime.   levothyroxine 175 MCG tablet Commonly known as: SYNTHROID Take 175 mcg by mouth daily before breakfast.   magnesium oxide 400 (241.3 Mg) MG tablet Commonly known as: MAG-OX Take 1 tablet (400 mg total) by mouth daily. What changed: when to take this   metFORMIN 500 MG tablet Commonly known as: GLUCOPHAGE Take 1 tablet (500 mg total) by mouth 2 (two) times daily with a meal.   metoprolol tartrate 25 MG tablet Commonly known as: LOPRESSOR Take 25 mg by mouth 2 (two) times daily.   mupirocin ointment 2 % Commonly known as: BACTROBAN Apply 1 application topically 2 (two) times daily. What changed:   when to take this  reasons to take this   Myrbetriq 25 MG Tb24 tablet Generic drug: mirabegron ER TAKE 1 TABLET BY MOUTH EVERY DAY What changed: how much to take   Nitrostat 0.4 MG SL tablet Generic drug: nitroGLYCERIN Place 0.4 mg under the tongue every 5 (five) minutes as needed for chest pain. Reported on 10/26/2015   pentazocine-naloxone 50-0.5 MG tablet Commonly known as: TALWIN NX Take 1 tablet by mouth in the morning and at bedtime.   simvastatin 20 MG tablet Commonly known as: ZOCOR TAKE ONE TABLET BY MOUTH  ONCE DAILY FOR CHOLESTEROL What changed: See the new instructions.   tamsulosin 0.4 MG Caps capsule Commonly known as: FLOMAX Take 0.4 mg by mouth 2 (two) times daily.   VITAMIN D3 PO Take 2 tablets by mouth daily.      Follow-up Information    [name], MD Follow up in 1 week(s).   Specialty: Family Medicine Contact information: [place] [address] [phone]        neurology Follow up in 1 day(s).          Allergies  Allergen Reactions  . Eggs Or Egg-Derived Products Other (See Comments)    Bloating, nausea, and no rash   . Adhesive [Tape] Swelling    Consultations:  None   Procedures/Studies: CT Head Wo Contrast  Result Date: 10/04/2019 CLINICAL DATA:  Encephalopathy. Additional history provided: Urinary retention, chest pain, confusion, unsteady gait, history of prostate cancer. EXAM: CT HEAD WITHOUT CONTRAST TECHNIQUE: Contiguous axial images were obtained from the base of the skull  through the vertex without intravenous contrast. COMPARISON:  Head CT 07/06/2019 FINDINGS: Brain: There is stable cerebral atrophy which is advanced for age. Redemonstrated mild ill-defined hypoattenuation within the cerebral white matter which is nonspecific, but consistent with chronic small vessel ischemic disease. There is no acute intracranial hemorrhage. No demarcated cortical infarct. No extra-axial fluid collection. No evidence of intracranial mass. No midline shift. Vascular: No hyperdense vessel.  Atherosclerotic calcifications. Skull: Normal. Negative for fracture or focal lesion. Sinuses/Orbits: Visualized orbits show no acute finding. Trace ethmoid sinus mucosal thickening at the imaged levels. No significant mastoid effusion. IMPRESSION: No CT evidence of acute intracranial abnormality. Stable cerebral atrophy which is advanced for age. Mild chronic small vessel ischemic disease. Trace ethmoid sinus mucosal thickening at the imaged levels.  Electronically Signed   By: Kellie Simmering DO   On: 10/04/2019 23:57   DG Chest Port 1 View  Result Date: 10/04/2019 CLINICAL DATA:  Chest pain and confusion. EXAM: PORTABLE CHEST 1 VIEW COMPARISON:  Radiographs and CT 07/06/2019 FINDINGS: Low lung volumes limit assessment. Post median sternotomy and CABG. Mild cardiomegaly is likely accentuated by technique. Bandlike scarring in the left lung base unchanged, there is increased atelectasis at the right lung base. Bronchovascular crowding versus vascular congestion. No pneumothorax or large pleural effusion. No acute osseous abnormalities are seen. IMPRESSION: 1. Low lung volumes with bronchovascular crowding versus vascular congestion. 2. Increased right lung base atelectasis. Left lung base scarring is unchanged from prior exam. Electronically Signed   By: Keith Rake M.D.   On: 10/04/2019 21:54     Discharge Exam: Vitals:   10/05/19 0534 10/05/19 0644  BP: 117/74 (!) 163/81  Pulse: 72 79  Resp: 14 10  Temp:    SpO2: 99% 96%   Vitals:   10/05/19 0200 10/05/19 0310 10/05/19 0534 10/05/19 0644  BP: (!) 150/79  117/74 (!) 163/81  Pulse: 91 94 72 79  Resp: 20 18 14 10   Temp:      TempSrc:      SpO2: 97% 95% 99% 96%  Weight:      Height:        General: Pt is alert, awake, not in acute distress Cardiovascular: RRR, S1/S2 +, no rubs, no gallops Respiratory: CTA bilaterally, no wheezing, no rhonchi Abdominal: Soft, NT, ND, bowel sounds + Extremities: no edema, no cyanosis    The results of significant diagnostics from this hospitalization (including imaging, microbiology, ancillary and laboratory) are listed below for reference.     Microbiology: Recent Results (from the past 240 hour(s))  Blood culture (routine x 2)     Status: None (Preliminary result)   Collection Time: 10/04/19 10:01 PM   Specimen: Right Antecubital; Blood  Result Value Ref Range Status   Specimen Description RIGHT ANTECUBITAL  Final   Special Requests    Final    BOTTLES DRAWN AEROBIC AND ANAEROBIC Blood Culture adequate volume Performed at [facility], [address], [place], [place] [address]    Culture PENDING  Incomplete   Report Status PENDING  Incomplete  Blood culture (routine x 2)     Status: None (Preliminary result)   Collection Time: 10/04/19 10:08 PM   Specimen: BLOOD RIGHT ARM  Result Value Ref Range Status   Specimen Description BLOOD RIGHT ARM  Final   Special Requests   Final    BOTTLES DRAWN AEROBIC ONLY Blood Culture adequate volume Performed at [facility], [address] [place], [place] [address]    Culture PENDING  Incomplete   Report Status PENDING  Incomplete  SARS Coronavirus 2 by RT PCR (hospital order, performed in [facility] hospital lab) Nasopharyngeal Nasopharyngeal Swab     Status: None   Collection Time: 10/05/19 12:06 AM   Specimen: Nasopharyngeal Swab  Result Value Ref Range Status   SARS Coronavirus 2 NEGATIVE NEGATIVE Final    Comment: (NOTE) SARS-CoV-2 target nucleic acids are NOT DETECTED. The SARS-CoV-2 RNA is generally detectable in upper and lower respiratory specimens during the acute phase of infection. The lowest concentration of SARS-CoV-2 viral copies this assay can detect is 250 copies / mL. A negative result does not preclude SARS-CoV-2 infection and should not be used as the sole basis for treatment or other patient management decisions.  A negative result may occur with improper specimen collection / handling, submission of specimen other than nasopharyngeal swab, presence of viral mutation(s) within the areas targeted by this assay, and inadequate number of viral copies (<250 copies / mL). A negative result must be combined with clinical observations, patient history, and epidemiological information. Fact Sheet for Patients:   StrictlyIdeas.no Fact Sheet for Healthcare Providers: BankingDealers.co.za This test is not yet  approved or cleared  by the [place] FDA and has been authorized for detection and/or diagnosis of SARS-CoV-2 by FDA under an Emergency Use Authorization (EUA).  This EUA will remain in effect (meaning this test can be used) for the duration of the COVID-19 declaration under Section 564(b)(1) of the Act, 21 U.S.C. section 360bbb-3(b)(1), unless the authorization is terminated or revoked sooner. Performed at [facility], [address] [place], [place] [address]      Labs: BNP (last 3 results) No results for input(s): BNP in the last 8760 hours. Basic Metabolic Panel: Recent Labs  Lab 10/04/19 2113 10/05/19 0228  NA 137 138  K 3.6 3.2*  CL 97* 102  CO2 29 27  GLUCOSE 186* 161*  BUN 10 10  CREATININE 1.06 0.90  CALCIUM 9.1 8.4*   Liver Function Tests: Recent Labs  Lab 10/05/19 0048  AST 16  ALT 14  ALKPHOS 105  BILITOT 0.6  PROT 6.2*  ALBUMIN 3.3*   No results for input(s): LIPASE, AMYLASE in the last 168 hours. Recent Labs  Lab 10/05/19 0048  AMMONIA 25   CBC: Recent Labs  Lab 10/04/19 2113 10/04/19 2200 10/05/19 0228  WBC 8.4 7.1 6.5  NEUTROABS  --  4.7  --   HGB 13.7 12.0* 11.3*  HCT 41.2 35.5* 33.2*  MCV 87.7 86.6 86.9  PLT 123* 100* 90*   Cardiac Enzymes: No results for input(s): CKTOTAL, CKMB, CKMBINDEX, TROPONINI in the last 168 hours. BNP: Invalid input(s): POCBNP CBG: Recent Labs  Lab 10/04/19 2137  GLUCAP 176*   D-Dimer No results for input(s): DDIMER in the last 72 hours. Hgb A1c No results for input(s): HGBA1C in the last 72 hours. Lipid Profile No results for input(s): CHOL, HDL, LDLCALC, TRIG, CHOLHDL, LDLDIRECT in the last 72 hours. Thyroid function studies Recent Labs    10/04/19 2120  TSH 2.169   Anemia work up Recent Labs    10/04/19 2120  VITAMINB12 248   Urinalysis    Component Value Date/Time   COLORURINE YELLOW 10/04/2019 2156   APPEARANCEUR CLEAR 10/04/2019 2156   APPEARANCEUR Clear 10/26/2015 1638    LABSPEC 1.015 10/04/2019 2156   PHURINE 5.0 10/04/2019 2156   GLUCOSEU NEGATIVE 10/04/2019 2156   HGBUR NEGATIVE 10/04/2019 2156   BILIRUBINUR NEGATIVE 10/04/2019 2156   BILIRUBINUR  Negative 10/26/2015 [place] 10/04/2019 2156   PROTEINUR NEGATIVE 10/04/2019 2156   UROBILINOGEN 1.0 09/11/2011 2335   NITRITE NEGATIVE 10/04/2019 2156   LEUKOCYTESUR NEGATIVE 10/04/2019 2156   Sepsis Labs Invalid input(s): PROCALCITONIN,  WBC,  LACTICIDVEN Microbiology Recent Results (from the past 240 hour(s))  Blood culture (routine x 2)     Status: None (Preliminary result)   Collection Time: 10/04/19 10:01 PM   Specimen: Right Antecubital; Blood  Result Value Ref Range Status   Specimen Description RIGHT ANTECUBITAL  Final   Special Requests   Final    BOTTLES DRAWN AEROBIC AND ANAEROBIC Blood Culture adequate volume Performed at [facility], [address] [place], [place] [address]    Culture PENDING  Incomplete   Report Status PENDING  Incomplete  Blood culture (routine x 2)     Status: None (Preliminary result)   Collection Time: 10/04/19 10:08 PM   Specimen: BLOOD RIGHT ARM  Result Value Ref Range Status   Specimen Description BLOOD RIGHT ARM  Final   Special Requests   Final    BOTTLES DRAWN AEROBIC ONLY Blood Culture adequate volume Performed at [facility], [address] [place], [place] [address]    Culture PENDING  Incomplete   Report Status PENDING  Incomplete  SARS Coronavirus 2 by RT PCR (hospital order, performed in [place] hospital lab) Nasopharyngeal Nasopharyngeal Swab     Status: None   Collection Time: 10/05/19 12:06 AM   Specimen: Nasopharyngeal Swab  Result Value Ref Range Status   SARS Coronavirus 2 NEGATIVE NEGATIVE Final    Comment: (NOTE) SARS-CoV-2 target nucleic acids are NOT DETECTED. The SARS-CoV-2 RNA is generally detectable in upper and lower respiratory specimens during the acute phase of infection. The lowest concentration of  SARS-CoV-2 viral copies this assay can detect is 250 copies / mL. A negative result does not preclude SARS-CoV-2 infection and should not be used as the sole basis for treatment or other patient management decisions.  A negative result may occur with improper specimen collection / handling, submission of specimen other than nasopharyngeal swab, presence of viral mutation(s) within the areas targeted by this assay, and inadequate number of viral copies (<250 copies / mL). A negative result must be combined with clinical observations, patient history, and epidemiological information. Fact Sheet for Patients:   StrictlyIdeas.no Fact Sheet for Healthcare Providers: BankingDealers.co.za This test is not yet approved or cleared  by the [place] FDA and has been authorized for detection and/or diagnosis of SARS-CoV-2 by FDA under an Emergency Use Authorization (EUA).  This EUA will remain in effect (meaning this test can be used) for the duration of the COVID-19 declaration under Section 564(b)(1) of the Act, 21 U.S.C. section 360bbb-3(b)(1), unless the authorization is terminated or revoked sooner. Performed at [facility], [address], [place], [place] [address]      Time coordinating discharge: 35 minutes  SIGNED:   Rodena Goldmann, DO [facility] 10/05/2019, 8:34 AM  If 7PM-7AM, please contact night-coverage [email or web address]

## 2019-10-05 NOTE — Progress Notes (Signed)
Pharmacy Antibiotic Note  Dean Cole is a 69 y.o. male admitted on 10/04/2019 with sepsis.  Pharmacy has been consulted for Zosyn dosing.  Plan: Zosyn 3.375gm IV q8h Will f/u renal function, micro data, and pt's clinical condition  Height: 5\' 10"  (177.8 cm) Weight: 79.8 kg (176 lb) IBW/kg (Calculated) : 73  Temp (24hrs), Avg:100.6 F (38.1 C), Min:100.6 F (38.1 C), Max:100.6 F (38.1 C)  Recent Labs  Lab 10/04/19 2113 10/04/19 2200 10/04/19 2201 10/05/19 0228  WBC 8.4 7.1  --  6.5  CREATININE 1.06  --   --   --   LATICACIDVEN  --   --  2.3*  --     Estimated Creatinine Clearance: 68.9 mL/min (by C-G formula based on SCr of 1.06 mg/dL).    Allergies  Allergen Reactions   Eggs Or Egg-Derived Products Other (See Comments)    Bloating, nausea, and no rash    Adhesive [Tape] Swelling    Antimicrobials this admission: 6/5 Ceftriaxone x 1 6/6 Zosyn >>   Microbiology results: 6/5 BCx:  6/5 UCx:    Thank you for allowing pharmacy to be a part of this patients care.  Sherlon Handing, PharmD, BCPS Please see amion for complete clinical pharmacist phone list 10/05/2019 2:51 AM

## 2019-10-05 NOTE — H&P (Signed)
History and Physical    Dean Cole KGU:[id number] DOB: November 23, 1950 DOA: 10/04/2019  PCP: [name], MD (Confirm with patient/family/NH records and if not entered, this has to be entered at [facility] point of entry) Patient coming from: Home  I have personally briefly reviewed patient's old medical records in [place]  Chief Complaint: Worsening confusion and ambulatory dysfunction.  HPI: Dean Cole is a 69 y.o. male with medical history significant of hypertension, hyperlipidemia, prostate cancer, hypothyroidism and anxiety/depression is brought to ED for evaluation of progressively worsening confusion and difficulty in ambulation.  Patient's wife is present at the bedside who is helping with HPI questions.  Patient is awake, alert but not oriented to time person and place.  Patient is able to answer review of system questions appropriately.  According to the wife, patient recently started having confusion and shuffling gait with difficult ambulation but today he became extremely confused therefore he is brought to the ED for evaluation.  Patient's wife reported that patient is complaining of severe dysuria and having urinary urgency for the last 3 to 4 days and also looks warm on touch.  Patient admits of having suprapubic pain and dysuria but denies headache, dizziness, chest pain, shortness of breath, nausea, vomiting and anxiety.  ED Course: On arrival to the ED patient had temperature of 100.6, heart rate 101, respiratory rate 26, blood pressure 144/75 sitting saturation 97% on room air.  Blood work showed WBC 7.1, hemoglobin 12, platelets 100 sodium 137, potassium 3.6, BUN 10, creatinine 1.06 and blood glucose 186 and lactic acid 2.3.  Weight 19 result came back negative.  UA also negative for UTI but patient is having symptoms of dysuria and urinary urgency while chest x-ray negative for pneumonia.  CT head negative for acute intracranial bleed or pathology but showed chronic  atrophic changes.  Patient was meeting the sepsis criteria and the most probable source was urine because of dysuria and urinary urgency.  In ED patient was given a bolus of IV normal saline and IV ceftriaxone  Review of Systems: As per HPI otherwise 10 point review of systems negative.  Unacceptable ROS statements: "10 systems reviewed," "Extensive" (without elaboration).  Acceptable ROS statements: "All others negative," "All others reviewed and are negative," and "All others unremarkable," with at [place] documented Can't double dip - if using for HPI can't use for ROS  Past Medical History:  Diagnosis Date  . Anxiety   . Cancer ([place])    skin cancer  basel cell carcinoma  . Cholecystitis   . Controlled type 2 DM with peripheral circulatory disorder ([place]) 09/20/2014  . Coronary artery disease   . Depression   . Headache(784.0)   . HTN (hypertension)   . Hx of skin cancer, basal cell   . Hyperlipidemia   . Hypothyroidism   . PAD (peripheral artery disease) ([place]) 09/20/2014  . Prostate cancer ([place])   . Restless leg syndrome   . Skin cancer    melanoma    Past Surgical History:  Procedure Laterality Date  . BASAL CELL CARCINOMA EXCISION  2000   Dr,Arkin   . CARDIAC CATHETERIZATION  07/07/2011  . CHOLECYSTECTOMY  09/14/2011   Procedure: LAPAROSCOPIC CHOLECYSTECTOMY WITH INTRAOPERATIVE CHOLANGIOGRAM;  Surgeon: Gwenyth Ober, MD;  Location: [place];  Service: General;  Laterality: N/A;  . CORONARY ARTERY BYPASS GRAFT  07/10/2011   Procedure: CORONARY ARTERY BYPASS GRAFTING (CABG);  Surgeon: Ivin Poot, MD;  Location: [place];  Service: Open Heart Surgery;  Laterality: N/A;  . EXCISIONAL HEMORRHOIDECTOMY  1997  . HEMORROIDECTOMY  2000  . LEFT HEART CATHETERIZATION WITH CORONARY ANGIOGRAM N/A 07/07/2011   Procedure: LEFT HEART CATHETERIZATION WITH CORONARY ANGIOGRAM;  Surgeon: Burnell Blanks, MD;  Location: [facility] CATH LAB;  Service: Cardiovascular;  Laterality: N/A;      reports that he quit smoking about 38 years ago. His smoking use included cigarettes. He has a 21.00 pack-year smoking history. He has quit using smokeless tobacco. He reports that he does not drink alcohol or use drugs.  Allergies  Allergen Reactions  . Eggs Or Egg-Derived Products Other (See Comments)    Bloating, nausea, and no rash   . Adhesive [Tape] Swelling    Family History  Problem Relation Age of Onset  . Coronary artery disease Father   . Prostate cancer Father   . Leukemia Mother   . Suicidality Brother   . Coronary artery disease Paternal Grandfather   . Breast cancer Neg Hx   . Colon cancer Neg Hx   . Pancreatic cancer Neg Hx    Unacceptable: Noncontributory, unremarkable, or negative. Acceptable: (example)Family history negative for heart disease  Prior to Admission medications   Medication Sig Start Date End Date Taking? Authorizing Provider  aspirin EC 325 MG tablet Take 325 mg by mouth every morning.   Yes [provider]  Cholecalciferol (VITAMIN D3 PO) Take 2 tablets by mouth daily.   Yes [provider]  clotrimazole-betamethasone (LOTRISONE) cream APPLY TO AFFECTED AREA TWICE A DAY Patient taking differently: Apply 1 application topically 2 (two) times daily as needed (for skin irritation).  04/28/19  Yes Trula Slade, DPM  diazepam (VALIUM) 10 MG tablet Take 10 mg by mouth 2 (two) times daily. 09/22/19  Yes [provider]  gabapentin (NEURONTIN) 100 MG capsule TAKE 1 CAPSULE BY MOUTH THREE TIMES A DAY Patient taking differently: Take 100 mg by mouth 3 (three) times daily.  09/08/19  Yes Trula Slade, DPM  imipramine (TOFRANIL) 25 MG tablet Take 100 mg by mouth at bedtime.  12/23/18  Yes [provider]  levothyroxine (SYNTHROID) 175 MCG tablet Take 175 mcg by mouth daily before breakfast.  07/08/19  Yes [provider]  magnesium oxide (MAG-OX) 400 (241.3 Mg) MG tablet Take 1 tablet (400 mg total) by mouth  daily. Patient taking differently: Take 400 mg by mouth at bedtime.  07/08/19  Yes Black, Lezlie Octave, NP  metFORMIN (GLUCOPHAGE) 500 MG tablet Take 1 tablet (500 mg total) by mouth 2 (two) times daily with a meal. 08/12/15  Yes Mast, Man X, NP  metoprolol tartrate (LOPRESSOR) 25 MG tablet Take 25 mg by mouth 2 (two) times daily.   Yes [provider]  mupirocin ointment (BACTROBAN) 2 % Apply 1 application topically 2 (two) times daily. Patient taking differently: Apply 1 application topically 2 (two) times daily as needed (to affected area(s) on head).  01/10/19  Yes Trula Slade, DPM  MYRBETRIQ 25 MG TB24 tablet TAKE 1 TABLET BY MOUTH EVERY DAY Patient taking differently: Take 25 mg by mouth daily.  04/27/19  Yes Bruning, Ashlyn, PA-C  NITROSTAT 0.4 MG SL tablet Place 0.4 mg under the tongue every 5 (five) minutes as needed for chest pain. Reported on 10/26/2015 07/06/11  Yes [provider]  Nutritional Supplements (ENSURE PO) Take 1 Bottle by mouth in the morning, at noon, and at bedtime.   Yes [provider]  pentazocine-naloxone (TALWIN NX)  50-0.5 MG tablet Take 1 tablet by mouth in the morning and at bedtime.    Yes [provider]  simvastatin (ZOCOR) 20 MG tablet TAKE ONE TABLET BY MOUTH ONCE DAILY FOR CHOLESTEROL Patient taking differently: Take 20 mg by mouth daily. Take one tablet by mouth once daily for cholesterol 10/16/16  Yes Reed, Tiffany L, DO  tamsulosin (FLOMAX) 0.4 MG CAPS capsule Take 0.4 mg by mouth 2 (two) times daily.   Yes [provider]    Physical Exam: Vitals:   10/05/19 0100 10/05/19 0130 10/05/19 0200 10/05/19 0310  BP: (!) 148/74 (!) 167/98 (!) 150/79   Pulse: 89  91 94  Resp: (!) 22  20 18   Temp:      TempSrc:      SpO2: 95%  97% 95%  Weight:      Height:        Constitutional: NAD, calm, comfortable Vitals:   10/05/19 0100 10/05/19 0130 10/05/19 0200 10/05/19 0310  BP: (!) 148/74 (!) 167/98 (!) 150/79   Pulse:  89  91 94  Resp: (!) 22  20 18   Temp:      TempSrc:      SpO2: 95%  97% 95%  Weight:      Height:        General: Patient is awake and alert but not oriented to time person and place and also not in acute distress Eyes: PERRL, lids and conjunctivae normal ENMT: Mucous membranes are moist. Posterior pharynx clear of any exudate or lesions.Normal dentition.  Neck: normal, supple, no masses, no thyromegaly Respiratory: clear to auscultation bilaterally, no wheezing, no crackles. Normal respiratory effort. No accessory muscle use.  Cardiovascular: Regular rate and rhythm, no murmurs / rubs / gallops. No extremity edema. 2+ pedal pulses. No carotid bruits.  Abdomen: Abdomen is soft, nondistended but mild tenderness in suprapubic region.  No masses palpated. No hepatosplenomegaly. Bowel sounds positive.  Musculoskeletal: no clubbing / cyanosis. No joint deformity upper and lower extremities. Good ROM, no contractures. Normal muscle tone.  Skin: no rashes, lesions, ulcers. No induration Neurologic: Patient looks like pleasantly demented person but according to the wife this is acute problem.  Normal speech, cranial nerves II to XII grossly intact.  Muscular strength and sensations intact. Psychiatric: Poor judgment and insight. Alert and not oriented to time person and place. Normal mood.   (Anything < 9 systems with 2 bullets each down codes to level 1) (If patient refuses exam can't bill higher level) (Make sure to document decubitus ulcers present on admission -- if possible -- and whether patient has chronic indwelling catheter at time of admission)  Labs on Admission: I have personally reviewed following labs and imaging studies  CBC: Recent Labs  Lab 10/04/19 2113 10/04/19 2200 10/05/19 0228  WBC 8.4 7.1 6.5  NEUTROABS  --  4.7  --   HGB 13.7 12.0* 11.3*  HCT 41.2 35.5* 33.2*  MCV 87.7 86.6 86.9  PLT 123* 100* 90*   Basic Metabolic Panel: Recent Labs  Lab 10/04/19 2113  10/05/19 0228  NA 137 138  K 3.6 3.2*  CL 97* 102  CO2 29 27  GLUCOSE 186* 161*  BUN 10 10  CREATININE 1.06 0.90  CALCIUM 9.1 8.4*   GFR: Estimated Creatinine Clearance: 81.1 mL/min (by C-G formula based on SCr of 0.9 mg/dL). Liver Function Tests: Recent Labs  Lab 10/05/19 0048  AST 16  ALT 14  ALKPHOS 105  BILITOT 0.6  PROT 6.2*  ALBUMIN 3.3*   No results for input(s): LIPASE, AMYLASE in the last 168 hours. Recent Labs  Lab 10/05/19 0048  AMMONIA 25   Coagulation Profile: No results for input(s): INR, PROTIME in the last 168 hours. Cardiac Enzymes: No results for input(s): CKTOTAL, CKMB, CKMBINDEX, TROPONINI in the last 168 hours. BNP (last 3 results) No results for input(s): PROBNP in the last 8760 hours. HbA1C: No results for input(s): HGBA1C in the last 72 hours. CBG: Recent Labs  Lab 10/04/19 2137  GLUCAP 176*   Lipid Profile: No results for input(s): CHOL, HDL, LDLCALC, TRIG, CHOLHDL, LDLDIRECT in the last 72 hours. Thyroid Function Tests: No results for input(s): TSH, T4TOTAL, FREET4, T3FREE, THYROIDAB in the last 72 hours. Anemia Panel: No results for input(s): VITAMINB12, FOLATE, FERRITIN, TIBC, IRON, RETICCTPCT in the last 72 hours. Urine analysis:    Component Value Date/Time   COLORURINE YELLOW 10/04/2019 2156   APPEARANCEUR CLEAR 10/04/2019 2156   APPEARANCEUR Clear 10/26/2015 1638   LABSPEC 1.015 10/04/2019 2156   PHURINE 5.0 10/04/2019 2156   GLUCOSEU NEGATIVE 10/04/2019 2156   HGBUR NEGATIVE 10/04/2019 2156   BILIRUBINUR NEGATIVE 10/04/2019 2156   BILIRUBINUR Negative 10/26/2015 [place] 10/04/2019 2156   PROTEINUR NEGATIVE 10/04/2019 2156   UROBILINOGEN 1.0 09/11/2011 2335   NITRITE NEGATIVE 10/04/2019 2156   LEUKOCYTESUR NEGATIVE 10/04/2019 2156    Radiological Exams on Admission: CT Head Wo Contrast  Result Date: 10/04/2019 CLINICAL DATA:  Encephalopathy. Additional history provided: Urinary retention, chest  pain, confusion, unsteady gait, history of prostate cancer. EXAM: CT HEAD WITHOUT CONTRAST TECHNIQUE: Contiguous axial images were obtained from the base of the skull through the vertex without intravenous contrast. COMPARISON:  Head CT 07/06/2019 FINDINGS: Brain: There is stable cerebral atrophy which is advanced for age. Redemonstrated mild ill-defined hypoattenuation within the cerebral white matter which is nonspecific, but consistent with chronic small vessel ischemic disease. There is no acute intracranial hemorrhage. No demarcated cortical infarct. No extra-axial fluid collection. No evidence of intracranial mass. No midline shift. Vascular: No hyperdense vessel.  Atherosclerotic calcifications. Skull: Normal. Negative for fracture or focal lesion. Sinuses/Orbits: Visualized orbits show no acute finding. Trace ethmoid sinus mucosal thickening at the imaged levels. No significant mastoid effusion. IMPRESSION: No CT evidence of acute intracranial abnormality. Stable cerebral atrophy which is advanced for age. Mild chronic small vessel ischemic disease. Trace ethmoid sinus mucosal thickening at the imaged levels. Electronically Signed   By: Kellie Simmering DO   On: 10/04/2019 23:57   DG Chest Port 1 View  Result Date: 10/04/2019 CLINICAL DATA:  Chest pain and confusion. EXAM: PORTABLE CHEST 1 VIEW COMPARISON:  Radiographs and CT 07/06/2019 FINDINGS: Low lung volumes limit assessment. Post median sternotomy and CABG. Mild cardiomegaly is likely accentuated by technique. Bandlike scarring in the left lung base unchanged, there is increased atelectasis at the right lung base. Bronchovascular crowding versus vascular congestion. No pneumothorax or large pleural effusion. No acute osseous abnormalities are seen. IMPRESSION: 1. Low lung volumes with bronchovascular crowding versus vascular congestion. 2. Increased right lung base atelectasis. Left lung base scarring is unchanged from prior exam. Electronically Signed    By: Keith Rake M.D.   On: 10/04/2019 21:54      Assessment/Plan Principal Problem:   Acute metabolic encephalopathy Patient presented to the hospital with complaints of worsening confusion.  CT head negative for acute intracranial bleed or pathology before cerebral atrophy and mild chronic small vessel ischemic disease.  Acute confusion could be  due to sepsis, continuous use of Valium 10 mg twice daily or it could be new onset dementia.  Patient is scheduled for outpatient neurology consult.  Patient will most probably need outpatient MRI to rule out NPH because of new onset memory problem, urinary urgency and shuffling gait.  Hold home Valium at this time.  Active Problems: Sepsis Patient is meeting the sepsis criteria with fever of 100.6, heart rate 101, tachypnea with respiratory rate of 26 and lactic acid 2.3 but the source of infection is not clear.  Most probable source is urine because patient is complaining of urinary urgency and dysuria.  Patient was given IV ceftriaxone in the ED.  Patient is started on IV Zosyn.  Blood culture and urine cultures ordered.  Tylenol as needed for mild pain and fever  Hyperglycemia due to diabetes mellitus Moderate dose sliding scale insulin ordered Blood glucose monitoring and hypoglycemic protocol in place.    Hypothyroidism  Continue home medications  Diabetic neuropathy Continue home gabapentin  Hyperlipidemia Continue home simvastatin  Ambulatory dysfunction PT/OT consult ordered  BPH Continue tamsulosin  Hypertension Pressure stable Continue home medication     DVT prophylaxis: Lovenox Code Status: Full code Family Communication: Patient's wife present at the bedside Disposition Plan:  Consults called: PT/OT consult ordered for evaluation and treatment Admission status: Observation/telemetry   Edmonia Lynch MD [facility] Pager [phone]   If 7PM-7AM, please contact night-coverage [email or web address] Password  [facility]  10/05/2019, 5:23 AM

## 2019-10-05 NOTE — ED Notes (Signed)
Called [facility] to for Talwin med

## 2019-10-06 LAB — URINE CULTURE

## 2019-10-09 LAB — CULTURE, BLOOD (ROUTINE X 2)
Culture: NO GROWTH
Culture: NO GROWTH
Special Requests: ADEQUATE
Special Requests: ADEQUATE

## 2019-10-09 NOTE — Progress Notes (Signed)
NEUROLOGY CONSULTATION NOTE  DORELL GATLIN MRN: [id number] DOB: [birth year]  Referring provider: Minus Breeding, MD Primary care provider: Kathryne Eriksson, MD  Reason for consult:  Memory loss, gait abnormality  HISTORY OF PRESENT ILLNESS: Dean Cole. Hidalgo is a 69 year old right-handed male with HTN, type 2 diabetes, hypothyroidism, PAD, CAD and history of basal cell carcinoma and prostate cancer who presents for confusion and gait abnormality.  History supplemented by hospital and referring provider notes.  He is accompanied by his wife who also supplements history.  He has had short term memory problems and confusion for the past two years, which has progressively gotten worse.    He was hospitalized in March for acute metabolic encephalopathy due to sepsis of unknown source and possible enteritis versus colitis.  Ammonia was slightly elevated at 39.  B12 was 354, RPR non-reactive and TSH 6.509 with free T4 0.73.  CT head without contrast personally reviewed showed advanced atrophy with ex vacuo dilatation and mild chronic small vessel ischemic changes. CT cervical spine unremarkable.  Mental status returned to baseline at time of discharge.  He returned to the ED on 10/04/2019 for evaluation following a fall and increased confusion.  He was found to have a fever of 100.6.  However, no leukocytosis on CBC, CXR negative for pneumonia and UA, blood cultures, SARS Coronovirus were negative.  Lactic acid mildly elevated at 2.3 and ammonia 25.  B12 248, TSH 2.169 and RPR non-reactive.  CT head personally reviewed was stable.   He feels off balance for the past 2 years.  He has had several falls.  He says it is a dizziness but cannot elaborate.  He feels it in his head as well.    He has not driven since discharge from the hospital.  He never completely lived independently.  He was raised by his grandmother and lived with her up until he got married.  He never had to balance a checkbook.  His wife  always handled the bills.  He uses a credit card but never made improper charges.  He used to perform chores around the house but that has stopped over the past year.  He has always enjoyed tending the garden but has needed some assistance over the past year.  He bathes, dresses, and uses toilet himself.  He is able to fix himself a sandwich or use the microwave.  He has not had any reported hallucinations.    He has been on Valium for 30 years for anxiety.  It was recently increased to 10mg  at bedtime.  He has diabetic neuropathy.    He is a retired Clinical biochemist.  Sometimes in his dreams, he may act out pulling wires and has sometimes acted out fighting in his dreams.  He sometimes chokes on solid foods.  No double vision.  He is hard of hearing.  Highest level of education is 12th grade.    PAST MEDICAL HISTORY: Past Medical History:  Diagnosis Date  . Anxiety   . Cancer ([place])    skin cancer  basel cell carcinoma  . Cholecystitis   . Controlled type 2 DM with peripheral circulatory disorder ([place]) 09/20/2014  . Coronary artery disease   . Depression   . Headache(784.0)   . HTN (hypertension)   . Hx of skin cancer, basal cell   . Hyperlipidemia   . Hypothyroidism   . PAD (peripheral artery disease) ([place]) 09/20/2014  . Prostate cancer ([place])   . Restless leg syndrome   .  Skin cancer    melanoma    PAST SURGICAL HISTORY: Past Surgical History:  Procedure Laterality Date  . BASAL CELL CARCINOMA EXCISION  2000   Dr,Arkin   . CARDIAC CATHETERIZATION  07/07/2011  . CHOLECYSTECTOMY  09/14/2011   Procedure: LAPAROSCOPIC CHOLECYSTECTOMY WITH INTRAOPERATIVE CHOLANGIOGRAM;  Surgeon: Gwenyth Ober, MD;  Location: [facility];  Service: General;  Laterality: N/A;  . CORONARY ARTERY BYPASS GRAFT  07/10/2011   Procedure: CORONARY ARTERY BYPASS GRAFTING (CABG);  Surgeon: Ivin Poot, MD;  Location: [facility];  Service: Open Heart Surgery;  Laterality: N/A;  . EXCISIONAL HEMORRHOIDECTOMY  1997  .  HEMORROIDECTOMY  2000  . LEFT HEART CATHETERIZATION WITH CORONARY ANGIOGRAM N/A 07/07/2011   Procedure: LEFT HEART CATHETERIZATION WITH CORONARY ANGIOGRAM;  Surgeon: Burnell Blanks, MD;  Location: [facility] CATH LAB;  Service: Cardiovascular;  Laterality: N/A;    MEDICATIONS: Current Outpatient Medications on File Prior to Visit  Medication Sig Dispense Refill  . aspirin EC 325 MG tablet Take 325 mg by mouth every morning.    . Cholecalciferol (VITAMIN D3 PO) Take 2 tablets by mouth daily.    . clotrimazole-betamethasone (LOTRISONE) cream APPLY TO AFFECTED AREA TWICE A DAY (Patient taking differently: Apply 1 application topically 2 (two) times daily as needed (for skin irritation). ) 30 g 0  . diazepam (VALIUM) 10 MG tablet Take 10 mg by mouth 2 (two) times daily.    Marland Kitchen gabapentin (NEURONTIN) 100 MG capsule TAKE 1 CAPSULE BY MOUTH THREE TIMES A DAY (Patient taking differently: Take 100 mg by mouth 3 (three) times daily. ) 90 capsule 3  . imipramine (TOFRANIL) 25 MG tablet Take 100 mg by mouth at bedtime.     Marland Kitchen levothyroxine (SYNTHROID) 175 MCG tablet Take 175 mcg by mouth daily before breakfast.     . magnesium oxide (MAG-OX) 400 (241.3 Mg) MG tablet Take 1 tablet (400 mg total) by mouth daily. (Patient taking differently: Take 400 mg by mouth at bedtime. ) 30 tablet 0  . metFORMIN (GLUCOPHAGE) 500 MG tablet Take 1 tablet (500 mg total) by mouth 2 (two) times daily with a meal. 60 tablet 2  . metoprolol tartrate (LOPRESSOR) 25 MG tablet Take 25 mg by mouth 2 (two) times daily.    . mupirocin ointment (BACTROBAN) 2 % Apply 1 application topically 2 (two) times daily. (Patient taking differently: Apply 1 application topically 2 (two) times daily as needed (to affected area(s) on head). ) 30 g 2  . MYRBETRIQ 25 MG TB24 tablet TAKE 1 TABLET BY MOUTH EVERY DAY (Patient taking differently: Take 25 mg by mouth daily. ) 30 tablet 2  . NITROSTAT 0.4 MG SL tablet Place 0.4 mg under the tongue every 5 (five)  minutes as needed for chest pain. Reported on 10/26/2015    . Nutritional Supplements (ENSURE PO) Take 1 Bottle by mouth in the morning, at noon, and at bedtime.    . pentazocine-naloxone (TALWIN NX) 50-0.5 MG tablet Take 1 tablet by mouth in the morning and at bedtime.     . simvastatin (ZOCOR) 20 MG tablet TAKE ONE TABLET BY MOUTH ONCE DAILY FOR CHOLESTEROL (Patient taking differently: Take 20 mg by mouth daily. Take one tablet by mouth once daily for cholesterol) 90 tablet 1  . tamsulosin (FLOMAX) 0.4 MG CAPS capsule Take 0.4 mg by mouth 2 (two) times daily.     No current facility-administered medications on file prior to visit.    ALLERGIES: Allergies  Allergen Reactions  .  Eggs Or Egg-Derived Products Other (See Comments)    Bloating, nausea, and no rash   . Adhesive [Tape] Swelling    FAMILY HISTORY: Family History  Problem Relation Age of Onset  . Coronary artery disease Father   . Prostate cancer Father   . Leukemia Mother   . Suicidality Brother   . Coronary artery disease Paternal Grandfather   . Breast cancer Neg Hx   . Colon cancer Neg Hx   . Pancreatic cancer Neg Hx    SOCIAL HISTORY: Social History   Socioeconomic History  . Marital status: Married    Spouse name: Not on file  . Number of children: 2  . Years of education: Not on file  . Highest education level: Not on file  Occupational History    Comment: retired  Tobacco Use  . Smoking status: Former Smoker    Packs/day: 1.00    Years: 21.00    Pack years: 21.00    Types: Cigarettes    Quit date: 08/08/1981    Years since quitting: 38.1  . Smokeless tobacco: Former Network engineer  . Vaping Use: Never used  Substance and Sexual Activity  . Alcohol use: No    Comment: former  . Drug use: No  . Sexual activity: Yes  Other Topics Concern  . Not on file  Social History Narrative  . Not on file   Social Determinants of Health   Financial Resource Strain:   . Difficulty of Paying Living  Expenses:   Food Insecurity:   . Worried About Charity fundraiser in the Last Year:   . Arboriculturist in the Last Year:   Transportation Needs:   . Film/video editor (Medical):   Marland Kitchen Lack of Transportation (Non-Medical):   Physical Activity:   . Days of Exercise per Week:   . Minutes of Exercise per Session:   Stress:   . Feeling of Stress :   Social Connections:   . Frequency of Communication with Friends and Family:   . Frequency of Social Gatherings with Friends and Family:   . Attends Religious Services:   . Active Member of Clubs or Organizations:   . Attends Archivist Meetings:   Marland Kitchen Marital Status:   Intimate Partner Violence:   . Fear of Current or Ex-Partner:   . Emotionally Abused:   Marland Kitchen Physically Abused:   . Sexually Abused:     PHYSICAL EXAM: Blood pressure 120/60, pulse 67, height 5\' 10"  (1.778 m), weight 182 lb 9.6 oz (82.8 kg), SpO2 95 %. General: No acute distress.  Patient appears well-groomed.   Head:  Normocephalic/atraumatic Eyes:  fundi examined but not visualized Neck: supple, no paraspinal tenderness, full range of motion Back: No paraspinal tenderness Heart: regular rate and rhythm Lungs: Clear to auscultation bilaterally. Vascular: No carotid bruits. Neurological Exam: Mental status: alert [employer] Mental Exam 10/13/2019  Weekday Correct 0  Current year 0  What state are we in? 0  Amount spent 0  Amount left 0  # of Animals 2  5 objects recall 0  Number series 2  Hour markers 0  Time correct 0  Placed X in triangle correctly 1  Largest Figure 1  Name of male 0  Date back to work 0  Type of work 0  State she lived in 0  Total score 6   Cranial nerves: CN I: not tested CN II: pupils equal, round and reactive to light, visual  fields intact CN III, IV, VI:  full range of motion, no nystagmus, no ptosis CN V: facial sensation intact CN VII: upper and lower face symmetric CN VIII: decreased hearing  bilaterally CN IX, X: gag intact, uvula midline CN XI: sternocleidomastoid and trapezius muscles intact CN XII: tongue midline Bulk & Tone: normal, no fasciculations. Motor:  5/5 throughout  Sensation:  Pinprick and vibration sensation reduced in feet Deep Tendon Reflexes:  2+ throughout except absent in ankles, toes downgoing.  Finger to nose testing:  Without dysmetria.  Heel to shin:  Without dysmetria.  Gait:  Upright posture, reduced arm swing, mildly unsteady gait with good stride.  Able turn.  Able to tandem walk.  Romberg negative.  IMPRESSION: 1.  Major neurocognitive disorder.  Likely Alzheimer's, may be vascular component as well (as demonstrated on CT head). 2.  Unsteady gait.  Multifactorial related to underlying diabetic neuropathy, dizziness, pharmacologic effect.   PLAN: 1.  Will check MRI of brain without contrast to further evaluate extent of cerebrovascular disease. 2.  Neuropsychological testing. 3.  Start donepezil 5mg  at bedtime.  Increase to 10mg  at bedtime in one month. 4.  No driving. 5.  Follow up after testing.  Thank you for allowing me to take part in the care of this patient.  Metta Clines, DO  CC:  Kathryne Eriksson, MD  Minus Breeding, MD

## 2019-10-12 ENCOUNTER — Other Ambulatory Visit: Payer: Self-pay | Admitting: Podiatry

## 2019-10-13 ENCOUNTER — Ambulatory Visit: Payer: Medicare HMO | Admitting: Neurology

## 2019-10-13 ENCOUNTER — Encounter: Payer: Self-pay | Admitting: Neurology

## 2019-10-13 ENCOUNTER — Other Ambulatory Visit: Payer: Self-pay

## 2019-10-13 VITALS — BP 120/60 | HR 67 | Ht 70.0 in | Wt 182.6 lb

## 2019-10-13 DIAGNOSIS — R2681 Unsteadiness on feet: Secondary | ICD-10-CM | POA: Diagnosis not present

## 2019-10-13 DIAGNOSIS — F0390 Unspecified dementia without behavioral disturbance: Secondary | ICD-10-CM

## 2019-10-13 DIAGNOSIS — F0281 Dementia in other diseases classified elsewhere with behavioral disturbance: Secondary | ICD-10-CM

## 2019-10-13 MED ORDER — DONEPEZIL HCL 5 MG PO TABS
5.0000 mg | ORAL_TABLET | Freq: Every day | ORAL | 0 refills | Status: DC
Start: 2019-10-13 — End: 2019-11-04

## 2019-10-13 NOTE — Patient Instructions (Addendum)
1.  We will start donepezil (Aricept) 5mg  daily for four weeks.  If you are tolerating the medication, then after four weeks, we will increase the dose to 10mg  daily.  Side effects include nausea, vomiting, diarrhea, vivid dreams, and muscle cramps.  Please call the clinic if you experience any of these symptoms. 2.  MRI of brain without contrast. We have sent a referral to [facility] for your MRI and they will call you directly to schedule your appointment. They are located at [place]. If you need to contact them directly please call [phone].  3.  Neuropsychological evaluation.  4.  No driving 5.  Follow up after testing.

## 2019-10-22 ENCOUNTER — Encounter: Payer: Self-pay | Admitting: Nurse Practitioner

## 2019-10-22 ENCOUNTER — Ambulatory Visit: Payer: Medicare HMO | Admitting: Nurse Practitioner

## 2019-10-22 ENCOUNTER — Other Ambulatory Visit (INDEPENDENT_AMBULATORY_CARE_PROVIDER_SITE_OTHER): Payer: Medicare HMO

## 2019-10-22 VITALS — BP 120/62 | HR 76 | Ht 70.0 in | Wt 186.2 lb

## 2019-10-22 DIAGNOSIS — R197 Diarrhea, unspecified: Secondary | ICD-10-CM

## 2019-10-22 DIAGNOSIS — R1310 Dysphagia, unspecified: Secondary | ICD-10-CM

## 2019-10-22 DIAGNOSIS — D649 Anemia, unspecified: Secondary | ICD-10-CM

## 2019-10-22 LAB — BASIC METABOLIC PANEL
BUN: 9 mg/dL (ref 6–23)
CO2: 33 mEq/L — ABNORMAL HIGH (ref 19–32)
Calcium: 8.8 mg/dL (ref 8.4–10.5)
Chloride: 98 mEq/L (ref 96–112)
Creatinine, Ser: 0.85 mg/dL (ref 0.40–1.50)
GFR: 89.45 mL/min (ref >60.00)
Glucose, Bld: 275 mg/dL — ABNORMAL HIGH (ref 70–99)
Potassium: 3.1 mEq/L — ABNORMAL LOW (ref 3.5–5.1)
Sodium: 139 mEq/L (ref 135–145)

## 2019-10-22 LAB — FERRITIN: Ferritin: 72.2 ng/mL (ref 22.0–322.0)

## 2019-10-22 LAB — CBC
HCT: 35.8 % — ABNORMAL LOW (ref 39.0–52.0)
Hemoglobin: 12.5 g/dL — ABNORMAL LOW (ref 13.0–17.0)
MCHC: 34.9 g/dL (ref 30.0–36.0)
MCV: 85.8 fl (ref 78.0–100.0)
Platelets: 114 10*3/uL — ABNORMAL LOW (ref 150.0–400.0)
RBC: 4.18 Mil/uL — ABNORMAL LOW (ref 4.22–5.81)
RDW: 15.3 % (ref 11.5–15.5)
WBC: 4.7 10*3/uL (ref 4.0–10.5)

## 2019-10-22 LAB — TSH: TSH: 0.31 u[IU]/mL — ABNORMAL LOW (ref 0.35–4.50)

## 2019-10-22 LAB — C-REACTIVE PROTEIN: CRP: <1 mg/dL (ref 0.5–20.0)

## 2019-10-22 LAB — IGA: IgA: 357 mg/dL (ref 68–378)

## 2019-10-22 NOTE — Progress Notes (Signed)
10/22/2019 SAXON BARICH [id number] 01-16-1951   CHIEF COMPLAINT: discuss scheduling a colonoscopy   HISTORY OF PRESENT ILLNESS:  Dean Cole is a 69 year old male with a past medical history of anxiety, depression, hypertension, coronary artery disease s/p 6 vessel CABG in 2013, diabetes mellitus type 2, hypothyroidism, thrombocytopenia, melanoma, prostate cancer s/p radiation tx completed 11/2018,  kidney stones and pneumonia 05/2019.  S/P cholecystectomy in 2013.  He was admitted to [facility] 07/06/2019 with N/V/D and headaches with reports of recent falls at home. He was diagnosed with with acute metabolic encephalopathy in the setting of enteritis/colitis per CTAP and AKI.  C. difficile and GI pathogen panel were negative.  He was evaluated by Dr. Therisa Doyne with [facility]. A colonoscopy was offered during his hospitalization which he refused. See CTAP results below. He was discharged home 07/08/2019 on Cipro and Flagyl po x 8 days. His diarrhea improved. No further N/V.   He was admitted to [facility] on 10/04/2019 with altered mental status with the onset of a shuffling gait.  CT of the head did not identify any acute findings.  Potential concern for sepsis with a mild fever,  UA was negative and  chest x-ray did not demonstrate evidence of pneumonia.  He received Rocephin and Zosyn in the ED.  He did not wish to be admitted for further neurological evaluation and he was discharged home.  He was evaluated by neurologist Dr. Metta Clines on 10/13/2019.  He was assessed to most likely have Alzheimer's.  His unstable gait was considered multifactorial due to underlying diabetic neuropathy, dizziness and polypharmacy.  Brain MRI was ordered but not yet completed.  He was started on Donepezil 5 mg at bedtime.  He was advised not to drive.  He presents to our office today accompanied by his wife for further evaluation regarding his diagnosis of enteritis/colitis  during his hospital admission March 2021 as noted above.  His wife is assisting with obtaining his history as he has memory issues.  He continues to have intermittent loose stools once daily, no watery diarrhea.  He is able to pass a normal formed stool once weekly.  His stools are somewhat looser since starting Donepezil last week. No watery diarrhea. No rectal bleeding.  No melena.  He underwent a colonoscopy in 2005 which she reported was normal.  He denies having any heartburn.  He has dysphagia if he eats steak.  He will consider scheduling a colonoscopy if absolutely necessary.  He does not wish to pursue an EGD as he is not concerned regarding his dysphagia symptoms.  No further nausea or vomiting.  No known family history of IBD or colon cancer.  Mother had leukemia.  Has a history of thrombocytopenia of unclear etiology.  He has hepatic steatosis without evidence of cirrhosis by abdominal imaging.  He reported drinking heavily from the age of 50 to his early 60s.  No alcohol for the past 37 years.  Labs 10/05/2019: WBC 6.5.  Hemoglobin 11.3.  Hematocrit 33.2.  Platelet 90.  Kos 161.  BUN 10.  Creatinine 0.90.  Sodium 138.  Potassium 3.2.  Alk phos 105.  AST 16.  ALT 14.  Total bili 0.6.  Chest CT 07/06/2019: 1. No acute cardiopulmonary disease. 2. Post median sternotomy and CABG. Calcifications within native coronary arteries. Aortic Atherosclerosis (ICD10-I70.0). 3. Bilateral punctate pulmonary nodules, largest of which within the right upper lobe measures 0.5 cm in diameter. No follow-up needed  if patient is low-risk (and has no known or suspected primary neoplasm). Non-contrast chest CT can be considered in 12 months if patient is high-risk  Abdominal/pelvic CT scan 07/06/2019: 1. Apparent circumferential wall thickening involving the sigmoid colon extending with patulous distension of the upstream colon, though not resulting in enteric obstruction. Findings are nonspecific though could be  seen in the setting of an enteritis. Further evaluation with colonoscopy could be performed as indicated. 2. Mild circumferential wall thickening involving several loops of mid small bowel, not resulting in enteric obstruction, similar to the 06/2018 examination while potentially secondary to peristalsis though additional location of an enteritis could have a similar appearance. 3. Redemonstrated 0.6 cm urinary bladder calculus, similar to the 06/2018 examination. 4. Redemonstrated solitary punctate (2 mm) nonobstructing bilateral renal stones. 5. Suspected hepatic steatosis.  Correlation with LFTs is advised  Past Medical History:  Diagnosis Date  . Anxiety   . Basal cell carcinoma   . Cholecystitis   . Controlled type 2 DM with peripheral circulatory disorder ([place]) 09/20/2014  . Coronary artery disease   . Depression   . Headache(784.0)   . HTN (hypertension)   . Hx of skin cancer, basal cell   . Hyperlipidemia   . Hypothyroidism   . Kidney stones   . Melanoma ([place])   . PAD (peripheral artery disease) ([place]) 09/20/2014  . Prostate cancer ([place])   . Restless leg syndrome   . Sepsis [facility])     Past Medical History:  Diagnosis Date  . Anxiety   . Basal cell carcinoma   . Cholecystitis   . Controlled type 2 DM with peripheral circulatory disorder ([place]) 09/20/2014  . Coronary artery disease   . Depression   . Headache(784.0)   . HTN (hypertension)   . Hx of skin cancer, basal cell   . Hyperlipidemia   . Hypothyroidism   . Kidney stones   . Melanoma ([place])   . PAD (peripheral artery disease) ([place]) 09/20/2014  . Prostate cancer ([place])   . Restless leg syndrome   . Sepsis [facility])    Past Surgical History:  Procedure Laterality Date  . BASAL CELL CARCINOMA EXCISION  2000   Dr,Arkin   . CARDIAC CATHETERIZATION  07/07/2011  . CHOLECYSTECTOMY  09/14/2011   Procedure: LAPAROSCOPIC CHOLECYSTECTOMY WITH INTRAOPERATIVE CHOLANGIOGRAM;  Surgeon: Gwenyth Ober, MD;  Location: [facility];   Service: General;  Laterality: N/A;  . CORONARY ARTERY BYPASS GRAFT  07/10/2011   Procedure: CORONARY ARTERY BYPASS GRAFTING (CABG);  Surgeon: Ivin Poot, MD;  Location: [facility];  Service: Open Heart Surgery;  Laterality: N/A;  . EXCISIONAL HEMORRHOIDECTOMY  1997  . HEMORROIDECTOMY  2000  . LEFT HEART CATHETERIZATION WITH CORONARY ANGIOGRAM N/A 07/07/2011   Procedure: LEFT HEART CATHETERIZATION WITH CORONARY ANGIOGRAM;  Surgeon: Burnell Blanks, MD;  Location: [facility] CATH LAB;  Service: Cardiovascular;  Laterality: N/A;    reports that he quit smoking about 38 years ago. His smoking use included cigarettes. He has a 21.00 pack-year smoking history. He has quit using smokeless tobacco. He reports that he does not drink alcohol and does not use drugs. family history includes Coronary artery disease in his father and paternal grandfather; Diabetes in his father; Drug abuse in his brother; Leukemia in his mother; Prostate cancer in his father; Suicidality in his brother. Allergies  Allergen Reactions  . Eggs Or Egg-Derived Products Other (See Comments)    Bloating, nausea, and no rash   . Adhesive [Tape] Swelling  Outpatient Encounter Medications as of 10/22/2019  Medication Sig  . aspirin EC 325 MG tablet Take 325 mg by mouth every morning.  . Cholecalciferol (VITAMIN D3 PO) Take 2 tablets by mouth daily.  . clotrimazole-betamethasone (LOTRISONE) cream Apply 1 application topically 2 (two) times daily as needed (for skin irritation).  . diazepam (VALIUM) 10 MG tablet Take 10 mg by mouth 2 (two) times daily.  Marland Kitchen donepezil (ARICEPT) 5 MG tablet Take 1 tablet (5 mg total) by mouth at bedtime.  . gabapentin (NEURONTIN) 100 MG capsule TAKE 1 CAPSULE BY MOUTH THREE TIMES A DAY (Patient taking differently: Take 100 mg by mouth 3 (three) times daily. )  . imipramine (TOFRANIL) 25 MG tablet Take 100 mg by mouth at bedtime.   Marland Kitchen levothyroxine (SYNTHROID) 175 MCG tablet Take 175 mcg by mouth daily  before breakfast.   . magnesium oxide (MAG-OX) 400 (241.3 Mg) MG tablet Take 1 tablet (400 mg total) by mouth daily. (Patient taking differently: Take 400 mg by mouth at bedtime. )  . metFORMIN (GLUCOPHAGE) 500 MG tablet Take 1 tablet (500 mg total) by mouth 2 (two) times daily with a meal.  . metoprolol tartrate (LOPRESSOR) 25 MG tablet Take 25 mg by mouth 2 (two) times daily.  . mupirocin ointment (BACTROBAN) 2 % Apply 1 application topically 2 (two) times daily. (Patient taking differently: Apply 1 application topically 2 (two) times daily as needed (to affected area(s) on head). )  . MYRBETRIQ 25 MG TB24 tablet TAKE 1 TABLET BY MOUTH EVERY DAY (Patient taking differently: Take 25 mg by mouth daily. )  . Nutritional Supplements (ENSURE PO) Take 1 Bottle by mouth in the morning, at noon, and at bedtime.  . pentazocine-naloxone (TALWIN NX) 50-0.5 MG tablet Take 1 tablet by mouth in the morning and at bedtime.   . simvastatin (ZOCOR) 20 MG tablet TAKE ONE TABLET BY MOUTH ONCE DAILY FOR CHOLESTEROL (Patient taking differently: Take 20 mg by mouth daily. Take one tablet by mouth once daily for cholesterol)  . tamsulosin (FLOMAX) 0.4 MG CAPS capsule Take 0.4 mg by mouth 2 (two) times daily.  Marland Kitchen NITROSTAT 0.4 MG SL tablet Place 0.4 mg under the tongue every 5 (five) minutes as needed for chest pain. Reported on 10/26/2015 (Patient not taking: Reported on 10/22/2019)   No facility-administered encounter medications on file as of 10/22/2019.     REVIEW OF SYSTEMS: All other systems reviewed and negative except where noted in the History of Present Illness.    PHYSICAL EXAM: BP 120/62 (BP Location: Left Arm, Patient Position: Sitting, Cuff Size: Normal)   Pulse 76   Ht 5' 10"  (1.778 m) Comment: height measured without shoes  Wt 186 lb 4 oz (84.5 kg)   BMI 26.72 kg/m    General: 69 year old HOH male in no acute distress. Head: Normocephalic and atraumatic. Eyes:  Sclerae non-icteric, conjunctive  pink. Ears: Normal auditory acuity. Mouth: Upper and lower dentures intact.  No ulcers or lesions.  Neck: Supple, no lymphadenopathy or thyromegaly.  Lungs: Clear bilaterally to auscultation without wheezes, crackles or rhonchi. Heart: Regular rate and rhythm. No murmur, rub or gallop appreciated.  Abdomen: Soft, nontender, non distended. No masses. No hepatosplenomegaly. Normoactive bowel sounds x 4 quadrants.  Rectal: Deferred.  Musculoskeletal: Symmetrical with no gross deformities. Skin: Warm and dry. No rash or lesions on visible extremities. Extremities: No edema. Neurological: Alert oriented x 4, no focal deficits.  Psychological:  Alert and cooperative. Normal mood and affect.  ASSESSMENT AND PLAN:  58. 69 year old male with enteritis/colitis per CTAP 06/2019 presents today to discuss scheduling a possible diagnostic colonoscopy.  Last screening colonoscopy was reported in 2005. -Diagnostic colonoscopy deferred at this time due to ongoing neurological evaluation.  If his diarrhea persists or worsens to consider a diagnostic colonoscopy after neuro evaluation/brain MRI completed. I discussed Donepezil may also contribute to his diarrhea. If his diarrhea worsens, may need to stop Donepezil.  -Limit dairy products -Bacteria probiotic of choice once daily  2.  Dysphagia with meat only.  -Patient does not wish to pursue an EGD unless medically necessary  3.  Normocytic Anemia -CBC, iron, iron saturation, TIBC and Ferritin   4. Thrombocytopenia, etiology unclear. Normal LFTS. CTAP shows possible hepatic steatosis without  Evidence of cirrhosis.   5. History of prostate cancer s/p radiation tx completed 11/2018  6. Altered mental status, frequent falls, possible Alzheimer's disease undergoing neuro evaluation    CC:  [name], MD

## 2019-10-22 NOTE — Patient Instructions (Addendum)
If you are age 69 or older, your body mass index should be between 23-30. Your Body mass index is 26.72 kg/m. If this is out of the aforementioned range listed, please consider follow up with your Primary Care Provider.  If you are age 97 or younger, your body mass index should be between 19-25. Your Body mass index is 26.72 kg/m. If this is out of the aformentioned range listed, please consider follow up with your Primary Care Provider.   Your provider has requested that you go to the basement level for lab work before leaving today. Press "B" on the elevator. The lab is located at the first door on the left as you exit the elevator.  1. Call the office if your diarrhea worsens. 2. Avoid large pieces of steak and chicken. 3. Take a phillips probiotic once daily. 4. Schedule and egd/colonoscopy after your Neurology consultation.   Due to recent changes in healthcare laws, you may see the results of your imaging and laboratory studies on MyChart before your provider has had a chance to review them.  We understand that in some cases there may be results that are confusing or concerning to you. Not all laboratory results come back in the same time frame and the provider may be waiting for multiple results in order to interpret others.  Please give [place] 48 hours in order for your provider to thoroughly review all the results before contacting the office for clarification of your results.

## 2019-10-23 LAB — IRON, TOTAL/TOTAL IRON BINDING CAP
%SAT: 25 % (calc) (ref 20–48)
Iron: 67 ug/dL (ref 50–180)
TIBC: 272 mcg/dL (calc) (ref 250–425)

## 2019-10-23 LAB — TISSUE TRANSGLUTAMINASE, IGA: (tTG) Ab, IgA: 1 U/mL

## 2019-10-23 NOTE — Progress Notes (Signed)
Reviewed and agree with management plan.  Jeanetta Alonzo T. Magdalena Skilton, MD FACG [facility]  

## 2019-10-27 ENCOUNTER — Other Ambulatory Visit: Payer: Self-pay

## 2019-10-27 ENCOUNTER — Ambulatory Visit: Payer: Medicare HMO | Admitting: Podiatry

## 2019-10-27 ENCOUNTER — Encounter: Payer: Self-pay | Admitting: Podiatry

## 2019-10-27 VITALS — Temp 97.1°F

## 2019-10-27 DIAGNOSIS — S90861A Insect bite (nonvenomous), right foot, initial encounter: Secondary | ICD-10-CM

## 2019-10-27 DIAGNOSIS — W57XXXA Bitten or stung by nonvenomous insect and other nonvenomous arthropods, initial encounter: Secondary | ICD-10-CM

## 2019-10-27 DIAGNOSIS — S90821A Blister (nonthermal), right foot, initial encounter: Secondary | ICD-10-CM | POA: Diagnosis not present

## 2019-10-27 NOTE — Progress Notes (Signed)
  Subjective:  Patient ID: Dean Cole, male    DOB: 1951/01/16,  MRN: [id number]  Chief Complaint  Patient presents with  . Foot Problem    Blister - R dorsal forefoot submet 4-5. x2-3 days. Wife stated, "No injuries. It was warm, swollen, and red up into his leg this morning. He had slept in the recliner". No redness or warmth at this time. Pt added, "No pain. I was trimming briars the other day, and one of the briars got me".    69 y.o. male presents for wound care.  History was confirmed and reviewed with the patient and his wife today.  His wife notes that there was a blister that formed on the dorsal lateral right foot where he lives like a bug bite at form.  The blister had flattened out begin to fill back up with clear fluid.  She states that it was very red around it in a circular pattern.   Review of Systems: Negative except as noted in the HPI. Denies N/V/F/Ch.  Objective:   Vitals:   10/27/19 1512  Temp: (!) 97.1 F (36.2 C)   There is no height or weight on file to calculate BMI.  Right lower extremity focused examination: On the dorsal lateral right foot there is a small serous bulla overlying the fourth metatarsal.  Minor fluctuance within this.  No surrounding cellulitis, though there is dry flaking skin.  No malodor or deep wound noted.  Assessment:   1. Insect bite of right foot, initial encounter   2. Blister of right foot, initial encounter    Plan:  Patient was evaluated and treated and all questions answered.  -I discussed my concern for the possibility of an insect bite, and the possibility of a tick bite or Lyme disease with the patient and his wife.  I ordered a CT erythrocyte sedimentation rate and a Lyme titer panel to rule this out. -Following sterile prep with alcohol the bulla was lanced with a 20-gauge needle without complication.  Clear serous fluid drained from the lesion.  Roof was left intact. -He has an appointment upcoming with Dr. Earleen Newport  in July.  He will follow up with him unless he has abnormalities in his lab work, for which I will call him for.  In the event that his Lyme titer is positive I will refer him to infectious disease after a doxycycline treatment.  Lanae Crumbly, DPM 10/27/2019     Return if symptoms worsen or fail to improve, for at scheduled appointment with Dr Jacqualyn Posey unless he worsens.

## 2019-10-28 LAB — CBC WITH DIFFERENTIAL/PLATELET
Absolute Monocytes: 464 cells/uL (ref 200–950)
Basophils Absolute: 31 cells/uL (ref 0–200)
Basophils Relative: 0.5 %
Eosinophils Absolute: 189 cells/uL (ref 15–500)
Eosinophils Relative: 3.1 %
HCT: 37.5 % — ABNORMAL LOW (ref 38.5–50.0)
Hemoglobin: 13 g/dL — ABNORMAL LOW (ref 13.2–17.1)
Lymphs Abs: 1702 cells/uL (ref 850–3900)
MCH: 29.7 pg (ref 27.0–33.0)
MCHC: 34.7 g/dL (ref 32.0–36.0)
MCV: 85.8 fL (ref 80.0–100.0)
MPV: 10.4 fL (ref 7.5–12.5)
Monocytes Relative: 7.6 %
Neutro Abs: 3715 cells/uL (ref 1500–7800)
Neutrophils Relative %: 60.9 %
Platelets: 123 10*3/uL — ABNORMAL LOW (ref 140–400)
RBC: 4.37 10*6/uL (ref 4.20–5.80)
RDW: 14.5 % (ref 11.0–15.0)
Total Lymphocyte: 27.9 %
WBC: 6.1 10*3/uL (ref 3.8–10.8)

## 2019-10-28 LAB — B. BURGDORFI ANTIBODIES: B burgdorferi Ab IgG+IgM: <0.9 index

## 2019-10-28 LAB — SEDIMENTATION RATE: Sed Rate: 39 mm/h — ABNORMAL HIGH (ref 0–20)

## 2019-11-04 ENCOUNTER — Other Ambulatory Visit: Payer: Self-pay | Admitting: Neurology

## 2019-11-14 ENCOUNTER — Encounter: Payer: Self-pay | Admitting: Podiatry

## 2019-11-14 ENCOUNTER — Other Ambulatory Visit: Payer: Self-pay

## 2019-11-14 ENCOUNTER — Ambulatory Visit: Payer: Medicare HMO | Admitting: Podiatry

## 2019-11-14 DIAGNOSIS — L97301 Non-pressure chronic ulcer of unspecified ankle limited to breakdown of skin: Secondary | ICD-10-CM | POA: Diagnosis not present

## 2019-11-14 DIAGNOSIS — S90821A Blister (nonthermal), right foot, initial encounter: Secondary | ICD-10-CM

## 2019-11-14 MED ORDER — MUPIROCIN 2 % EX OINT
1.0000 | TOPICAL_OINTMENT | Freq: Two times a day (BID) | CUTANEOUS | 2 refills | Status: DC
Start: 2019-11-14 — End: 2021-11-24

## 2019-11-14 MED ORDER — DOXYCYCLINE HYCLATE 100 MG PO TABS: 100.0000 mg | ORAL_TABLET | Freq: Two times a day (BID) | ORAL | 0 refills | Status: DC

## 2019-11-14 NOTE — Progress Notes (Signed)
   Subjective: 69 year old male presents the office today with his wife for concerns of blisters on his lower extremities.  They are not sure what is causing this.  He recently was here a couple weeks ago to see Dr. Sherryle Lis and was concerned about a bug bite.  He states that he did not get any antibiotics after this.  That did resolve and this is a new issue. Denies any systemic complaints such as fevers, chills, nausea, vomiting. No acute changes since last appointment, and no other complaints at this time.   Objective: AAO x3 DP/PT pulses palpable bilaterally, CRT less than 3 seconds There are multiple superficial areas of skin breakdown along bilateral lower extremities but there is 1 area on the right medial ankle with some clear fluid, blister.  I did treat this today there is no purulence.  There is mild surrounding erythema to each of the lesions but there is no ascending cellulitis.  There is no fluctuation crepitation. No open lesions or pre-ulcerative lesions.  No pain with calf compression, swelling, warmth, erythema        Assessment: Blisters, superficial wounds.  Plan: -All treatment options discussed with the patient including all alternatives, risks, complications.  -On the right medial ankle is able to debride the wound and only is clear drainage expressed.  I did send this for culture today.  No other blisters are identified today.  Prescribed doxycycline as well as mupirocin ointment to apply daily.  Wash the legs with soap and water daily. -Monitor for any clinical signs or symptoms of infection and directed to call the office immediately should any occur or go to the ER. -Patient encouraged to call the office with any questions, concerns, change in symptoms.   Trula Slade DPM

## 2019-11-18 ENCOUNTER — Other Ambulatory Visit: Payer: Self-pay

## 2019-11-18 ENCOUNTER — Ambulatory Visit
Admission: RE | Admit: 2019-11-18 | Discharge: 2019-11-18 | Disposition: A | Discharge status: Home / Self Care | Payer: Medicare HMO | Source: Ambulatory Visit | Attending: Neurology | Admitting: Neurology

## 2019-11-18 DIAGNOSIS — F0390 Unspecified dementia without behavioral disturbance: Secondary | ICD-10-CM

## 2019-11-18 LAB — WOUND CULTURE
MICRO NUMBER:: 10715257
SPECIMEN QUALITY:: ADEQUATE

## 2019-11-19 ENCOUNTER — Encounter: Payer: Medicare HMO | Admitting: Counselor

## 2019-11-20 ENCOUNTER — Telehealth: Payer: Self-pay | Admitting: *Deleted

## 2019-11-20 NOTE — Telephone Encounter (Signed)
-----   Message from Trula Slade, DPM sent at 11/18/2019  5:54 PM EDT ----- Lattie Haw- please let him know that the culture did show a staph infection. Finish course of doxycycline.

## 2019-11-20 NOTE — Telephone Encounter (Signed)
Called and spoke with the patient this morning and relayed the message per Dr Jacqualyn Posey. Lattie Haw

## 2019-11-24 ENCOUNTER — Ambulatory Visit: Payer: Medicare HMO | Admitting: Podiatry

## 2019-11-26 ENCOUNTER — Encounter: Payer: Medicare HMO | Admitting: Counselor

## 2019-12-11 ENCOUNTER — Ambulatory Visit: Payer: Medicare HMO | Admitting: Podiatry

## 2019-12-11 ENCOUNTER — Other Ambulatory Visit: Payer: Self-pay

## 2019-12-11 DIAGNOSIS — E1149 Type 2 diabetes mellitus with other diabetic neurological complication: Secondary | ICD-10-CM

## 2019-12-11 DIAGNOSIS — B351 Tinea unguium: Secondary | ICD-10-CM | POA: Diagnosis not present

## 2019-12-11 DIAGNOSIS — M79674 Pain in right toe(s): Secondary | ICD-10-CM | POA: Diagnosis not present

## 2019-12-11 DIAGNOSIS — M79675 Pain in left toe(s): Secondary | ICD-10-CM

## 2019-12-14 NOTE — Progress Notes (Signed)
Subjective: 69 year old male presents the office today for concerns of thick, elongated toes that he cannot trim himself and are causing discomfort.  I last saw him on July 16 for multiple superficial areas of skin breakdown, infection.  He was on antibiotics and he states this resolved these issues.  He has no other concerns today no open lesions. Denies any systemic complaints such as fevers, chills, nausea, vomiting. No acute changes since last appointment, and no other complaints at this time.   Objective: NAD-wife is present today DP/PT pulses palpable bilaterally, CRT less than 3 seconds No open lesions identified bilaterally and there is no erythema or areas of fluctuation. No blister formation at this time Nails are hypertrophic, dystrophic, brittle, discolored, elongated 10. No surrounding redness or drainage. Tenderness nails 1-5 bilaterally. No open lesions or pre-ulcerative lesions are identified today. No pain with calf compression, swelling, warmth, erythema  Assessment: Symptomatic onychomycosis  Plan: -All treatment options discussed with the patient including all alternatives, risks, complications.  -Nails sharply debrided x10 without any complications or bleeding -At this time no evidence of posterior infection.  Monitoring recurrence.  Discussed daily foot inspection. -Patient encouraged to call the office with any questions, concerns, change in symptoms.   Trula Slade DPM

## 2019-12-25 ENCOUNTER — Other Ambulatory Visit: Payer: Self-pay

## 2019-12-25 ENCOUNTER — Ambulatory Visit: Payer: Medicare HMO | Admitting: Podiatry

## 2019-12-25 DIAGNOSIS — S90821A Blister (nonthermal), right foot, initial encounter: Secondary | ICD-10-CM

## 2019-12-25 DIAGNOSIS — E1149 Type 2 diabetes mellitus with other diabetic neurological complication: Secondary | ICD-10-CM | POA: Diagnosis not present

## 2019-12-25 MED ORDER — DOXYCYCLINE HYCLATE 100 MG PO TABS
100.0000 mg | ORAL_TABLET | Freq: Two times a day (BID) | ORAL | 0 refills | Status: DC
Start: 2019-12-25 — End: 2020-05-26

## 2019-12-28 NOTE — Progress Notes (Signed)
Subjective: 69 year old male presents the office with his wife for concerns of a new blister on the top of the right foot.  Possibly coming from where his shoes are rubbing.  Denies any drainage or pus or any other open sores or blisters. Denies any systemic complaints such as fevers, chills, nausea, vomiting. No acute changes since last appointment, and no other complaints at this time.   Objective: AAO x3, NAD DP/PT pulses palpable bilaterally, CRT less than 3 seconds On the dorsal aspect of the forefoot is a fluid-filled clear blister with faint surrounding erythema there is no ascending cellulitis or drainage or pus otherwise.  No open lesions. No pain with calf compression, swelling, warmth, erythema  Assessment: Blister right foot  Plan: -All treatment options discussed with the patient including all alternatives, risks, complications.  -Cleaned the skin and then to drain the blister to reveal clear fluid but there is no purulence.  Prescribe doxycycline recommended daily dressing changes which we discussed. -Monitor for any clinical signs or symptoms of infection and directed to call the office immediately should any occur or go to the ER. -Patient encouraged to call the office with any questions, concerns, change in symptoms.   Trula Slade DPM

## 2019-12-30 ENCOUNTER — Telehealth: Payer: Self-pay | Admitting: Podiatry

## 2019-12-30 NOTE — Telephone Encounter (Signed)
Dean Cole patient. Last visit 12/25/2019 Wife states blister has opened up and peeled back. It's "blood red" underneath. Would you like her to continue dressing changes or have Dean Cole come in sooner for evaluation?

## 2019-12-30 NOTE — Telephone Encounter (Signed)
Lattie Haw can you please call him to see how he is doing? I had drained the blister but left the skin intact over the wound. I think that the skin must have come off. I would clean with soap and water and apply mupirocin ointment twice a day. If there is any extending redness/streaking to let me know. Finish oral antibiotics as well.

## 2019-12-31 NOTE — Telephone Encounter (Signed)
Called and spoke with the wife and the wife stated that the patient got his vaccine the day before and got chills and the blister skin did come off per the patient's wife and was red around the area and per Dr Jacqualyn Posey just wash with soap and water and if any streaking or redness going up leg go to the ER and call the office if any concerns or questions. Lattie Haw

## 2019-12-31 NOTE — Telephone Encounter (Signed)
error 

## 2020-01-07 ENCOUNTER — Encounter: Payer: Medicare HMO | Admitting: Counselor

## 2020-01-12 ENCOUNTER — Other Ambulatory Visit: Payer: Self-pay

## 2020-01-12 ENCOUNTER — Ambulatory Visit: Payer: Medicare HMO | Admitting: Podiatry

## 2020-01-12 DIAGNOSIS — S90821A Blister (nonthermal), right foot, initial encounter: Secondary | ICD-10-CM

## 2020-01-12 DIAGNOSIS — E1149 Type 2 diabetes mellitus with other diabetic neurological complication: Secondary | ICD-10-CM

## 2020-01-14 ENCOUNTER — Encounter: Payer: Medicare HMO | Admitting: Counselor

## 2020-01-15 DIAGNOSIS — K529 Noninfective gastroenteritis and colitis, unspecified: Secondary | ICD-10-CM | POA: Insufficient documentation

## 2020-01-16 NOTE — Progress Notes (Signed)
Subjective: 69 year old male presents the office with his wife for follow-up evaluation of the blister, wound on the top of the right foot.  Wound appears to be resolved.  Some still some faint swelling but no open sores or new blisters identified at this time.  Otherwise has been doing well. Denies any systemic complaints such as fevers, chills, nausea, vomiting. No acute changes since last appointment, and no other complaints at this time.   Objective: NAD-presents with wife DP/PT pulses palpable bilaterally, CRT less than 3 seconds The blister, wound on the dorsal aspect the right foot has resolved.  There is trace edema.  There is no erythema or warmth.  No skin breakdown or blister formation is identified today. No pain with calf compression, swelling, warmth, erythema  Assessment: Resolved blister, wound right foot  Plan: -All treatment options discussed with the patient including all alternatives, risks, complications.  -Patient is concerned that the inserts, shoes are causing blistering.  He will follow-up with Liliane Channel for evaluation of the inserts, shoes. -Discussed daily foot inspection for any current. -Patient encouraged to call the office with any questions, concerns, change in symptoms.   Trula Slade DPM

## 2020-01-29 ENCOUNTER — Other Ambulatory Visit: Payer: Self-pay | Admitting: Neurology

## 2020-01-30 ENCOUNTER — Other Ambulatory Visit: Payer: Self-pay

## 2020-01-30 ENCOUNTER — Encounter: Payer: Self-pay | Admitting: Counselor

## 2020-01-30 ENCOUNTER — Ambulatory Visit (INDEPENDENT_AMBULATORY_CARE_PROVIDER_SITE_OTHER): Payer: Medicare HMO | Admitting: Counselor

## 2020-01-30 ENCOUNTER — Ambulatory Visit: Payer: Medicare HMO

## 2020-01-30 DIAGNOSIS — Z789 Other specified health status: Secondary | ICD-10-CM

## 2020-01-30 DIAGNOSIS — G319 Degenerative disease of nervous system, unspecified: Secondary | ICD-10-CM

## 2020-01-30 DIAGNOSIS — F09 Unspecified mental disorder due to known physiological condition: Secondary | ICD-10-CM

## 2020-01-30 DIAGNOSIS — R4189 Other symptoms and signs involving cognitive functions and awareness: Secondary | ICD-10-CM

## 2020-01-30 DIAGNOSIS — Z8669 Personal history of other diseases of the nervous system and sense organs: Secondary | ICD-10-CM

## 2020-01-30 NOTE — Progress Notes (Signed)
° °  Psychometrist Note   Cognitive testing was administered to Dean Cole by Lamar Benes, B.S. (Technician) under the supervision of Alphonzo Severance, Psy.D., ABN. [name] was able to tolerate all test procedures. Dr. Nicole Kindred met with the patient as needed to manage any emotional reactions to the testing procedures. Rest breaks were offered.    The battery of tests administered was selected by Dr. Nicole Kindred with consideration to the patient's current level of functioning, the nature of his symptoms, emotional and behavioral responses during the interview, level of literacy, observed level of motivation/effort, and the nature of the referral question. This battery was communicated to the psychometrist. Communication between Dr. Nicole Kindred and the psychometrist was ongoing throughout the evaluation and Dr. Nicole Kindred was immediately accessible at all times. Dr. Nicole Kindred provided supervision to the technician on the date of this service, to the extent necessary to assure the quality of all services provided.    [name] will return in approximately one week for an interactive feedback session with Dr. Nicole Kindred, at which time test performance, clinical impressions, and treatment recommendations will be reviewed in detail. The patient understands he can contact our office should he require our assistance before this time.   A total of 100 minutes of billable time were spent with Dean Cole by the technician, including test administration and scoring time. Billing for these services is reflected in Dr. Les Pou note.   This note reflects time spent with the psychometrician and does not include test scores, clinical history, or any interpretations made by Dr. Nicole Kindred. The full report will follow in a separate note.

## 2020-01-30 NOTE — Progress Notes (Signed)
[facility]  Patient Name: Dean Cole MRN: [id number] Date of Birth: [birth year] Age: 69 y.o. Education: 12 years  Referral Circumstances and Background Information  Dean Cole is a 69 y.o., right-hand dominant, married man with a history of gait disturbance and memory problems over the past 2 years. He has a past medical history of HTN, T2DM, hypothyroidism, PAD, CAD, basal cell carcinoma and prostate cancer. He was in the hospital for metabolic encephalopathy in March, 2021 and was found to have sepsis of unknown origin (possible enteritis vs. Collitis) with slightly elevated ammonia. His mental status returned to baseline at the time of discharge but he returned to the ED several months later on 10/04/2019 with a fever of 100.6 complaining of increased confusion and urinary frequency. He was seen following that presentation by Dr. 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 Dr. 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 Donepezil was started. He was referred for neuropsychological evaluation for clarification of current cognitive status.   On interview, the patient reported that he does have problems with memory and thinking "sometimes," but he doesn't think it's a big deal. The patient's wife Dean Cole) had a hard time summarizing his symptoms but said that she noticed changes about two years ago after his "5 treatments for cancer." It became apparent, after interviewing her, that it is very difficult to disentangle his cognitive symptoms from his general physical status, which has been in decline the past 8 years with acceleration over the past 2 years. He is often tired, sleeps much throughout the day, and has abandoned largely all of his activities related to  fatigue, weakness, and other issues in addition to any cognitive problems. She wasn't able to spontaneously provide many cognitive symptoms. On detailed review of symptoms, she reported that she started noticing memory loss over about the past year, more forgetting things than repeating himself. She noted that his symptoms fluctuate a fair amount but thinks overall they are worsening over time. He sometimes gets confused about the days and at times loses the year. She doesn't notice any problems with language, such as forming words or understanding others. She denied any visual hallucinations or delusions. Sometimes he hears bells but he has hearing loss and has no auditory hallucinations. She has appreciated some behavior changes, he can get angry very quickly and that is not like him. There are nevertheless no safety problems, it is just verbal aggression. They haven't noticed any odd or inappropriate behaviors socially, he has no collecting behaviors that are new (he has always collected things), he has no compulsive or repetitive behaviors, he has no verbal stereotypies, he has significant apathy and has given up many things over the past year although that may also be because he is medically ill. He stated that his doctors have instructed him to elevate his feet related to blisters and recurrent staff infections, and he tries to follow their advice.   The patient has some movement problems, he has been falling some over the past year, although not frequently, he has had "3 good falls" over the past year. His wife feels that he shuffles when he walks, which she thinks is getting worse over the past 6 months. She hasn't noticed any tremors. She does feel like he moves very slowly over the  past 6 months.   With respect to functioning, the patient hasn't worked in about 17 years. He was raised by his grandmother and lived with her until he moved out. It sounds like he has always been fairly dependent on others  and has essentially never used a cell phone, computer, or been into cognitively demanding hobbies such as reading. He used to work on cars but stopped doing that after bypasses, 8 years ago. His wife stated that he never has paid the bills. He hasn't ridden his motorocycle in "a long time" because his balance is off. She reported that he does still drive and I reminded them he is not supposed to as per Dr. Tomi Likens and I concur with his recommendation. He used to be involved in gardening and that stopped last year, they didn't plant one this year. He has really never done his medications entirely on his own, but he does organize his evening medications and is reliable about doing so. He is independent with respect to toileting and dressing. She has had to prompt him to engage in hygiene practices over the past 6 months. He will help around the house to some extent if prompted, he can still wash dishes. His wife stated that he stopped doing things spontaneously around the house several years ago. They reported that if he so much as goes outside for 15-30 minutes, he seems to feel very tired the next day and sleeps much of the day.   Past Medical History and Review of Relevant Studies   Patient Active Problem List   Diagnosis Date Noted  . Acute metabolic encephalopathy 77/93/9030  . Urinary urgency 10/05/2019  . Hyperglycemia due to diabetes mellitus ([place]) 10/05/2019  . Educated about COVID-19 virus infection 08/05/2019  . Acute kidney failure ([place]) 07/07/2019  . AMS (altered mental status) 07/07/2019  . Postural dizziness 07/07/2019  . Hypocalcemia 07/07/2019  . Sepsis ([place]) 07/07/2019  . Colitis 07/07/2019  . N&V (nausea and vomiting) 07/06/2019  . Lung nodule 07/06/2019  . Fever 07/06/2019  . Malignant neoplasm of prostate ([place]) 07/23/2018  . Cellulitis of toe 05/23/2018  . Acquired deformity of toenail 05/15/2018  . Foot callus 04/10/2018  . Long toenail 04/10/2018  . Encounter for Medicare  annual wellness exam 12/19/2017  . Ureteral stone with hydronephrosis 08/04/2017  . Anxiety, generalized 03/19/2017  . Essential hypertension 03/19/2017  . Moderate episode of recurrent major depressive disorder ([place]) 03/19/2017  . Hearing loss 04/19/2016  . PAD (peripheral artery disease) ([place]) 09/20/2014  . Controlled type 2 DM with peripheral circulatory disorder ([place]) 09/20/2014  . Depression 09/16/2014  . Erectile dysfunction 07/02/2014  . Type II diabetes mellitus with renal manifestations ([place]) 03/04/2014  . Eczema 12/25/2012  . Anxiety   . Dyslipidemia 02/29/2012  . Hypertensive heart disease without CHF   . Hypothyroidism   . Coronary artery disease   . Basal cell carcinoma   . Headache   . Restless leg syndrome   . Melanoma of skin ([place]) 06/14/2001   Review of Neuroimaging and Relevant Medical History: The patient has an MRI of the brain from July 20th, which was personally reviewed and shows mild to moderate atrophy, a bit more than expected in a 70 year old patient, with slight frontal predominance. There is some minimal leukoaraiosis, perhaps enough to contribute to but not enough to cause a dementia level of function, in my opinion.   The patient has a littany of health conditions that started about 9 year with CABG x6  related to CAD, he had been home about 3 months and needed a gall bladder surgery and was in the hospital about 9 days, he then had a parasitic infection of some sort (patient wasn't aware what type) and was in the hospital, he has also had recurrent staph infections several times. His wife generally reported that he seems to get more confused and sleeps much of the day when he has health issues. He also has no appetite and has lost about 33lbs over the past year.   Current Outpatient Medications  Medication Sig Dispense Refill  . aspirin EC 325 MG tablet Take 325 mg by mouth every morning.    . Cholecalciferol (VITAMIN D3 PO) Take 2 tablets by mouth daily.     . clotrimazole-betamethasone (LOTRISONE) cream Apply 1 application topically 2 (two) times daily as needed (for skin irritation). 45 g 1  . diazepam (VALIUM) 10 MG tablet Take 10 mg by mouth 2 (two) times daily.    Marland Kitchen donepezil (ARICEPT) 10 MG tablet TAKE 1 TABLET BY MOUTH EVERYDAY AT BEDTIME 90 tablet 1  . doxycycline (VIBRA-TABS) 100 MG tablet Take 1 tablet (100 mg total) by mouth 2 (two) times daily. 20 tablet 0  . doxycycline (VIBRA-TABS) 100 MG tablet Take 1 tablet (100 mg total) by mouth 2 (two) times daily. 28 tablet 0  . gabapentin (NEURONTIN) 100 MG capsule TAKE 1 CAPSULE BY MOUTH THREE TIMES A DAY (Patient taking differently: Take 100 mg by mouth 3 (three) times daily. ) 90 capsule 3  . imipramine (TOFRANIL) 25 MG tablet Take 100 mg by mouth at bedtime.     Marland Kitchen levothyroxine (SYNTHROID) 175 MCG tablet Take 175 mcg by mouth daily before breakfast.     . magnesium oxide (MAG-OX) 400 (241.3 Mg) MG tablet Take 1 tablet (400 mg total) by mouth daily. (Patient taking differently: Take 400 mg by mouth at bedtime. ) 30 tablet 0  . metFORMIN (GLUCOPHAGE) 500 MG tablet Take 1 tablet (500 mg total) by mouth 2 (two) times daily with a meal. 60 tablet 2  . metoprolol tartrate (LOPRESSOR) 25 MG tablet Take 25 mg by mouth 2 (two) times daily.    . mupirocin ointment (BACTROBAN) 2 % Apply 1 application topically 2 (two) times daily. (Patient taking differently: Apply 1 application topically 2 (two) times daily as needed (to affected area(s) on head). ) 30 g 2  . mupirocin ointment (BACTROBAN) 2 % Apply 1 application topically 2 (two) times daily. 30 g 2  . MYRBETRIQ 25 MG TB24 tablet TAKE 1 TABLET BY MOUTH EVERY DAY (Patient taking differently: Take 25 mg by mouth daily. ) 30 tablet 2  . NITROSTAT 0.4 MG SL tablet Place 0.4 mg under the tongue every 5 (five) minutes as needed for chest pain. Reported on 10/26/2015    . Nutritional Supplements (ENSURE PO) Take 1 Bottle by mouth in the morning, at noon, and at  bedtime.    . pentazocine-naloxone (TALWIN NX) 50-0.5 MG tablet Take 1 tablet by mouth in the morning and at bedtime.     . simvastatin (ZOCOR) 20 MG tablet TAKE ONE TABLET BY MOUTH ONCE DAILY FOR CHOLESTEROL (Patient taking differently: Take 20 mg by mouth daily. Take one tablet by mouth once daily for cholesterol) 90 tablet 1  . tamsulosin (FLOMAX) 0.4 MG CAPS capsule Take 0.4 mg by mouth 2 (two) times daily.     No current facility-administered medications for this visit.   Family History  Problem Relation Age  of Onset  . Coronary artery disease Father   . Prostate cancer Father   . Diabetes Father   . Leukemia Mother   . Suicidality Brother   . Drug abuse Brother   . Coronary artery disease Paternal Grandfather   . Breast cancer Neg Hx   . Colon cancer Neg Hx   . Pancreatic cancer Neg Hx    There is no  family history of dementia. There is a family history of psychiatric illness. His brother killed himself (it was drug related).   Psychosocial History  Developmental, Educational and Employment History: The patient was raised by his grandmother, he was given to her at 2. They stated that there was some alcohol abuse and marital infidelity. He walked and talked on time. They denied that there was alcohol abuse when his mother was pregnant with him. He stated that he did "real bad" in school. He didn't care and didn't focus on academics. He took vocational classes to the extent that he could, such as Medical laboratory scientific officer. He can read, but he has never been good at math or academics in general. 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   Psychiatric History: Any issues with depression, anxiety, or psychiatric treatment were denied.   Substance Use History: The patient used to drink heavily but quit a long time ago. He denied any  drug use. He quit smoking about 30 years ago. He smokes a pipe sometimes.   Relationship History and Living Cimcumstances: The patient and his wife have been married for nearly 28 years. They have a complex family situation. They are currently raising 4 great-grandchildren and an adopted child with special needs. They are all special needs. One has 18p deletion, one is "bipolar schizophrenic", the other is "6 and mentally three and a half", one has autism, they are "all on a lot of medication."   Mental Status and Behavioral Observations  Sensorium/Arousal: The patient's level of arousal was awake and alert. Hearing and vision were not adequate for testing purposes. The patient had a very hard time hearing the examiner during testing and during the interview even with loud volume. This likely significantly reduced his scores on mental status testing, for instance he missed mainly verbal items on the MMSE. As per technician, he did not wear his reading glasses and did not inform her he needed them until halfway through the evaluation. At that point, glasses were provided to him. Orientation: The patient was oriented, to person, place, and to some extent time but was off on the month (reported it was September) and the day (reported it was Monday).  Appearance: Dressed in casual bedtime clothing, PJs, adequate grooming and hygiene.  Behavior: The patient presented as fairly impaired but also had a hard time hearing  Speech/language: Speech was normal in rate, rhythm, volume, and prosody Gait/Posture: Gait was observed with somewhat short steps, no frank shuffling but didn't life the feet, and he had a tendency to lean forward Movement: Postural/action tremor noted in the bilateral UE, fine, intermittent Social Comportment: Appropriate, although the patient had a tendency to talk over the examiner.  Mood: "Fine" Affect: Mainly neutral Thought process/content: Thought process was circumstantial but not  tangential. He would often go into stories or minute details of things and had a hard  time with some aspects of his personal history, such as when he retired.  Safety: No safety concerns identified at this visit Insight: Unclear  MMSE - Mini Mental State Exam 01/30/2020 04/19/2016 11/11/2013  Orientation to time 3 5 4   Orientation to Place 5 5 5   Registration 1 3 3   Attention/ Calculation 4 4 4   Recall 1 2 2   Language- name 2 objects 2 2 2   Language- repeat 0 1 1  Language- follow 3 step command 2 3 3   Language- read & follow direction 1 1 1   Write a sentence 1 1 1   Copy design 0 0 0  Total score 20 27 26    Patient lost at least 6 points due to hearing issues.   Test Procedures  Wide Range Achievement Test - 4   Word Reading Repeatable Battery for the Assessment of Neuropsychological Status (Form A) ACS Word Choice The Dot Counting Test Semantic Fluency (Animals) Trail Making Test A & B Wisconsin Card Sorting Test - 64 Geriatric Depression Scale - Short Form Quick Dementia Rating System (completed by wife, Dean Cole)  Plan  Dean Cole was seen for a psychiatric diagnostic evaluation and neuropsychological testing. He is a pleasant, 69 year old, right-hand dominant man with a complex medical history over the past 8 years including CABG x6, cholecystectomy shortly thereafer with proloned hospital stay, multiple staff infections, prostate cancer, basal cell carcinoma, and more recently episodes of altered mental status and gait changes with over 30lbs of weight loss over the past year. Noted to have multiple metabolic abnormalities at his last admission including hypokalemia, elevated ammonia, and he has had recurrent infections. He and his wife had a hard time differentiating his cognitive symptoms from his physical health symptoms but it sounds as though both his mental and physical status have been declining over the past two years or so. He is also quite deaf, which rendered  testing challenging, and has a history of learning difficulties having focused mostly on vocational training during school. He has never worked, Secondary school teacher, used a Teaching laboratory technician, or a cell phone, making cognitive decline challenging to ascertain behaviorally. Full and complete note with impressions, recommendations, and interpretation of test data to follow.   Viviano Simas Nicole Kindred, PsyD, [place] Clinical Neuropsychologist  Informed Consent and Coding/Compliance  Risks and benefits of the evaluation were discussed with the patient prior to all testing procedures. I conducted a clinical interview and neuropsychological testing (at least two tests) with Dean Cole and Dean Cole, B.S. (Technician) administered additional test procedures. The patient was able to tolerate the testing procedures and the patient (and/or family if applicable) is likely to benefit from further follow up to receive the diagnosis and treatment recommendations, which will be rendered at the next encounter. Billing below reflects technician time, my direct face-to-face time with the patient, time spent in test administration, and time spent in professional activities including but not limited to: neuropsychological test interpretation, integration of neuropsychological test data with clinical history, report preparation, treatment planning, care coordination, and review of diagnostically pertinent medical history or studies.   Services associated with this encounter: Clinical Interview [phone]) plus 60 minutes (88325; Neuropsychological Evaluation by Professional)  130 minutes (49826; Neuropsychological Evaluation by Professional, Adl.) 18 minutes (41583; Test Administration by Professional) 30 minutes (09407; Neuropsychological Testing by Technician) 70 minutes ([id number]; Neuropsychological Testing by Technician, Adl.)

## 2020-02-02 NOTE — Progress Notes (Signed)
[facility]  Patient Name: BERK PILOT MRN: [id number] Date of Birth: 11/05/1950 Age: 69 y.o. Education: 12 years  Measurement properties of test scores: IQ, Index, and Standard Scores (SS): Mean = 100; Standard Deviation = 15 Scaled Scores (Ss): Mean = 10; Standard Deviation = 3 Z scores (Z): Mean = 0; Standard Deviation = 1 T scores (T); Mean = 50; Standard Deviation = 10  TEST SCORES:    Note: This summary of test scores accompanies the interpretive report and should not be interpreted by unqualified individuals or in isolation without reference to the report. Test scores are relative to age, gender, and educational history as available and appropriate.   Performance Validity        ACS: Raw Descriptor      Word Choice: 45 Within Expectation      The Dot Counting Test: Raw Descriptor      E-Score 14 Within Expectation      Embedded Measures: Raw Descriptor      RBANS Effort Index: 4 Below Expectation  Expected Functioning        Wide Range Achievement Test (Word Reading): Standard/Scaled Score Percentile       Word Reading 78 7      Cognitive Testing        RBANS, Form : Standard/Scaled Score Percentile  Total Score 54 <1  Immediate Memory 53 <1      List Learning 2 <1      Story Memory 3 1  Visuospatial/Constructional 69 2      Figure Copy   (17) 8 25      Judgment of Line Orientation   (6) --- <2  Language 85 16      Picture Naming --- 51-75      Semantic Fluency 4 2  Attention 64 1      Digit Span 7 16      Coding 2 <1  Delayed Memory 48 <1      List Recall   (0) --- <2      List Recognition   (11) --- <2      Story Recall   (3) 3 1      Figure Recall   (8) 6 9      Neuropsychological Assessment Battery (Language Module): T-score Percentile      Naming   (30) 56 73      Verbal Fluency: T-score Percentile      Semantic Fluency (Animals) 45 31      Trail Making Test: T-Score Percentile      Part A 31 3       Part B 17 <1      Modified Wisconsin Card Sorting Test (MWCST): Standard/T-Score Percentile      Number of Categories Correct 19 <1      Number of Perseverative Errors 49 46      Number of Total Errors 29 2      Percent Perseverative Errors 59 82  Executive Function Composite 74 4      Clock Drawing Raw Score Descriptor      Command 6 Mild Impairment      Rating Scales        Quick Dementia Rating System Raw Score Descriptor      Sum of Boxes 2.5 Very Mild Dementia      Total Score 3.5 MCI  Geriatric Depression Scale - Short Form 3 Negative   Jermisha Hoffart V. Nicole Kindred PsyD, [place] Clinical Neuropsychologist

## 2020-02-09 ENCOUNTER — Ambulatory Visit (INDEPENDENT_AMBULATORY_CARE_PROVIDER_SITE_OTHER): Payer: Medicare HMO | Admitting: Counselor

## 2020-02-09 ENCOUNTER — Encounter: Payer: Self-pay | Admitting: Counselor

## 2020-02-09 ENCOUNTER — Other Ambulatory Visit: Payer: Self-pay

## 2020-02-09 DIAGNOSIS — R4189 Other symptoms and signs involving cognitive functions and awareness: Secondary | ICD-10-CM

## 2020-02-09 DIAGNOSIS — R4182 Altered mental status, unspecified: Secondary | ICD-10-CM

## 2020-02-09 DIAGNOSIS — F09 Unspecified mental disorder due to known physiological condition: Secondary | ICD-10-CM

## 2020-02-09 NOTE — Progress Notes (Signed)
[facility]  Patient Name: Dean Cole MRN: [id number] Date of Birth: May 15, 1950 Age: 69 y.o. Education: 12 years  Clinical Impressions  Dean Cole is a 69 y.o., right-hand dominant, married man with a history of multiple chronic health conditions and recent admissions on 07/06/2019 for sepsis of unknown origin and 10/04/2019 for encephalopathy. On interview, he and his wife reported that he has had cognitive changes over the past 2 years approximately, a period also associated with declining physical health. Establishing a clear history of functional decline due to cognitive loss alone was challenging, because he has given up virtually all activities that he used to do related to physical health problems. He never had many instrumental activities of daily living as his wife has always done the finances. He has never used a computer or a cell phone. He does have an MRI that shows volume loss, a bit more than expected ina  69 year old patient, with slight frontal predominance.   Neuropsychological test findings must be interpreted cautiously given Dean Cole' significant uncorrected hearing difficulties and low single word reading, tempering expectations for test performance. There was, nevertheless, suggestion of fairly significant cognitive problems, presenting with notable memory encoding problems, slow processing speed, and low scores on most executive tests. He may have memory storage problems although that is difficult to disentangle from his encoding and hearing issues and he did retain some visual information across time. Performance on basic digit repetition forward and on copy of a complex figure (a visuospatial task) presented as areas of relative strength. He had a low score on another visuospatial task but it's dubious as to whether he understood the instructions. Interestingly, he did well on measures of naming and semantic verbal fluency,  which are viewed as most specific for Alzheimer's clinical syndrome. He screened negative for the presence of depression and was characterized as functioning more at an MCI than a dementia level by his wife when she was instructed to rate his level of difficulty on the basis of cognitive loss alone.  Dean Cole is thus demonstrating some cognitive dysfunction that may reach a major neurocognitive disorder level of severity, although it is not entirely clear given his functional diminishment related to general poor health and recurrent episodes of altered mental status. Neurodegeneration is certainly possible but his test data are not clearly convincing for AD. 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 He is on multiple potentially impairing medications (particularly diazepam), which may also be contributing.   Diagnostic Impressions: Multifactorial cognitive dysfunction  Recommendations to be discussed with patient  Your performance and presentation on assessment were consistent with low levels of cognitive performance in several areas, including on measures of memory encoding (I.e., getting information in), retaining information across time, executive function (e.g., switching between response sets, regulating attention, problem solving). Interestingly, you did well on measures that are the most specific for the type of cognitive changes we typically expect to see in the setting of Alzheimer's disease, the most common neurodegenerative cause of cognitive impairment. It is challenging, in your case, to determine the exact level of your cognitive difficulties because much of your day-to-day impairment is on the basis of poor health status and corresponding physical limitations. So, I do think that you have a cognitive disorder but I am not certain that it reaches a dementia level of function.   The main concern in an individual with  cognitive dysfunction  over the age of 69 is that the condition is caused by a brain disease, such as Alzheimer's. When this is the case, there is progression over time. In your case, there are other potential explanations although I certainly can't rule out the possibility of Alzheimer's. You are chronically ill with a number of different conditions and have had metabolic abnormalities resulting in episodes of altered mental status, which could certainly be throwing you off. The brain is connected to the body and many individuals with persistent chronic disease experience cognitive problems due to this relationship. It is possible that there is also a neurodegenerative condition in your case, but it is not entirely clear that this is the case.   As for your cognitive problems, I think that getting better control of your underlying health conditions is likely to be the best strategy. Work with your medical providers to make sure that you are managing your diabetes, high blood pressure, avoiding infections, and otherwise caring for your health status. Basic measures such as making sure that you are eating 3 nutritious meals a day, getting sufficient sleep, and getting some physical activity to the extent possible are encouraged.   You should know that several of your medications (I.e., diazepam/valium, gabapentin, and imipramine) can cause cognitive problems.This is particularly true in individuals who are medically ill, elderly, and may have decreased clearance and tolerance. These medications are prescribed for a reason, however, and thus you should continue to take them as prescribed unless directed otherwise by a prescribing provider.   The following compensatory strategies may be helpful for managing day-to-day memory symptoms: . Minimize distractions and interruptions to the extent possible. Be an active observer, present-minded, and focus attention. Focus on only one task for a period of time.  . Get  organized. Establish routines and stick to them. Make and use checklists.  . Use external memory aids as needed, such as a planner and notebook. Repetition, written reminders, and keeping a calendar of appointments may be helpful. Marlene Lard a place to keep your keys, wallet, cell phone, and other personal belongings.  . Break down tasks into smaller steps to help get started and to keep from feeling overwhelmed.  . Increase your success learning information by breaking it into manageable chunks, connecting it to previously learned information, or forming associations with what you are trying to remember.    Test Findings  Test scores are summarized in additional documentation associated with this encounter. Test scores are relative to age, gender, and educational history as available and appropriate. There were some concerns about test validity given Mr. Sees' significant uncorrected hearing loss. Nevertheless, he outwardly appeared to be putting forth a good effort and scored within normal limits on most performance validity indicators and thus, findings are deemed interpretable with appropriate caution.   General Intellectual Functioning/Achievement:  Performance on single word reading fell at an unusually low level, tempering expectations for cognitive test performance.   Attention and Processing Efficiency: Performance on indicators of attention and processing efficiency was extremely low, although this was mainly due to a low score on timed number-symbol coding, reflecting diminished processing speed. His performance on digit repetition forward was better and fell at a low average level.   Language: Performance on measures of language represented a strength for Mr. Wolfley despite some low scores. He performed within normal limits on measures of visual object confrontation naming. Generation of words in response to the category prompt "animals" was average whereas he had a harder time  with  fruits and vegetables. Given specific task demands of the latter indicator that may partially reflect processing speed task demands.   Visuospatial Function: Performance on measures of visuospatial and constructional functioning was mixed with a score on the overall index at the margin of the unusually low and extremely low ranges. Copy of a line drawing was average, with a tremulous quality to the lines and some sloppiness but overall preserved gestalt and accurate features. He performed at an extremely low level on judgment of angular line orientations but as per technician, it's not clear he understood that task.   Learning and Memory: Measures of memory and learning reflected very poor initial encoding of verbal information and diminished delayed free recall with no benefit to recognition cuing. Nevertheless, he did retain some visual information over time and thus may have some residual memory storage capacity. The findings are not clearly convincing for a storage problem given the numerous confounds (e.g., very poor encoding, premorbid limitations, and deafness) though the possibility cannot be entirely ruled out.   In the verbal realm, Mr. Dusza learned 3, 4, 4, and 3 words of a 10-item word list across three learning trials, which is a flat learning curve and extremely low performance. His memory for a brief short story was not much better and presented at an extremely low level. Following a standard delay, he remember a few of the short story deals but still achieved an extremely low score. He did not recall and of the words from the list and recognition memory was near chance levels, generating extremely low scores.   In the visual realm, delayed recall for a modestly complex figure stimulus was unusually low.   Executive Functions: Performance was low on most measures within this domain, including alternating sequencing of numbers and letters and the Executive Function Composite from a  card-sorting measure involving concept formation and flexibility in response to ongoing feedback, both of which were unusually low. Clock drawing was consistent with "mild impairment' with stimulus bound placement of the clock hands.   Rating Scale(s): Mr. Prieto screened negative for the presence of depression. His wife characterized him as functioning at an MCI level of function.   Viviano Simas Nicole Kindred PsyD, [place] Clinical Neuropsychologist

## 2020-02-09 NOTE — Progress Notes (Signed)
NEUROPSYCHOLOGY TELEMEDICINE NOTE [facility]  Telemedicine statement:  I discussed the limitations of neuropsychological care via telemedicine and the availability of in person appointments. The patient expressed understanding and agreed to proceed. The patient was verified with two identifiers.  The visit modality was: telephonic The patient location was: home The provider location was: office  The following individuals participated: Dean Cole  Feedback Note: I met with Dean Cole to review the findings resulting from his neuropsychological evaluation. Since the last appointment, he has been about the same. Time was spent reviewing the impressions and recommendations that are detailed in the evaluation report. We discussed impression of some level of cognitive dysfunction, although I am not entirely convinced it is due to neurodegeneration. Medication side effects in the setting of physical illness are a primary concern. I encouraged him to follow up with Dean Cole, as per Dr. Georgie Chard initial instructions. I took time to explain the findings and answer all the patient's questions. I encouraged Dean Cole to contact me should he have any further questions or if further follow up is desired.   Current Medications and Medical History   Current Outpatient Medications  Medication Sig Dispense Refill   aspirin EC 325 MG tablet Take 325 mg by mouth every morning.     Cholecalciferol (VITAMIN D3 PO) Take 2 tablets by mouth daily.     clotrimazole-betamethasone (LOTRISONE) cream Apply 1 application topically 2 (two) times daily as needed (for skin irritation). 45 g 1   diazepam (VALIUM) 10 MG tablet Take 10 mg by mouth 2 (two) times daily.     donepezil (ARICEPT) 10 MG tablet TAKE 1 TABLET BY MOUTH EVERYDAY AT BEDTIME 90 tablet 1   doxycycline (VIBRA-TABS) 100 MG tablet Take 1 tablet (100 mg total) by mouth 2 (two) times daily. 20 tablet 0    doxycycline (VIBRA-TABS) 100 MG tablet Take 1 tablet (100 mg total) by mouth 2 (two) times daily. 28 tablet 0   gabapentin (NEURONTIN) 100 MG capsule TAKE 1 CAPSULE BY MOUTH THREE TIMES A DAY (Patient taking differently: Take 100 mg by mouth 3 (three) times daily. ) 90 capsule 3   imipramine (TOFRANIL) 25 MG tablet Take 100 mg by mouth at bedtime.      levothyroxine (SYNTHROID) 175 MCG tablet Take 175 mcg by mouth daily before breakfast.      magnesium oxide (MAG-OX) 400 (241.3 Mg) MG tablet Take 1 tablet (400 mg total) by mouth daily. (Patient taking differently: Take 400 mg by mouth at bedtime. ) 30 tablet 0   metFORMIN (GLUCOPHAGE) 500 MG tablet Take 1 tablet (500 mg total) by mouth 2 (two) times daily with a meal. 60 tablet 2   metoprolol tartrate (LOPRESSOR) 25 MG tablet Take 25 mg by mouth 2 (two) times daily.     mupirocin ointment (BACTROBAN) 2 % Apply 1 application topically 2 (two) times daily. (Patient taking differently: Apply 1 application topically 2 (two) times daily as needed (to affected area(s) on head). ) 30 g 2   mupirocin ointment (BACTROBAN) 2 % Apply 1 application topically 2 (two) times daily. 30 g 2   MYRBETRIQ 25 MG TB24 tablet TAKE 1 TABLET BY MOUTH EVERY DAY (Patient taking differently: Take 25 mg by mouth daily. ) 30 tablet 2   NITROSTAT 0.4 MG SL tablet Place 0.4 mg under the tongue every 5 (five) minutes as needed for chest pain. Reported on 10/26/2015     Nutritional Supplements (ENSURE PO)  Take 1 Bottle by mouth in the morning, at noon, and at bedtime.     pentazocine-naloxone (TALWIN NX) 50-0.5 MG tablet Take 1 tablet by mouth in the morning and at bedtime.      simvastatin (ZOCOR) 20 MG tablet TAKE ONE TABLET BY MOUTH ONCE DAILY FOR CHOLESTEROL (Patient taking differently: Take 20 mg by mouth daily. Take one tablet by mouth once daily for cholesterol) 90 tablet 1   tamsulosin (FLOMAX) 0.4 MG CAPS capsule Take 0.4 mg by mouth 2 (two) times daily.     No  current facility-administered medications for this visit.    Patient Active Problem List   Diagnosis Date Noted   Acute metabolic encephalopathy 87/56/4332   Urinary urgency 10/05/2019   Hyperglycemia due to diabetes mellitus ([place]) 10/05/2019   Educated about COVID-19 virus infection 08/05/2019   Acute kidney failure ([place]) 07/07/2019   AMS (altered mental status) 07/07/2019   Postural dizziness 07/07/2019   Hypocalcemia 07/07/2019   Sepsis ([place]) 07/07/2019   Colitis 07/07/2019   N&V (nausea and vomiting) 07/06/2019   Lung nodule 07/06/2019   Fever 07/06/2019   Malignant neoplasm of prostate ([place]) 07/23/2018   Cellulitis of toe 05/23/2018   Acquired deformity of toenail 05/15/2018   Foot callus 04/10/2018   Long toenail 04/10/2018   Encounter for Medicare annual wellness exam 12/19/2017   Ureteral stone with hydronephrosis 08/04/2017   Anxiety, generalized 03/19/2017   Essential hypertension 03/19/2017   Moderate episode of recurrent major depressive disorder ([place]) 03/19/2017   Hearing loss 04/19/2016   PAD (peripheral artery disease) ([place]) 09/20/2014   Controlled type 2 DM with peripheral circulatory disorder ([place]) 09/20/2014   Depression 09/16/2014   Erectile dysfunction 07/02/2014   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 Speech was normal in rate, rhythm, volume, and prosody. Self-reported mood was "allright" and affect as assessed by vocal quality was mainly  neutral. Speech was normal in rate, rhythm, and volume, although he did have a slurred quality at times. Thought process was circumstantial, although he was able to follow the thread of conversation with some redirection. Thought content was appropriate to the topics discussed. There were no safety concerns identified at today's encounter, such as thoughts of harming self or others.   Plan  Feedback provided regarding the patient's neuropsychological evaluation. He has mutlifactorial cognitive dsyfunction with a primary concern for medication side effects in the setting of poor health. He has also had recurrent episodes of AMS. The possibility of an underlying condition like AD cannot be ruled out although it's not clear from his presentation that is the primary cause. Dean Cole was encouraged to contact me if any questions arise or if further follow up is desired.   Viviano Simas Nicole Kindred, PsyD, [facility] Clinical Neuropsychologist  Service(s) Provided at This Encounter: 29 minutes [phone]; Conjoint therapy with patient present)

## 2020-03-07 ENCOUNTER — Other Ambulatory Visit: Payer: Self-pay | Admitting: Podiatry

## 2020-03-10 NOTE — Telephone Encounter (Signed)
Please advise 

## 2020-03-15 ENCOUNTER — Other Ambulatory Visit: Payer: Self-pay

## 2020-03-15 ENCOUNTER — Ambulatory Visit: Payer: Medicare HMO | Admitting: Podiatry

## 2020-03-15 DIAGNOSIS — B351 Tinea unguium: Secondary | ICD-10-CM | POA: Diagnosis not present

## 2020-03-15 DIAGNOSIS — M79674 Pain in right toe(s): Secondary | ICD-10-CM

## 2020-03-15 DIAGNOSIS — E1149 Type 2 diabetes mellitus with other diabetic neurological complication: Secondary | ICD-10-CM | POA: Diagnosis not present

## 2020-03-15 DIAGNOSIS — M79675 Pain in left toe(s): Secondary | ICD-10-CM

## 2020-03-22 NOTE — Progress Notes (Signed)
Subjective: 69 y.o. returns the office today for painful, elongated, thickened toenails which he cannot trim himself. Denies any redness or drainage around the nails. No open lesions he reports. Denies any acute changes since last appointment and no new complaints today. Denies any systemic complaints such as fevers, chills, nausea, vomiting.   PCP: [name], MD  Objective: NAD- presents with wife DP/PT pulses palpable, CRT less than 3 seconds Nails hypertrophic, dystrophic, elongated, brittle, discolored 10. There is tenderness overlying the nails 1-5 bilaterally. There is no surrounding erythema or drainage along the nail sites. No open lesions or pre-ulcerative lesions are identified. No other areas of tenderness bilateral lower extremities. No overlying edema, erythema, increased warmth. No pain with calf compression, swelling, warmth, erythema.  Assessment: Patient presents with symptomatic onychomycosis  Plan: -Treatment options including alternatives, risks, complications were discussed -Nails sharply debrided 10 without complication/bleeding. -Discussed daily foot inspection. If there are any changes, to call the office immediately.  -Follow-up in 3 months or sooner if any problems are to arise. In the meantime, encouraged to call the office with any questions, concerns, changes symptoms.  Celesta Gentile, DPM

## 2020-05-25 NOTE — Progress Notes (Signed)
Cardiology Office Note   Date:  05/26/2020   ID:  Dean Cole, DOB March 14, 1951, MRN [id number]  PCP:  [name], MD  Cardiologist:   Mertie Moores, MD   Chief Complaint  Patient presents with  . Coronary Artery Disease      History of Present Illness: Dean Cole is a 70 y.o. male who presents for follow up of CAD.  He was seen previously by Dr. Mare Ferrari and Wynonia Lawman.   In March of last year he was in the hospital with sepsis of unknown source.  I don't see any cardiac issues during that admission.   I did refer him to neurology after the last visit and he was thought to have multifactorial etiologies including med side effects for his memory and balance issues that were notable at the last visit.      Since I last saw him he was in the emergency room.  This was in June.  He had a fall.  I reviewed these records for this visit.  This was not a syncopal episode.  There was a mention of some encephalopathy but it looks like he probably had a urinary infection.  Since then he has had no new cardiovascular issues.  He did see a neurologist for his balance and memory issues and I did review these records but there was no ear etiology other than possible medications.  He seems to be better from the standpoint.  He does not move around as much but he does have some neuropathy and still problems with balance.  However, he is not describing any chest pressure, neck or arm discomfort.  He has had no new shortness of breath, PND or orthopnea.  Of note he has adopted 5 special needs children for abdominal pain lives with him.  Past Medical History:  Diagnosis Date  . Anxiety   . Basal cell carcinoma   . Cholecystitis   . Controlled type 2 DM with peripheral circulatory disorder ([place]) 09/20/2014  . Coronary artery disease   . Depression   . Headache(784.0)   . HTN (hypertension)   . Hx of skin cancer, basal cell   . Hyperlipidemia   . Hypothyroidism   . Kidney stones   .  Melanoma ([place])   . PAD (peripheral artery disease) ([place]) 09/20/2014  . Prostate cancer ([place])   . Restless leg syndrome   . Sepsis [facility])     Past Surgical History:  Procedure Laterality Date  . BASAL CELL CARCINOMA EXCISION  2000   Dr,Arkin   . CARDIAC CATHETERIZATION  07/07/2011  . CHOLECYSTECTOMY  09/14/2011   Procedure: LAPAROSCOPIC CHOLECYSTECTOMY WITH INTRAOPERATIVE CHOLANGIOGRAM;  Surgeon: Gwenyth Ober, MD;  Location: [place];  Service: General;  Laterality: N/A;  . CORONARY ARTERY BYPASS GRAFT  07/10/2011   Procedure: CORONARY ARTERY BYPASS GRAFTING (CABG);  Surgeon: Ivin Poot, MD;  Location: [place];  Service: Open Heart Surgery;  Laterality: N/A;  . EXCISIONAL HEMORRHOIDECTOMY  1997  . HEMORROIDECTOMY  2000  . LEFT HEART CATHETERIZATION WITH CORONARY ANGIOGRAM N/A 07/07/2011   Procedure: LEFT HEART CATHETERIZATION WITH CORONARY ANGIOGRAM;  Surgeon: Burnell Blanks, MD;  Location: [facility] CATH LAB;  Service: Cardiovascular;  Laterality: N/A;     Current Outpatient Medications  Medication Sig Dispense Refill  . aspirin EC 81 MG tablet Take 1 tablet (81 mg total) by mouth daily. Swallow whole. 90 tablet 3  . Cholecalciferol (VITAMIN D3 PO) Take 2 tablets by mouth daily.    [name]  clotrimazole-betamethasone (LOTRISONE) cream Apply 1 application topically 2 (two) times daily as needed (for skin irritation). 45 g 1  . diazepam (VALIUM) 10 MG tablet Take 10 mg by mouth 2 (two) times daily.    [name] donepezil (ARICEPT) 10 MG tablet TAKE 1 TABLET BY MOUTH EVERYDAY AT BEDTIME 90 tablet 1  . gabapentin (NEURONTIN) 100 MG capsule TAKE 1 CAPSULE BY MOUTH THREE TIMES A DAY 90 capsule 3  . imipramine (TOFRANIL) 25 MG tablet Take 100 mg by mouth at bedtime.     [name] levothyroxine (SYNTHROID) 175 MCG tablet Take 175 mcg by mouth daily before breakfast.     . magnesium oxide (MAG-OX) 400 (241.3 Mg) MG tablet Take 1 tablet (400 mg total) by mouth daily. (Patient taking differently: Take 400 mg by mouth at  bedtime.) 30 tablet 0  . metFORMIN (GLUCOPHAGE) 500 MG tablet Take 1 tablet (500 mg total) by mouth 2 (two) times daily with a meal. 60 tablet 2  . metoprolol tartrate (LOPRESSOR) 25 MG tablet Take 25 mg by mouth 2 (two) times daily.    [name] NITROSTAT 0.4 MG SL tablet Place 0.4 mg under the tongue every 5 (five) minutes as needed for chest pain. Reported on 10/26/2015    . pentazocine-naloxone (TALWIN NX) 50-0.5 MG tablet Take 1 tablet by mouth in the morning and at bedtime.    . simvastatin (ZOCOR) 20 MG tablet TAKE ONE TABLET BY MOUTH ONCE DAILY FOR CHOLESTEROL (Patient taking differently: Take 20 mg by mouth daily. Take one tablet by mouth once daily for cholesterol) 90 tablet 1  . tamsulosin (FLOMAX) 0.4 MG CAPS capsule Take 0.4 mg by mouth 2 (two) times daily.    . mupirocin ointment (BACTROBAN) 2 % Apply 1 application topically 2 (two) times daily. 30 g 2  . MYRBETRIQ 25 MG TB24 tablet TAKE 1 TABLET BY MOUTH EVERY DAY (Patient taking differently: Take 25 mg by mouth daily. ) 30 tablet 2   No current facility-administered medications for this visit.    Allergies:   Eggs or egg-derived products and Adhesive [tape]   ROS:  Please see the history of present illness.   Otherwise, review of systems are positive for none.   All other systems are reviewed and negative.    PHYSICAL EXAM: VS:  BP 108/70   Pulse 76   Ht 5\' 11"  (1.803 m)   Wt 174 lb (78.9 kg)   BMI 24.27 kg/m  , BMI Body mass index is 24.27 kg/m. GENERAL:  Well appearing NECK:  No jugular venous distention, waveform within normal limits, carotid upstroke brisk and symmetric, no bruits, no thyromegaly LUNGS:  Clear to auscultation bilaterally CHEST:  Unremarkable HEART:  PMI not displaced or sustained,S1 and S2 within normal limits, no S3, no S4, no clicks, no rubs, no murmurs ABD:  Flat, positive bowel sounds normal in frequency in pitch, no bruits, no rebound, no guarding, no midline pulsatile mass, no hepatomegaly, no  splenomegaly EXT:  2 plus pulses throughout, no edema, no cyanosis no clubbing   EKG:  EKG is  ordered today. The ekg ordered 07/06/2019 demonstrates sinus tachycardia, rate 112, nonspecific diffuse T wave changes.   Recent Labs: 07/08/2019: Magnesium 1.0 10/05/2019: ALT 14 10/22/2019: BUN 9; Creatinine, Ser 0.85; Potassium 3.1; Sodium 139; TSH 0.31 10/27/2019: Hemoglobin 13.0; Platelets 123    Lipid Panel    Component Value Date/Time   CHOL 166 04/17/2016 0928   CHOL 128 10/22/2015 0816   TRIG 242 (H) 04/17/2016 [id number]  HDL 34 (L) 04/17/2016 0928   HDL 30 (L) 10/22/2015 0816   CHOLHDL 4.9 04/17/2016 0928   VLDL 48 (H) 04/17/2016 0928   LDLCALC 84 04/17/2016 0928   LDLCALC 48 10/22/2015 0816      Wt Readings from Last 3 Encounters:  05/26/20 174 lb (78.9 kg)  10/22/19 186 lb 4 oz (84.5 kg)  10/13/19 182 lb 9.6 oz (82.8 kg)      Other studies Reviewed: Additional studies/ records that were reviewed today include: ED records and neurology note Review of the above records demonstrates:  Please see elsewhere in the note.     ASSESSMENT AND PLAN:  CAD:   The patient has no new sypmtoms.  No further cardiovascular testing is indicated.  We will continue with aggressive risk reduction and meds as listed.  He can reduce his aspirin to 81 mg daily.  DYSLIPIDEMIA:   LDL was from his primary care provider was 60 earlier this month.  He will continue the meds as listed.  I reviewed these labs.    HTN: Blood pressures is well controlled.  No change in therapy.   BALANCE AND MEMORY:   Balance likely related to neuropathy.  His memory is improved.  No change in therapy.   HYPOTHYROID:   TSH was checked in January and was within normal range.    Current medicines are reviewed at length with the patient today.  The patient does not have concerns regarding medicines.  The following changes have been made:  As above  Labs/ tests ordered today include: None  No orders of the defined  types were placed in this encounter.    Disposition:   FU with me in 12 months.     Signed, Minus Breeding, MD  05/26/2020 10:46 AM    [facility]

## 2020-05-26 ENCOUNTER — Ambulatory Visit: Payer: Medicare HMO | Admitting: Cardiology

## 2020-05-26 ENCOUNTER — Encounter: Payer: Self-pay | Admitting: Cardiology

## 2020-05-26 ENCOUNTER — Other Ambulatory Visit: Payer: Self-pay

## 2020-05-26 VITALS — BP 108/70 | HR 76 | Ht 71.0 in | Wt 174.0 lb

## 2020-05-26 DIAGNOSIS — I10 Essential (primary) hypertension: Secondary | ICD-10-CM

## 2020-05-26 DIAGNOSIS — I2510 Atherosclerotic heart disease of native coronary artery without angina pectoris: Secondary | ICD-10-CM

## 2020-05-26 DIAGNOSIS — E785 Hyperlipidemia, unspecified: Secondary | ICD-10-CM

## 2020-05-26 DIAGNOSIS — E039 Hypothyroidism, unspecified: Secondary | ICD-10-CM

## 2020-05-26 MED ORDER — ASPIRIN EC 81 MG PO TBEC
81.0000 mg | DELAYED_RELEASE_TABLET | Freq: Every day | ORAL | 3 refills | Status: AC
Start: 2020-05-26 — End: ?

## 2020-05-26 NOTE — Patient Instructions (Signed)
Medication Instructions:  Please decrease Aspirin to 81 mg a day.  Continue all other medications as listed.  *If you need a refill on your cardiac medications before your next appointment, please call your pharmacy*  Follow-Up: At [facility], you and your health needs are our priority.  As part of our continuing mission to provide you with exceptional heart care, we have created designated Provider Care Teams.  These Care Teams include your primary Cardiologist (physician) and Advanced Practice Providers (APPs -  Physician Assistants and Nurse Practitioners) who all work together to provide you with the care you need, when you need it.  We recommend signing up for the patient portal called "MyChart".  Sign up information is provided on this After Visit Summary.  MyChart is used to connect with patients for Virtual Visits (Telemedicine).  Patients are able to view lab/test results, encounter notes, upcoming appointments, etc.  Non-urgent messages can be sent to your provider as well.   To learn more about what you can do with MyChart, go to [email or web address].    Your next appointment:   12 month(s)  The format for your next appointment:   In Person  Provider:   Dr Minus Breeding   Thank you for choosing [facility]!!

## 2020-06-14 ENCOUNTER — Ambulatory Visit: Payer: Medicare HMO | Admitting: Podiatry

## 2020-06-14 ENCOUNTER — Other Ambulatory Visit: Payer: Self-pay

## 2020-06-14 DIAGNOSIS — B351 Tinea unguium: Secondary | ICD-10-CM | POA: Diagnosis not present

## 2020-06-14 DIAGNOSIS — R21 Rash and other nonspecific skin eruption: Secondary | ICD-10-CM

## 2020-06-14 DIAGNOSIS — M79674 Pain in right toe(s): Secondary | ICD-10-CM | POA: Diagnosis not present

## 2020-06-14 DIAGNOSIS — E1149 Type 2 diabetes mellitus with other diabetic neurological complication: Secondary | ICD-10-CM | POA: Diagnosis not present

## 2020-06-14 DIAGNOSIS — R2681 Unsteadiness on feet: Secondary | ICD-10-CM

## 2020-06-14 DIAGNOSIS — M79675 Pain in left toe(s): Secondary | ICD-10-CM

## 2020-06-14 MED ORDER — TRIAMCINOLONE ACETONIDE 0.025 % EX OINT
1.0000 | TOPICAL_OINTMENT | Freq: Two times a day (BID) | CUTANEOUS | 0 refills | Status: DC
Start: 2020-06-14 — End: 2020-10-19

## 2020-06-15 ENCOUNTER — Ambulatory Visit: Payer: Medicare HMO | Admitting: Podiatry

## 2020-06-17 NOTE — Progress Notes (Signed)
Subjective: 70 y.o. returns the office today for painful, elongated, thickened toenails which he cannot trim himself. Denies any redness or drainage around the nails. No open lesions he reports there is a red rash in the top of his right foot.  There is no recurrence of the blister at this time.  Also his wife concerned about increased falls and balance issues.  Denies any acute changes since last appointment and no new complaints today. Denies any systemic complaints such as fevers, chills, nausea, vomiting.   PCP: [name], MD  Objective: NAD- presents with wife DP/PT pulses palpable, CRT less than 3 seconds Nails hypertrophic, dystrophic, elongated, brittle, discolored 10. There is tenderness overlying the nails 1-5 bilaterally. There is no surrounding erythema or drainage along the nail sites. Flat erythematous area in the dorsal aspect the right foot.  No blister formation.  There is no open lesions or drainage. No open lesions or pre-ulcerative lesions are identified. No other areas of tenderness bilateral lower extremities. No overlying edema, erythema, increased warmth. No pain with calf compression, swelling, warmth, erythema.  Assessment: Patient presents with symptomatic onychomycosis, skin rash, gait instability  Plan: -Treatment options including alternatives, risks, complications were discussed -Nails sharply debrided 10 without complication/bleeding. -Prescribed triamcinolone cream for the rash in the right foot -Discussed physical therapy.  Printed order home physical therapy for him. -Discussed daily foot inspection. If there are any changes, to call the office immediately.  -Follow-up in 3 months or sooner if any problems are to arise. In the meantime, encouraged to call the office with any questions, concerns, changes symptoms.  Celesta Gentile, DPM

## 2020-06-23 ENCOUNTER — Telehealth: Payer: Self-pay

## 2020-06-23 NOTE — Telephone Encounter (Signed)
Order for PT, office note, and demographics faxed to [facility] for gait training/ balance issues

## 2020-07-07 ENCOUNTER — Other Ambulatory Visit: Payer: Self-pay | Admitting: Podiatry

## 2020-07-23 ENCOUNTER — Other Ambulatory Visit: Payer: Self-pay | Admitting: Neurology

## 2020-08-20 ENCOUNTER — Other Ambulatory Visit: Payer: Self-pay

## 2020-08-20 ENCOUNTER — Encounter (HOSPITAL_BASED_OUTPATIENT_CLINIC_OR_DEPARTMENT_OTHER): Payer: Self-pay

## 2020-08-20 ENCOUNTER — Emergency Department (HOSPITAL_BASED_OUTPATIENT_CLINIC_OR_DEPARTMENT_OTHER)
Admission: EM | Admit: 2020-08-20 | Discharge: 2020-08-20 | Disposition: A | Discharge status: Home / Self Care | Payer: Medicare HMO | Attending: Emergency Medicine | Admitting: Emergency Medicine

## 2020-08-20 DIAGNOSIS — M79672 Pain in left foot: Secondary | ICD-10-CM | POA: Diagnosis present

## 2020-08-20 DIAGNOSIS — L03116 Cellulitis of left lower limb: Secondary | ICD-10-CM | POA: Insufficient documentation

## 2020-08-20 DIAGNOSIS — E119 Type 2 diabetes mellitus without complications: Secondary | ICD-10-CM | POA: Diagnosis not present

## 2020-08-20 DIAGNOSIS — E039 Hypothyroidism, unspecified: Secondary | ICD-10-CM | POA: Insufficient documentation

## 2020-08-20 DIAGNOSIS — Z79899 Other long term (current) drug therapy: Secondary | ICD-10-CM | POA: Insufficient documentation

## 2020-08-20 DIAGNOSIS — Z7982 Long term (current) use of aspirin: Secondary | ICD-10-CM | POA: Diagnosis not present

## 2020-08-20 DIAGNOSIS — I2510 Atherosclerotic heart disease of native coronary artery without angina pectoris: Secondary | ICD-10-CM | POA: Diagnosis not present

## 2020-08-20 DIAGNOSIS — Z85828 Personal history of other malignant neoplasm of skin: Secondary | ICD-10-CM | POA: Diagnosis not present

## 2020-08-20 DIAGNOSIS — Z87891 Personal history of nicotine dependence: Secondary | ICD-10-CM | POA: Diagnosis not present

## 2020-08-20 DIAGNOSIS — Z951 Presence of aortocoronary bypass graft: Secondary | ICD-10-CM | POA: Diagnosis not present

## 2020-08-20 DIAGNOSIS — Z7984 Long term (current) use of oral hypoglycemic drugs: Secondary | ICD-10-CM | POA: Diagnosis not present

## 2020-08-20 DIAGNOSIS — I10 Essential (primary) hypertension: Secondary | ICD-10-CM | POA: Insufficient documentation

## 2020-08-20 DIAGNOSIS — Z8546 Personal history of malignant neoplasm of prostate: Secondary | ICD-10-CM | POA: Insufficient documentation

## 2020-08-20 MED ORDER — CEPHALEXIN 500 MG PO CAPS: 500.0000 mg | ORAL_CAPSULE | Freq: Four times a day (QID) | ORAL | 0 refills | Status: AC

## 2020-08-20 MED ORDER — CEPHALEXIN 250 MG PO CAPS
500.0000 mg | ORAL_CAPSULE | Freq: Once | ORAL | Status: AC
  Administered 2020-08-20: 500 mg via ORAL
  Filled 2020-08-20: qty 2

## 2020-08-20 NOTE — ED Triage Notes (Addendum)
Pt c/o left foot pain x 2 days-denies injury-reports hx of neuropathy-NAD-steady gait

## 2020-08-20 NOTE — ED Notes (Signed)
Patient states using capsicin cream last 2 days, with relief in right foot but not left.

## 2020-08-20 NOTE — ED Provider Notes (Signed)
[facility] Provider Note   CSN: [id number] Arrival date & time: 08/20/20  2055     History Chief Complaint  Patient presents with  . Foot Pain    Dean Cole is a 70 y.o. male.  The history is provided by the patient and medical records. No language interpreter was used.  Foot Pain This is a new problem. The current episode started 2 days ago. The problem occurs constantly. The problem has not changed since onset.Pertinent negatives include no chest pain, no abdominal pain, no headaches and no shortness of breath. The symptoms are aggravated by walking (palpation). Nothing relieves the symptoms. He has tried nothing for the symptoms. The treatment provided no relief.       Past Medical History:  Diagnosis Date  . Anxiety   . Basal cell carcinoma   . Cholecystitis   . Controlled type 2 DM with peripheral circulatory disorder ([place]) 09/20/2014  . Coronary artery disease   . Depression   . Headache(784.0)   . HTN (hypertension)   . Hx of skin cancer, basal cell   . Hyperlipidemia   . Hypothyroidism   . Kidney stones   . Melanoma ([place])   . PAD (peripheral artery disease) ([place]) 09/20/2014  . Prostate cancer ([place])   . Restless leg syndrome   . Sepsis [facility])     Patient Active Problem List   Diagnosis Date Noted  . 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 Acute metabolic encephalopathy 19/37/9024  . Urinary urgency 10/05/2019  . Hyperglycemia due to diabetes mellitus ([place]) 10/05/2019  . Educated about COVID-19 virus infection 08/05/2019  . Acute kidney failure ([place]) 07/07/2019  . AMS (altered mental status) 07/07/2019  . Postural dizziness 07/07/2019  . Hypocalcemia 07/07/2019  . Sepsis ([place]) 07/07/2019  . Colitis 07/07/2019  . N&V (nausea and vomiting) 07/06/2019  . Lung nodule 07/06/2019  . Fever 07/06/2019  . Malignant neoplasm of prostate ([place]) 07/23/2018  . Cellulitis of toe 05/23/2018  . Acquired deformity of  toenail 05/15/2018  . Foot callus 04/10/2018  . Long toenail 04/10/2018  . Encounter for Medicare annual wellness exam 12/19/2017  . Ureteral stone with hydronephrosis 08/04/2017  . Anxiety, generalized 03/19/2017  . Essential hypertension 03/19/2017  . Moderate episode of recurrent major depressive disorder ([place]) 03/19/2017  . Hearing loss 04/19/2016  . PAD (peripheral artery disease) ([place]) 09/20/2014  . Controlled type 2 DM with peripheral circulatory disorder ([place]) 09/20/2014  . Depression 09/16/2014  . Erectile dysfunction 07/02/2014  . Type II diabetes mellitus with renal manifestations ([place]) 03/04/2014  . Eczema 12/25/2012  . Anxiety   . Dyslipidemia 02/29/2012  . Hypertensive heart disease without CHF   . Hypothyroidism   . Coronary artery disease   . Basal cell carcinoma   . Headache   . Restless leg syndrome   . Melanoma of skin ([place]) 06/14/2001    Past Surgical History:  Procedure Laterality Date  . BASAL CELL CARCINOMA EXCISION  2000   Dr,Arkin   . CARDIAC CATHETERIZATION  07/07/2011  . CHOLECYSTECTOMY  09/14/2011   Procedure: LAPAROSCOPIC CHOLECYSTECTOMY WITH INTRAOPERATIVE CHOLANGIOGRAM;  Surgeon: Gwenyth Ober, MD;  Location: [facility];  Service: General;  Laterality: N/A;  . CORONARY ARTERY BYPASS GRAFT  07/10/2011   Procedure: CORONARY ARTERY BYPASS GRAFTING (CABG);  Surgeon: Ivin Poot, MD;  Location: [facility];  Service: Open Heart Surgery;  Laterality: N/A;  . EXCISIONAL HEMORRHOIDECTOMY  1997  . HEMORROIDECTOMY  2000  . LEFT HEART  CATHETERIZATION WITH CORONARY ANGIOGRAM N/A 07/07/2011   Procedure: LEFT HEART CATHETERIZATION WITH CORONARY ANGIOGRAM;  Surgeon: Kathleene Hazel, MD;  Location: [facility] C[facility] LAB;  Service: Cardiovascular;  Laterality: N/A;       Family History  Problem Relation Age of Onset  . Coronary artery disease Father   . Prostate cancer Father   . Diabetes Father   . Leukemia Mother   . Suicidality Brother   . Drug abuse Brother   .  Coronary artery disease Paternal Grandfather   . Breast cancer Neg Hx   . Colon cancer Neg Hx   . Pancreatic cancer Neg Hx     Social History   Tobacco Use  . Smoking status: Former Smoker    Packs/day: 1.00    Years: 21.00    Pack years: 21.00    Types: Cigarettes    Quit date: 08/08/1981    Years since quitting: 39.0  . Smokeless tobacco: Former Clinical biochemist  . Vaping Use: Never used  Substance Use Topics  . Alcohol use: No    Comment: former  . Drug use: No    Home Medications Prior to Admission medications   Medication Sig Start Date End Date Taking? Authorizing Provider  aspirin EC 81 MG tablet Take 1 tablet (81 mg total) by mouth daily. Swallow whole. 05/26/20   Rollene Rotunda, MD  Cholecalciferol (VITAMIN D3 PO) Take 2 tablets by mouth daily.    [provider]  clotrimazole-betamethasone (LOTRISONE) cream Apply 1 application topically 2 (two) times daily as needed (for skin irritation). 10/13/19   Vivi Barrack, DPM  diazepam (VALIUM) 10 MG tablet Take 10 mg by mouth 2 (two) times daily. 09/22/19   [provider]  donepezil (ARICEPT) 10 MG tablet TAKE 1 TABLET BY MOUTH EVERYDAY AT BEDTIME 01/29/20   Jaffe, Adam R, DO  gabapentin (NEURONTIN) 100 MG capsule TAKE 1 CAPSULE BY MOUTH THREE TIMES A DAY 07/07/20   Vivi Barrack, DPM  imipramine (TOFRANIL) 25 MG tablet Take 100 mg by mouth at bedtime.  12/23/18   [provider]  levothyroxine (SYNTHROID) 175 MCG tablet Take 175 mcg by mouth daily before breakfast.  07/08/19   [provider]  magnesium oxide (MAG-OX) 400 (241.3 Mg) MG tablet Take 1 tablet (400 mg total) by mouth daily. Patient taking differently: Take 400 mg by mouth at bedtime. 07/08/19   Black, Lesle Chris, NP  metFORMIN (GLUCOPHAGE) 500 MG tablet Take 1 tablet (500 mg total) by mouth 2 (two) times daily with a meal. 08/12/15   Mast, Man X, NP  metoprolol tartrate (LOPRESSOR) 25 MG tablet Take 25 mg by mouth 2 (two) times  daily.    [provider]  mupirocin ointment (BACTROBAN) 2 % Apply 1 application topically 2 (two) times daily. 11/14/19   Vivi Barrack, DPM  MYRBETRIQ 25 MG TB24 tablet TAKE 1 TABLET BY MOUTH EVERY DAY Patient taking differently: Take 25 mg by mouth daily.  04/27/19   Bruning, Ashlyn, PA-C  NITROSTAT 0.4 MG SL tablet Place 0.4 mg under the tongue every 5 (five) minutes as needed for chest pain. Reported on 10/26/2015 07/06/11   [provider]  pentazocine-naloxone (TALWIN NX) 50-0.5 MG tablet Take 1 tablet by mouth in the morning and at bedtime.    [provider]  simvastatin (ZOCOR) 20 MG tablet TAKE ONE TABLET BY MOUTH ONCE DAILY FOR CHOLESTEROL Patient taking differently: Take 20 mg by mouth daily. Take one tablet by  mouth once daily for cholesterol 10/16/16   Reed, Tiffany L, DO  tamsulosin (FLOMAX) 0.4 MG CAPS capsule Take 0.4 mg by mouth 2 (two) times daily.    [provider]  triamcinolone (KENALOG) 0.025 % ointment Apply 1 application topically 2 (two) times daily. 06/14/20   Trula Slade, DPM    Allergies    Adhesive [tape]  Review of Systems   Review of Systems  Constitutional: Negative for chills, diaphoresis, fatigue and fever.  HENT: Negative for congestion.   Respiratory: Negative for chest tightness and shortness of breath.   Cardiovascular: Negative for chest pain.  Gastrointestinal: Negative for abdominal pain, diarrhea, nausea and vomiting.  Genitourinary: Negative for flank pain.  Musculoskeletal: Negative for back pain.  Skin: Positive for rash. Negative for wound.  Neurological: Positive for numbness (at baseline). Negative for weakness, light-headedness and headaches.  Psychiatric/Behavioral: Negative for agitation.  All other systems reviewed and are negative.   Physical Exam Updated Vital Signs BP 130/81 (BP Location: Left Arm)   Pulse 95   Temp 98.5 F (36.9 C) (Oral)   Resp 20   Ht 5\' 11"  (1.803 m)   Wt  72.6 kg   SpO2 97%   BMI 22.32 kg/m   Physical Exam Vitals and nursing note reviewed.  Constitutional:      General: He is not in acute distress.    Appearance: He is well-developed. He is not ill-appearing, toxic-appearing or diaphoretic.  HENT:     Head: Normocephalic and atraumatic.     Mouth/Throat:     Mouth: Mucous membranes are moist.  Eyes:     Conjunctiva/sclera: Conjunctivae normal.  Cardiovascular:     Rate and Rhythm: Normal rate and regular rhythm.     Heart sounds: No murmur heard.   Pulmonary:     Effort: Pulmonary effort is normal. No respiratory distress.     Breath sounds: Normal breath sounds.  Abdominal:     Palpations: Abdomen is soft.     Tenderness: There is no abdominal tenderness.  Musculoskeletal:        General: Tenderness present. No swelling or signs of injury.     Right lower leg: No edema.     Left lower leg: No edema.     Right foot: Normal capillary refill. No bony tenderness. Normal pulse.     Left foot: Normal capillary refill. Tenderness present. No swelling, laceration or bony tenderness. Normal pulse.       Legs:     Comments: Area of erythema on the medial left foot.  Patient had intact DP and PT pulse in both lower extremities.  Normal strength.  Cap refill is symmetric.  No evidence of abscess.  Sensation is symmetric and reports that is at his baseline of neuropathy.  Exam otherwise unremarkable.  Skin:    General: Skin is warm and dry.     Capillary Refill: Capillary refill takes less than 2 seconds.     Coloration: Skin is not pale.     Findings: Erythema and rash present.  Neurological:     General: No focal deficit present.     Mental Status: He is alert.  Psychiatric:        Mood and Affect: Mood normal.     ED Results / Procedures / Treatments   Labs (all labs ordered are listed, but only abnormal results are displayed) Labs Reviewed - No data to display  EKG None  Radiology No results  found.  Procedures Procedures  Medications Ordered in ED Medications  cephALEXin (KEFLEX) capsule 500 mg (500 mg Oral Given 08/20/20 2237)    ED Course  I have reviewed the triage vital signs and the nursing notes.  Pertinent labs & imaging results that were available during my care of the patient were reviewed by me and considered in my medical decision making (see chart for details).    MDM Rules/Calculators/A&P                          Dean Cole is a 70 y.o. male with a past medical history significant for diabetes with chronic peripheral neuropathy, hypertension, hypothyroidism, CAD, restless leg syndrome, chronic foot pain managed by podiatry, and kidney stones who presents with left foot pain.  Patient reports that for his neuropathic pain he has been using capsaicin cream and has had some success but over the last 2 days has had pain and redness in the medial left foot.  He is concerned that may be a cellulitis or skin infection.  He denies any trauma.  He denies any numbness aside from his chronic numbness.  No weakness.  He is still able to ambulate.  Denies systemic signs infection such as fevers, chills, or any pain or redness in his ankle, knee, or rest of the leg.  He otherwise is feeling well.  He denies any injuries.  On exam, lungs clear and chest nontender.  Abdomen nontender.  Patient has a 3 cm area of erythema to his medial left foot.  No tenderness of the great toe and no evidence of gout.  No crepitance.  No fluctuance.  Palpable DP and PT pulse in both feet.  It is not cold.  No evidence of vascular compromise or abnormality initially.  Exam otherwise unremarkable.  Had a shared decision-making conversation with patient and spouse.  Initially given his medical problems and evidence of cellulitis, we discussed getting x-ray and labs however he would like to try just prescription and dose of antibiotics and follow-up with his podiatrist and PCP.  If symptoms do not  improve, he will return for x-ray and labs and further management.  As he had palpable pulses, low suspicion for an arterial vascular problem at this time.  Feel he is safe for discharge home for outpatient antibiotics and close follow-up with his podiatrist.  He had no other questions or concerns and was discharged in good condition.   Final Clinical Impression(s) / ED Diagnoses Final diagnoses:  Cellulitis of left foot    Rx / DC Orders ED Discharge Orders         Ordered    cephALEXin (KEFLEX) 500 MG capsule  4 times daily        08/20/20 2228         Clinical Impression: 1. Cellulitis of left foot     Disposition: Discharge  Condition: Good  I have discussed the results, Dx and Tx plan with the pt(& family if present). He/she/they expressed understanding and agree(s) with the plan. Discharge instructions discussed at great length. Strict return precautions discussed and pt &/or family have verbalized understanding of the instructions. No further questions at time of discharge.    Discharge Medication List as of 08/20/2020 10:29 PM    START taking these medications   Details  cephALEXin (KEFLEX) 500 MG capsule Take 1 capsule (500 mg total) by mouth 4 (four) times daily for 10 days., Starting Fri 08/20/2020, Until Mon 08/30/2020, Print  Follow Up: [name], [place] [address] [phone]     your [facility] [address] [id number] [place]       Izora Benn, Gwenyth Allegra, MD 08/20/20 [phone]

## 2020-08-20 NOTE — Discharge Instructions (Signed)
Your history and exam today are consistent with a small area of cellulitis on your left medial foot.  You had good pulses on exam so we have low suspicion for any arterial abnormality or blockage.  Your exam is consistent with prior after our discussion.  There is no other acute abnormalities and with our shared decision made conversation, we agreed to hold on imaging or labs and instead give you a dose of antibiotics and prescription for antibiotics and follow-up with your PCP and podiatrist.  If any symptoms change or worsen or start to spread, please return to the nearest emergency department for further work-up.

## 2020-08-26 ENCOUNTER — Other Ambulatory Visit: Payer: Self-pay

## 2020-08-26 ENCOUNTER — Ambulatory Visit: Payer: Medicare HMO | Admitting: Podiatry

## 2020-08-26 DIAGNOSIS — B351 Tinea unguium: Secondary | ICD-10-CM

## 2020-08-26 DIAGNOSIS — M79675 Pain in left toe(s): Secondary | ICD-10-CM

## 2020-08-26 DIAGNOSIS — L03119 Cellulitis of unspecified part of limb: Secondary | ICD-10-CM

## 2020-08-26 DIAGNOSIS — L02619 Cutaneous abscess of unspecified foot: Secondary | ICD-10-CM | POA: Diagnosis not present

## 2020-08-26 DIAGNOSIS — M79674 Pain in right toe(s): Secondary | ICD-10-CM

## 2020-08-26 DIAGNOSIS — E1149 Type 2 diabetes mellitus with other diabetic neurological complication: Secondary | ICD-10-CM

## 2020-08-28 NOTE — Progress Notes (Signed)
Subjective: 70 y.o. returns the office today for painful, elongated, thickened toenails which he cannot trim himself.   He did recently have to go to the emergency department for cellulitis of both feet.  He was started antibiotics which he is still taking and his wife said the feet are doing much better.  Denies any open sores currently.  Denies any fevers, chills, nausea, vomiting.  He has no other concerns today.  PCP: [name], MD  Objective: NAD- presents with wife DP/PT pulses palpable, CRT less than 3 seconds Nails hypertrophic, dystrophic, elongated, brittle, discolored 10. There is tenderness overlying the nails 1-5 bilaterally. There is no surrounding erythema or drainage along the nail sites. There is no significant cellulitis identified today and there is no warmth.  There is a slight almost bruised type of look to the medial aspect of the foot and this is what his wife states that there was cellulitis that has resolved.  There is no tenderness.  Unable to elicit any area of pinpoint tenderness.  There is no open lesions identified today.   No pain with calf compression, swelling, warmth, erythema.  Assessment: Patient presents with symptomatic onychomycosis, likely resolving cellulitis  Plan: -Treatment options including alternatives, risks, complications were discussed -Nails sharply debrided 10 without complication/bleeding. -Finished course of antibiotics but clinically appears to be getting much better and they also state he is continue to improve.  Monitor for any skin breakdown or any reoccurrence and is now should there be any issues.  Return in about 2 months (around 10/26/2020).  Trula Slade DPM

## 2020-08-29 DIAGNOSIS — Z789 Other specified health status: Secondary | ICD-10-CM | POA: Insufficient documentation

## 2020-09-13 ENCOUNTER — Ambulatory Visit: Payer: Medicare HMO | Admitting: Podiatry

## 2020-09-16 ENCOUNTER — Other Ambulatory Visit: Payer: Self-pay | Admitting: Neurology

## 2020-09-17 NOTE — Telephone Encounter (Signed)
Patient needs to make an appointment before we can refill

## 2020-09-24 ENCOUNTER — Encounter: Payer: Self-pay | Admitting: Podiatry

## 2020-10-17 ENCOUNTER — Other Ambulatory Visit: Payer: Self-pay | Admitting: Podiatry

## 2020-10-19 ENCOUNTER — Other Ambulatory Visit: Payer: Self-pay

## 2020-10-19 ENCOUNTER — Ambulatory Visit: Payer: Medicare HMO | Admitting: Podiatry

## 2020-10-19 DIAGNOSIS — M79675 Pain in left toe(s): Secondary | ICD-10-CM

## 2020-10-19 DIAGNOSIS — E1149 Type 2 diabetes mellitus with other diabetic neurological complication: Secondary | ICD-10-CM

## 2020-10-19 DIAGNOSIS — B351 Tinea unguium: Secondary | ICD-10-CM | POA: Diagnosis not present

## 2020-10-19 DIAGNOSIS — M79674 Pain in right toe(s): Secondary | ICD-10-CM | POA: Diagnosis not present

## 2020-10-19 NOTE — Progress Notes (Signed)
Subjective: 70 y.o. returns the office today for painful, elongated, thickened toenails which he cannot trim himself.  Denies any open sores or any swelling or redness or any signs of infection today.  He has no new concerns today. PCP: [name], MD  Objective: NAD- presents with wife DP/PT pulses palpable, CRT less than 3 seconds Nails hypertrophic, dystrophic, elongated, brittle, discolored 10. There is tenderness overlying the nails 1-5 bilaterally. There is no surrounding erythema or drainage along the nail sites. No other areas of tenderness identified today.  There is no open lesions identified today.   No pain with calf compression, swelling, warmth, erythema.  Assessment: Patient presents with symptomatic onychomycosis  Plan: -Treatment options including alternatives, risks, complications were discussed -Nails sharply debrided 10 without complication/bleeding. -Daily foot inspection.  Return in about 3 months (around 01/19/2021).  Trula Slade DPM

## 2020-10-26 ENCOUNTER — Ambulatory Visit: Payer: Medicare HMO | Admitting: Podiatry

## 2020-11-08 ENCOUNTER — Other Ambulatory Visit: Payer: Self-pay

## 2020-11-08 ENCOUNTER — Ambulatory Visit: Payer: Medicare HMO | Admitting: Podiatry

## 2020-11-08 DIAGNOSIS — L03119 Cellulitis of unspecified part of limb: Secondary | ICD-10-CM

## 2020-11-08 DIAGNOSIS — L97511 Non-pressure chronic ulcer of other part of right foot limited to breakdown of skin: Secondary | ICD-10-CM | POA: Diagnosis not present

## 2020-11-08 DIAGNOSIS — E1149 Type 2 diabetes mellitus with other diabetic neurological complication: Secondary | ICD-10-CM | POA: Diagnosis not present

## 2020-11-08 DIAGNOSIS — L02619 Cutaneous abscess of unspecified foot: Secondary | ICD-10-CM

## 2020-11-08 MED ORDER — CEPHALEXIN 500 MG PO CAPS: 500.0000 mg | ORAL_CAPSULE | Freq: Three times a day (TID) | ORAL | 0 refills | Status: DC

## 2020-11-11 NOTE — Progress Notes (Signed)
Subjective: 70 year old male presents the office with his wife for concerns of a wound on his third toe as well as contracture of His right foot which just started.  He denies any drainage or pus or any increase in swelling or redness of the foot.  He denies any fevers or chills.  No nausea or vomiting.  Objective: AAO x3 DP/PT pulses palpable bilaterally, CRT less than 3 seconds On the dorsal aspect third toe is a small superficial area skin breakdown.  There is no significant edema or erythema.  The dorsal aspect of foot there are small blanchable erythematous areas although faint in color which are new.  This is how he has previously had cellulitis that started.  There is no areas of fluctuance or crepitation.  No drainage or pus. No open lesions or pre-ulcerative lesions.  No pain with calf compression, swelling, warmth, erythema  Assessment: 70 year old male with right third toe superficial abrasion, localized dorsal foot erythema although mild  Plan: -All treatment options discussed with the patient including all alternatives, risks, complications.  -There is no significant tissue to debride today.  There is no active draining.  I am going to start him on antibiotics given his history of cellulitis.  Prescribed cephalexin.  He is antibiotic ointment as well.  Monitor closely signs or symptoms of worsening erythema or signs of infection report emergency department should any occur. -Patient encouraged to call the office with any questions, concerns, change in symptoms.   *If symptoms continue next appointment x-ray  Trula Slade DPM

## 2020-11-17 ENCOUNTER — Telehealth: Payer: Self-pay | Admitting: *Deleted

## 2020-11-17 NOTE — Telephone Encounter (Signed)
Patient's wife is calling and wanted to know if she should schedule a sooner appointment(07/26),has completed his antibiotic.The foot is hot to touch across the top,and red. The swelling has gone down and it look better.She is concerned because he fell yesterday after trying to reach something off the floor.Please advise.

## 2020-11-18 ENCOUNTER — Other Ambulatory Visit: Payer: Self-pay | Admitting: Podiatry

## 2020-11-18 MED ORDER — CEPHALEXIN 500 MG PO CAPS: 500.0000 mg | ORAL_CAPSULE | Freq: Three times a day (TID) | ORAL | 0 refills | Status: DC

## 2020-11-18 NOTE — Telephone Encounter (Signed)
Returned call to patient's wife and gave recommedations per physician, verbalized understanding.

## 2020-11-23 ENCOUNTER — Ambulatory Visit: Payer: Medicare HMO | Admitting: Podiatry

## 2020-11-23 ENCOUNTER — Other Ambulatory Visit: Payer: Self-pay

## 2020-11-23 DIAGNOSIS — L02619 Cutaneous abscess of unspecified foot: Secondary | ICD-10-CM

## 2020-11-23 DIAGNOSIS — L97511 Non-pressure chronic ulcer of other part of right foot limited to breakdown of skin: Secondary | ICD-10-CM

## 2020-11-23 DIAGNOSIS — L03119 Cellulitis of unspecified part of limb: Secondary | ICD-10-CM | POA: Diagnosis not present

## 2020-11-23 DIAGNOSIS — E1149 Type 2 diabetes mellitus with other diabetic neurological complication: Secondary | ICD-10-CM

## 2020-11-28 NOTE — Progress Notes (Signed)
Subjective: 70 year old male presents the office with his wife for follow-up evaluation of wound on the right third toe as well as foot erythema.  His wife states that since starting the second round of antibiotics he has been doing better.  No swelling or redness.  No drainage.  No fevers or chills.  No recent falls or injury.  No other concerns.  Objective: AAO x3 DP/PT pulses palpable bilaterally, CRT less than 3 seconds Area of concern on the dorsal third toe on the previous superficial area skin breakdown has resolved.  There is no edema, erythema present bilaterally.  No open sores or any drainage.  There is no fluctuation crepitation.  There is no area of pinpoint tenderness.  He does describe some burning to his feet No open lesions or pre-ulcerative lesions.  No pain with calf compression, swelling, warmth, erythema  Assessment: 70 year old male healing right third toe abrasion, erythema  Plan: -All treatment options discussed with the patient including all alternatives, risks, complications. -Overall doing much better.  The wounds of healed and there is no pain.  No erythema or warmth.  Hold off on x-rays because of this today.  Encouraged elevation and finish the course of antibiotics.  Monitor for any reoccurrence.  Return in about 4 weeks (around 12/21/2020).  This is for routine care as well as for follow-up evaluation.  Trula Slade DPM

## 2020-12-04 ENCOUNTER — Other Ambulatory Visit: Payer: Self-pay | Admitting: Podiatry

## 2020-12-07 DIAGNOSIS — R197 Diarrhea, unspecified: Secondary | ICD-10-CM | POA: Insufficient documentation

## 2020-12-07 DIAGNOSIS — R270 Ataxia, unspecified: Secondary | ICD-10-CM | POA: Insufficient documentation

## 2020-12-13 DIAGNOSIS — L818 Other specified disorders of pigmentation: Secondary | ICD-10-CM | POA: Insufficient documentation

## 2020-12-13 DIAGNOSIS — Z789 Other specified health status: Secondary | ICD-10-CM | POA: Insufficient documentation

## 2020-12-21 ENCOUNTER — Ambulatory Visit: Payer: Medicare HMO | Admitting: Podiatry

## 2020-12-21 ENCOUNTER — Other Ambulatory Visit: Payer: Self-pay

## 2020-12-21 ENCOUNTER — Encounter: Payer: Self-pay | Admitting: Podiatry

## 2020-12-21 DIAGNOSIS — M79675 Pain in left toe(s): Secondary | ICD-10-CM | POA: Diagnosis not present

## 2020-12-21 DIAGNOSIS — E1149 Type 2 diabetes mellitus with other diabetic neurological complication: Secondary | ICD-10-CM | POA: Diagnosis not present

## 2020-12-21 DIAGNOSIS — M79674 Pain in right toe(s): Secondary | ICD-10-CM

## 2020-12-21 DIAGNOSIS — B351 Tinea unguium: Secondary | ICD-10-CM

## 2020-12-27 NOTE — Progress Notes (Signed)
Subjective: 70 year old male presents the office with his wife for follow-up evaluation of wound on the right third toe as well as foot erythema and have the nails trimmed.  Today her right third toe is doing better.  No open sores that he reports.  No swelling or redness or any drainage.  Still in some balance issues and we discussed physical therapy previously but the wife want to hold off on this until the house gets settled.   Objective: AAO x3 DP/PT pulses palpable bilaterally, CRT less than 3 seconds Area of concern on the dorsal third toe the wound is healed there is no edema, erythema, drainage or pus or any signs infection.  No open sores bilaterally. Nails are hypertrophic, dystrophic, brittle, discolored, elongated 10. No surrounding redness or drainage. Tenderness nails 1-5 bilaterally. No open lesions or pre-ulcerative lesions are identified today. No pain with calf compression, swelling, warmth, erythema  Assessment: 70 year old male healed right third toe wound; symptomatic onychomycosis  Plan: -All treatment options discussed with the patient including all alternatives, risks, complications. -Right third toe is doing better.  No areas of skin breakdown bilaterally.  Monitor for any new issues.  Daily foot inspection discussed. -Sharply debrided the nails x10 without any complications or bleeding.  Return in about 9 weeks (around 02/22/2021).  Trula Slade DPM

## 2021-01-20 ENCOUNTER — Ambulatory Visit: Payer: Medicare HMO | Admitting: Podiatry

## 2021-02-22 ENCOUNTER — Ambulatory Visit: Payer: Medicare HMO | Admitting: Podiatry

## 2021-03-13 ENCOUNTER — Other Ambulatory Visit: Payer: Self-pay | Admitting: Podiatry

## 2021-03-14 NOTE — Telephone Encounter (Signed)
Please advise 

## 2021-03-15 ENCOUNTER — Ambulatory Visit (INDEPENDENT_AMBULATORY_CARE_PROVIDER_SITE_OTHER): Payer: Medicare HMO | Admitting: Podiatry

## 2021-03-15 ENCOUNTER — Other Ambulatory Visit: Payer: Self-pay

## 2021-03-15 DIAGNOSIS — M79675 Pain in left toe(s): Secondary | ICD-10-CM

## 2021-03-15 DIAGNOSIS — E1149 Type 2 diabetes mellitus with other diabetic neurological complication: Secondary | ICD-10-CM | POA: Diagnosis not present

## 2021-03-15 DIAGNOSIS — B351 Tinea unguium: Secondary | ICD-10-CM

## 2021-03-15 DIAGNOSIS — M79674 Pain in right toe(s): Secondary | ICD-10-CM

## 2021-03-18 NOTE — Progress Notes (Signed)
Subjective: 70 year old male presents the office today with his wife for concerns of thick, elongated toes that he cannot trim himself to both feet.  Denies any open sores or any swelling or redness.  Objective: AAO x3 DP/PT pulses palpable bilaterally, CRT less than 3 seconds Nails are hypertrophic, dystrophic, brittle, discolored, elongated 10.  There are small spicules of the nail present bilateral hallux they have been removed previously.  No surrounding redness or drainage. Tenderness nails 1-5 bilaterally. No open lesions or pre-ulcerative lesions are identified today. No pain with calf compression, swelling, warmth, erythema  Assessment: 70 year old male symptomatic onychomycosis  Plan: -All treatment options discussed with the patient including all alternatives, risks, complications. -Sharply debrided the nails x10 without any complications or bleeding. -Continue moisturizer as needed. -Daily foot inspection  Return in about 9 weeks (around 05/17/2021).  Trula Slade DPM

## 2021-04-13 ENCOUNTER — Other Ambulatory Visit: Payer: Self-pay | Admitting: Urology

## 2021-04-14 NOTE — Progress Notes (Signed)
Sent message, via epic in basket, requesting orders in epic from surgeon.  

## 2021-04-14 NOTE — Patient Instructions (Addendum)
DUE TO COVID-19 ONLY ONE VISITOR IS ALLOWED TO COME WITH YOU AND STAY IN THE WAITING ROOM ONLY DURING PRE OP AND PROCEDURE.   **NO VISITORS ARE ALLOWED IN THE SHORT STAY AREA OR RECOVERY ROOM!!**  IF YOU WILL BE ADMITTED INTO THE HOSPITAL YOU ARE ALLOWED ONLY TWO SUPPORT PEOPLE DURING VISITATION HOURS ONLY (7 AM -8PM)   The support person(s) must pass our screening, gel in and out, and wear a mask at all times, including in the patients room. Patients must also wear a mask when staff or their support person are in the room. Visitors GUEST BADGE MUST BE WORN VISIBLY  One adult visitor may remain with you overnight and MUST be in the room by 8 P.M.  No visitors under the age of 90. Any visitor under the age of 20 must be accompanied by an adult.     Your procedure is scheduled on: 05/06/20   Report to [facility] Main Entrance    Report to admitting at: 7:15 AM   Call this number if you have problems the morning of surgery [phone]   Do not eat food :After Midnight.   May have liquids until : 6:30 AM   day of surgery  CLEAR LIQUID DIET  Foods Allowed                                                                     Foods Excluded  Water, Black Coffee and tea, regular and decaf                             liquids that you cannot  Plain Jell-O in any flavor  (No red)                                           see through such as: Fruit ices (not with fruit pulp)                                     milk, soups, orange juice              Iced Popsicles (No red)                                    All solid food                                   Apple juices Sports drinks like Gatorade (No red) Lightly seasoned clear broth or consume(fat free) Sugar  Sample Menu Breakfast                                Lunch  Supper Cranberry juice                    Beef broth                            Chicken broth Jell-O                                      Grape juice                           Apple juice Coffee or tea                        Jell-O                                      Popsicle                                                Coffee or tea                        Coffee or tea     Oral Hygiene is also important to reduce your risk of infection.                                    Remember - BRUSH YOUR TEETH THE MORNING OF SURGERY WITH YOUR REGULAR TOOTHPASTE   Do NOT smoke after Midnight   Take these medicines the morning of surgery with A SIP OF WATER: gabapentin,metoprolol,Flomax,synthroid,memantine,Diazepam as needed.  DO NOT TAKE ANY ORAL DIABETIC MEDICATIONS DAY OF YOUR SURGERY                              You may not have any metal on your body including hair pins, jewelry, and body piercing             Do not wear lotions, powders, perfumes/cologne, or deodorant               Men may shave face and neck.   Do not bring valuables to the hospital. [place].   Contacts, dentures or bridgework may not be worn into surgery.   Bring small overnight bag day of surgery.    Patients discharged on the day of surgery will not be allowed to drive home.   Special Instructions: Bring a copy of your healthcare power of attorney and living will documents         the day of surgery if you haven't scanned them before.              Please read over the following fact sheets you were given: IF YOU HAVE QUESTIONS ABOUT YOUR PRE-OP INSTRUCTIONS PLEASE CALL [phone]     [place] - Preparing for Surgery Before surgery, you can play an important  role.  Because skin is not sterile, your skin needs to be as free of germs as possible.  You can reduce the number of germs on your skin by washing with CHG (chlorahexidine gluconate) soap before surgery.  CHG is an antiseptic cleaner which kills germs and bonds with the skin to continue killing germs even after washing. Please DO NOT  use if you have an allergy to CHG or antibacterial soaps.  If your skin becomes reddened/irritated stop using the CHG and inform your nurse when you arrive at Short Stay. Do not shave (including legs and underarms) for at least 48 hours prior to the first CHG shower.  You may shave your face/neck. Please follow these instructions carefully:  1.  Shower with CHG Soap the night before surgery and the  morning of Surgery.  2.  If you choose to wash your hair, wash your hair first as usual with your  normal  shampoo.  3.  After you shampoo, rinse your hair and body thoroughly to remove the  shampoo.                           4.  Use CHG as you would any other liquid soap.  You can apply chg directly  to the skin and wash                       Gently with a scrungie or clean washcloth.  5.  Apply the CHG Soap to your body ONLY FROM THE NECK DOWN.   Do not use on face/ open                           Wound or open sores. Avoid contact with eyes, ears mouth and genitals (private parts).                       Wash face,  Genitals (private parts) with your normal soap.             6.  Wash thoroughly, paying special attention to the area where your surgery  will be performed.  7.  Thoroughly rinse your body with warm water from the neck down.  8.  DO NOT shower/wash with your normal soap after using and rinsing off  the CHG Soap.                9.  Pat yourself dry with a clean towel.            10.  Wear clean pajamas.            11.  Place clean sheets on your bed the night of your first shower and do not  sleep with pets. Day of Surgery : Do not apply any lotions/deodorants the morning of surgery.  Please wear clean clothes to the hospital/surgery center.  FAILURE TO FOLLOW THESE INSTRUCTIONS MAY RESULT IN THE CANCELLATION OF YOUR SURGERY PATIENT SIGNATURE_________________________________  NURSE  SIGNATURE__________________________________  ________________________________________________________________________

## 2021-04-19 ENCOUNTER — Encounter (HOSPITAL_COMMUNITY)
Admission: RE | Admit: 2021-04-19 | Discharge: 2021-04-19 | Disposition: A | Discharge status: Home / Self Care | Payer: Medicare HMO | Source: Ambulatory Visit | Attending: Anesthesiology | Admitting: Anesthesiology

## 2021-04-19 DIAGNOSIS — Z789 Other specified health status: Secondary | ICD-10-CM

## 2021-04-19 DIAGNOSIS — E1165 Type 2 diabetes mellitus with hyperglycemia: Secondary | ICD-10-CM

## 2021-04-19 DIAGNOSIS — K529 Noninfective gastroenteritis and colitis, unspecified: Secondary | ICD-10-CM

## 2021-04-19 DIAGNOSIS — I10 Essential (primary) hypertension: Secondary | ICD-10-CM

## 2021-04-19 DIAGNOSIS — I119 Hypertensive heart disease without heart failure: Secondary | ICD-10-CM

## 2021-05-06 ENCOUNTER — Ambulatory Visit: Admit: 2021-05-06 | Payer: Medicare HMO | Admitting: Urology

## 2021-05-06 SURGERY — CYSTOURETEROSCOPY, WITH RETROGRADE PYELOGRAM AND STENT INSERTION
Anesthesia: General | Laterality: Right

## 2021-05-16 NOTE — Progress Notes (Addendum)
Cardiology Office Note   Date:  05/18/2021   ID:  Dean Cole, DOB 02/18/1951, MRN [id number]  PCP:  Dean Sacramento, MD  Cardiologist:   Dean Moores, MD   Chief Complaint  Patient presents with   Coronary Artery Disease      History of Present Illness: Dean Cole is a 71 y.o. male who presents for follow up of CAD.  He was seen previously by Dr. Mare Cole and Dean Cole.   In March of 2021 he was in the hospital with sepsis of unknown source.  He had no cardiac issues during that admission.   I did refer him to neurology for evaluation of balance and memory.  He was thought to have multifactorial etiologies including med side effects for his memory and balance issues that were notable at the last visit.      Since I last saw him he has had no new cardiovascular problems.  His biggest issue is cognition and gait.  He has had 2 falls in the last few days and actually saw his primary for this recently.  However, he has had no cardiac problems.  There has been no description of chest discomfort, neck or arm discomfort.  There has been no shortness of breath, PND or orthopnea.  There is been no palpitations, presyncope or syncope.  He has had no weight gain or edema.  He is actually lost about 10 pounds.    Past Medical History:  Diagnosis Date   Anxiety    Basal cell carcinoma    Cholecystitis    Controlled type 2 DM with peripheral circulatory disorder ([place]) 09/20/2014   L LAD, S Diag, S OM, S PL   Coronary artery disease    Depression    Headache(784.0)    HTN (hypertension)    Hx of skin cancer, basal cell    Hyperlipidemia    Hypothyroidism    Kidney stones    Melanoma ([place])    PAD (peripheral artery disease) ([place]) 09/20/2014   Prostate cancer ([place])    Restless leg syndrome    Sepsis ([place])     Past Surgical History:  Procedure Laterality Date   BASAL CELL CARCINOMA EXCISION  2000   Dr,Arkin    CARDIAC CATHETERIZATION  07/07/2011   CHOLECYSTECTOMY   09/14/2011   Procedure: LAPAROSCOPIC CHOLECYSTECTOMY WITH INTRAOPERATIVE CHOLANGIOGRAM;  Surgeon: Gwenyth Ober, MD;  Location: [facility];  Service: General;  Laterality: N/A;   CORONARY ARTERY BYPASS GRAFT  07/10/2011   Procedure: CORONARY ARTERY BYPASS GRAFTING (CABG);  Surgeon: Ivin Poot, MD;  Location: [facility];  Service: Open Heart Surgery;  Laterality: N/A;   EXCISIONAL HEMORRHOIDECTOMY  1997   HEMORROIDECTOMY  2000   LEFT HEART CATHETERIZATION WITH CORONARY ANGIOGRAM N/A 07/07/2011   Procedure: LEFT HEART CATHETERIZATION WITH CORONARY ANGIOGRAM;  Surgeon: Burnell Blanks, MD;  [facility] C[facility] LAB;  Service: Cardiovascular;  Laterality: N/A;     Current Outpatient Medications  Medication Sig Dispense Refill   clotrimazole (LOTRIMIN) 1 % external solution 1 application 2 (two) times daily as needed (skin irritation).     clotrimazole-betamethasone (LOTRISONE) cream Apply 1 application topically 2 (two) times daily as needed (for skin irritation). 45 g 1   diazepam (VALIUM) 10 MG tablet Take 10 mg by mouth 2 (two) times daily.     diphenoxylate-atropine (LOMOTIL) 2.5-0.025 MG tablet Take one tablet before meals as needed for diarrhea     donepezil (ARICEPT) 10 MG tablet TAKE  1 TABLET BY MOUTH EVERYDAY AT BEDTIME 90 tablet 1   gabapentin (NEURONTIN) 100 MG capsule TAKE 1 CAPSULE BY MOUTH THREE TIMES A DAY 90 capsule 3   imipramine (TOFRANIL) 50 MG tablet Take 100 mg by mouth at bedtime.     levothyroxine (SYNTHROID) 175 MCG tablet Take 175 mcg by mouth daily before breakfast.      memantine (NAMENDA XR) 28 MG CP24 24 hr capsule Take 28 mg by mouth in the morning.     metFORMIN (GLUCOPHAGE-XR) 500 MG 24 hr tablet Take 500 mg by mouth daily with breakfast.     metoprolol tartrate (LOPRESSOR) 25 MG tablet Take 25 mg by mouth 2 (two) times daily.     mupirocin ointment (BACTROBAN) 2 % Apply 1 application topically 2 (two) times daily. (Patient taking differently: Apply 1 application  topically 2 (two) times daily as needed (wound care.).) 30 g 2   NITROSTAT 0.4 MG SL tablet Place 0.4 mg under the tongue every 5 (five) minutes as needed for chest pain. Reported on 10/26/2015     pentazocine-naloxone (TALWIN NX) 50-0.5 MG tablet Take 1 tablet by mouth in the morning and at bedtime.     simvastatin (ZOCOR) 20 MG tablet TAKE ONE TABLET BY MOUTH ONCE DAILY FOR CHOLESTEROL (Patient taking differently: Take 20 mg by mouth every evening. Take one tablet by mouth once daily for cholesterol) 90 tablet 1   tamsulosin (FLOMAX) 0.4 MG CAPS capsule Take 0.4 mg by mouth 2 (two) times daily.     triamcinolone (KENALOG) 0.025 % ointment APPLY TO AFFECTED AREA TWICE A DAY (Patient taking differently: 1 application 2 (two) times daily as needed (skin irritation/rash.).) 30 g 0   aspirin EC 81 MG tablet Take 1 tablet (81 mg total) by mouth daily. Swallow whole. (Patient not taking: Reported on 05/18/2021) 90 tablet 3   cephALEXin (KEFLEX) 500 MG capsule Take 1 capsule (500 mg total) by mouth 3 (three) times daily. (Patient not taking: Reported on 04/13/2021) 30 capsule 0   phenazopyridine (PYRIDIUM) 95 MG tablet Take 95 mg by mouth 3 (three) times daily as needed (urinary pain relief). (Patient not taking: Reported on 05/18/2021)     No current facility-administered medications for this visit.    Allergies:   Adhesive [tape]   ROS:  Please see the history of present illness.   Otherwise, review of systems are positive for none.   All other systems are reviewed and negative.    PHYSICAL EXAM: VS:  BP 138/82 (BP Location: Left Arm, Patient Position: Sitting, Cuff Size: Normal)    Pulse 98    Ht 5\' 11"  (1.803 m)    Wt 180 lb 6.4 oz (81.8 kg)    SpO2 98%    BMI 25.16 kg/m  , BMI Body mass index is 25.16 kg/m. GEN:  No distress NECK:  No jugular venous distention at 90 degrees, waveform within normal limits, carotid upstroke brisk and symmetric, no bruits, no thyromegaly LYMPHATICS:  No cervical  adenopathy LUNGS:  Clear to auscultation bilaterally BACK:  No CVA tenderness CHEST:  Well healed sternotomy scar. HEART:  S1 and S2 within normal limits, no S3, no S4, no clicks, no rubs, no murmurs ABD:  Positive bowel sounds normal in frequency in pitch, no bruits, no rebound, no guarding, unable to assess midline mass or bruit with the patient seated. EXT:  2 plus pulses throughout, no edema, no cyanosis no clubbing  EKG:  EKG is  ordered today. The ekg  ordered  demonstrates sinus tachycardia, rate 98, nonspecific diffuse T wave changes. No change from previous.    Recent Labs: No results found for requested labs within last 8760 hours.    Lipid Panel    Component Value Date/Time   CHOL 166 04/17/2016 0928   CHOL 128 10/22/2015 0816   TRIG 242 (H) 04/17/2016 0928   HDL 34 (L) 04/17/2016 0928   HDL 30 (L) 10/22/2015 0816   CHOLHDL 4.9 04/17/2016 0928   VLDL 48 (H) 04/17/2016 0928   LDLCALC 84 04/17/2016 0928   LDLCALC 48 10/22/2015 0816      Wt Readings from Last 3 Encounters:  05/18/21 180 lb 6.4 oz (81.8 kg)  08/20/20 160 lb (72.6 kg)  05/26/20 174 lb (78.9 kg)      Other studies Reviewed: Additional studies/ records that were reviewed today include: Labs Review of the above records demonstrates:  Please see elsewhere in the note.     ASSESSMENT AND PLAN:  CAD:   The patient has no new sypmtoms.  No further cardiovascular testing is indicated.  We will continue with aggressive risk reduction and meds as listed.  DYSLIPIDEMIA:   LDL was 48.  HDL 30.  No change I therapy.   HTN: Blood pressures was well controlled.  No change in therapy.   DM:  A1c was 6.7.  No change in therapy.    Current medicines are reviewed at length with the patient today.  The patient does not have concerns regarding medicines.  The following changes have been made:  None  Labs/ tests ordered today include:  None  Orders Placed This Encounter  Procedures   EKG 12-Lead      Disposition:   FU with me in 12 months.     Signed, Minus Breeding, MD  05/18/2021 11:13 AM    [place]

## 2021-05-17 ENCOUNTER — Other Ambulatory Visit: Payer: Self-pay

## 2021-05-17 ENCOUNTER — Ambulatory Visit: Payer: Medicare HMO | Admitting: Podiatry

## 2021-05-17 DIAGNOSIS — E1149 Type 2 diabetes mellitus with other diabetic neurological complication: Secondary | ICD-10-CM

## 2021-05-17 DIAGNOSIS — B351 Tinea unguium: Secondary | ICD-10-CM

## 2021-05-17 DIAGNOSIS — M79674 Pain in right toe(s): Secondary | ICD-10-CM | POA: Diagnosis not present

## 2021-05-17 DIAGNOSIS — M79675 Pain in left toe(s): Secondary | ICD-10-CM | POA: Diagnosis not present

## 2021-05-18 ENCOUNTER — Encounter: Payer: Self-pay | Admitting: Cardiology

## 2021-05-18 ENCOUNTER — Other Ambulatory Visit: Payer: Self-pay

## 2021-05-18 ENCOUNTER — Ambulatory Visit (INDEPENDENT_AMBULATORY_CARE_PROVIDER_SITE_OTHER): Payer: Medicare HMO | Admitting: Cardiology

## 2021-05-18 VITALS — BP 138/82 | HR 98 | Ht 71.0 in | Wt 180.4 lb

## 2021-05-18 DIAGNOSIS — I10 Essential (primary) hypertension: Secondary | ICD-10-CM | POA: Diagnosis not present

## 2021-05-18 DIAGNOSIS — E785 Hyperlipidemia, unspecified: Secondary | ICD-10-CM | POA: Diagnosis not present

## 2021-05-18 DIAGNOSIS — I2510 Atherosclerotic heart disease of native coronary artery without angina pectoris: Secondary | ICD-10-CM | POA: Diagnosis not present

## 2021-05-18 NOTE — Patient Instructions (Signed)
Medication Instructions:  The current medical regimen is effective;  continue present plan and medications.  *If you need a refill on your cardiac medications before your next appointment, please call your pharmacy*  Follow-Up: At [facility], you and your health needs are our priority.  As part of our continuing mission to provide you with exceptional heart care, we have created designated Provider Care Teams.  These Care Teams include your primary Cardiologist (physician) and Advanced Practice Providers (APPs -  Physician Assistants and Nurse Practitioners) who all work together to provide you with the care you need, when you need it.  We recommend signing up for the patient portal called "MyChart".  Sign up information is provided on this After Visit Summary.  MyChart is used to connect with patients for Virtual Visits (Telemedicine).  Patients are able to view lab/test results, encounter notes, upcoming appointments, etc.  Non-urgent messages can be sent to your provider as well.   To learn more about what you can do with MyChart, go to [email or web address].    Your next appointment:   1 year(s)  The format for your next appointment:   In Person  Provider:   James Hochrein, MD   Thank you for choosing [facility]!!    

## 2021-05-21 NOTE — Progress Notes (Signed)
Subjective: 71 year old male presents the office today with his wife for concerns of thick, elongated toes that he cannot trim himself to both feet.  His wife states he has been falling more recently.  He schedules he is primary care doctor later today for this.  No recent injuries to his lower extremities that he reports.  Denies any open sores or any swelling or redness.  Objective: AAO x3 DP/PT pulses palpable bilaterally, CRT less than 3 seconds Nails are hypertrophic, dystrophic, brittle, discolored, elongated 10.  Small nail present on bilateral hallux toenails.  Tenderness nails 1-5 bilaterally. No open lesions or pre-ulcerative lesions are identified today. No pain with calf compression, swelling, warmth, erythema  Assessment: 71 year old male symptomatic onychomycosis  Plan: -All treatment options discussed with the patient including all alternatives, risks, complications. -Sharply debrided the nails x10 without any complications or bleeding. -Continue moisturizer as needed. -Daily foot inspection  Trula Slade DPM

## 2021-06-08 ENCOUNTER — Other Ambulatory Visit: Payer: Self-pay | Admitting: Podiatry

## 2021-06-08 NOTE — Telephone Encounter (Signed)
Please advise 

## 2021-07-19 ENCOUNTER — Other Ambulatory Visit: Payer: Self-pay

## 2021-07-19 ENCOUNTER — Ambulatory Visit: Payer: Medicare HMO | Admitting: Podiatry

## 2021-07-19 DIAGNOSIS — E1149 Type 2 diabetes mellitus with other diabetic neurological complication: Secondary | ICD-10-CM

## 2021-07-19 DIAGNOSIS — M79675 Pain in left toe(s): Secondary | ICD-10-CM

## 2021-07-19 DIAGNOSIS — B351 Tinea unguium: Secondary | ICD-10-CM | POA: Diagnosis not present

## 2021-07-19 DIAGNOSIS — M79674 Pain in right toe(s): Secondary | ICD-10-CM

## 2021-07-19 DIAGNOSIS — B353 Tinea pedis: Secondary | ICD-10-CM

## 2021-07-19 MED ORDER — CLOTRIMAZOLE-BETAMETHASONE 1-0.05 % EX CREA: 1.0000 "application " | TOPICAL_CREAM | Freq: Two times a day (BID) | CUTANEOUS | 0 refills | Status: DC

## 2021-07-21 NOTE — Progress Notes (Signed)
Subjective: ?71 year old male presents the office today with his wife for concerns of thick, elongated toes that he cannot trim himself to both feet.  Denies any open sores but there is a rash on the dorsal lateral aspect the right foot.  They have been using a steroid cream which has not been helping.  Denies any openings, swelling or redness or any drainage otherwise.  No fevers or chills. ? ?Objective: ?AAO x3 ?DP/PT pulses palpable bilaterally, CRT less than 3 seconds ?Nails are hypertrophic, dystrophic, brittle, discolored, elongated ?10.  Small nail present on bilateral hallux toenails.  Tenderness nails 1-5 bilaterally. No open lesions or pre-ulcerative lesions are identified today. ?On the dorsal lateral aspect of the foot just proximal to the fourth, fifth metatarsal phalangeal joint dry, erythematous, scaly rash.  There is no skin breakdown, drainage or pus or any signs of infection.  There is no pustules present.  No blistering present. ?No pain with calf compression, swelling, warmth, erythema ? ?Assessment: ?71 year old male symptomatic onychomycosis, skin rash ? ?Plan: ?-All treatment options discussed with the patient including all alternatives, risks, complications. ?-Sharply debrided the nails x10 without any complications or bleeding. ?-He has been using triamcinolone cream without significant improvement.  Possible fungal element.  I ordered Lotrisone cream for him to apply. ?-Continue moisturizer as needed. ?-Daily foot inspection ? ?Trula Slade DPM ?

## 2021-08-23 ENCOUNTER — Other Ambulatory Visit: Payer: Self-pay | Admitting: Podiatry

## 2021-09-20 ENCOUNTER — Ambulatory Visit (INDEPENDENT_AMBULATORY_CARE_PROVIDER_SITE_OTHER): Payer: Medicare HMO | Admitting: Podiatry

## 2021-09-20 DIAGNOSIS — M79674 Pain in right toe(s): Secondary | ICD-10-CM

## 2021-09-20 DIAGNOSIS — M79675 Pain in left toe(s): Secondary | ICD-10-CM

## 2021-09-20 DIAGNOSIS — E1149 Type 2 diabetes mellitus with other diabetic neurological complication: Secondary | ICD-10-CM

## 2021-09-20 DIAGNOSIS — B351 Tinea unguium: Secondary | ICD-10-CM

## 2021-09-20 DIAGNOSIS — R21 Rash and other nonspecific skin eruption: Secondary | ICD-10-CM

## 2021-09-20 DIAGNOSIS — L03115 Cellulitis of right lower limb: Secondary | ICD-10-CM

## 2021-09-20 MED ORDER — DOXYCYCLINE HYCLATE 100 MG PO TABS: 100.0000 mg | ORAL_TABLET | Freq: Two times a day (BID) | ORAL | 0 refills | Status: DC

## 2021-09-20 NOTE — Patient Instructions (Signed)
If the redness or the rash does not go away with the antibiotic, please let me know. If there is any worsening please call us at [phone] or go to the ER.

## 2021-09-28 NOTE — Progress Notes (Signed)
Subjective: 71 year old male presents the office today with his wife for concerns of thick, elongated toes that he cannot trim himself to both feet.  He gets a rash in the top of his foot he states the only way it goes away so with antibiotics.  No open lesions so they report no drainage.    Objective: AAO x3 DP/PT pulses palpable bilaterally, CRT less than 3 seconds Nails are hypertrophic, dystrophic, brittle, discolored, elongated 10.  Small nail present on bilateral hallux toenails.  Tenderness nails 1-5 bilaterally. No open lesions or pre-ulcerative lesions are identified today. On the dorsal aspect the right foot there is a flat erythematous rash present any open lesions or drainage.  No increased temperature. No pain with calf compression, swelling, warmth, erythema  Assessment: 71 year old male symptomatic onychomycosis, skin rash  Plan: -All treatment options discussed with the patient including all alternatives, risks, complications. -Sharply debrided the nails x10 without any complications or bleeding. -Steroid cream has not been helpful.  Prescribed doxycycline.  Any worsening let me know if does not resolve in the next couple of weeks. -Continue moisturizer as needed. -Daily foot inspection  Trula Slade DPM

## 2021-10-12 ENCOUNTER — Other Ambulatory Visit: Payer: Self-pay | Admitting: Podiatry

## 2021-11-16 ENCOUNTER — Other Ambulatory Visit: Payer: Self-pay | Admitting: Podiatry

## 2021-11-16 NOTE — Telephone Encounter (Signed)
Please advise 

## 2021-11-24 ENCOUNTER — Ambulatory Visit (INDEPENDENT_AMBULATORY_CARE_PROVIDER_SITE_OTHER): Payer: Medicare HMO | Admitting: Podiatry

## 2021-11-24 DIAGNOSIS — M79674 Pain in right toe(s): Secondary | ICD-10-CM

## 2021-11-24 DIAGNOSIS — T148XXA Other injury of unspecified body region, initial encounter: Secondary | ICD-10-CM | POA: Diagnosis not present

## 2021-11-24 DIAGNOSIS — B351 Tinea unguium: Secondary | ICD-10-CM | POA: Diagnosis not present

## 2021-11-24 DIAGNOSIS — E1149 Type 2 diabetes mellitus with other diabetic neurological complication: Secondary | ICD-10-CM | POA: Diagnosis not present

## 2021-11-24 DIAGNOSIS — M79675 Pain in left toe(s): Secondary | ICD-10-CM | POA: Diagnosis not present

## 2021-11-24 MED ORDER — MUPIROCIN 2 % EX OINT: 1.0000 | TOPICAL_OINTMENT | Freq: Two times a day (BID) | CUTANEOUS | 2 refills | Status: DC

## 2021-11-24 NOTE — Progress Notes (Signed)
Subjective: 71 year old male presents the office today with his wife for concerns of thick, elongated toes that he cannot trim himself to both feet.  Developed a blister on the top of his left foot near the fifth neuritis of the report.  No recent treatment.  No open lesions otherwise.  No other concerns.   Objective: AAO x3 DP/PT pulses palpable bilaterally, CRT less than 3 seconds Nails are hypertrophic, dystrophic, brittle, discolored, elongated 10.  Small nail present on bilateral hallux toenails.  Tenderness nails 1-5 bilaterally. No open lesions or pre-ulcerative lesions are identified today. On the dorsal aspect of the left fourth interspace there is a dried old blister without any erythema, change of pus or signs of infection.  There is also a scab present on the tip of the right hallux.  No edema, erythema.  There is no drainage or pus.  No fluctuation or crepitation.  Noted. No pain with calf compression, swelling, warmth, erythema  Assessment: 71 year old male symptomatic onychomycosis, blister without signs of infection  Plan: -All treatment options discussed with the patient including all alternatives, risks, complications. -Sharply debrided the nails x10 without any complications or bleeding. -Mupirocin ointment to both areas hallux as well as areas of blister.  Monitor for any signs or symptoms of infection. -Continue moisturizer as needed. -Daily foot inspection  Return in about 3 months (around 02/24/2022).

## 2021-11-24 NOTE — Patient Instructions (Signed)
You can try Theraworx  cream on the feet.

## 2021-11-30 ENCOUNTER — Telehealth: Payer: Self-pay | Admitting: *Deleted

## 2021-11-30 NOTE — Telephone Encounter (Signed)
Due to the patient living over a hour and a half away from the office patient will need to be schedule to be seen tomorrow. Please call to get patient scheduled with Dr. Sherryle Lis tomorrow at 74am or 115pm as soon as possible. Thank you

## 2021-11-30 NOTE — Telephone Encounter (Signed)
Patient's wife is calling because his left great toe has gotten worse, is swelling more, draining and white on bottom. Spoke with wife and will get him in as soon as possible even if it is with another physician.

## 2021-11-30 NOTE — Telephone Encounter (Signed)
Patient is seeing Dr Sherryle Lis tomorrow 08/03 '@9'$ :37.

## 2021-12-01 ENCOUNTER — Ambulatory Visit: Payer: Medicare HMO | Admitting: Podiatry

## 2021-12-01 DIAGNOSIS — L97511 Non-pressure chronic ulcer of other part of right foot limited to breakdown of skin: Secondary | ICD-10-CM

## 2021-12-01 MED ORDER — CEPHALEXIN 500 MG PO CAPS: 500.0000 mg | ORAL_CAPSULE | Freq: Three times a day (TID) | ORAL | 0 refills | Status: DC

## 2021-12-04 ENCOUNTER — Other Ambulatory Visit: Payer: Self-pay

## 2021-12-04 ENCOUNTER — Emergency Department (HOSPITAL_BASED_OUTPATIENT_CLINIC_OR_DEPARTMENT_OTHER)
Admission: EM | Admit: 2021-12-04 | Discharge: 2021-12-04 | Disposition: A | Discharge status: Home / Self Care | Payer: Medicare HMO | Attending: Emergency Medicine | Admitting: Emergency Medicine

## 2021-12-04 ENCOUNTER — Emergency Department (HOSPITAL_BASED_OUTPATIENT_CLINIC_OR_DEPARTMENT_OTHER): Payer: Medicare HMO

## 2021-12-04 ENCOUNTER — Encounter (HOSPITAL_BASED_OUTPATIENT_CLINIC_OR_DEPARTMENT_OTHER): Payer: Self-pay | Admitting: Emergency Medicine

## 2021-12-04 DIAGNOSIS — W28XXXA Contact with powered lawn mower, initial encounter: Secondary | ICD-10-CM | POA: Insufficient documentation

## 2021-12-04 DIAGNOSIS — Z7982 Long term (current) use of aspirin: Secondary | ICD-10-CM | POA: Insufficient documentation

## 2021-12-04 DIAGNOSIS — M7989 Other specified soft tissue disorders: Secondary | ICD-10-CM | POA: Diagnosis not present

## 2021-12-04 DIAGNOSIS — E1140 Type 2 diabetes mellitus with diabetic neuropathy, unspecified: Secondary | ICD-10-CM | POA: Insufficient documentation

## 2021-12-04 DIAGNOSIS — S99921A Unspecified injury of right foot, initial encounter: Secondary | ICD-10-CM | POA: Insufficient documentation

## 2021-12-04 DIAGNOSIS — Z7984 Long term (current) use of oral hypoglycemic drugs: Secondary | ICD-10-CM | POA: Insufficient documentation

## 2021-12-04 NOTE — ED Provider Notes (Signed)
[facility] EMERGENCY DEPARTMENT Provider Note   CSN: [id number] Arrival date & time: 12/04/21  3474     History  Chief Complaint  Patient presents with   Foot Injury    Dean Cole is a 71 y.o. male with a past medical history of type 2 diabetes and peripheral neuropathy presenting today after an injury on Friday.  Patient reports that he got his right foot stuck under a lawnmower.  No lacerations sustained.  Has noted its been swollen and harder to bear weight on.  He has been taking Tylenol, icing and elevating it.  Also already on an antibiotic for cellulitis in his legs that he started earlier this week via PCP.   Foot Injury      Home Medications Prior to Admission medications   Medication Sig Start Date End Date Taking? Authorizing Provider  aspirin EC 81 MG tablet Take 1 tablet (81 mg total) by mouth daily. Swallow whole. Patient not taking: Reported on 05/18/2021 05/26/20   Minus Breeding, MD  cephALEXin (KEFLEX) 500 MG capsule Take 1 capsule (500 mg total) by mouth 3 (three) times daily. 12/01/21   McDonald, Stephan Minister, DPM  clotrimazole (LOTRIMIN) 1 % external solution 1 application 2 (two) times daily as needed (skin irritation). 09/09/20   [provider]  clotrimazole-betamethasone (LOTRISONE) cream APPLY 1 APPLICATION. TOPICALLY 2 (TWO) TIMES DAILY. 10/14/21   Trula Slade, DPM  diazepam (VALIUM) 10 MG tablet Take 10 mg by mouth 2 (two) times daily. 09/22/19   [provider]  diphenoxylate-atropine (LOMOTIL) 2.5-0.025 MG tablet Take one tablet before meals as needed for diarrhea 12/07/20   [provider]  donepezil (ARICEPT) 10 MG tablet TAKE 1 TABLET BY MOUTH EVERYDAY AT BEDTIME 01/29/20   Jaffe, Adam R, DO  doxycycline (VIBRA-TABS) 100 MG tablet Take 1 tablet (100 mg total) by mouth 2 (two) times daily. 09/20/21   Trula Slade, DPM  gabapentin (NEURONTIN) 100 MG capsule TAKE 1 CAPSULE BY MOUTH THREE TIMES A DAY 11/16/21    Trula Slade, DPM  imipramine (TOFRANIL) 50 MG tablet Take 100 mg by mouth at bedtime.    [provider]  levothyroxine (SYNTHROID) 175 MCG tablet Take 175 mcg by mouth daily before breakfast.  07/08/19   [provider]  memantine (NAMENDA XR) 28 MG CP24 24 hr capsule Take 28 mg by mouth in the morning.    [provider]  metFORMIN (GLUCOPHAGE-XR) 500 MG 24 hr tablet Take 500 mg by mouth daily with breakfast.    [provider]  metoprolol tartrate (LOPRESSOR) 25 MG tablet Take 25 mg by mouth 2 (two) times daily.    [provider]  mupirocin ointment (BACTROBAN) 2 % Apply 1 Application topically 2 (two) times daily. 11/24/21   Trula Slade, DPM  NITROSTAT 0.4 MG SL tablet Place 0.4 mg under the tongue every 5 (five) minutes as needed for chest pain. Reported on 10/26/2015 07/06/11   [provider]  pentazocine-naloxone (TALWIN NX) 50-0.5 MG tablet Take 1 tablet by mouth in the morning and at bedtime.    [provider]  phenazopyridine (PYRIDIUM) 95 MG tablet Take 95 mg by mouth 3 (three) times daily as needed (urinary pain relief). Patient not taking: Reported on 05/18/2021    [provider]  simvastatin (ZOCOR) 20 MG tablet TAKE ONE TABLET BY MOUTH ONCE DAILY FOR CHOLESTEROL Patient taking differently: Take 20 mg by mouth every evening. Take one tablet by mouth  once daily for cholesterol 10/16/16   Reed, Tiffany L, DO  tamsulosin (FLOMAX) 0.4 MG CAPS capsule Take 0.4 mg by mouth 2 (two) times daily.    [provider]  triamcinolone (KENALOG) 0.025 % ointment APPLY TO AFFECTED AREA TWICE A DAY 10/14/21   Trula Slade, DPM      Allergies    Adhesive [tape]    Review of Systems   Review of Systems  Physical Exam Updated Vital Signs BP (!) 150/67 (BP Location: Left Arm)   Pulse 62   Temp 98.2 F (36.8 C) (Oral)   Resp 16   Ht '5\' 10"'$  (1.778 m)   Wt 77.6 kg   SpO2 99%   BMI 24.54 kg/m   Physical Exam Vitals and nursing note reviewed.  Constitutional:      Appearance: Normal appearance.  HENT:     Head: Normocephalic and atraumatic.  Eyes:     General: No scleral icterus.    Conjunctiva/sclera: Conjunctivae normal.  Pulmonary:     Effort: Pulmonary effort is normal. No respiratory distress.  Musculoskeletal:     Comments: Full range of motion of right ankle.  Normal strength with plantarflexion.  Strong DP pulse.  No lacerations however moderate swelling over the dorsal surface of the foot.  Skin:    Findings: No rash.  Neurological:     Mental Status: He is alert.  Psychiatric:        Mood and Affect: Mood normal.     ED Results / Procedures / Treatments   Labs (all labs ordered are listed, but only abnormal results are displayed) Labs Reviewed - No data to display  EKG None  Radiology DG Foot Complete Right  Result Date: 12/04/2021 CLINICAL DATA:  Right foot injury by lawnmower 2 days ago EXAM: RIGHT FOOT COMPLETE - 3+ VIEW COMPARISON:  06/08/2006 FINDINGS: Limited by osteopenia.  No acute fracture or dislocation. IMPRESSION: Negative. Electronically Signed   By: Jorje Guild M.D.   On: 12/04/2021 10:17    Procedures Procedures    Medications Ordered in ED Medications - No data to display  ED Course/ Medical Decision Making/ A&P                           Medical Decision Making Amount and/or Complexity of Data Reviewed Radiology: ordered.   71 year old male presenting after a foot injury that occurred on Friday.  Swelling noted on physical exam however no other signs of injury.  Neurovascularly intact.  Imaging: X-ray viewed and interpreted by me.  I agree with the radiologist that there are no signs of fracture or other abnormality.  Treatment: Declined NSAIDs and Tylenol  MDM/disposition: Patient has no sign of lacerations requiring tetanus shot today.  He will be treated with a lace up ankle brace.  He will continue RICE therapy at  home and follow-up with his PCP at the end of the week if things are not getting better.  He and his wife are agreeable  Final Clinical Impression(s) / ED Diagnoses Final diagnoses:  Injury of right foot, initial encounter    Rx / DC Orders ED Discharge Orders     None      Results and diagnoses were explained to the patient. Return precautions discussed in full. Patient had no additional questions and expressed complete understanding.   This chart was dictated using voice recognition software.  Despite best efforts to proofread,  errors can occur which can  change the documentation meaning.    Rhae Hammock, PA-C 12/04/21 1027    Hayden Rasmussen, MD 12/04/21 1721

## 2021-12-04 NOTE — ED Triage Notes (Signed)
Pt arrive pov, slow gait, c/o RT foot injury by lawnmower 2 days pta. Pt denies laceration

## 2021-12-04 NOTE — Progress Notes (Signed)
  Subjective:  Patient ID: MANCE VALLEJO, male    DOB: Jul 23, 1950,  MRN: [id number]  Chief Complaint  Patient presents with   Foot Ulcer       left great toe has gotten worse, is swelling more, draining and white on bottom.    71 y.o. male presents with the above complaint. History confirmed with patient.  They were recently here they were concerned about the swelling and thought about be draining again  Objective:  Physical Exam: warm, good capillary refill, no trophic changes or ulcerative lesions, normal DP and PT pulses, normal sensory exam, and right hallux tip small 4 mm partial-thickness ulceration no signs of infection      Assessment:   1. Toe ulcer, right, limited to breakdown of skin [facility])      Plan:  Patient was evaluated and treated and all questions answered.  Skin also appears to be relatively minor and they are concerned about the possibility of an infection and requested antibiotics.  I did prescribe Keflex as a precaution.  I recommend they continue to use the mupirocin ointment.  I will see him back in 1 month for follow-up.  Expect this to heal uneventfully  Return in about 4 weeks (around 12/29/2021) for wound check.

## 2021-12-04 NOTE — Discharge Instructions (Signed)
Follow-up with your PCP at the end of the week if your symptoms are not improving.  Continue to use Tylenol, ice and elevation for your treatment.  Use the brace anytime you are walking.  It was a pleasure to meet you and I hope that you feel better.

## 2022-01-10 ENCOUNTER — Ambulatory Visit: Payer: Medicare HMO | Admitting: Podiatry

## 2022-01-10 DIAGNOSIS — L97511 Non-pressure chronic ulcer of other part of right foot limited to breakdown of skin: Secondary | ICD-10-CM

## 2022-01-10 NOTE — Progress Notes (Signed)
  Subjective:  Patient ID: Dean Cole, male    DOB: 1950/08/28,  MRN: [id number]  Chief Complaint  Patient presents with   Wound Check    4 week follow up wound check- toe ulcer-right foot- wound is closed.   Swelling to right foot- has used compression socks- elevating foot. Patient is taking a diuretic every other day.    Numbness    71 y.o. male presents with the above complaint. History confirmed with patient.  Doing well  Objective:  Physical Exam: warm, good capillary refill, no trophic changes or ulcerative lesions, normal DP and PT pulses, normal sensory exam, and right hallux wound is healed      Assessment:   1. Toe ulcer, right, limited to breakdown of skin [facility])      Plan:  Patient was evaluated and treated and all questions answered.  No current ulceration.  Do not see any recurrence.  We discussed the possibility of this.  Do not see any indication for further antibiotics.  Return as needed  Return if symptoms worsen or fail to improve.

## 2022-02-10 ENCOUNTER — Ambulatory Visit: Payer: Medicare HMO | Admitting: Podiatry

## 2022-02-10 DIAGNOSIS — B351 Tinea unguium: Secondary | ICD-10-CM

## 2022-02-10 DIAGNOSIS — M79674 Pain in right toe(s): Secondary | ICD-10-CM | POA: Diagnosis not present

## 2022-02-10 DIAGNOSIS — M79675 Pain in left toe(s): Secondary | ICD-10-CM | POA: Diagnosis not present

## 2022-02-10 MED ORDER — AMMONIUM LACTATE 12 % EX LOTN: 1.0000 | TOPICAL_LOTION | CUTANEOUS | 0 refills | Status: DC | PRN

## 2022-02-11 NOTE — Progress Notes (Signed)
Subjective:  71 year old male presents the office today with his wife for concerns of thick, elongated toes that he cannot trim himself to both feet.  Previously had a lesion on the big toe which has resolved.  Objective: AAO x3 DP/PT pulses palpable bilaterally, CRT less than 3 seconds Nails are hypertrophic, dystrophic, brittle, discolored, elongated 10.  Small nail present on bilateral hallux toenails.  Tenderness nails 1-5 bilaterally. No open lesions or pre-ulcerative lesions are identified today. On the dorsal aspect of the left fourth toe appears to be an old blister with no drainage or fluid present within the there is no fluctuance or crepitation.  No signs of infection.  There is no drainage or pus.  No fluctuation or crepitation.   Dry skin present. No pain with calf compression, swelling, warmth, erythema  Assessment: 71 year old male symptomatic onychomycosis, old blister  Plan: -All treatment options discussed with the patient including all alternatives, risks, complications. -Sharply debrided the nails x10 without any complications or bleeding. -Possible right fourth toe.  Monitoring signs or symptoms of infection.  Offloading. -Moisturizer for dry skin.  Prescribed AmLactin. -Continue moisturizer as needed. -Daily foot inspection  Return in about 9 weeks (around 04/14/2022).

## 2022-02-27 ENCOUNTER — Ambulatory Visit: Payer: Medicare HMO | Admitting: Podiatry

## 2022-04-04 ENCOUNTER — Other Ambulatory Visit: Payer: Self-pay | Admitting: Podiatry

## 2022-04-14 ENCOUNTER — Ambulatory Visit: Payer: Medicare HMO | Admitting: Podiatry

## 2022-04-14 VITALS — BP 128/63

## 2022-04-14 DIAGNOSIS — M79675 Pain in left toe(s): Secondary | ICD-10-CM | POA: Diagnosis not present

## 2022-04-14 DIAGNOSIS — M79674 Pain in right toe(s): Secondary | ICD-10-CM

## 2022-04-14 DIAGNOSIS — E1149 Type 2 diabetes mellitus with other diabetic neurological complication: Secondary | ICD-10-CM | POA: Diagnosis not present

## 2022-04-14 DIAGNOSIS — B351 Tinea unguium: Secondary | ICD-10-CM | POA: Diagnosis not present

## 2022-04-16 NOTE — Progress Notes (Signed)
Subjective: Chief Complaint  Patient presents with   Diabetes    Diabetic A1c- 7.0 BG- not taking, Nail trim     71 year old male presents the office today with his wife for concerns of thick, elongated toes that he cannot trim himself to both feet.  No open lesions.  Objective: AAO x3 DP/PT pulses palpable bilaterally, CRT less than 3 seconds Nails are hypertrophic, dystrophic, brittle, discolored, elongated 10.  Small nail present on bilateral hallux toenails.  Tenderness nails 1-5 bilaterally. No open lesions or pre-ulcerative lesions are identified today. No open lesions. Dry skin present. No pain with calf compression, swelling, warmth, erythema  Assessment: 71 year old male symptomatic onychomycosis  Plan: -All treatment options discussed with the patient including all alternatives, risks, complications. -Sharply debrided the nails x10 without any complications or bleeding. -Moisturizer for dry skin.  Continue AmLactin. -Continue moisturizer as needed. -Daily foot inspection  Return in about 2 months (around 06/14/2022).  Trula Slade DPM

## 2022-05-05 ENCOUNTER — Other Ambulatory Visit: Payer: Self-pay | Admitting: Podiatry

## 2022-06-16 ENCOUNTER — Ambulatory Visit: Payer: Medicare HMO | Admitting: Podiatry

## 2022-06-22 ENCOUNTER — Ambulatory Visit: Payer: Medicare HMO | Admitting: Podiatry

## 2022-06-22 VITALS — BP 133/80 | HR 59

## 2022-06-22 DIAGNOSIS — M79675 Pain in left toe(s): Secondary | ICD-10-CM | POA: Diagnosis not present

## 2022-06-22 DIAGNOSIS — E1149 Type 2 diabetes mellitus with other diabetic neurological complication: Secondary | ICD-10-CM | POA: Diagnosis not present

## 2022-06-22 DIAGNOSIS — M79674 Pain in right toe(s): Secondary | ICD-10-CM

## 2022-06-22 DIAGNOSIS — B351 Tinea unguium: Secondary | ICD-10-CM

## 2022-06-24 NOTE — Progress Notes (Signed)
Subjective: Chief Complaint  Patient presents with   Diabetes    Diabetic foot care, A1c- 7.0 BG-  not taking, Nail trim     72 year old male presents the office today with his wife for concerns of thick, elongated toes that he cannot trim himself to both feet.  No open lesions. No recent injuries to the feet.   Objective: AAO x3 DP/PT pulses palpable bilaterally, CRT less than 3 seconds Nails are hypertrophic, dystrophic, brittle, discolored, elongated 10.  Small nail present on bilateral hallux toenails.  Tenderness nails 1-5 bilaterally. No open lesions or pre-ulcerative lesions are identified today. No open lesions. Dry skin present. No pain with calf compression, swelling, warmth, erythema  Assessment: 72 year old male symptomatic onychomycosis  Plan: -All treatment options discussed with the patient including all alternatives, risks, complications. -Sharply debrided the nails x10 without any complications or bleeding. -Continue moisturizer as needed. -Daily foot inspection  Return in about 3 months (around 09/20/2022).  Trula Slade DPM

## 2022-06-25 NOTE — Progress Notes (Unsigned)
Cardiology Office Note   Date:  06/28/2022   ID:  Dean Cole, Dean Cole 22-Oct-1950, MRN [id number]  PCP:  [name], MD  Cardiologist:   Mertie Moores, MD   Chief Complaint  Patient presents with   Coronary Artery Disease     History of Present Illness: Dean Cole is a 72 y.o. male who presents for follow up of CAD.  He was seen previously by Dr. Mare Ferrari and Wynonia Lawman.   In March of 2021 he was in the hospital with sepsis of unknown source.  He had no cardiac issues during that admission.   I did refer him to neurology for evaluation of balance and memory.  He was thought to have multifactorial etiologies including med side effects for his memory and balance issues that were notable at the last visit.      Since I last saw him he had problems with diarrhea and has been taken off of his metformin.  He has had a recent CT at [facility] and I was able to review these records.  This was apparently to evaluate some GI problems.  There was some chronic inflammation.  Some fluid in the area of his cholecystectomy.  There was a question of internal carotid artery possible pseudoaneurysm and he is getting further imaging of this.  From a cardiac standpoint he is really not describing any symptoms.  Memory has been a big issue.  He had just progressive frailty and walks with a walker.   Past Medical History:  Diagnosis Date   Anxiety    Basal cell carcinoma    Cholecystitis    Controlled type 2 DM with peripheral circulatory disorder ([place]) 09/20/2014   L LAD, S Diag, S OM, S PL   Coronary artery disease    Depression    Headache(784.0)    HTN (hypertension)    Hx of skin cancer, basal cell    Hyperlipidemia    Hypothyroidism    Kidney stones    Melanoma ([place])    PAD (peripheral artery disease) ([place]) 09/20/2014   Prostate cancer ([place])    Restless leg syndrome    Sepsis ([place])     Past Surgical History:  Procedure Laterality Date   BASAL CELL CARCINOMA EXCISION  2000    Dr,Arkin    CARDIAC CATHETERIZATION  07/07/2011   CHOLECYSTECTOMY  09/14/2011   Procedure: LAPAROSCOPIC CHOLECYSTECTOMY WITH INTRAOPERATIVE CHOLANGIOGRAM;  Surgeon: Gwenyth Ober, MD;  Location: [facility];  Service: General;  Laterality: N/A;   CORONARY ARTERY BYPASS GRAFT  07/10/2011   Procedure: CORONARY ARTERY BYPASS GRAFTING (CABG);  Surgeon: Ivin Poot, MD;  Location: [facility];  Service: Open Heart Surgery;  Laterality: N/A;   EXCISIONAL HEMORRHOIDECTOMY  1997   HEMORROIDECTOMY  2000   LEFT HEART CATHETERIZATION WITH CORONARY ANGIOGRAM N/A 07/07/2011   Procedure: LEFT HEART CATHETERIZATION WITH CORONARY ANGIOGRAM;  Surgeon: Burnell Blanks, MD;  Location: [facility] CATH LAB;  Service: Cardiovascular;  Laterality: N/A;     Current Outpatient Medications  Medication Sig Dispense Refill   ammonium lactate (LAC-HYDRIN) 12 % lotion APPLY 1 APPLICATION TOPICALLY AS NEEDED FOR DRY SKIN. 400 mL 1   aspirin EC 81 MG tablet Take 1 tablet (81 mg total) by mouth daily. Swallow whole. 90 tablet 3   clotrimazole-betamethasone (LOTRISONE) cream APPLY 1 APPLICATION. TOPICALLY 2 (TWO) TIMES DAILY. 30 g 0   diazepam (VALIUM) 10 MG tablet Take 10 mg by mouth 2 (two) times daily.  diphenoxylate-atropine (LOMOTIL) 2.5-0.025 MG tablet Take one tablet before meals as needed for diarrhea     donepezil (ARICEPT) 10 MG tablet TAKE 1 TABLET BY MOUTH EVERYDAY AT BEDTIME 90 tablet 1   imipramine (TOFRANIL) 50 MG tablet Take 100 mg by mouth at bedtime.     levothyroxine (SYNTHROID) 175 MCG tablet Take 175 mcg by mouth daily before breakfast.      memantine (NAMENDA XR) 28 MG CP24 24 hr capsule Take 28 mg by mouth in the morning.     metoprolol tartrate (LOPRESSOR) 25 MG tablet Take 25 mg by mouth 2 (two) times daily.     mupirocin ointment (BACTROBAN) 2 % Apply 1 Application topically 2 (two) times daily. 30 g 2   NITROSTAT 0.4 MG SL tablet Place 0.4 mg under the tongue every 5 (five) minutes as needed for chest  pain. Reported on 10/26/2015     pentazocine-naloxone (TALWIN NX) 50-0.5 MG tablet Take 1 tablet by mouth in the morning and at bedtime.     phenazopyridine (PYRIDIUM) 95 MG tablet Take 95 mg by mouth 3 (three) times daily as needed (urinary pain relief).     simvastatin (ZOCOR) 20 MG tablet TAKE ONE TABLET BY MOUTH ONCE DAILY FOR CHOLESTEROL (Patient taking differently: Take 20 mg by mouth every evening. Take one tablet by mouth once daily for cholesterol) 90 tablet 1   tamsulosin (FLOMAX) 0.4 MG CAPS capsule Take 0.4 mg by mouth 2 (two) times daily.     triamcinolone (KENALOG) 0.025 % ointment APPLY TO AFFECTED AREA TWICE A DAY 30 g 0   clotrimazole (LOTRIMIN) 1 % external solution 1 application 2 (two) times daily as needed (skin irritation).     gabapentin (NEURONTIN) 100 MG capsule TAKE 1 CAPSULE BY MOUTH THREE TIMES A DAY 90 capsule 3   metFORMIN (GLUCOPHAGE-XR) 500 MG 24 hr tablet Take 500 mg by mouth daily with breakfast. (Patient not taking: Reported on 06/22/2022)     No current facility-administered medications for this visit.    Allergies:   Adhesive [tape]   ROS:  Please see the history of present illness.   Otherwise, review of systems are positive for none.   All other systems are reviewed and negative.    PHYSICAL EXAM: VS:  BP (!) 140/82   Pulse (!) 59   Ht '5\' 10"'$  (1.778 m)   Wt 169 lb (76.7 kg)   BMI 24.25 kg/m  , BMI Body mass index is 24.25 kg/m. GENERAL:  Well appearing NECK:  No jugular venous distention, waveform within normal limits, carotid upstroke brisk and symmetric, no bruits, no thyromegaly LUNGS:  Clear to auscultation bilaterally CHEST:  Well healed sternotomy scar. HEART:  PMI not displaced or sustained,S1 and S2 within normal limits, no S3, no S4, no clicks, no rubs, no murmurs ABD:  Flat, positive bowel sounds normal in frequency in pitch, no bruits, no rebound, no guarding, no midline pulsatile mass, no hepatomegaly, no splenomegaly EXT:  2 plus  pulses throughout, no edema, no cyanosis no clubbing  EKG:  EKG is  ordered today. The ekg ordered  demonstrates sinus tachycardia, rate 59, nonspecific diffuse T wave changes. No change from previous.    Recent Labs: No results found for requested labs within last 365 days.    Lipid Panel    Component Value Date/Time   CHOL 166 04/17/2016 0928   CHOL 128 10/22/2015 0816   TRIG 242 (H) 04/17/2016 0928   HDL 34 (L) 04/17/2016 [id number]  HDL 30 (L) 10/22/2015 0816   CHOLHDL 4.9 04/17/2016 0928   VLDL 48 (H) 04/17/2016 0928   LDLCALC 84 04/17/2016 0928   LDLCALC 48 10/22/2015 0816      Wt Readings from Last 3 Encounters:  06/28/22 169 lb (76.7 kg)  12/04/21 171 lb (77.6 kg)  05/18/21 180 lb 6.4 oz (81.8 kg)      Other studies Reviewed: Additional studies/ records that were reviewed today include: CT at [facility] Review of the above records demonstrates:  Please see elsewhere in the note.     ASSESSMENT AND PLAN:  CAD:   The patient has no new sypmtoms.  No further cardiovascular testing is indicated.  We will continue with aggressive risk reduction and meds as listed.  DYSLIPIDEMIA:  LDL was 48.  HDL 30.  No change I therapy.   HTN: Blood pressures was at target.  No change in therapy.   DM:  A1c was down to 6.1 most recently and I reviewed these labs done earlier this year.   QUESTIONABLE PSEUDOANEURYSM ICA: I be happy to review the imaging when available.  Patient will notify me.  This has been ordered by primary provider.   Current medicines are reviewed at length with the patient today.  The patient does not have concerns regarding medicines.  The following changes have been made: None  Labs/ tests ordered today include: None  Orders Placed This Encounter  Procedures   EKG 12-Lead     Disposition:   FU with me in 12 months.     Signed, Minus Breeding, MD  06/28/2022 11:01 AM    [place]

## 2022-06-28 ENCOUNTER — Encounter: Payer: Self-pay | Admitting: Cardiology

## 2022-06-28 ENCOUNTER — Ambulatory Visit: Payer: Medicare HMO | Admitting: Cardiology

## 2022-06-28 VITALS — BP 140/82 | HR 59 | Ht 70.0 in | Wt 169.0 lb

## 2022-06-28 DIAGNOSIS — E118 Type 2 diabetes mellitus with unspecified complications: Secondary | ICD-10-CM | POA: Diagnosis not present

## 2022-06-28 DIAGNOSIS — I2583 Coronary atherosclerosis due to lipid rich plaque: Secondary | ICD-10-CM

## 2022-06-28 DIAGNOSIS — E785 Hyperlipidemia, unspecified: Secondary | ICD-10-CM | POA: Diagnosis not present

## 2022-06-28 DIAGNOSIS — I10 Essential (primary) hypertension: Secondary | ICD-10-CM | POA: Diagnosis not present

## 2022-06-28 DIAGNOSIS — I2510 Atherosclerotic heart disease of native coronary artery without angina pectoris: Secondary | ICD-10-CM

## 2022-06-28 NOTE — Patient Instructions (Signed)
Medication Instructions:  The current medical regimen is effective;  continue present plan and medications.  *If you need a refill on your cardiac medications before your next appointment, please call your pharmacy*  Follow-Up: At [facility], you and your health needs are our priority.  As part of our continuing mission to provide you with exceptional heart care, we have created designated Provider Care Teams.  These Care Teams include your primary Cardiologist (physician) and Advanced Practice Providers (APPs -  Physician Assistants and Nurse Practitioners) who all work together to provide you with the care you need, when you need it.  We recommend signing up for the patient portal called "MyChart".  Sign up information is provided on this After Visit Summary.  MyChart is used to connect with patients for Virtual Visits (Telemedicine).  Patients are able to view lab/test results, encounter notes, upcoming appointments, etc.  Non-urgent messages can be sent to your provider as well.   To learn more about what you can do with MyChart, go to [email or web address].    Your next appointment:   1 year(s)  Provider:   Minus Breeding, MD

## 2022-07-04 ENCOUNTER — Emergency Department (HOSPITAL_COMMUNITY): Payer: Medicare HMO

## 2022-07-04 ENCOUNTER — Encounter (HOSPITAL_COMMUNITY): Payer: Self-pay | Admitting: Emergency Medicine

## 2022-07-04 ENCOUNTER — Emergency Department (HOSPITAL_COMMUNITY)
Admission: EM | Admit: 2022-07-04 | Discharge: 2022-07-04 | Disposition: A | Discharge status: Home / Self Care | Payer: Medicare HMO | Attending: Student | Admitting: Student

## 2022-07-04 ENCOUNTER — Other Ambulatory Visit: Payer: Self-pay

## 2022-07-04 DIAGNOSIS — Z8546 Personal history of malignant neoplasm of prostate: Secondary | ICD-10-CM | POA: Insufficient documentation

## 2022-07-04 DIAGNOSIS — E039 Hypothyroidism, unspecified: Secondary | ICD-10-CM | POA: Insufficient documentation

## 2022-07-04 DIAGNOSIS — Z85820 Personal history of malignant melanoma of skin: Secondary | ICD-10-CM | POA: Insufficient documentation

## 2022-07-04 DIAGNOSIS — J189 Pneumonia, unspecified organism: Secondary | ICD-10-CM | POA: Insufficient documentation

## 2022-07-04 DIAGNOSIS — Z7982 Long term (current) use of aspirin: Secondary | ICD-10-CM | POA: Diagnosis not present

## 2022-07-04 DIAGNOSIS — Z1152 Encounter for screening for COVID-19: Secondary | ICD-10-CM | POA: Diagnosis not present

## 2022-07-04 DIAGNOSIS — E119 Type 2 diabetes mellitus without complications: Secondary | ICD-10-CM | POA: Diagnosis not present

## 2022-07-04 DIAGNOSIS — Z951 Presence of aortocoronary bypass graft: Secondary | ICD-10-CM | POA: Insufficient documentation

## 2022-07-04 DIAGNOSIS — R0609 Other forms of dyspnea: Secondary | ICD-10-CM | POA: Insufficient documentation

## 2022-07-04 DIAGNOSIS — Z87891 Personal history of nicotine dependence: Secondary | ICD-10-CM | POA: Diagnosis not present

## 2022-07-04 DIAGNOSIS — I2510 Atherosclerotic heart disease of native coronary artery without angina pectoris: Secondary | ICD-10-CM | POA: Diagnosis not present

## 2022-07-04 DIAGNOSIS — Z85828 Personal history of other malignant neoplasm of skin: Secondary | ICD-10-CM | POA: Diagnosis not present

## 2022-07-04 DIAGNOSIS — R042 Hemoptysis: Secondary | ICD-10-CM | POA: Diagnosis present

## 2022-07-04 DIAGNOSIS — I10 Essential (primary) hypertension: Secondary | ICD-10-CM | POA: Diagnosis not present

## 2022-07-04 LAB — COMPREHENSIVE METABOLIC PANEL
ALT: 29 U/L (ref 0–44)
AST: 23 U/L (ref 15–41)
Albumin: 3.7 g/dL (ref 3.5–5.0)
Alkaline Phosphatase: 116 U/L (ref 38–126)
Anion gap: 7 (ref 5–15)
BUN: 19 mg/dL (ref 8–23)
CO2: 34 mmol/L — ABNORMAL HIGH (ref 22–32)
Calcium: 8.5 mg/dL — ABNORMAL LOW (ref 8.9–10.3)
Chloride: 95 mmol/L — ABNORMAL LOW (ref 98–111)
Creatinine, Ser: 0.93 mg/dL (ref 0.61–1.24)
GFR, Estimated: >60 mL/min (ref >60)
Glucose, Bld: 165 mg/dL — ABNORMAL HIGH (ref 70–99)
Potassium: 3.1 mmol/L — ABNORMAL LOW (ref 3.5–5.1)
Sodium: 136 mmol/L (ref 135–145)
Total Bilirubin: 0.5 mg/dL (ref 0.3–1.2)
Total Protein: 7.5 g/dL (ref 6.5–8.1)

## 2022-07-04 LAB — CBC WITH DIFFERENTIAL/PLATELET
Abs Immature Granulocytes: 0.02 10*3/uL (ref 0.00–0.07)
Basophils Absolute: 0 10*3/uL (ref 0.0–0.1)
Basophils Relative: 0 %
Eosinophils Absolute: 0.2 10*3/uL (ref 0.0–0.5)
Eosinophils Relative: 2 %
HCT: 41.4 % (ref 39.0–52.0)
Hemoglobin: 13.5 g/dL (ref 13.0–17.0)
Immature Granulocytes: 0 %
Lymphocytes Relative: 38 %
Lymphs Abs: 3 10*3/uL (ref 0.7–4.0)
MCH: 28.1 pg (ref 26.0–34.0)
MCHC: 32.6 g/dL (ref 30.0–36.0)
MCV: 86.3 fL (ref 80.0–100.0)
Monocytes Absolute: 0.4 10*3/uL (ref 0.1–1.0)
Monocytes Relative: 5 %
Neutro Abs: 4.3 10*3/uL (ref 1.7–7.7)
Neutrophils Relative %: 55 %
Platelets: 122 10*3/uL — ABNORMAL LOW (ref 150–400)
RBC: 4.8 MIL/uL (ref 4.22–5.81)
RDW: 15 % (ref 11.5–15.5)
WBC: 8 10*3/uL (ref 4.0–10.5)
nRBC: 0 % (ref 0.0–0.2)

## 2022-07-04 LAB — RESP PANEL BY RT-PCR (RSV, FLU A&B, COVID)  RVPGX2
Influenza A by PCR: NEGATIVE
Influenza B by PCR: NEGATIVE
Resp Syncytial Virus by PCR: NEGATIVE
SARS Coronavirus 2 by RT PCR: NEGATIVE

## 2022-07-04 LAB — BRAIN NATRIURETIC PEPTIDE: B Natriuretic Peptide: 43 pg/mL (ref 0.0–100.0)

## 2022-07-04 LAB — TROPONIN I (HIGH SENSITIVITY)
Troponin I (High Sensitivity): 4 ng/L (ref <18)
Troponin I (High Sensitivity): 4 ng/L (ref <18)

## 2022-07-04 MED ORDER — IOHEXOL 350 MG/ML SOLN
75.0000 mL | Freq: Once | INTRAVENOUS | Status: AC | PRN
  Administered 2022-07-04: 75 mL via INTRAVENOUS

## 2022-07-04 MED ORDER — AZITHROMYCIN 250 MG PO TABS: 250.0000 mg | ORAL_TABLET | Freq: Every day | ORAL | 0 refills | Status: DC

## 2022-07-04 MED ORDER — AMOXICILLIN-POT CLAVULANATE 875-125 MG PO TABS: 1.0000 | ORAL_TABLET | Freq: Two times a day (BID) | ORAL | 0 refills | Status: DC

## 2022-07-04 MED ORDER — AZITHROMYCIN 250 MG PO TABS
500.0000 mg | ORAL_TABLET | Freq: Once | ORAL | Status: AC
  Administered 2022-07-04: 500 mg via ORAL
  Filled 2022-07-04: qty 2

## 2022-07-04 MED ORDER — AMOXICILLIN-POT CLAVULANATE 875-125 MG PO TABS
1.0000 | ORAL_TABLET | Freq: Once | ORAL | Status: AC
  Administered 2022-07-04: 1 via ORAL
  Filled 2022-07-04: qty 1

## 2022-07-04 NOTE — ED Notes (Signed)
Patient transported to CT 

## 2022-07-04 NOTE — ED Triage Notes (Signed)
Per caregiver pt has had hacking cough x one month, he started having chills Sunday and coughing up blood today.

## 2022-07-04 NOTE — ED Provider Notes (Signed)
[place] Provider Note  CSN: [id number] Arrival date & time: 07/04/22 1934  Chief Complaint(s) No chief complaint on file.  HPI Dean Cole is a 72 y.o. male with PMH T2DM, CAD, HTN, HLD, prostate cancer, PAD who presents emergency room for evaluation of cough and hemoptysis.  Patient states that for the last month he has had this progressive hacking cough and over the last 24 hours has noticed streaks of blood in his cough and 1 particular episode with blood colored mucus.  He denies chest pain, shortness of breath, abdominal pain, nausea, vomiting or other systemic symptoms.  Patient arrives hemodynamically stable and saturating well on room air.   Past Medical History Past Medical History:  Diagnosis Date   Anxiety    Basal cell carcinoma    Cholecystitis    Controlled type 2 DM with peripheral circulatory disorder ([place]) 09/20/2014   L LAD, S Diag, S OM, S PL   Coronary artery disease    Depression    Headache(784.0)    HTN (hypertension)    Hx of skin cancer, basal cell    Hyperlipidemia    Hypothyroidism    Kidney stones    Melanoma ([place])    PAD (peripheral artery disease) ([place]) 09/20/2014   Prostate cancer ([place])    Restless leg syndrome    Sepsis [facility])    Patient Active Problem List   Diagnosis Date Noted   Extensive tattoos 12/13/2020   Multiple body piercings 12/13/2020   Ataxia 12/07/2020   Diarrhea 12/07/2020   Pierced tongue 08/29/2020   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   Chronic diarrhea [id number]   Acute metabolic encephalopathy [id number]   Urinary urgency 10/05/2019   Hyperglycemia due to diabetes mellitus ([place]) 10/05/2019   Educated about COVID-19 virus infection 08/05/2019   Balance problem 07/08/2019   Frequent falls 07/08/2019   Poor appetite 07/08/2019   Acute kidney failure ([place]) 07/07/2019   AMS (altered mental status) 07/07/2019   Postural dizziness 07/07/2019   Hypocalcemia  07/07/2019   Sepsis ([place]) 07/07/2019   Colitis 07/07/2019   N&V (nausea and vomiting) 07/06/2019   Lung nodule 07/06/2019   Fever 07/06/2019   Chronic pain syndrome 05/29/2019   Malignant neoplasm of prostate ([place]) 07/23/2018   Cellulitis of toe 05/23/2018   Acquired deformity of toenail 05/15/2018   Foot callus 04/10/2018   Long toenail 04/10/2018   Encounter for Medicare annual wellness exam 12/19/2017   Ureteral stone with hydronephrosis 08/04/2017   Anxiety, generalized 03/19/2017   Essential hypertension 03/19/2017   Moderate episode of recurrent major depressive disorder ([place]) 03/19/2017   Sensorineural hearing loss (SNHL) of both ears 03/19/2017   Hearing loss 04/19/2016   PAD (peripheral artery disease) ([place]) 09/20/2014   Controlled type 2 DM with peripheral circulatory disorder ([place]) 09/20/2014   Depression 09/16/2014   Erectile dysfunction 07/02/2014   Type II diabetes mellitus with renal manifestations ([place]) 03/04/2014   Eczema 12/25/2012   Anxiety    Dyslipidemia 02/29/2012   Hypertensive heart disease without CHF    Hypothyroidism    Coronary artery disease    Basal cell carcinoma    Headache    Restless leg syndrome    Melanoma of skin ([place]) 06/14/2001   Home Medication(s) Prior to Admission medications   Medication Sig Start Date End Date Taking? Authorizing Provider  amoxicillin-clavulanate (AUGMENTIN) 875-125 MG tablet Take 1 tablet by mouth every 12 (twelve) hours. 07/04/22  Yes Kishaun Erekson, Debe Coder, MD  azithromycin (  ZITHROMAX) 250 MG tablet Take 1 tablet (250 mg total) by mouth daily. Take first 2 tablets together, then 1 every day until finished. 07/04/22  Yes Teren Zurcher, MD  ammonium lactate (LAC-HYDRIN) 12 % lotion APPLY 1 APPLICATION TOPICALLY AS NEEDED FOR DRY SKIN. 04/07/22   Trula Slade, DPM  aspirin EC 81 MG tablet Take 1 tablet (81 mg total) by mouth daily. Swallow whole. 05/26/20   Minus Breeding, MD  clotrimazole (LOTRIMIN) 1 % external  solution 1 application 2 (two) times daily as needed (skin irritation). 09/09/20   [provider]  clotrimazole-betamethasone (LOTRISONE) cream APPLY 1 APPLICATION. TOPICALLY 2 (TWO) TIMES DAILY. 10/14/21   Trula Slade, DPM  diazepam (VALIUM) 10 MG tablet Take 10 mg by mouth 2 (two) times daily. 09/22/19   [provider]  diphenoxylate-atropine (LOMOTIL) 2.5-0.025 MG tablet Take one tablet before meals as needed for diarrhea 12/07/20   [provider]  donepezil (ARICEPT) 10 MG tablet TAKE 1 TABLET BY MOUTH EVERYDAY AT BEDTIME 01/29/20   Jaffe, Adam R, DO  gabapentin (NEURONTIN) 100 MG capsule TAKE 1 CAPSULE BY MOUTH THREE TIMES A DAY 04/07/22   Trula Slade, DPM  imipramine (TOFRANIL) 50 MG tablet Take 100 mg by mouth at bedtime.    [provider]  levothyroxine (SYNTHROID) 175 MCG tablet Take 175 mcg by mouth daily before breakfast.  07/08/19   [provider]  memantine (NAMENDA XR) 28 MG CP24 24 hr capsule Take 28 mg by mouth in the morning.    [provider]  metFORMIN (GLUCOPHAGE-XR) 500 MG 24 hr tablet Take 500 mg by mouth daily with breakfast. Patient not taking: Reported on 06/22/2022    [provider]  metoprolol tartrate (LOPRESSOR) 25 MG tablet Take 25 mg by mouth 2 (two) times daily.    [provider]  mupirocin ointment (BACTROBAN) 2 % Apply 1 Application topically 2 (two) times daily. 11/24/21   Trula Slade, DPM  NITROSTAT 0.4 MG SL tablet Place 0.4 mg under the tongue every 5 (five) minutes as needed for chest pain. Reported on 10/26/2015 07/06/11   [provider]  pentazocine-naloxone (TALWIN NX) 50-0.5 MG tablet Take 1 tablet by mouth in the morning and at bedtime.    [provider]  phenazopyridine (PYRIDIUM) 95 MG tablet Take 95 mg by mouth 3 (three) times daily as needed (urinary pain relief).    [provider]  simvastatin (ZOCOR) 20 MG tablet TAKE ONE TABLET BY  MOUTH ONCE DAILY FOR CHOLESTEROL Patient taking differently: Take 20 mg by mouth every evening. Take one tablet by mouth once daily for cholesterol 10/16/16   Reed, Tiffany L, DO  tamsulosin (FLOMAX) 0.4 MG CAPS capsule Take 0.4 mg by mouth 2 (two) times daily.    [provider]  triamcinolone (KENALOG) 0.025 % ointment APPLY TO AFFECTED AREA TWICE A DAY 10/14/21   Trula Slade, DPM  Past Surgical History Past Surgical History:  Procedure Laterality Date   BASAL CELL CARCINOMA EXCISION  2000   Dr,Arkin    CARDIAC CATHETERIZATION  07/07/2011   CHOLECYSTECTOMY  09/14/2011   Procedure: LAPAROSCOPIC CHOLECYSTECTOMY WITH INTRAOPERATIVE CHOLANGIOGRAM;  Surgeon: Gwenyth Ober, MD;  Location: [place];  Service: General;  Laterality: N/A;   CORONARY ARTERY BYPASS GRAFT  07/10/2011   Procedure: CORONARY ARTERY BYPASS GRAFTING (CABG);  Surgeon: Ivin Poot, MD;  Location: [facility];  Service: Open Heart Surgery;  Laterality: N/A;   EXCISIONAL HEMORRHOIDECTOMY  1997   HEMORROIDECTOMY  2000   LEFT HEART CATHETERIZATION WITH CORONARY ANGIOGRAM N/A 07/07/2011   Procedure: LEFT HEART CATHETERIZATION WITH CORONARY ANGIOGRAM;  Surgeon: Burnell Blanks, MD;  Location[facility] C[facility];  Service: Cardiovascular;  Laterality: N/A;   Family History Family History  Problem Relation Age of Onset   Coronary artery disease Father    Prostate cancer Father    Diabetes Father    Leukemia Mother    Suicidality Brother    Drug abuse Brother    Coronary artery disease Paternal Grandfather    Breast cancer Neg Hx    Colon cancer Neg Hx    Pancreatic cancer Neg Hx     Social History Social History   Tobacco Use   Smoking status: Former    Packs/day: 1.00    Years: 21.00    Total pack years: 21.00    Types: Cigarettes    Quit date: 08/08/1981    Years since quitting:  40.9   Smokeless tobacco: Former  Scientific laboratory technician Use: Never used  Substance Use Topics   Alcohol use: No    Comment: former   Drug use: No   Allergies Adhesive [tape]  Review of Systems Review of Systems  Respiratory:  Positive for cough.     Physical Exam Vital Signs  I have reviewed the triage vital signs BP (!) 163/73   Pulse 60   Temp 98 F (36.7 C)   Resp 18   Ht '5\' 10"'$  (1.778 m)   Wt 76.7 kg   SpO2 94%   BMI 24.26 kg/m   Physical Exam Constitutional:      General: He is not in acute distress.    Appearance: Normal appearance.  HENT:     Head: Normocephalic and atraumatic.     Nose: No congestion or rhinorrhea.  Eyes:     General:        Right eye: No discharge.        Left eye: No discharge.     Extraocular Movements: Extraocular movements intact.     Pupils: Pupils are equal, round, and reactive to light.  Cardiovascular:     Rate and Rhythm: Normal rate and regular rhythm.     Heart sounds: No murmur heard. Pulmonary:     Effort: No respiratory distress.     Breath sounds: Rales present. No wheezing.  Abdominal:     General: There is no distension.     Tenderness: There is no abdominal tenderness.  Musculoskeletal:        General: Normal range of motion.     Cervical back: Normal range of motion.  Skin:    General: Skin is warm and dry.  Neurological:     General: No focal deficit present.     Mental Status: He is alert.     ED Results and Treatments Labs (all labs ordered are listed, but only abnormal results  are displayed) Labs Reviewed  COMPREHENSIVE METABOLIC PANEL - Abnormal; Notable for the following components:      Result Value   Potassium 3.1 (*)    Chloride 95 (*)    CO2 34 (*)    Glucose, Bld 165 (*)    Calcium 8.5 (*)    All other components within normal limits  CBC WITH DIFFERENTIAL/PLATELET - Abnormal; Notable for the following components:   Platelets 122 (*)    All other components within normal limits  RESP  PANEL BY RT-PCR (RSV, FLU A&B, COVID)  RVPGX2  BRAIN NATRIURETIC PEPTIDE  TROPONIN I (HIGH SENSITIVITY)  TROPONIN I (HIGH SENSITIVITY)                                                                                                                          Radiology CT Angio Chest PE W and/or Wo Contrast  Result Date: 07/04/2022 CLINICAL DATA:  Pulmonary embolism suspected, high probability. Cough for 1 month. Chills on Saturday. EXAM: CT ANGIOGRAPHY CHEST WITH CONTRAST TECHNIQUE: Multidetector CT imaging of the chest was performed using the standard protocol during bolus administration of intravenous contrast. Multiplanar CT image reconstructions and MIPs were obtained to evaluate the vascular anatomy. RADIATION DOSE REDUCTION: This exam was performed according to the departmental dose-optimization program which includes automated exposure control, adjustment of the mA and/or kV according to patient size and/or use of iterative reconstruction technique. CONTRAST:  29m OMNIPAQUE IOHEXOL 350 MG/ML SOLN COMPARISON:  None Available. FINDINGS: Cardiovascular: Satisfactory opacification of the pulmonary arteries to the segmental level. No evidence of pulmonary embolism. Borderline heart size. No pericardial effusion. Extensive atheromatous calcification of the aorta and coronaries. There has been CABG. Mediastinum/Nodes: Negative for mass or adenopathy Lungs/Pleura: Reticulonodular airspace disease in the right lower lobe. Bands of opacity in the mid to lower lungs consistent with atelectasis. No edema, effusion, or pneumothorax. Upper Abdomen: Cholecystectomy.  No acute finding Musculoskeletal: No acute or aggressive finding. Chronic T1 and T11 superior endplate fracture Review of the MIP images confirms the above findings. IMPRESSION: 1. Right lower lobe pneumonia.  Bilateral atelectasis. 2. Negative for pulmonary embolism. Electronically Signed   By: JJorje GuildM.D.   On: 07/04/2022 21:00   DG Chest 2  View  Result Date: 07/04/2022 CLINICAL DATA:  Dyspnea EXAM: CHEST - 2 VIEW COMPARISON:  10/04/2019 FINDINGS: Cardiac shadow is at the upper limits of normal in size. Postsurgical changes are again seen. Bibasilar scarring is noted stable from the prior exam. No focal infiltrate or effusion is seen. No bony abnormality is noted. IMPRESSION: Chronic scarring in the bases.  No acute abnormality noted. Electronically Signed   By: MInez CatalinaM.D.   On: 07/04/2022 20:23    Pertinent labs & imaging results that were available during my care of the patient were reviewed by me and considered in my medical decision making (see MDM for details).  Medications Ordered in ED Medications  iohexol (OMNIPAQUE) 350 MG/ML injection 75  mL (75 mLs Intravenous Contrast Given 07/04/22 2052)  amoxicillin-clavulanate (AUGMENTIN) 875-125 MG per tablet 1 tablet (1 tablet Oral Given 07/04/22 2136)  azithromycin (ZITHROMAX) tablet 500 mg (500 mg Oral Given 07/04/22 2136)                                                                                                                                     Procedures Procedures  (including critical care time)  Medical Decision Making / ED Course   This patient presents to the ED for concern of cough, hemoptysis, this involves an extensive number of treatment options, and is a complaint that carries with it a high risk of complications and morbidity.  The differential diagnosis includes pneumonia, COVID-19, influenza, RSV, unspecified viral URI, malignancy, pulmonary AVM, PE  MDM: Patient seen emergency room for evaluation of cough and hemoptysis.  Physical exam reveals rales at the left lung base but is otherwise unremarkable.  Laboratory evaluation unremarkable with normal hemoglobin at 13.5, mild hypokalemia at 3.1.  High-sensitivity troponin unremarkable, BNP 43 and obtained in the setting of persistent cough to evaluate for fluid overload.  COVID, flu, RSV negative and obtained  in the setting of persistent cough.  Chest x-ray with chronic scarring at the lung bases and follow-up CT PE obtained due to cough and hemoptysis which was reassuringly negative for PE but does show left lower lobe pneumonia.  Ambulatory pulse ox trial performed and patient did not become persistently tachycardic, short of breath or hypoxic.  Thus he is able to be treated outpatient for his lower lobe pneumonia and was started on Augmentin and azithromycin.  At this time he does not meet inpatient criteria for admission he is safe for discharge with outpatient follow-up.  Patient will follow-up with his PCP in 1 week.   Additional history obtained: -Additional history obtained from caregiver -External records from outside source obtained and reviewed including: Chart review including previous notes, labs, imaging, consultation notes   Lab Tests: -I ordered, reviewed, and interpreted labs.   The pertinent results include:   Labs Reviewed  COMPREHENSIVE METABOLIC PANEL - Abnormal; Notable for the following components:      Result Value   Potassium 3.1 (*)    Chloride 95 (*)    CO2 34 (*)    Glucose, Bld 165 (*)    Calcium 8.5 (*)    All other components within normal limits  CBC WITH DIFFERENTIAL/PLATELET - Abnormal; Notable for the following components:   Platelets 122 (*)    All other components within normal limits  RESP PANEL BY RT-PCR (RSV, FLU A&B, COVID)  RVPGX2  BRAIN NATRIURETIC PEPTIDE  TROPONIN I (HIGH SENSITIVITY)  TROPONIN I (HIGH SENSITIVITY)       Imaging Studies ordered: I ordered imaging studies including chest x-ray, CT PE I independently visualized and interpreted imaging. I agree with the radiologist interpretation   Medicines ordered and prescription drug management:  Meds ordered this encounter  Medications   iohexol (OMNIPAQUE) 350 MG/ML injection 75 mL   amoxicillin-clavulanate (AUGMENTIN) 875-125 MG per tablet 1 tablet   amoxicillin-clavulanate  (AUGMENTIN) 875-125 MG tablet    Sig: Take 1 tablet by mouth every 12 (twelve) hours.    Dispense:  14 tablet    Refill:  0   azithromycin (ZITHROMAX) 250 MG tablet    Sig: Take 1 tablet (250 mg total) by mouth daily. Take first 2 tablets together, then 1 every day until finished.    Dispense:  6 tablet    Refill:  0   azithromycin (ZITHROMAX) tablet 500 mg    -I have reviewed the patients home medicines and have made adjustments as needed  Critical interventions none    Cardiac Monitoring: The patient was maintained on a cardiac monitor.  I personally viewed and interpreted the cardiac monitored which showed an underlying rhythm of: NSR  Social Determinants of Health:  Factors impacting patients care include: none   Reevaluation: After the interventions noted above, I reevaluated the patient and found that they have :improved  Co morbidities that complicate the patient evaluation  Past Medical History:  Diagnosis Date   Anxiety    Basal cell carcinoma    Cholecystitis    Controlled type 2 DM with peripheral circulatory disorder ([place]) 09/20/2014   L LAD, S Diag, S OM, S PL   Coronary artery disease    Depression    Headache(784.0)    HTN (hypertension)    Hx of skin cancer, basal cell    Hyperlipidemia    Hypothyroidism    Kidney stones    Melanoma ([place])    PAD (peripheral artery disease) ([place]) 09/20/2014   Prostate cancer ([place])    Restless leg syndrome    Sepsis ([place])       Dispostion: I considered admission for this patient, but at this time he does not meet inpatient criteria for admission he is safe for discharge with outpatient follow-up     Final Clinical Impression(s) / ED Diagnoses Final diagnoses:  Pneumonia due to infectious organism, unspecified laterality, unspecified part of lung     '@PCDICTATION'$ @    Teressa Lower, MD 07/04/22 2253

## 2022-07-04 NOTE — ED Notes (Signed)
Ambulated patient around nurses station with walker assist. O2 sat range 88-95% room air, pulse maintaining 76bpm.

## 2022-09-21 ENCOUNTER — Ambulatory Visit: Payer: Medicare HMO | Admitting: Podiatry

## 2022-09-21 DIAGNOSIS — M79674 Pain in right toe(s): Secondary | ICD-10-CM

## 2022-09-21 DIAGNOSIS — B351 Tinea unguium: Secondary | ICD-10-CM | POA: Diagnosis not present

## 2022-09-21 DIAGNOSIS — E1149 Type 2 diabetes mellitus with other diabetic neurological complication: Secondary | ICD-10-CM

## 2022-09-21 DIAGNOSIS — M79675 Pain in left toe(s): Secondary | ICD-10-CM

## 2022-09-24 ENCOUNTER — Emergency Department (HOSPITAL_BASED_OUTPATIENT_CLINIC_OR_DEPARTMENT_OTHER)
Admission: EM | Admit: 2022-09-24 | Discharge: 2022-09-24 | Disposition: A | Discharge status: Home / Self Care | Payer: Medicare HMO | Attending: Emergency Medicine | Admitting: Emergency Medicine

## 2022-09-24 ENCOUNTER — Emergency Department (HOSPITAL_BASED_OUTPATIENT_CLINIC_OR_DEPARTMENT_OTHER): Payer: Medicare HMO

## 2022-09-24 ENCOUNTER — Other Ambulatory Visit: Payer: Self-pay

## 2022-09-24 ENCOUNTER — Encounter (HOSPITAL_BASED_OUTPATIENT_CLINIC_OR_DEPARTMENT_OTHER): Payer: Self-pay | Admitting: Emergency Medicine

## 2022-09-24 DIAGNOSIS — R519 Headache, unspecified: Secondary | ICD-10-CM | POA: Diagnosis present

## 2022-09-24 DIAGNOSIS — Z951 Presence of aortocoronary bypass graft: Secondary | ICD-10-CM | POA: Insufficient documentation

## 2022-09-24 DIAGNOSIS — I10 Essential (primary) hypertension: Secondary | ICD-10-CM | POA: Insufficient documentation

## 2022-09-24 DIAGNOSIS — Z87891 Personal history of nicotine dependence: Secondary | ICD-10-CM | POA: Diagnosis not present

## 2022-09-24 DIAGNOSIS — Z79899 Other long term (current) drug therapy: Secondary | ICD-10-CM | POA: Diagnosis not present

## 2022-09-24 DIAGNOSIS — R059 Cough, unspecified: Secondary | ICD-10-CM | POA: Diagnosis not present

## 2022-09-24 DIAGNOSIS — Z7982 Long term (current) use of aspirin: Secondary | ICD-10-CM | POA: Insufficient documentation

## 2022-09-24 DIAGNOSIS — I2510 Atherosclerotic heart disease of native coronary artery without angina pectoris: Secondary | ICD-10-CM | POA: Insufficient documentation

## 2022-09-24 DIAGNOSIS — Z955 Presence of coronary angioplasty implant and graft: Secondary | ICD-10-CM | POA: Insufficient documentation

## 2022-09-24 DIAGNOSIS — E876 Hypokalemia: Secondary | ICD-10-CM | POA: Insufficient documentation

## 2022-09-24 HISTORY — DX: Polyneuropathy, unspecified: G62.9

## 2022-09-24 LAB — URINALYSIS, MICROSCOPIC (REFLEX)

## 2022-09-24 LAB — URINALYSIS, ROUTINE W REFLEX MICROSCOPIC
Bilirubin Urine: NEGATIVE
Glucose, UA: NEGATIVE mg/dL
Ketones, ur: NEGATIVE mg/dL
Leukocytes,Ua: NEGATIVE
Nitrite: NEGATIVE
Protein, ur: NEGATIVE mg/dL
Specific Gravity, Urine: 1.01 (ref 1.005–1.030)
pH: 6.5 (ref 5.0–8.0)

## 2022-09-24 LAB — CBC WITH DIFFERENTIAL/PLATELET
Abs Immature Granulocytes: 0.03 10*3/uL (ref 0.00–0.07)
Basophils Absolute: 0 10*3/uL (ref 0.0–0.1)
Basophils Relative: 0 %
Eosinophils Absolute: 0.2 10*3/uL (ref 0.0–0.5)
Eosinophils Relative: 2 %
HCT: 39.8 % (ref 39.0–52.0)
Hemoglobin: 13.3 g/dL (ref 13.0–17.0)
Immature Granulocytes: 0 %
Lymphocytes Relative: 25 %
Lymphs Abs: 2.6 10*3/uL (ref 0.7–4.0)
MCH: 28.3 pg (ref 26.0–34.0)
MCHC: 33.4 g/dL (ref 30.0–36.0)
MCV: 84.7 fL (ref 80.0–100.0)
Monocytes Absolute: 0.5 10*3/uL (ref 0.1–1.0)
Monocytes Relative: 5 %
Neutro Abs: 6.9 10*3/uL (ref 1.7–7.7)
Neutrophils Relative %: 68 %
Platelets: 119 10*3/uL — ABNORMAL LOW (ref 150–400)
RBC: 4.7 MIL/uL (ref 4.22–5.81)
RDW: 14.6 % (ref 11.5–15.5)
WBC: 10.2 10*3/uL (ref 4.0–10.5)
nRBC: 0 % (ref 0.0–0.2)

## 2022-09-24 LAB — BASIC METABOLIC PANEL
Anion gap: 10 (ref 5–15)
BUN: 9 mg/dL (ref 8–23)
CO2: 31 mmol/L (ref 22–32)
Calcium: 8.1 mg/dL — ABNORMAL LOW (ref 8.9–10.3)
Chloride: 95 mmol/L — ABNORMAL LOW (ref 98–111)
Creatinine, Ser: 0.87 mg/dL (ref 0.61–1.24)
GFR, Estimated: >60 mL/min (ref >60)
Glucose, Bld: 143 mg/dL — ABNORMAL HIGH (ref 70–99)
Potassium: 3.1 mmol/L — ABNORMAL LOW (ref 3.5–5.1)
Sodium: 136 mmol/L (ref 135–145)

## 2022-09-24 MED ORDER — IOHEXOL 350 MG/ML SOLN
75.0000 mL | Freq: Once | INTRAVENOUS | Status: AC | PRN
  Administered 2022-09-24: 75 mL via INTRAVENOUS

## 2022-09-24 MED ORDER — AMOXICILLIN-POT CLAVULANATE 875-125 MG PO TABS: 1.0000 | ORAL_TABLET | Freq: Two times a day (BID) | ORAL | 0 refills | Status: DC

## 2022-09-24 MED ORDER — ACETAMINOPHEN 325 MG PO TABS
650.0000 mg | ORAL_TABLET | Freq: Once | ORAL | Status: AC
  Administered 2022-09-24: 650 mg via ORAL
  Filled 2022-09-24: qty 2

## 2022-09-24 MED ORDER — AMOXICILLIN-POT CLAVULANATE 875-125 MG PO TABS
1.0000 | ORAL_TABLET | Freq: Once | ORAL | Status: AC
  Administered 2022-09-24: 1 via ORAL
  Filled 2022-09-24: qty 1

## 2022-09-24 NOTE — ED Triage Notes (Signed)
Pt reports frontal HA today; sts he has an "aneurysm there"; also c/o urinary frequency

## 2022-09-24 NOTE — ED Provider Notes (Signed)
[place] [facility] Provider Note   CSN: [id number] Arrival date & time: 09/24/22  1712     History  Chief Complaint  Patient presents with   Headache    Dean Cole is a 72 y.o. male.  Patient here with frontal headache for the last 3 hours.  He has had some congestion and cough.  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  He denies any neck pain and chest pain or shortness of breath.  He has headache similar to these in the past.  Denies any numbness, weakness, chills, fever.  No weakness or speech changes or vision changes.  Says the headaches about a 3 now.  The history is provided by the patient.       Home Medications Prior to Admission medications   Medication Sig Start Date End Date Taking? Authorizing Provider  amoxicillin-clavulanate (AUGMENTIN) 875-125 MG tablet Take 1 tablet by mouth every 12 (twelve) hours. 09/24/22  Yes Arabel Barcenas, DO  ammonium lactate (LAC-HYDRIN) 12 % lotion APPLY 1 APPLICATION TOPICALLY AS NEEDED FOR DRY SKIN. 04/07/22   Vivi Barrack, DPM  aspirin EC 81 MG tablet Take 1 tablet (81 mg total) by mouth daily. Swallow whole. 05/26/20   Rollene Rotunda, MD  azithromycin (ZITHROMAX) 250 MG tablet Take 1 tablet (250 mg total) by mouth daily. Take first 2 tablets together, then 1 every day until finished. 07/04/22   Kommor, Madison, MD  clotrimazole (LOTRIMIN) 1 % external solution 1 application 2 (two) times daily as needed (skin irritation). 09/09/20   [provider]  clotrimazole-betamethasone (LOTRISONE) cream APPLY 1 APPLICATION. TOPICALLY 2 (TWO) TIMES DAILY. 10/14/21   Vivi Barrack, DPM  diazepam (VALIUM) 10 MG tablet Take 10 mg by mouth 2 (two) times daily. 09/22/19   [provider]  diphenoxylate-atropine (LOMOTIL) 2.5-0.025 MG tablet Take one tablet before meals as needed for diarrhea 12/07/20   [provider]  donepezil (ARICEPT) 10 MG tablet TAKE 1 TABLET  BY MOUTH EVERYDAY AT BEDTIME 01/29/20   Jaffe, Skylee Baird R, DO  gabapentin (NEURONTIN) 100 MG capsule TAKE 1 CAPSULE BY MOUTH THREE TIMES A DAY 04/07/22   Vivi Barrack, DPM  imipramine (TOFRANIL) 50 MG tablet Take 100 mg by mouth at bedtime.    [provider]  levothyroxine (SYNTHROID) 175 MCG tablet Take 175 mcg by mouth daily before breakfast.  07/08/19   [provider]  memantine (NAMENDA XR) 28 MG CP24 24 hr capsule Take 28 mg by mouth in the morning.    [provider]  metFORMIN (GLUCOPHAGE-XR) 500 MG 24 hr tablet Take 500 mg by mouth daily with breakfast. Patient not taking: Reported on 06/22/2022    [provider]  metoprolol tartrate (LOPRESSOR) 25 MG tablet Take 25 mg by mouth 2 (two) times daily.    [provider]  mupirocin ointment (BACTROBAN) 2 % Apply 1 Application topically 2 (two) times daily. 11/24/21   Vivi Barrack, DPM  NITROSTAT 0.4 MG SL tablet Place 0.4 mg under the tongue every 5 (five) minutes as needed for chest pain. Reported on 10/26/2015 07/06/11   [provider]  pentazocine-naloxone (TALWIN NX) 50-0.5 MG tablet Take 1 tablet by mouth in the morning and at bedtime.    [provider]  phenazopyridine (PYRIDIUM) 95 MG tablet Take 95 mg by mouth 3 (three) times daily as needed (urinary pain relief).    [provider]  simvastatin (ZOCOR) 20  MG tablet TAKE ONE TABLET BY MOUTH ONCE DAILY FOR CHOLESTEROL Patient taking differently: Take 20 mg by mouth every evening. Take one tablet by mouth once daily for cholesterol 10/16/16   Reed, Tiffany L, DO  tamsulosin (FLOMAX) 0.4 MG CAPS capsule Take 0.4 mg by mouth 2 (two) times daily.    [provider]  triamcinolone (KENALOG) 0.025 % ointment APPLY TO AFFECTED AREA TWICE A DAY 10/14/21   Vivi Barrack, DPM      Allergies    Adhesive [tape]    Review of Systems   Review of Systems  Physical Exam Updated Vital Signs BP (!) 151/71    Pulse 88   Temp 98.8 F (37.1 C) (Oral)   Resp 17   Ht 5\' 10"  (1.778 m)   Wt 78 kg   SpO2 96%   BMI 24.68 kg/m  Physical Exam Vitals and nursing note reviewed.  Constitutional:      General: He is not in acute distress.    Appearance: He is well-developed. He is not ill-appearing.  HENT:     Head: Normocephalic and atraumatic.     Mouth/Throat:     Mouth: Mucous membranes are moist.     Pharynx: Oropharynx is clear.  Eyes:     Extraocular Movements: Extraocular movements intact.     Conjunctiva/sclera: Conjunctivae normal.     Pupils: Pupils are equal, round, and reactive to light.  Cardiovascular:     Rate and Rhythm: Normal rate and regular rhythm.     Heart sounds: No murmur heard. Pulmonary:     Effort: Pulmonary effort is normal. No respiratory distress.     Breath sounds: Normal breath sounds.  Abdominal:     Palpations: Abdomen is soft.     Tenderness: There is no abdominal tenderness.  Musculoskeletal:        General: No swelling.     Cervical back: Normal range of motion and neck supple.  Skin:    General: Skin is warm and dry.     Capillary Refill: Capillary refill takes less than 2 seconds.  Neurological:     Mental Status: He is alert and oriented to person, place, and time.     Comments: 5+ out of 5 strength throughout, normal sensation, no drift, normal finger-nose-finger, normal speech  Psychiatric:        Mood and Affect: Mood normal.     ED Results / Procedures / Treatments   Labs (all labs ordered are listed, but only abnormal results are displayed) Labs Reviewed  CBC WITH DIFFERENTIAL/PLATELET - Abnormal; Notable for the following components:      Result Value   Platelets 119 (*)    All other components within normal limits  BASIC METABOLIC PANEL - Abnormal; Notable for the following components:   Potassium 3.1 (*)    Chloride 95 (*)    Glucose, Bld 143 (*)    Calcium 8.1 (*)    All other components within normal limits  URINALYSIS,  ROUTINE W REFLEX MICROSCOPIC - Abnormal; Notable for the following components:   Hgb urine dipstick TRACE (*)    All other components within normal limits  URINALYSIS, MICROSCOPIC (REFLEX) - Abnormal; Notable for the following components:   Bacteria, UA RARE (*)    All other components within normal limits    EKG None  Radiology CT ANGIO HEAD NECK W WO CM  Result Date: 09/24/2022 CLINICAL DATA:  Neuro deficit, acute, stroke suspected. Headache. Known bilateral ICA pseudoaneurysms. EXAM: CT  ANGIOGRAPHY HEAD AND NECK WITH AND WITHOUT CONTRAST TECHNIQUE: Multidetector CT imaging of the head and neck was performed using the standard protocol during bolus administration of intravenous contrast. Multiplanar CT image reconstructions and MIPs were obtained to evaluate the vascular anatomy. Carotid stenosis measurements (when applicable) are obtained utilizing NASCET criteria, using the distal internal carotid diameter as the denominator. RADIATION DOSE REDUCTION: This exam was performed according to the departmental dose-optimization program which includes automated exposure control, adjustment of the mA and/or kV according to patient size and/or use of iterative reconstruction technique. CONTRAST:  75mL OMNIPAQUE IOHEXOL 350 MG/ML SOLN COMPARISON:  Head CT 10/04/2019 and MRI 11/18/2019 FINDINGS: CT HEAD FINDINGS Brain: There is no evidence of an acute infarct, intracranial hemorrhage, mass, midline shift, or extra-axial fluid collection. Cerebral atrophy is unchanged. Vascular: Calcified atherosclerosis at the skull base. Skull: No acute fracture or suspicious osseous lesion. Sinuses/Orbits: Paranasal sinuses and mastoid air cells are clear. Unremarkable orbits. Other: None. Review of the MIP images confirms the above findings CTA NECK FINDINGS Aortic arch: Normal variant aortic arch branching pattern with common origin of the brachiocephalic and left common carotid arteries and with the left vertebral artery  arising directly from the arch. Widely patent arch vessel origins. Right carotid system: Patent with calcified and soft plaque at the carotid bifurcation resulting in less than 50% stenosis of the ICA origin. 12 x 4 mm anteriorly located pseudoaneurysm of the mid cervical ICA. Left carotid system: Patent with minimal calcified and soft plaque at the carotid bifurcation not resulting in stenosis. 7 x 5 mm medially projecting pseudoaneurysm of the mid cervical ICA. Vertebral arteries: Patent with the right being dominant. No evidence of a significant stenosis or dissection. Skeleton: No acute osseous abnormality or suspicious osseous lesion. Mild chronic T1 superior endplate compression fracture. Other neck: No evidence of cervical lymphadenopathy or mass. Upper chest: Partially visualized band like opacity in the right upper lobe likely reflecting atelectasis or scarring. Prior CABG. Review of the MIP images confirms the above findings CTA HEAD FINDINGS Anterior circulation: The internal carotid arteries are patent from skull base to carotid termini with calcified plaque resulting in at most mild siphon stenosis on the right and no stenosis on the left. ACAs and MCAs are patent without evidence of a proximal branch occlusion or significant proximal stenosis. No aneurysm is identified. Posterior circulation: The intracranial vertebral arteries are widely patent to the basilar. Patent PICA and SCA origins are seen bilaterally. The basilar artery is widely patent. There is a large left posterior communicating artery. Both PCAs are patent without evidence of a significant proximal stenosis. No aneurysm is identified. Venous sinuses: Patent. Anatomic variants: None. Review of the MIP images confirms the above findings IMPRESSION: 1. No evidence of acute intracranial abnormality. 2. No large vessel occlusion or flow limiting proximal stenosis in the head or neck. 3. Bilateral mid cervical ICA pseudoaneurysms.  Electronically Signed   By: Sebastian Ache M.D.   On: 09/24/2022 19:42   DG Chest Portable 1 View  Result Date: 09/24/2022 CLINICAL DATA:  Cough frontal H/A today. Ex smoker. Hx pna. Cardiac stents x 6. EXAM: PORTABLE CHEST 1 VIEW COMPARISON:  CT chest 07/04/2022 FINDINGS: Slightly more prominent ascending thoracic aorta due to patient rotation and low lung volumes. The heart and mediastinal contours are within normal limits. Surgical changes of a coronary artery bypass graft overlie the mediastinum. Low lung volumes. Bibasilar atelectasis. No focal consolidation. No pulmonary edema. No pleural effusion. No pneumothorax. No acute  osseous abnormality.  Sternotomy wires are intact. IMPRESSION: Low lung volumes with bibasilar atelectasis. Recommend repeat PA and lateral view of the chest with improved inspiratory effort. Electronically Signed   By: Tish Frederickson M.D.   On: 09/24/2022 18:08    Procedures Procedures    Medications Ordered in ED Medications  amoxicillin-clavulanate (AUGMENTIN) 875-125 MG per tablet 1 tablet (has no administration in time range)  acetaminophen (TYLENOL) tablet 650 mg (650 mg Oral Given 09/24/22 1750)  iohexol (OMNIPAQUE) 350 MG/ML injection 75 mL (75 mLs Intravenous Contrast Given 09/24/22 1814)    ED Course/ Medical Decision Making/ A&P                             Medical Decision Making Amount and/or Complexity of Data Reviewed Labs: ordered. Radiology: ordered.  Risk OTC drugs. Prescription drug management.   Dean Cole is here with headache.  Sounds like some nasal congestion and cough as well.  History of hypertension, CAD, PAD, kidney stones.  Has known bilateral carotid pseudoaneurysms.  Frontal headache about 3 hours ago while at church but he is also has some cough.  Denies any weakness or numbness or vision changes.  Neurologic exam is unremarkable.  Headaches are 3 out of 10 right now.  Differential diagnosis could be migraine/viral type/sinus  type headache.  Given his history we will get a CT of the head and neck to further evaluate for other etiologies of headache and discomfort.  Will get a chest x-ray because has had a cough.  Will check basic labs.  Will give Tylenol for headache as headaches mostly resolved at this time.  He denies any chest pain or shortness of breath or abdominal pain weakness or numbness or tingling.  Per my review and interpretation labs no significant anemia or electrolyte abnormality or kidney injury or leukocytosis.  Chest x-ray without any evidence of pneumonia.  CT of the head and neck unremarkable.  Stable pseudoaneurysms of the ICA.  Overall suspect sinus process.  Will prescribe Augmentin.  Discharged in good condition.  This chart was dictated using voice recognition software.  Despite best efforts to proofread,  errors can occur which can change the documentation meaning.         Final Clinical Impression(s) / ED Diagnoses Final diagnoses:  Sinus headache    Rx / DC Orders ED Discharge Orders          Ordered    amoxicillin-clavulanate (AUGMENTIN) 875-125 MG tablet  Every 12 hours        09/24/22 1953              Virgina Norfolk, DO 09/24/22 1953

## 2022-09-26 NOTE — Progress Notes (Signed)
Subjective: Chief Complaint  Patient presents with   Nail Problem    Nail trim      72 year old male presents the office today with his wife for concerns of thick, elongated toes that he cannot trim himself to both feet.  No open lesions. No recent injuries to the feet.   Objective: AAO x3 DP/PT pulses palpable bilaterally, CRT less than 3 seconds Nails are hypertrophic, dystrophic, brittle, discolored, elongated 10.  Small nail present on bilateral hallux toenails.  Tenderness nails 1-5 bilaterally. No open lesions or pre-ulcerative lesions are identified today. No open lesions. No pain with calf compression, swelling, warmth, erythema  Assessment: 72 year old male symptomatic onychomycosis  Plan: -All treatment options discussed with the patient including all alternatives, risks, complications. -Sharply debrided the nails x10 without any complications or bleeding. -Continue moisturizer as needed. -Daily foot inspection  Return in about 3 months (around 12/22/2022).  Vivi Barrack DPM

## 2022-09-30 ENCOUNTER — Emergency Department (HOSPITAL_COMMUNITY)
Admission: EM | Admit: 2022-09-30 | Discharge: 2022-09-30 | Disposition: A | Discharge status: Home / Self Care | Payer: Medicare HMO | Attending: Student | Admitting: Student

## 2022-09-30 ENCOUNTER — Other Ambulatory Visit: Payer: Self-pay

## 2022-09-30 ENCOUNTER — Encounter (HOSPITAL_COMMUNITY): Payer: Self-pay | Admitting: Emergency Medicine

## 2022-09-30 ENCOUNTER — Emergency Department (HOSPITAL_COMMUNITY): Payer: Medicare HMO

## 2022-09-30 DIAGNOSIS — Z7984 Long term (current) use of oral hypoglycemic drugs: Secondary | ICD-10-CM | POA: Diagnosis not present

## 2022-09-30 DIAGNOSIS — Z85828 Personal history of other malignant neoplasm of skin: Secondary | ICD-10-CM | POA: Diagnosis not present

## 2022-09-30 DIAGNOSIS — Z87891 Personal history of nicotine dependence: Secondary | ICD-10-CM | POA: Insufficient documentation

## 2022-09-30 DIAGNOSIS — I2510 Atherosclerotic heart disease of native coronary artery without angina pectoris: Secondary | ICD-10-CM | POA: Insufficient documentation

## 2022-09-30 DIAGNOSIS — Z79899 Other long term (current) drug therapy: Secondary | ICD-10-CM | POA: Diagnosis not present

## 2022-09-30 DIAGNOSIS — S5001XA Contusion of right elbow, initial encounter: Secondary | ICD-10-CM | POA: Insufficient documentation

## 2022-09-30 DIAGNOSIS — Z8546 Personal history of malignant neoplasm of prostate: Secondary | ICD-10-CM | POA: Diagnosis not present

## 2022-09-30 DIAGNOSIS — E039 Hypothyroidism, unspecified: Secondary | ICD-10-CM | POA: Diagnosis not present

## 2022-09-30 DIAGNOSIS — E1140 Type 2 diabetes mellitus with diabetic neuropathy, unspecified: Secondary | ICD-10-CM | POA: Insufficient documentation

## 2022-09-30 DIAGNOSIS — Z7982 Long term (current) use of aspirin: Secondary | ICD-10-CM | POA: Insufficient documentation

## 2022-09-30 DIAGNOSIS — S59901A Unspecified injury of right elbow, initial encounter: Secondary | ICD-10-CM | POA: Diagnosis present

## 2022-09-30 DIAGNOSIS — W19XXXA Unspecified fall, initial encounter: Secondary | ICD-10-CM | POA: Insufficient documentation

## 2022-09-30 DIAGNOSIS — I10 Essential (primary) hypertension: Secondary | ICD-10-CM | POA: Insufficient documentation

## 2022-09-30 LAB — CBC WITH DIFFERENTIAL/PLATELET
Abs Immature Granulocytes: 0.03 10*3/uL (ref 0.00–0.07)
Basophils Absolute: 0 10*3/uL (ref 0.0–0.1)
Basophils Relative: 0 %
Eosinophils Absolute: 0.1 10*3/uL (ref 0.0–0.5)
Eosinophils Relative: 2 %
HCT: 37.3 % — ABNORMAL LOW (ref 39.0–52.0)
Hemoglobin: 12.6 g/dL — ABNORMAL LOW (ref 13.0–17.0)
Immature Granulocytes: 1 %
Lymphocytes Relative: 32 %
Lymphs Abs: 1.3 10*3/uL (ref 0.7–4.0)
MCH: 28.8 pg (ref 26.0–34.0)
MCHC: 33.8 g/dL (ref 30.0–36.0)
MCV: 85.4 fL (ref 80.0–100.0)
Monocytes Absolute: 0.2 10*3/uL (ref 0.1–1.0)
Monocytes Relative: 5 %
Neutro Abs: 2.6 10*3/uL (ref 1.7–7.7)
Neutrophils Relative %: 60 %
Platelets: 85 10*3/uL — ABNORMAL LOW (ref 150–400)
RBC: 4.37 MIL/uL (ref 4.22–5.81)
RDW: 14.6 % (ref 11.5–15.5)
WBC: 4.3 10*3/uL (ref 4.0–10.5)
nRBC: 0 % (ref 0.0–0.2)

## 2022-09-30 LAB — COMPREHENSIVE METABOLIC PANEL
ALT: 21 U/L (ref 0–44)
AST: 30 U/L (ref 15–41)
Albumin: 3.5 g/dL (ref 3.5–5.0)
Alkaline Phosphatase: 119 U/L (ref 38–126)
Anion gap: 7 (ref 5–15)
BUN: 10 mg/dL (ref 8–23)
CO2: 33 mmol/L — ABNORMAL HIGH (ref 22–32)
Calcium: 8.3 mg/dL — ABNORMAL LOW (ref 8.9–10.3)
Chloride: 96 mmol/L — ABNORMAL LOW (ref 98–111)
Creatinine, Ser: 0.84 mg/dL (ref 0.61–1.24)
GFR, Estimated: >60 mL/min (ref >60)
Glucose, Bld: 244 mg/dL — ABNORMAL HIGH (ref 70–99)
Potassium: 2.9 mmol/L — ABNORMAL LOW (ref 3.5–5.1)
Sodium: 136 mmol/L (ref 135–145)
Total Bilirubin: 0.7 mg/dL (ref 0.3–1.2)
Total Protein: 7.1 g/dL (ref 6.5–8.1)

## 2022-09-30 MED ORDER — LIDOCAINE 5 % EX PTCH
1.0000 | MEDICATED_PATCH | CUTANEOUS | Status: DC
  Administered 2022-09-30: 1 via TRANSDERMAL
  Filled 2022-09-30: qty 1

## 2022-09-30 MED ORDER — MAGNESIUM OXIDE -MG SUPPLEMENT 400 (240 MG) MG PO TABS
800.0000 mg | ORAL_TABLET | Freq: Once | ORAL | Status: AC
  Administered 2022-09-30: 800 mg via ORAL
  Filled 2022-09-30: qty 2

## 2022-09-30 MED ORDER — POTASSIUM CHLORIDE CRYS ER 20 MEQ PO TBCR
40.0000 meq | EXTENDED_RELEASE_TABLET | Freq: Once | ORAL | Status: AC
  Administered 2022-09-30: 40 meq via ORAL
  Filled 2022-09-30: qty 2

## 2022-09-30 MED ORDER — LIDOCAINE 5 % EX PTCH: 1.0000 | MEDICATED_PATCH | CUTANEOUS | 0 refills | Status: AC

## 2022-09-30 NOTE — ED Triage Notes (Signed)
Pt fell at home last night and hit right elbow. He reports pain and several small abrasions and small skin tears are noted on right elbow and bilateral forearms. Pt walks with a cane and wife reports numerous falls at home in recent weeks.

## 2022-10-01 NOTE — ED Provider Notes (Signed)
[place] EMERGENCY DEPARTMENT AT [facility] Provider Note  CSN: [id number] Arrival date & time: 09/30/22 1243  Chief Complaint(s) Fall  HPI Dean Cole is a 72 y.o. male with PMH T2DM, CAD,Shortness of breath shortness of breath HTN, HLD, prostate cancer, PAD, restless leg syndrome, recent CT imaging showing bilateral cervical internal carotid pseudoaneurysms with concern for possible connective tissue disorder and underlying dissections who presents emergency department for evaluation of a fall.  Patient arrives with his wife who states that patient had a mechanical fall last night and fell on his right elbow.  No head strike or loss of consciousness.  He arrives with complaints of right elbow pain but denies numbness, tingling, weakness, visual deficits or other neurologic complaints.  Denies chest pain, shortness of breath, abdominal pain, nausea, vomiting, headache, fever or other systemic complaints.  Denies any additional traumatic complaints today.   Past Medical History Past Medical History:  Diagnosis Date   Anxiety    Basal cell carcinoma    Cholecystitis    Controlled type 2 DM with peripheral circulatory disorder (HCC) 09/20/2014   L LAD, S Diag, S OM, S PL   Coronary artery disease    Depression    Headache(784.0)    HTN (hypertension)    Hx of skin cancer, basal cell    Hyperlipidemia    Hypothyroidism    Kidney stones    Melanoma (HCC)    Neuropathy    PAD (peripheral artery disease) (HCC) 09/20/2014   Prostate cancer (HCC)    Restless leg syndrome    Sepsis [facility])    Patient Active Problem List   Diagnosis Date Noted   Extensive tattoos 12/13/2020   Multiple body piercings 12/13/2020   Ataxia 12/07/2020   Diarrhea 12/07/2020   Pierced tongue 08/29/2020   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   Chronic diarrhea 01/15/2020   Acute metabolic encephalopathy 10/05/2019   Urinary urgency 10/05/2019   Hyperglycemia due to diabetes  mellitus (HCC) 10/05/2019   Educated about COVID-19 virus infection 08/05/2019   Balance problem 07/08/2019   Frequent falls 07/08/2019   Poor appetite 07/08/2019   Acute kidney failure (HCC) 07/07/2019   AMS (altered mental status) 07/07/2019   Postural dizziness 07/07/2019   Hypocalcemia 07/07/2019   Sepsis (HCC) 07/07/2019   Colitis 07/07/2019   N&V (nausea and vomiting) 07/06/2019   Lung nodule 07/06/2019   Fever 07/06/2019   Chronic pain syndrome 05/29/2019   Malignant neoplasm of prostate (HCC) 07/23/2018   Cellulitis of toe 05/23/2018   Acquired deformity of toenail 05/15/2018   Foot callus 04/10/2018   Long toenail 04/10/2018   Encounter for Medicare annual wellness exam 12/19/2017   Ureteral stone with hydronephrosis 08/04/2017   Anxiety, generalized 03/19/2017   Essential hypertension 03/19/2017   Moderate episode of recurrent major depressive disorder (HCC) 03/19/2017   Sensorineural hearing loss (SNHL) of both ears 03/19/2017   Hearing loss 04/19/2016   PAD (peripheral artery disease) (HCC) 09/20/2014   Controlled type 2 DM with peripheral circulatory disorder (HCC) 09/20/2014   Depression 09/16/2014   Erectile dysfunction 07/02/2014   Type II diabetes mellitus with renal manifestations (HCC) 03/04/2014   Eczema 12/25/2012   Anxiety    Dyslipidemia 02/29/2012   Hypertensive heart disease without CHF    Hypothyroidism    Coronary artery disease    Basal cell carcinoma    Headache    Restless leg syndrome    Melanoma of skin (HCC) 06/14/2001   Home Medication(s) Prior to  Admission medications   Medication Sig Start Date End Date Taking? Authorizing Provider  lidocaine (LIDODERM) 5 % Place 1 patch onto the skin daily. Remove & Discard patch within 12 hours or as directed by MD 09/30/22  Yes Stevan Eberwein, MD  ammonium lactate (LAC-HYDRIN) 12 % lotion APPLY 1 APPLICATION TOPICALLY AS NEEDED FOR DRY SKIN. 04/07/22   Vivi Barrack, DPM   amoxicillin-clavulanate (AUGMENTIN) 875-125 MG tablet Take 1 tablet by mouth every 12 (twelve) hours. 09/24/22   Curatolo, Adam, DO  aspirin EC 81 MG tablet Take 1 tablet (81 mg total) by mouth daily. Swallow whole. 05/26/20   Rollene Rotunda, MD  azithromycin (ZITHROMAX) 250 MG tablet Take 1 tablet (250 mg total) by mouth daily. Take first 2 tablets together, then 1 every day until finished. 07/04/22   Jaquaya Coyle, MD  clotrimazole (LOTRIMIN) 1 % external solution 1 application 2 (two) times daily as needed (skin irritation). 09/09/20   [provider]  clotrimazole-betamethasone (LOTRISONE) cream APPLY 1 APPLICATION. TOPICALLY 2 (TWO) TIMES DAILY. 10/14/21   Vivi Barrack, DPM  diazepam (VALIUM) 10 MG tablet Take 10 mg by mouth 2 (two) times daily. 09/22/19   [provider]  diphenoxylate-atropine (LOMOTIL) 2.5-0.025 MG tablet Take one tablet before meals as needed for diarrhea 12/07/20   [provider]  donepezil (ARICEPT) 10 MG tablet TAKE 1 TABLET BY MOUTH EVERYDAY AT BEDTIME 01/29/20   Jaffe, Adam R, DO  gabapentin (NEURONTIN) 100 MG capsule TAKE 1 CAPSULE BY MOUTH THREE TIMES A DAY 04/07/22   Vivi Barrack, DPM  imipramine (TOFRANIL) 50 MG tablet Take 100 mg by mouth at bedtime.    [provider]  levothyroxine (SYNTHROID) 175 MCG tablet Take 175 mcg by mouth daily before breakfast.  07/08/19   [provider]  memantine (NAMENDA XR) 28 MG CP24 24 hr capsule Take 28 mg by mouth in the morning.    [provider]  metFORMIN (GLUCOPHAGE-XR) 500 MG 24 hr tablet Take 500 mg by mouth daily with breakfast. Patient not taking: Reported on 06/22/2022    [provider]  metoprolol tartrate (LOPRESSOR) 25 MG tablet Take 25 mg by mouth 2 (two) times daily.    [provider]  mupirocin ointment (BACTROBAN) 2 % Apply 1 Application topically 2 (two) times daily. 11/24/21   Vivi Barrack, DPM  NITROSTAT 0.4 MG SL tablet Place  0.4 mg under the tongue every 5 (five) minutes as needed for chest pain. Reported on 10/26/2015 07/06/11   [provider]  pentazocine-naloxone (TALWIN NX) 50-0.5 MG tablet Take 1 tablet by mouth in the morning and at bedtime.    [provider]  phenazopyridine (PYRIDIUM) 95 MG tablet Take 95 mg by mouth 3 (three) times daily as needed (urinary pain relief).    [provider]  simvastatin (ZOCOR) 20 MG tablet TAKE ONE TABLET BY MOUTH ONCE DAILY FOR CHOLESTEROL Patient taking differently: Take 20 mg by mouth every evening. Take one tablet by mouth once daily for cholesterol 10/16/16   Reed, Tiffany L, DO  tamsulosin (FLOMAX) 0.4 MG CAPS capsule Take 0.4 mg by mouth 2 (two) times daily.    [provider]  triamcinolone (KENALOG) 0.025 % ointment APPLY TO AFFECTED AREA TWICE A DAY 10/14/21   Vivi Barrack, DPM  Past Surgical History Past Surgical History:  Procedure Laterality Date   BASAL CELL CARCINOMA EXCISION  2000   Dr,Arkin    CARDIAC CATHETERIZATION  07/07/2011   CHOLECYSTECTOMY  09/14/2011   Procedure: LAPAROSCOPIC CHOLECYSTECTOMY WITH INTRAOPERATIVE CHOLANGIOGRAM;  Surgeon: Cherylynn Ridges, MD;  Location: [facility] OR;  Service: General;  Laterality: N/A;   CORONARY ARTERY BYPASS GRAFT  07/10/2011   Procedure: CORONARY ARTERY BYPASS GRAFTING (CABG);  Surgeon: Kerin Perna, MD;  Location: [facility] OR;  Service: Open Heart Surgery;  Laterality: N/A;   EXCISIONAL HEMORRHOIDECTOMY  1997   HEMORROIDECTOMY  2000   LEFT HEART CATHETERIZATION WITH CORONARY ANGIOGRAM N/A 07/07/2011   Procedure: LEFT HEART CATHETERIZATION WITH CORONARY ANGIOGRAM;  Surgeon: Kathleene Hazel, MD;  Location[facility] CATH LAB;  Service: Cardiovascular;  Laterality: N/A;   Family History Family History  Problem Relation Age of Onset   Coronary artery disease Father     Prostate cancer Father    Diabetes Father    Leukemia Mother    Suicidality Brother    Drug abuse Brother    Coronary artery disease Paternal Grandfather    Breast cancer Neg Hx    Colon cancer Neg Hx    Pancreatic cancer Neg Hx     Social History Social History   Tobacco Use   Smoking status: Former    Packs/day: 1.00    Years: 21.00    Additional pack years: 0.00    Total pack years: 21.00    Types: Cigarettes    Quit date: 08/08/1981    Years since quitting: 41.1   Smokeless tobacco: Former  Building services engineer Use: Never used  Substance Use Topics   Alcohol use: No    Comment: former   Drug use: No   Allergies Adhesive [tape]  Review of Systems Review of Systems  Musculoskeletal:  Positive for arthralgias.    Physical Exam Vital Signs  I have reviewed the triage vital signs BP (!) 165/98 (BP Location: Left Arm)   Pulse 64   Temp 97.8 F (36.6 C) (Oral)   Resp 16   Ht 5\' 10"  (1.778 m)   Wt 78 kg   SpO2 94%   BMI 24.68 kg/m   Physical Exam Constitutional:      General: He is not in acute distress.    Appearance: Normal appearance.  HENT:     Head: Normocephalic and atraumatic.     Nose: No congestion or rhinorrhea.  Eyes:     General:        Right eye: No discharge.        Left eye: No discharge.     Extraocular Movements: Extraocular movements intact.     Pupils: Pupils are equal, round, and reactive to light.  Cardiovascular:     Rate and Rhythm: Normal rate and regular rhythm.     Heart sounds: No murmur heard. Pulmonary:     Effort: No respiratory distress.     Breath sounds: No wheezing or rales.  Abdominal:     General: There is no distension.     Tenderness: There is no abdominal tenderness.  Musculoskeletal:        General: Tenderness present. Normal range of motion.     Cervical back: Normal range of motion.  Skin:    General: Skin is warm and dry.  Neurological:     General: No focal deficit present.     Mental Status:  He is alert.  ED Results and Treatments Labs (all labs ordered are listed, but only abnormal results are displayed) Labs Reviewed  COMPREHENSIVE METABOLIC PANEL - Abnormal; Notable for the following components:      Result Value   Potassium 2.9 (*)    Chloride 96 (*)    CO2 33 (*)    Glucose, Bld 244 (*)    Calcium 8.3 (*)    All other components within normal limits  CBC WITH DIFFERENTIAL/PLATELET - Abnormal; Notable for the following components:   Hemoglobin 12.6 (*)    HCT 37.3 (*)    Platelets 85 (*)    All other components within normal limits                                                                                                                          Radiology DG Elbow Complete Right  Result Date: 09/30/2022 CLINICAL DATA:  Fall EXAM: RIGHT ELBOW - COMPLETE 3 VIEW COMPARISON:  None Available. FINDINGS: There is no evidence of fracture, dislocation, or joint effusion. Mild degenerative changes of the right elbow joint. Soft tissues are unremarkable. IMPRESSION: 1. No acute fracture or dislocation. 2. Mild degenerative changes of the right elbow joint. Electronically Signed   By: Allegra Lai M.D.   On: 09/30/2022 13:17    Pertinent labs & imaging results that were available during my care of the patient were reviewed by me and considered in my medical decision making (see MDM for details).  Medications Ordered in ED Medications  potassium chloride SA (KLOR-CON M) CR tablet 40 mEq (40 mEq Oral Given 09/30/22 1448)  magnesium oxide (MAG-OX) tablet 800 mg (800 mg Oral Given 09/30/22 1448)                                                                                                                                     Procedures Procedures  (including critical care time)  Medical Decision Making / ED Course   This patient presents to the ED for concern of fall, elbow pain this involves an extensive number of treatment options, and is a complaint that carries  with it a high risk of complications and morbidity.  The differential diagnosis includes fracture, hematoma, abrasion, contusion, ligamentous injury, dislocation, electrolyte abnormality  MDM: Patient seen emergency room for evaluation of a fall with right elbow pain.  Physical exam with no additional external signs of trauma  outside of some skin tears on the right elbow with tenderness over the brachial radialis on the right.  Neurologic exam is unremarkable with no focal motor or sensory deficits.  Laboratory evaluation with a hemoglobin of 12.6, platelet count 85 which is slightly downtrending from his last platelet count of 119 and there is evidence of some bruising at the site of the fall.  Patient is also mildly hypokalemic to 2.9 which was repleted here in the emergency department.  X-ray of the elbow reassuringly negative for acute traumatic injury.  With history of pseudoaneurysm in the neck and fall last night with thrombocytopenia, I did have an extended discussion with the patient about advanced imaging of the head and neck.  This CT scanner is currently not functional here in our emergency department today and we had a discussion about risks and benefits of potentially missing acute pathology in the head or neck but given his overall normal physical exam today with a normal neurologic exam and reports of no head strike or loss of consciousness, we decided to forego transfer to another facility for advanced imaging and instead opt for strict return precautions and outpatient follow-up.  Strict return precautions were discussed with the patient and his wife and he was discharged with outpatient follow-up.  Additional history obtained: -Additional history obtained from wife -External records from outside source obtained and reviewed including: Chart review including previous notes, labs, imaging, consultation notes   Lab Tests: -I ordered, reviewed, and interpreted labs.   The pertinent results  include:   Labs Reviewed  COMPREHENSIVE METABOLIC PANEL - Abnormal; Notable for the following components:      Result Value   Potassium 2.9 (*)    Chloride 96 (*)    CO2 33 (*)    Glucose, Bld 244 (*)    Calcium 8.3 (*)    All other components within normal limits  CBC WITH DIFFERENTIAL/PLATELET - Abnormal; Notable for the following components:   Hemoglobin 12.6 (*)    HCT 37.3 (*)    Platelets 85 (*)    All other components within normal limits      Imaging Studies ordered: I ordered imaging studies including x-ray elbow I independently visualized and interpreted imaging. I agree with the radiologist interpretation   Medicines ordered and prescription drug management: Meds ordered this encounter  Medications   DISCONTD: lidocaine (LIDODERM) 5 % 1 patch   potassium chloride SA (KLOR-CON M) CR tablet 40 mEq   magnesium oxide (MAG-OX) tablet 800 mg   lidocaine (LIDODERM) 5 %    Sig: Place 1 patch onto the skin daily. Remove & Discard patch within 12 hours or as directed by MD    Dispense:  30 patch    Refill:  0    -I have reviewed the patients home medicines and have made adjustments as needed  Critical interventions none    Cardiac Monitoring: The patient was maintained on a cardiac monitor.  I personally viewed and interpreted the cardiac monitored which showed an underlying rhythm of: NSR  Social Determinants of Health:  Factors impacting patients care include: none   Reevaluation: After the interventions noted above, I reevaluated the patient and found that they have :improved  Co morbidities that complicate the patient evaluation  Past Medical History:  Diagnosis Date   Anxiety    Basal cell carcinoma    Cholecystitis    Controlled type 2 DM with peripheral circulatory disorder (HCC) 09/20/2014   L LAD, S Diag, S OM,  S PL   Coronary artery disease    Depression    Headache(784.0)    HTN (hypertension)    Hx of skin cancer, basal cell     Hyperlipidemia    Hypothyroidism    Kidney stones    Melanoma (HCC)    Neuropathy    PAD (peripheral artery disease) (HCC) 09/20/2014   Prostate cancer (HCC)    Restless leg syndrome    Sepsis (HCC)       Dispostion: I considered admission for this patient, but at this time he does not meet inpatient criteria and using shared decision-making, we will opt for close outpatient follow-up and strict return precautions of which the patient and his wife voiced understanding     Final Clinical Impression(s) / ED Diagnoses Final diagnoses:  Fall, initial encounter  Contusion of right elbow, initial encounter     @PCDICTATION @    Glendora Score, MD 10/01/22 [phone]

## 2022-10-20 ENCOUNTER — Emergency Department (HOSPITAL_COMMUNITY)
Admission: EM | Admit: 2022-10-20 | Discharge: 2022-10-20 | Discharge status: Against Medical Advice | Payer: Medicare HMO | Attending: Emergency Medicine | Admitting: Emergency Medicine

## 2022-10-20 ENCOUNTER — Emergency Department (HOSPITAL_COMMUNITY): Payer: Medicare HMO

## 2022-10-20 ENCOUNTER — Other Ambulatory Visit: Payer: Self-pay

## 2022-10-20 DIAGNOSIS — W01198A Fall on same level from slipping, tripping and stumbling with subsequent striking against other object, initial encounter: Secondary | ICD-10-CM | POA: Insufficient documentation

## 2022-10-20 DIAGNOSIS — R059 Cough, unspecified: Secondary | ICD-10-CM | POA: Insufficient documentation

## 2022-10-20 DIAGNOSIS — S301XXA Contusion of abdominal wall, initial encounter: Secondary | ICD-10-CM | POA: Insufficient documentation

## 2022-10-20 DIAGNOSIS — E039 Hypothyroidism, unspecified: Secondary | ICD-10-CM | POA: Diagnosis not present

## 2022-10-20 DIAGNOSIS — I119 Hypertensive heart disease without heart failure: Secondary | ICD-10-CM | POA: Diagnosis not present

## 2022-10-20 DIAGNOSIS — I2510 Atherosclerotic heart disease of native coronary artery without angina pectoris: Secondary | ICD-10-CM | POA: Diagnosis not present

## 2022-10-20 DIAGNOSIS — E119 Type 2 diabetes mellitus without complications: Secondary | ICD-10-CM | POA: Diagnosis not present

## 2022-10-20 DIAGNOSIS — Z7982 Long term (current) use of aspirin: Secondary | ICD-10-CM | POA: Insufficient documentation

## 2022-10-20 DIAGNOSIS — Z7984 Long term (current) use of oral hypoglycemic drugs: Secondary | ICD-10-CM | POA: Diagnosis not present

## 2022-10-20 DIAGNOSIS — S299XXA Unspecified injury of thorax, initial encounter: Secondary | ICD-10-CM | POA: Diagnosis present

## 2022-10-20 DIAGNOSIS — S20212A Contusion of left front wall of thorax, initial encounter: Secondary | ICD-10-CM

## 2022-10-20 DIAGNOSIS — R531 Weakness: Secondary | ICD-10-CM | POA: Diagnosis not present

## 2022-10-20 DIAGNOSIS — Z79899 Other long term (current) drug therapy: Secondary | ICD-10-CM | POA: Diagnosis not present

## 2022-10-20 DIAGNOSIS — Z8546 Personal history of malignant neoplasm of prostate: Secondary | ICD-10-CM | POA: Insufficient documentation

## 2022-10-20 LAB — BASIC METABOLIC PANEL
Anion gap: 13 (ref 5–15)
BUN: 11 mg/dL (ref 8–23)
CO2: 29 mmol/L (ref 22–32)
Calcium: 9.1 mg/dL (ref 8.9–10.3)
Chloride: 96 mmol/L — ABNORMAL LOW (ref 98–111)
Creatinine, Ser: 1.09 mg/dL (ref 0.61–1.24)
GFR, Estimated: >60 mL/min (ref >60)
Glucose, Bld: 176 mg/dL — ABNORMAL HIGH (ref 70–99)
Potassium: 4.7 mmol/L (ref 3.5–5.1)
Sodium: 138 mmol/L (ref 135–145)

## 2022-10-20 LAB — CBC
HCT: 44.2 % (ref 39.0–52.0)
Hemoglobin: 14.5 g/dL (ref 13.0–17.0)
MCH: 29 pg (ref 26.0–34.0)
MCHC: 32.8 g/dL (ref 30.0–36.0)
MCV: 88.4 fL (ref 80.0–100.0)
Platelets: 108 10*3/uL — ABNORMAL LOW (ref 150–400)
RBC: 5 MIL/uL (ref 4.22–5.81)
RDW: 14.2 % (ref 11.5–15.5)
WBC: 13.8 10*3/uL — ABNORMAL HIGH (ref 4.0–10.5)
nRBC: 0 % (ref 0.0–0.2)

## 2022-10-20 LAB — TYPE AND SCREEN
ABO/RH(D): A POS
Antibody Screen: NEGATIVE

## 2022-10-20 LAB — LACTIC ACID, PLASMA: Lactic Acid, Venous: 2.2 mmol/L (ref 0.5–1.9)

## 2022-10-20 MED ORDER — SODIUM CHLORIDE 0.9 % IV BOLUS (SEPSIS)
500.0000 mL | Freq: Once | INTRAVENOUS | Status: AC
  Administered 2022-10-20: 500 mL via INTRAVENOUS

## 2022-10-20 MED ORDER — SODIUM CHLORIDE 0.9 % IV SOLN
1000.0000 mL | INTRAVENOUS | Status: DC
  Administered 2022-10-20: 1000 mL via INTRAVENOUS

## 2022-10-20 NOTE — Discharge Instructions (Signed)
I recommended doing the CT scan today to make sure there were no serious injuries not noted on the chest x-ray.  We also wanted to make sure you are not having any issues that would affect your oxygen level.  Return to the emergency room if you change your mind and certainly if you start having any worsening symptoms.

## 2022-10-20 NOTE — ED Triage Notes (Signed)
Patient via EMS from PCP office, presented there because he thought he had a low grade fever and felt weaker than usual. SpO2 noted to be 88% on room air. Patient fell two days ago, hitting L back/flank on coffee table with significant bruising noted to same. EMS reports minimal air movement in that  lung field.

## 2022-10-20 NOTE — ED Notes (Addendum)
Pt visitor stating he is anxious about CT results, stating he will "start taking shit off" if he doesn't hear soon. RN notified

## 2022-10-20 NOTE — ED Notes (Signed)
Pt refused blood draw, nurse aware.

## 2022-10-20 NOTE — ED Provider Notes (Signed)
[place] EMERGENCY DEPARTMENT AT [facility] Provider Note   CSN: [id number] Arrival date & time: 10/20/22  1637     History  Chief Complaint  Patient presents with   Weakness   Fall    Dean Cole is a 72 y.o. male.   Weakness Fall     Patient has a history of hypertensive heart disease hypothyroidism, coronary artery disease, diabetes, peripheral artery disease, melanoma, restless leg syndrome, prostate cancer, anxiety, sepsis, colitis, chronic pain syndrome, gait instability, frequent falls who presents to the ER for evaluation after a fall a couple days ago.  Patient states he stumbled hitting his left flank back area on the coffee table.  Patient states he had some generalized malaise since then.  He has been coughing a little bit.  Is not sure if he has had any fevers.  He has helped some generalized weakness but is still able to get around and walk with some assistance.  Patient does not feel that is any different than usual.  He went to his primary care doctor's office today for visit.  While he was there they noted oxygen saturation of 88% on room air low-grade fever and he was weaker than usual.  Patient was sent to the ED for further evaluation  Home Medications Prior to Admission medications   Medication Sig Start Date End Date Taking? Authorizing Provider  ammonium lactate (LAC-HYDRIN) 12 % lotion APPLY 1 APPLICATION TOPICALLY AS NEEDED FOR DRY SKIN. 04/07/22   Vivi Barrack, DPM  amoxicillin-clavulanate (AUGMENTIN) 875-125 MG tablet Take 1 tablet by mouth every 12 (twelve) hours. 09/24/22   Curatolo, Adam, DO  aspirin EC 81 MG tablet Take 1 tablet (81 mg total) by mouth daily. Swallow whole. 05/26/20   Rollene Rotunda, MD  azithromycin (ZITHROMAX) 250 MG tablet Take 1 tablet (250 mg total) by mouth daily. Take first 2 tablets together, then 1 every day until finished. 07/04/22   Kommor, Madison, MD  clotrimazole (LOTRIMIN) 1 % external solution 1  application 2 (two) times daily as needed (skin irritation). 09/09/20   [provider]  clotrimazole-betamethasone (LOTRISONE) cream APPLY 1 APPLICATION. TOPICALLY 2 (TWO) TIMES DAILY. 10/14/21   Vivi Barrack, DPM  diazepam (VALIUM) 10 MG tablet Take 10 mg by mouth 2 (two) times daily. 09/22/19   [provider]  diphenoxylate-atropine (LOMOTIL) 2.5-0.025 MG tablet Take one tablet before meals as needed for diarrhea 12/07/20   [provider]  donepezil (ARICEPT) 10 MG tablet TAKE 1 TABLET BY MOUTH EVERYDAY AT BEDTIME 01/29/20   Jaffe, Adam R, DO  gabapentin (NEURONTIN) 100 MG capsule TAKE 1 CAPSULE BY MOUTH THREE TIMES A DAY 04/07/22   Vivi Barrack, DPM  imipramine (TOFRANIL) 50 MG tablet Take 100 mg by mouth at bedtime.    [provider]  levothyroxine (SYNTHROID) 175 MCG tablet Take 175 mcg by mouth daily before breakfast.  07/08/19   [provider]  lidocaine (LIDODERM) 5 % Place 1 patch onto the skin daily. Remove & Discard patch within 12 hours or as directed by MD 09/30/22   Kommor, Wyn Forster, MD  memantine (NAMENDA XR) 28 MG CP24 24 hr capsule Take 28 mg by mouth in the morning.    [provider]  metFORMIN (GLUCOPHAGE-XR) 500 MG 24 hr tablet Take 500 mg by mouth daily with breakfast. Patient not taking: Reported on 06/22/2022    [provider]  metoprolol tartrate (LOPRESSOR) 25 MG tablet Take 25 mg by mouth  2 (two) times daily.    [provider]  mupirocin ointment (BACTROBAN) 2 % Apply 1 Application topically 2 (two) times daily. 11/24/21   Vivi Barrack, DPM  NITROSTAT 0.4 MG SL tablet Place 0.4 mg under the tongue every 5 (five) minutes as needed for chest pain. Reported on 10/26/2015 07/06/11   [provider]  pentazocine-naloxone (TALWIN NX) 50-0.5 MG tablet Take 1 tablet by mouth in the morning and at bedtime.    [provider]  phenazopyridine (PYRIDIUM) 95 MG tablet Take 95 mg by mouth 3  (three) times daily as needed (urinary pain relief).    [provider]  simvastatin (ZOCOR) 20 MG tablet TAKE ONE TABLET BY MOUTH ONCE DAILY FOR CHOLESTEROL Patient taking differently: Take 20 mg by mouth every evening. Take one tablet by mouth once daily for cholesterol 10/16/16   Reed, Tiffany L, DO  tamsulosin (FLOMAX) 0.4 MG CAPS capsule Take 0.4 mg by mouth 2 (two) times daily.    [provider]  triamcinolone (KENALOG) 0.025 % ointment APPLY TO AFFECTED AREA TWICE A DAY 10/14/21   Vivi Barrack, DPM      Allergies    Adhesive [tape]    Review of Systems   Review of Systems  Neurological:  Positive for weakness.    Physical Exam Updated Vital Signs BP 138/76   Pulse 79   Temp 98.2 F (36.8 C) (Temporal)   Resp (!) 26   SpO2 90%  Physical Exam Vitals and nursing note reviewed.  Constitutional:      General: He is not in acute distress.    Appearance: He is well-developed.  HENT:     Head: Normocephalic and atraumatic.     Right Ear: External ear normal.     Left Ear: External ear normal.  Eyes:     General: No scleral icterus.       Right eye: No discharge.        Left eye: No discharge.     Conjunctiva/sclera: Conjunctivae normal.  Neck:     Trachea: No tracheal deviation.  Cardiovascular:     Rate and Rhythm: Normal rate and regular rhythm.  Pulmonary:     Effort: Pulmonary effort is normal. No respiratory distress.     Breath sounds: No stridor. Examination of the left-lower field reveals decreased breath sounds. Decreased breath sounds present. No wheezing or rales.  Chest:     Comments: Bruising noted left flank Abdominal:     General: Bowel sounds are normal. There is no distension.     Palpations: Abdomen is soft.     Tenderness: There is no abdominal tenderness. There is no guarding or rebound.  Musculoskeletal:        General: No tenderness or deformity.     Cervical back: Neck supple.  Skin:    General: Skin is warm and dry.      Findings: No rash.  Neurological:     General: No focal deficit present.     Mental Status: He is alert.     Cranial Nerves: No cranial nerve deficit, dysarthria or facial asymmetry.     Sensory: No sensory deficit.     Motor: No abnormal muscle tone or seizure activity.     Coordination: Coordination normal.     Comments: Equal grip strength bilaterally, normal plantarflexion bilaterally, normal sensation throughout the patient is able to sit up in bed without difficulty  Psychiatric:        Mood and  Affect: Mood normal.     ED Results / Procedures / Treatments   Labs (all labs ordered are listed, but only abnormal results are displayed) Labs Reviewed  CBC - Abnormal; Notable for the following components:      Result Value   WBC 13.8 (*)    Platelets 108 (*)    All other components within normal limits  LACTIC ACID, PLASMA - Abnormal; Notable for the following components:   Lactic Acid, Venous 2.2 (*)    All other components within normal limits  BASIC METABOLIC PANEL - Abnormal; Notable for the following components:   Chloride 96 (*)    Glucose, Bld 176 (*)    All other components within normal limits  LACTIC ACID, PLASMA  TYPE AND SCREEN    EKG EKG Interpretation  Date/Time:  Friday October 20 2022 16:46:49 EDT Ventricular Rate:  98 PR Interval:  174 QRS Duration: 105 QT Interval:  355 QTC Calculation: 454 R Axis:   17 Text Interpretation: Sinus rhythm Since last tracing rate slower Confirmed by Linwood Dibbles [phone]) on 10/20/2022 4:58:27 PM  Radiology DG Chest Portable 1 View  Result Date: 10/20/2022 CLINICAL DATA:  Weakness rib injury EXAM: PORTABLE CHEST 1 VIEW COMPARISON:  09/24/2022 FINDINGS: Low lung volumes. Streaky atelectasis or scarring at the bases. No pleural effusion or pneumothorax. Post sternotomy changes IMPRESSION: Low lung volumes with streaky atelectasis or scarring at the bases. Electronically Signed   By: Jasmine Pang M.D.   On: 10/20/2022 17:33     Procedures Procedures    Medications Ordered in ED Medications  sodium chloride 0.9 % bolus 500 mL (0 mLs Intravenous Stopped 10/20/22 1743)    Followed by  0.9 %  sodium chloride infusion (1,000 mLs Intravenous New Bag/Given 10/20/22 1742)    ED Course/ Medical Decision Making/ A&P Clinical Course as of 10/20/22 1944  Fri Oct 20, 2022  1747 Cx with low lung volumes  [JK]    Clinical Course User Index [JK] Linwood Dibbles, MD                             Medical Decision Making Problems Addressed: Contusion of rib on left side, initial encounter: acute illness or injury that poses a threat to life or bodily functions  Amount and/or Complexity of Data Reviewed Labs: ordered. Decision-making details documented in ED Course. Radiology: ordered and independent interpretation performed.  Risk Prescription drug management.   Patient presented to the emergency room for evaluation of decreased oxygen level noted at the doctor's office.  Here in the ED his oxygen levels generally in the low to mid 90s without any supplemental oxygen.  Family states this has been an issue in the past and there is been some discussion about home oxygen but the patient has never required it.  His ED workup does not show any signs of anemia.  No definite signs of pneumonia.  Patient's lactic acid level was slightly elevated but he does not have a fever here.  I was concerned about the possibility of underlying infection as well as possible pulmonary embolism or pulmonary contusion not noted on his x-ray.  I ordered a CT angiogram for further evaluation.  Patient initially agreed to that.  However at this time he states he does not want to wait any longer and he wants to leave.  I explained to the patient and his family member of my concerns.  They understand and still  want to go home.  Patient was able to ambulate without difficulty.  His oxygen saturation is in the mid 90s.  Explained to them that he can return at  any time for further evaluation.  Patient will be leaving AMA        Final Clinical Impression(s) / ED Diagnoses Final diagnoses:  Contusion of rib on left side, initial encounter    Rx / DC Orders ED Discharge Orders     None         Linwood Dibbles, MD 10/20/22 1946

## 2022-10-20 NOTE — ED Notes (Signed)
Patient refused CT angio chest , EDP notified .

## 2022-10-20 NOTE — ED Notes (Signed)
Pt ambulated without SOB but O2 did drop to 89% when walking.  Pt refuses to stay and get tests recommended.

## 2022-11-30 ENCOUNTER — Ambulatory Visit (INDEPENDENT_AMBULATORY_CARE_PROVIDER_SITE_OTHER): Payer: Medicare HMO | Admitting: Podiatry

## 2022-11-30 DIAGNOSIS — B351 Tinea unguium: Secondary | ICD-10-CM | POA: Diagnosis not present

## 2022-11-30 DIAGNOSIS — M79674 Pain in right toe(s): Secondary | ICD-10-CM

## 2022-11-30 DIAGNOSIS — M79675 Pain in left toe(s): Secondary | ICD-10-CM | POA: Diagnosis not present

## 2022-12-01 NOTE — Progress Notes (Signed)
Subjective: Chief Complaint  Patient presents with   Diabetes    DFC     72 year old male presents the office today with his wife for concerns of thick, elongated toes that he cannot trim himself to both feet.  No open lesions. No other lower extremity concerns.   Objective: AAO x3 DP/PT pulses palpable bilaterally, CRT less than 3 seconds Nails are hypertrophic, dystrophic, brittle, discolored, elongated 10.  Small nail present on bilateral hallux toenails.  Tenderness nails 1-5 bilaterally. No open lesions or pre-ulcerative lesions are identified today. No open lesions. No pain with calf compression, swelling, warmth, erythema  Assessment: 72 year old male symptomatic onychomycosis  Plan: -All treatment options discussed with the patient including all alternatives, risks, complications. -Sharply debrided the nails x10 without any complications or bleeding. -Continue moisturizer as needed. -Daily foot inspection  Return in about 3 months (around 03/02/2023) for nail trim .  Vivi Barrack DPM

## 2022-12-14 ENCOUNTER — Ambulatory Visit (INDEPENDENT_AMBULATORY_CARE_PROVIDER_SITE_OTHER): Payer: Medicare HMO

## 2022-12-14 ENCOUNTER — Ambulatory Visit: Payer: Medicare HMO | Admitting: Podiatry

## 2022-12-14 ENCOUNTER — Encounter: Payer: Self-pay | Admitting: Podiatry

## 2022-12-14 DIAGNOSIS — S9001XA Contusion of right ankle, initial encounter: Secondary | ICD-10-CM | POA: Diagnosis not present

## 2022-12-14 DIAGNOSIS — L03115 Cellulitis of right lower limb: Secondary | ICD-10-CM | POA: Diagnosis not present

## 2022-12-14 MED ORDER — CEPHALEXIN 500 MG PO CAPS: 500.0000 mg | ORAL_CAPSULE | Freq: Four times a day (QID) | ORAL | 0 refills | Status: AC

## 2022-12-14 NOTE — Progress Notes (Signed)
  Subjective:  Patient ID: Dean Cole, male    DOB: 1951/01/18,  MRN: [id number]  Chief Complaint  Patient presents with   Foot Pain    "He has this place on his leg.  Last night, I noticed that his foot is swollen, red, and hot." N - swelling L - right lateral ankle and foot D - last night O - suddenly, gotten worse C - swelling, redness, hot, sore A - none T - elevation    72 y.o. male presents with the above complaint. History confirmed with patient.   Objective:  Physical Exam: warm, good capillary refill, normal DP and PT pulses, and bilaterally he has mild venous stasis, there is cellulitis in the ankle and midfoot of the right lower extremity, no open lesions in the foot but does have a small scab in the mid leg, negative Homan and Pratt sign.     Radiographs: Multiple views x-ray of right ankle: No acute osseous abnormalities Assessment:   1. Hematoma of right ankle   2. Cellulitis of right ankle      Plan:  Patient was evaluated and treated and all questions answered.  Has developed recurrent cellulitis on his right lower extremity again.  Rx cephalexin 500 mg 4 times daily sent to pharmacy.  Take this for 2 weeks and follow-up at the end of this to reevaluate further.  Advised if worsening to proceed to the emergency room for evaluation and treatment.  There is no evidence of DVT clinically that would necessitate an ultrasound at this point but advised on signs and symptoms of worsening.  Return in about 2 weeks (around 12/28/2022) for cellulitis f/u .

## 2022-12-28 ENCOUNTER — Ambulatory Visit: Payer: Medicare HMO | Admitting: Podiatry

## 2022-12-28 ENCOUNTER — Encounter: Payer: Self-pay | Admitting: Podiatry

## 2022-12-28 DIAGNOSIS — L03115 Cellulitis of right lower limb: Secondary | ICD-10-CM

## 2022-12-28 MED ORDER — MUPIROCIN 2 % EX OINT: 1.0000 | TOPICAL_OINTMENT | Freq: Every day | CUTANEOUS | 2 refills | Status: AC

## 2022-12-28 NOTE — Progress Notes (Signed)
  Subjective:  Patient ID: Dean Cole, male    DOB: 07-Dec-1950,  MRN: [id number]  Chief Complaint  Patient presents with   Wound Check    "It's better.  That place on his leg started peeling this week."    72 y.o. male presents with the above complaint. History confirmed with patient.  He says it is doing better still has a few scabs  Objective:  Physical Exam: warm, good capillary refill, normal DP and PT pulses, and bilaterally he has mild venous stasis, cellulitis has greatly improved almost fully resolved, he has a small scab on the anterior leg     Radiographs: Multiple views x-ray of right ankle: No acute osseous abnormalities Assessment:   1. Cellulitis of right ankle      Plan:  Patient was evaluated and treated and all questions answered.  Doing much better can complete his antibiotics and should not need further oral therapy.  Rx for mupirocin sent to pharmacy to use on any cuts or scrapes which likely was the causative issue here.  Return to see me as needed he has follow-up scheduled with Dr. Ardelle Anton in October.  Return if symptoms worsen or fail to improve.

## 2023-01-30 ENCOUNTER — Ambulatory Visit: Payer: Medicare HMO | Admitting: Podiatry

## 2023-01-30 DIAGNOSIS — M79675 Pain in left toe(s): Secondary | ICD-10-CM

## 2023-01-30 DIAGNOSIS — E1149 Type 2 diabetes mellitus with other diabetic neurological complication: Secondary | ICD-10-CM | POA: Diagnosis not present

## 2023-01-30 DIAGNOSIS — B351 Tinea unguium: Secondary | ICD-10-CM | POA: Diagnosis not present

## 2023-01-30 DIAGNOSIS — M79674 Pain in right toe(s): Secondary | ICD-10-CM | POA: Diagnosis not present

## 2023-01-30 NOTE — Progress Notes (Signed)
Subjective: No chief complaint on file.    72 year old male presents the office today with his wife for concerns of thick, elongated toes that he cannot trim himself to both feet.  Since I saw him last he came in the office and saw Dr. Lilian Kapur for cellulitis.  This is much improved.  No signs of cellulitis at this time the report.  No fevers or chills.  No open lesions.  Objective: AAO x3 DP/PT pulses palpable bilaterally, CRT less than 3 seconds Nails are hypertrophic, dystrophic, brittle, discolored, elongated 10.  Small nail present on bilateral hallux toenails.  Tenderness nails 1-5 bilaterally. No open lesions or pre-ulcerative lesions are identified today. No evidence of cellulitis today.  There is no open lesions.  There is no drainage or pus. No open lesions. No pain with calf compression, swelling, warmth, erythema  Assessment: 72 year old male symptomatic onychomycosis  Plan: -All treatment options discussed with the patient including all alternatives, risks, complications. -Sharply debrided the nails x10 without any complications or bleeding. Possibly reoccurrence of infection or any open lesions.- -Continue moisturizer as needed. -Daily foot inspection  Vivi Barrack DPM

## 2023-04-02 ENCOUNTER — Encounter: Payer: Self-pay | Admitting: Podiatry

## 2023-04-02 ENCOUNTER — Ambulatory Visit: Payer: Medicare HMO | Admitting: Podiatry

## 2023-04-02 DIAGNOSIS — B351 Tinea unguium: Secondary | ICD-10-CM | POA: Diagnosis not present

## 2023-04-02 DIAGNOSIS — M79674 Pain in right toe(s): Secondary | ICD-10-CM | POA: Diagnosis not present

## 2023-04-02 DIAGNOSIS — E1149 Type 2 diabetes mellitus with other diabetic neurological complication: Secondary | ICD-10-CM | POA: Diagnosis not present

## 2023-04-02 DIAGNOSIS — M79675 Pain in left toe(s): Secondary | ICD-10-CM

## 2023-04-02 NOTE — Progress Notes (Signed)
Subjective: Chief Complaint  Patient presents with   RFC    RM#14 RFC      72 year old male presents the office today with his wife for concerns of thick, elongated toes that he cannot trim himself to both feet.  No open lesions or new concerns. No recurrent cellulitis.   Objective: AAO x3 DP/PT pulses palpable bilaterally, CRT less than 3 seconds Nails are hypertrophic, dystrophic, brittle, discolored, elongated 10.  Small nail present on bilateral hallux toenails.  Tenderness nails 1-5 bilaterally. No open lesions or pre-ulcerative lesions are identified today. No evidence of cellulitis today.  There is no open lesions.  No pain with calf compression, swelling, warmth, erythema  Assessment: 72 year old male symptomatic onychomycosis  Plan: -All treatment options discussed with the patient including all alternatives, risks, complications. -Sharply debrided the nails x10 without any complications or bleeding. -Continue moisturizer as needed. -Daily foot inspection  Vivi Barrack DPM

## 2023-07-02 ENCOUNTER — Encounter: Payer: Self-pay | Admitting: Podiatry

## 2023-07-02 ENCOUNTER — Ambulatory Visit: Payer: Medicare HMO | Admitting: Podiatry

## 2023-07-02 DIAGNOSIS — M79674 Pain in right toe(s): Secondary | ICD-10-CM | POA: Diagnosis not present

## 2023-07-02 DIAGNOSIS — E1149 Type 2 diabetes mellitus with other diabetic neurological complication: Secondary | ICD-10-CM | POA: Diagnosis not present

## 2023-07-02 DIAGNOSIS — B351 Tinea unguium: Secondary | ICD-10-CM | POA: Diagnosis not present

## 2023-07-02 DIAGNOSIS — M79675 Pain in left toe(s): Secondary | ICD-10-CM | POA: Diagnosis not present

## 2023-07-02 MED ORDER — KETOCONAZOLE 2 % EX CREA: 1.0000 | TOPICAL_CREAM | Freq: Every day | CUTANEOUS | 0 refills | Status: DC

## 2023-07-02 NOTE — Progress Notes (Signed)
 Subjective: Chief Complaint  Patient presents with   [facility]    RM#12 [facility] has spots on top of feet would like evaluated.   73 year old male presents the office today with his wife for concerns of thick, elongated toes that he cannot trim himself to both feet.  No open lesions or new concerns. His wife is concerned about an odor that the noticed last night when washing his feet. No open lesions.   Objective: AAO x3 DP/PT pulses palpable bilaterally, CRT less than 3 seconds Nails are hypertrophic, dystrophic, brittle, discolored, elongated 10.  Small nail present on bilateral hallux toenails.  Tenderness nails 1-5 bilaterally. No open lesions or pre-ulcerative lesions are identified today. No evidence of cellulitis today.  There is no open lesions. There is scaly, peeling skin interdigitally.  No pain with calf compression, swelling, warmth, erythema  Assessment: 73 year old male symptomatic onychomycosis  Plan: -All treatment options discussed with the patient including all alternatives, risks, complications. -Sharply debrided the nails x10 without any complications or bleeding. -Prescribed ketoconazole. Continue washing with soap and water daily and changing shoes/socks regularly.  -Daily foot inspection  Vivi Barrack DPM

## 2023-07-08 ENCOUNTER — Other Ambulatory Visit: Payer: Self-pay | Admitting: Podiatry

## 2023-07-22 NOTE — Progress Notes (Unsigned)
  Cardiology Office Note:   Date:  07/25/2023  ID:  Dean Cole, DOB 1950-09-17, MRN [id number] PCP: Barbie Banner, MD  [facility] Providers Cardiologist:  Kristeen Miss, MD {  History of Present Illness:   Dean Cole is a 73 y.o. male who presents for follow up of CAD.  He was seen previously by Dr. Patty Sermons and Donnie Aho.   In March of 2021 he was in the hospital with sepsis of unknown source.  He had no cardiac issues during that admission.   I did refer him to neurology for evaluation of balance and memory.  He was thought to have multifactorial etiologies including med side effects for his memory and balance issues.     Since I last saw him he has continued problems with memory.  He has not had any new cardiovascular complaints that he can recall. The patient denies any new symptoms such as chest discomfort, neck or arm discomfort. There has been no new shortness of breath, PND or orthopnea. There have been no reported palpitations, presyncope or syncope.   He gets around with a cane.  He has some falls.  ROS: As stated in the HPI and negative for all other systems.  Studies Reviewed:    EKG:   NA  Risk Assessment/Calculations:              Physical Exam:   VS:  BP 138/80   Pulse 60   Ht 5\' 10"  (1.778 m)   Wt 175 lb (79.4 kg)   BMI 25.11 kg/m    Wt Readings from Last 3 Encounters:  07/25/23 175 lb (79.4 kg)  09/30/22 172 lb (78 kg)  09/24/22 172 lb (78 kg)     GEN: Well nourished, well developed in no acute distress NECK: No JVD; No carotid bruits CARDIAC: RRR, soft brief apical systolic murmur radiating slightly at the right upper tract, no diastolic murmurs, rubs, gallops RESPIRATORY:  Clear to auscultation without rales, wheezing or rhonchi  ABDOMEN: Soft, non-tender, non-distended EXTREMITIES:  No edema; No deformity   ASSESSMENT AND PLAN:   CAD:   The patient has no new sypmtoms.  No further cardiovascular testing is indicated.  We will  continue with aggressive risk reduction and meds as listed.   DYSLIPIDEMIA:     I have asked him to get a repeat lipid profile have the results sent to me.  Previously had been at target with LDL was in the 50s.  That would be the goal.   HTN: Blood pressures was at target.  No change in therapy.   DM:  A1c was 7.0 managed by his primary provider.   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  They do not recall [place] talking about this but I did talk about this with him last time.  This suggestion has been for conservative management.  I offered to refer them to [facility] again today and they do not want to see the surgeon.  They would rather conservatively manage this.  Follow up with me in 18 months.   Signed, Rollene Rotunda, MD

## 2023-07-25 ENCOUNTER — Encounter: Payer: Self-pay | Admitting: Cardiology

## 2023-07-25 ENCOUNTER — Ambulatory Visit (INDEPENDENT_AMBULATORY_CARE_PROVIDER_SITE_OTHER): Payer: Medicare HMO | Admitting: Cardiology

## 2023-07-25 VITALS — BP 138/80 | HR 60 | Ht 70.0 in | Wt 175.0 lb

## 2023-07-25 DIAGNOSIS — E785 Hyperlipidemia, unspecified: Secondary | ICD-10-CM | POA: Diagnosis not present

## 2023-07-25 DIAGNOSIS — I2583 Coronary atherosclerosis due to lipid rich plaque: Secondary | ICD-10-CM

## 2023-07-25 DIAGNOSIS — I2510 Atherosclerotic heart disease of native coronary artery without angina pectoris: Secondary | ICD-10-CM

## 2023-07-25 DIAGNOSIS — I10 Essential (primary) hypertension: Secondary | ICD-10-CM | POA: Diagnosis not present

## 2023-07-25 NOTE — Patient Instructions (Signed)
 Medication Instructions:  The current medical regimen is effective;  continue present plan and medications.  *If you need a refill on your cardiac medications before your next appointment, please call your pharmacy*  Follow-Up: At [facility], you and your health needs are our priority.  As part of our continuing mission to provide you with exceptional heart care, we have created designated Provider Care Teams.  These Care Teams include your primary Cardiologist (physician) and Advanced Practice Providers (APPs -  Physician Assistants and Nurse Practitioners) who all work together to provide you with the care you need, when you need it.  We recommend signing up for the patient portal called "MyChart".  Sign up information is provided on this After Visit Summary.  MyChart is used to connect with patients for Virtual Visits (Telemedicine).  Patients are able to view lab/test results, encounter notes, upcoming appointments, etc.  Non-urgent messages can be sent to your provider as well.   To learn more about what you can do with MyChart, go to [email or web address].    Your next appointment:   18 month(s)  Provider:   Rollene Rotunda, MD

## 2023-08-06 ENCOUNTER — Ambulatory Visit: Admitting: Podiatry

## 2023-08-06 ENCOUNTER — Encounter: Payer: Self-pay | Admitting: Podiatry

## 2023-08-06 ENCOUNTER — Other Ambulatory Visit (HOSPITAL_COMMUNITY): Payer: Self-pay | Admitting: Nurse Practitioner

## 2023-08-06 DIAGNOSIS — I729 Aneurysm of unspecified site: Secondary | ICD-10-CM

## 2023-08-06 DIAGNOSIS — E1140 Type 2 diabetes mellitus with diabetic neuropathy, unspecified: Secondary | ICD-10-CM

## 2023-08-06 MED ORDER — GABAPENTIN 100 MG PO CAPS: ORAL_CAPSULE | ORAL | 2 refills | Status: DC

## 2023-08-06 NOTE — Progress Notes (Signed)
 Subjective: Chief Complaint  Patient presents with   [facility]    RM#14 [facility]  bil neuropathy   73 year old male presents the office today for concerns of neuropathy.  His wife states that he has been getting more burning recently.  He is on gabapentin 100 mg 3 times a day without any side effects.  No open lesions.  No other concerns today.  Objective: AAO x3, NAD DP/PT pulses palpable bilaterally, CRT less than 3 seconds Sensation decreased with Semmes Weinstein monofilament.  Unable to appreciate any area of pinpoint tenderness there is no open lesions.  Flexor, extensor tendons intact. No pain with calf compression, swelling, warmth, erythema  Assessment: Symptomatic neuropathy  Plan: -All treatment options discussed with the patient including all alternatives, risks, complications.  -We discussed increasing dose of gabapentin as well as side effects of this.  Will increase to 200 mg at nighttime and continue 1 mg in the morning and at lunch.  Continue to monitor for any side effects. -Daily foot inspection. -Patient encouraged to call the office with any questions, concerns, change in symptoms.   Vivi Barrack DPM

## 2023-08-06 NOTE — Patient Instructions (Signed)
 Gabapentin Capsules or Tablets What is this medication? GABAPENTIN (GA ba pen tin) treats nerve pain. It may also be used to prevent and control seizures in people with epilepsy. It works by calming overactive nerves in your body. This medicine may be used for other purposes; ask your health care provider or pharmacist if you have questions. COMMON BRAND NAME(S): Active-PAC with Gabapentin, Ascencion Dike, Gralise, Neurontin What should I tell my care team before I take this medication? They need to know if you have any of these conditions: Kidney disease Lung or breathing disease Substance use disorder Suicidal thoughts, plans, or attempt by you or a family member An unusual or allergic reaction to gabapentin, other medications, foods, dyes, or preservatives Pregnant or trying to get pregnant Breastfeeding How should I use this medication? Take this medication by mouth with a glass of water. Follow the directions on the prescription label. You can take it with or without food. If it upsets your stomach, take it with food. Take your medication at regular intervals. Do not take it more often than directed. Do not stop taking except on your care team's advice. If you are directed to break the 600 or 800 mg tablets in half as part of your dose, the extra half tablet should be used for the next dose. If you have not used the extra half tablet within 28 days, it should be thrown away. A special MedGuide will be given to you by the pharmacist with each prescription and refill. Be sure to read this information carefully each time. Talk to your care team about the use of this medication in children. While this medication may be prescribed for children as young as 3 years for selected conditions, precautions do apply. Overdosage: If you think you have taken too much of this medicine contact a poison control center or emergency room at once. NOTE: This medicine is only for you. Do not share this medicine with  others. What if I miss a dose? If you miss a dose, take it as soon as you can. If it is almost time for your next dose, take only that dose. Do not take double or extra doses. What may interact with this medication? Alcohol Antihistamines for allergy, cough, and cold Certain medications for anxiety or sleep Certain medications for depression like amitriptyline, fluoxetine, sertraline Certain medications for seizures like phenobarbital, primidone Certain medications for stomach problems General anesthetics like halothane, isoflurane, methoxyflurane, propofol Local anesthetics like lidocaine, pramoxine, tetracaine Medications that relax muscles for surgery Opioid medications for pain Phenothiazines like chlorpromazine, mesoridazine, prochlorperazine, thioridazine This list may not describe all possible interactions. Give your health care provider a list of all the medicines, herbs, non-prescription drugs, or dietary supplements you use. Also tell them if you smoke, drink alcohol, or use illegal drugs. Some items may interact with your medicine. What should I watch for while using this medication? Visit your care team for regular checks on your progress. You may want to keep a record at home of how you feel your condition is responding to treatment. You may want to share this information with your care team at each visit. You should contact your care team if your seizures get worse or if you have any new types of seizures. Do not stop taking this medication or any of your seizure medications unless instructed by your care team. Stopping your medication suddenly can increase your seizures or their severity. This medication may cause serious skin reactions. They can happen weeks to  months after starting the medication. Contact your care team right away if you notice fevers or flu-like symptoms with a rash. The rash may be red or purple and then turn into blisters or peeling of the skin. Or, you might  notice a red rash with swelling of the face, lips or lymph nodes in your neck or under your arms. Wear a medical identification bracelet or chain if you are taking this medication for seizures. Carry a card that lists all your medications. This medication may affect your coordination, reaction time, or judgment. Do not drive or operate machinery until you know how this medication affects you. Sit up or stand slowly to reduce the risk of dizzy or fainting spells. Drinking alcohol with this medication can increase the risk of these side effects. Your mouth may get dry. Chewing sugarless gum or sucking hard candy, and drinking plenty of water may help. Watch for new or worsening thoughts of suicide or depression. This includes sudden changes in mood, behaviors, or thoughts. These changes can happen at any time but are more common in the beginning of treatment or after a change in dose. Call your care team right away if you experience these thoughts or worsening depression. If you become pregnant while using this medication, you may enroll in the [place] [employer] Antiepileptic Drug Pregnancy Registry by calling [phone]. This registry collects information about the safety of antiepileptic medication use during pregnancy. What side effects may I notice from receiving this medication? Side effects that you should report to your care team as soon as possible: Allergic reactions or angioedema--skin rash, itching, hives, swelling of the face, eyes, lips, tongue, arms, or legs, trouble swallowing or breathing Rash, fever, and swollen lymph nodes Thoughts of suicide or self harm, worsening mood, feelings of depression Trouble breathing Unusual changes in mood or behavior in children after use such as difficulty concentrating, hostility, or restlessness Side effects that usually do not require medical attention (report to your care team if they continue or are  bothersome): Dizziness Drowsiness Nausea Swelling of ankles, feet, or hands Vomiting This list may not describe all possible side effects. Call your doctor for medical advice about side effects. You may report side effects to FDA at 1-800-FDA-1088. Where should I keep my medication? Keep out of reach of children and pets. Store at room temperature between 15 and 30 degrees C (59 and 86 degrees F). Get rid of any unused medication after the expiration date. This medication may cause accidental overdose and death if taken by other adults, children, or pets. To get rid of medications that are no longer needed or have expired: Take the medication to a medication take-back program. Check with your pharmacy or law enforcement to find a location. If you cannot return the medication, check the label or package insert to see if the medication should be thrown out in the garbage or flushed down the toilet. If you are not sure, ask your care team. If it is safe to put it in the trash, empty the medication out of the container. Mix the medication with cat litter, dirt, coffee grounds, or other unwanted substance. Seal the mixture in a bag or container. Put it in the trash. NOTE: This sheet is a summary. It may not cover all possible information. If you have questions about this medicine, talk to your doctor, pharmacist, or health care provider.  2024 Elsevier/Gold Standard (2022-01-31 00:00:00)

## 2023-09-14 ENCOUNTER — Encounter (HOSPITAL_COMMUNITY): Payer: Self-pay

## 2023-09-14 ENCOUNTER — Ambulatory Visit (HOSPITAL_COMMUNITY)

## 2023-10-02 ENCOUNTER — Ambulatory Visit: Admitting: Podiatry

## 2023-10-02 ENCOUNTER — Encounter: Payer: Self-pay | Admitting: Podiatry

## 2023-10-02 DIAGNOSIS — B351 Tinea unguium: Secondary | ICD-10-CM

## 2023-10-02 DIAGNOSIS — M7989 Other specified soft tissue disorders: Secondary | ICD-10-CM | POA: Diagnosis not present

## 2023-10-02 DIAGNOSIS — E1140 Type 2 diabetes mellitus with diabetic neuropathy, unspecified: Secondary | ICD-10-CM | POA: Diagnosis not present

## 2023-10-02 DIAGNOSIS — M79675 Pain in left toe(s): Secondary | ICD-10-CM

## 2023-10-02 DIAGNOSIS — M79674 Pain in right toe(s): Secondary | ICD-10-CM

## 2023-10-02 NOTE — Progress Notes (Signed)
 Subjective: Chief Complaint  Patient presents with   [facility]     RM#[id number] DFC    73 year old male presents the office today for thick, elongated nails he is not able to trim himself.    He is on gabapentin  100 mg 3 times a day without any side effects.  No open lesions.    His wife is concerned about possible swelling to the left leg. No pain.   Tura Gaines, MD   Objective: AAO x3, NAD DP/PT pulses palpable bilaterally, CRT less than 3 seconds Sensation decreased with Semmes Weinstein monofilament.  Nails are hypertrophic, dystrophic, brittle, discolored, elongated 10. No surrounding redness or drainage. Tenderness nails 1-5 bilaterally. No open lesions or pre-ulcerative lesions are identified today. There is no swelling to the calf, they measure equal sizes. The calf is supple, no erythema or pain.  No pain with calf compression, swelling, warmth, erythema  Assessment: Symptomatic onychomycosis; neuropathy; ? Swelling   Plan: Symptomatic onychomycosis -Sharply debrided nails x 8 with any complications or bleeding.  Neuropathy - He is in continue with gabapentin . Doing well without side affects. Does not take a regularly as he forgets at times.  Swelling left leg -Not able to appreciate this today.  I did measure them today and they are equal in size.  Calf is supple, no erythema or any signs of DVT otherwise.  Monitor for any worsening or changes.    Charity Conch DPM

## 2023-12-02 ENCOUNTER — Other Ambulatory Visit: Payer: Self-pay | Admitting: Podiatry

## 2023-12-03 ENCOUNTER — Ambulatory Visit: Admitting: Podiatry

## 2023-12-06 ENCOUNTER — Encounter: Payer: Self-pay | Admitting: Podiatry

## 2023-12-06 ENCOUNTER — Ambulatory Visit: Admitting: Podiatry

## 2023-12-06 VITALS — Ht 70.0 in | Wt 175.0 lb

## 2023-12-06 DIAGNOSIS — E1140 Type 2 diabetes mellitus with diabetic neuropathy, unspecified: Secondary | ICD-10-CM

## 2023-12-06 DIAGNOSIS — B351 Tinea unguium: Secondary | ICD-10-CM | POA: Diagnosis not present

## 2023-12-06 DIAGNOSIS — M79675 Pain in left toe(s): Secondary | ICD-10-CM

## 2023-12-06 DIAGNOSIS — M79674 Pain in right toe(s): Secondary | ICD-10-CM

## 2023-12-06 NOTE — Progress Notes (Signed)
 Subjective: Chief Complaint  Patient presents with   Nail Problem    Pt is here for [facility].   73 year old male presents the office today for thick, elongated nails he is not able to trim himself.    He did have some blisters to his feet, but they have resolved.   PCP: Tanda Prentice DEL, MD   Objective: AAO x3, NAD DP/PT pulses palpable bilaterally, CRT less than 3 seconds Sensation decreased with Semmes Weinstein monofilament.  Nails are hypertrophic, dystrophic, brittle, discolored, elongated 10. No surrounding redness or drainage. Tenderness nails 1-5 bilaterally. No open lesions or pre-ulcerative lesions are identified today. No ulcer formation noted today. No pain with calf compression, swelling, warmth, erythema  Assessment: Symptomatic onychomycosis; neuropathy  Plan: Symptomatic onychomycosis -Sharply debrided nails x 8 with any complications or bleeding.  Neuropathy - He is in continue with gabapentin . Doing well without side affects. Does not take a regularly as he forgets at times.  Return in about 3 months (around 03/07/2024).  [name] DPM

## 2024-01-30 ENCOUNTER — Other Ambulatory Visit (HOSPITAL_COMMUNITY): Payer: Self-pay | Admitting: Internal Medicine

## 2024-01-30 DIAGNOSIS — I720 Aneurysm of carotid artery: Secondary | ICD-10-CM

## 2024-02-04 ENCOUNTER — Ambulatory Visit (HOSPITAL_COMMUNITY)
Admission: RE | Admit: 2024-02-04 | Discharge: 2024-02-04 | Disposition: A | Discharge status: Home / Self Care | Source: Ambulatory Visit

## 2024-02-04 ENCOUNTER — Ambulatory Visit (HOSPITAL_COMMUNITY)
Admission: RE | Admit: 2024-02-04 | Discharge: 2024-02-04 | Disposition: A | Discharge status: Home / Self Care | Source: Ambulatory Visit | Attending: Internal Medicine | Admitting: Internal Medicine

## 2024-02-04 ENCOUNTER — Other Ambulatory Visit (HOSPITAL_COMMUNITY): Payer: Self-pay

## 2024-02-04 DIAGNOSIS — M545 Low back pain, unspecified: Secondary | ICD-10-CM | POA: Insufficient documentation

## 2024-02-04 DIAGNOSIS — I720 Aneurysm of carotid artery: Secondary | ICD-10-CM | POA: Diagnosis present

## 2024-03-10 ENCOUNTER — Encounter: Payer: Self-pay | Admitting: Podiatry

## 2024-03-10 ENCOUNTER — Ambulatory Visit: Admitting: Podiatry

## 2024-03-10 DIAGNOSIS — E1149 Type 2 diabetes mellitus with other diabetic neurological complication: Secondary | ICD-10-CM

## 2024-03-10 DIAGNOSIS — M79675 Pain in left toe(s): Secondary | ICD-10-CM | POA: Diagnosis not present

## 2024-03-10 DIAGNOSIS — B351 Tinea unguium: Secondary | ICD-10-CM

## 2024-03-10 DIAGNOSIS — M79674 Pain in right toe(s): Secondary | ICD-10-CM

## 2024-03-10 NOTE — Progress Notes (Signed)
 Subjective: Chief Complaint  Patient presents with   [facility]    Rm13 Diabetic foot care/ A83c 6    73 year old male presents the office today for thick, elongated nails he is not able to trim himself.  No ulcerations or new concerns today.   PCP: Renato Dorothey HERO, NP   Objective: AAO x3, NAD DP/PT pulses palpable bilaterally, CRT less than 3 seconds Sensation decreased with Semmes Weinstein monofilament.  Nails are hypertrophic, dystrophic, brittle, discolored, elongated 10. No surrounding redness or drainage. Tenderness nails 1-5 bilaterally. No open lesions or pre-ulcerative lesions are identified today. No ulcer formation noted today. No pain with calf compression, swelling, warmth, erythema  Assessment: Symptomatic onychomycosis; neuropathy  Plan: Symptomatic onychomycosis -Sharply debrided nails x 8 with any complications or bleeding.  Neuropathy - He is in continue with gabapentin . Doing well without side affects. Continue to monitor.   Return in about 3 months (around 06/10/2024).  Dean Cole DPM

## 2024-04-08 ENCOUNTER — Telehealth: Payer: Self-pay

## 2024-04-08 NOTE — Telephone Encounter (Signed)
 Patient's wife called regarding patient's toe. Patient's wife states that patient had a blister on the little toe that he pulled the skin off of. She states that she cleaned it up, put ointment on it and a bandage. I informed her to just keep using the ointment and keeping it bandage when patient is up walking. I infomred her to just keep an eye on it and if it doesn't seem like it is healing or getting worse to call and schedule an appointment.

## 2024-04-09 NOTE — Telephone Encounter (Signed)
 Patient's wife called back again today - reports that the toe is getting worse - red, swollen, looks infected - Patient needs to be seen  - can we work him in tomorrow, 04/10/24? Or schedule him for late clinic -please call to schedule. Thanks [phone]

## 2024-04-10 ENCOUNTER — Ambulatory Visit: Admitting: Podiatry

## 2024-04-10 ENCOUNTER — Ambulatory Visit

## 2024-04-10 VITALS — BP 130/71 | HR 84 | Temp 97.2°F

## 2024-04-10 DIAGNOSIS — M79674 Pain in right toe(s): Secondary | ICD-10-CM

## 2024-04-10 DIAGNOSIS — L03116 Cellulitis of left lower limb: Secondary | ICD-10-CM

## 2024-04-10 DIAGNOSIS — L97521 Non-pressure chronic ulcer of other part of left foot limited to breakdown of skin: Secondary | ICD-10-CM

## 2024-04-10 DIAGNOSIS — M79675 Pain in left toe(s): Secondary | ICD-10-CM

## 2024-04-10 MED ORDER — CEPHALEXIN 500 MG PO CAPS: 500.0000 mg | ORAL_CAPSULE | Freq: Three times a day (TID) | ORAL | 2 refills | Status: AC

## 2024-04-10 MED ORDER — MUPIROCIN 2 % EX OINT: 1.0000 | TOPICAL_OINTMENT | Freq: Two times a day (BID) | CUTANEOUS | 2 refills | Status: AC

## 2024-04-10 NOTE — Progress Notes (Signed)
 Subjective: Chief Complaint  Patient presents with   Foot Ulcer    Rm14 Wound left 5th toe that started as a blister and pt peeled skin off.   73 year old male presents the office with his wife for concerns of an ulcer to the left fifth toe.  He states it started as a blister.  Is wearing shoes that were too tight and he peeled the skin off himself has had bleeding since then.  This occurred over the last couple of days.  Does not endorse any fevers or chills.  States his blood sugars have been in the 400s.  Last A1c was over 10.  Objective: NAD DP/PT pulses palpable bilaterally, CRT less than 3 seconds Sensation decreased On the left fifth toe epidermal lysis is noted, the blister came off and there is localized edema and erythema to the forefoot without any ascending cellulitis of the ankle.  No fluctuation or crepitation. No pain with calf compression, swelling, warmth, erythema     Assessment: Ulceration left fifth toe with cellulitis with uncontrolled diabetes  Plan: -All treatment options discussed with the patient including all alternatives, risks, complications.  -X-rays obtained reviewed.  Multiple views obtained.  No evidence of acute fracture.  No cortical changes to suggest osteomyelitis.  No soft tissue edema. -Prescribed cephalexin .  Mupirocin  ointment dressing changes daily. -Surgical shoe for offloading -Glucose control -Monitor for any clinical signs or symptoms of infection and directed to call the office immediately should any occur or go to the ER. -Patient encouraged to call the office with any questions, concerns, change in symptoms.   Return for left foot infection in 7-10 days.  [name] DPM

## 2024-04-21 ENCOUNTER — Ambulatory Visit: Admitting: Podiatry

## 2024-04-21 ENCOUNTER — Encounter: Payer: Self-pay | Admitting: Podiatry

## 2024-04-21 VITALS — BP 126/61 | Temp 96.6°F | Resp 58

## 2024-04-21 DIAGNOSIS — L97521 Non-pressure chronic ulcer of other part of left foot limited to breakdown of skin: Secondary | ICD-10-CM | POA: Diagnosis not present

## 2024-04-22 NOTE — Progress Notes (Signed)
 Subjective: Chief Complaint  Patient presents with   Wound Check    F/u on Wound left 5th toe     73 year old male presents the office with his wife for concerns of an ulcer to the left fifth toe.  States that overall he is doing much better and the wound appears to be healed.  Denies any drainage or pus or any new open lesions.  Does not Dors any fevers or chills and he has no other concerns today.    Objective: NAD-presents with wife DP/PT pulses palpable bilaterally, CRT less than 3 seconds Sensation decreased Dry skin, callus formation to the left fifth toe without any underlying ulceration or drainage or signs of infection.  Minimal edema still remains but overall improved.  No cellulitis identified there is no increase in temperature.  No pain on exam although he does have neuropathy. No pain with calf compression, swelling, warmth, erythema     Assessment: Ulceration left fifth toe with cellulitis with uncontrolled diabetes  Plan: -All treatment options discussed with the patient including all alternatives, risks, complications.  -Debrided the loose hyperkeratotic tissue with any complications.  I keep a bandage on the area more to prevent him from picking at the skin.  Continue offloading and monitor closely for any signs or symptoms of infection and or reoccurrence of the wound.  Discussed moisturizer at nighttime but not interdigitally.  No follow-ups on file.  Dean Cole DPM

## 2024-05-06 ENCOUNTER — Other Ambulatory Visit: Payer: Self-pay | Admitting: Podiatry

## 2024-05-19 ENCOUNTER — Other Ambulatory Visit: Payer: Self-pay | Admitting: Podiatry

## 2024-06-10 ENCOUNTER — Ambulatory Visit: Admitting: Podiatry
